# Patient Record
Sex: Male | Born: 1945
Health system: Southern US, Community
[De-identification: ages and names within clinical notes are randomized; demographics above are authoritative.]

## PROBLEM LIST (undated history)

## (undated) DIAGNOSIS — E119 Type 2 diabetes mellitus without complications: Secondary | ICD-10-CM

## (undated) DIAGNOSIS — T7840XA Allergy, unspecified, initial encounter: Secondary | ICD-10-CM

## (undated) DIAGNOSIS — F419 Anxiety disorder, unspecified: Secondary | ICD-10-CM

## (undated) DIAGNOSIS — K635 Polyp of colon: Secondary | ICD-10-CM

## (undated) DIAGNOSIS — H269 Unspecified cataract: Secondary | ICD-10-CM

## (undated) DIAGNOSIS — R42 Dizziness and giddiness: Secondary | ICD-10-CM

## (undated) DIAGNOSIS — R0789 Other chest pain: Secondary | ICD-10-CM

## (undated) DIAGNOSIS — N189 Chronic kidney disease, unspecified: Secondary | ICD-10-CM

## (undated) DIAGNOSIS — Z87898 Personal history of other specified conditions: Secondary | ICD-10-CM

## (undated) DIAGNOSIS — K219 Gastro-esophageal reflux disease without esophagitis: Secondary | ICD-10-CM

## (undated) DIAGNOSIS — L409 Psoriasis, unspecified: Secondary | ICD-10-CM

## (undated) DIAGNOSIS — M1990 Unspecified osteoarthritis, unspecified site: Secondary | ICD-10-CM

## (undated) DIAGNOSIS — E7800 Pure hypercholesterolemia, unspecified: Secondary | ICD-10-CM

## (undated) HISTORY — DX: Type 2 diabetes mellitus without complications: E11.9

## (undated) HISTORY — DX: Personal history of other specified conditions: Z87.898

## (undated) HISTORY — DX: Other chest pain: R07.89

## (undated) HISTORY — DX: Allergy, unspecified, initial encounter: T78.40XA

## (undated) HISTORY — DX: Polyp of colon: K63.5

## (undated) HISTORY — DX: Psoriasis, unspecified: L40.9

## (undated) HISTORY — DX: Chronic kidney disease, unspecified: N18.9

## (undated) HISTORY — PX: SINOSCOPY: SHX187

## (undated) HISTORY — DX: Pure hypercholesterolemia, unspecified: E78.00

## (undated) HISTORY — DX: Gastro-esophageal reflux disease without esophagitis: K21.9

## (undated) HISTORY — DX: Unspecified cataract: H26.9

## (undated) HISTORY — DX: Unspecified osteoarthritis, unspecified site: M19.90

## (undated) HISTORY — DX: Anxiety disorder, unspecified: F41.9

## (undated) HISTORY — PX: TONSILLECTOMY: SUR1361

## (undated) HISTORY — PX: COLONOSCOPY: SHX174

## (undated) HISTORY — PX: CATARACT EXTRACTION, BILATERAL: SHX1313

---
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
## 1987-09-01 HISTORY — PX: NASAL SEPTUM SURGERY: SHX37

## 1987-09-01 HISTORY — PX: INGUINAL HERNIA REPAIR: SUR1180

## 1999-03-11 ENCOUNTER — Encounter: Payer: Self-pay | Admitting: Pulmonary Disease

## 1999-03-11 ENCOUNTER — Ambulatory Visit (HOSPITAL_COMMUNITY): Admission: RE | Admit: 1999-03-11 | Discharge: 1999-03-11 | Payer: Self-pay | Admitting: Pulmonary Disease

## 2004-09-10 ENCOUNTER — Ambulatory Visit: Payer: Self-pay | Admitting: Pulmonary Disease

## 2005-02-11 ENCOUNTER — Ambulatory Visit: Payer: Self-pay | Admitting: Pulmonary Disease

## 2005-05-22 ENCOUNTER — Ambulatory Visit: Payer: Self-pay | Admitting: Pulmonary Disease

## 2005-09-09 ENCOUNTER — Ambulatory Visit: Payer: Self-pay | Admitting: Pulmonary Disease

## 2006-03-09 ENCOUNTER — Ambulatory Visit: Payer: Self-pay | Admitting: Pulmonary Disease

## 2006-09-02 ENCOUNTER — Ambulatory Visit: Payer: Self-pay | Admitting: Pulmonary Disease

## 2006-10-04 ENCOUNTER — Ambulatory Visit: Payer: Self-pay | Admitting: Pulmonary Disease

## 2006-10-04 LAB — CONVERTED CEMR LAB
ALT: 16 units/L (ref 0–40)
AST: 16 units/L (ref 0–37)
Albumin: 4.3 g/dL (ref 3.5–5.2)
Alkaline Phosphatase: 47 units/L (ref 39–117)
BUN: 21 mg/dL (ref 6–23)
Basophils Absolute: 0.1 10*3/uL (ref 0.0–0.1)
Basophils Relative: 1.2 % — ABNORMAL HIGH (ref 0.0–1.0)
Bilirubin Urine: NEGATIVE
Bilirubin, Direct: 0.1 mg/dL (ref 0.0–0.3)
CO2: 32 meq/L (ref 19–32)
Calcium: 9.5 mg/dL (ref 8.4–10.5)
Chloride: 107 meq/L (ref 96–112)
Cholesterol: 173 mg/dL (ref 0–200)
Creatinine, Ser: 0.8 mg/dL (ref 0.4–1.5)
Creatinine,U: 185.8 mg/dL
Eosinophils Absolute: 0.3 10*3/uL (ref 0.0–0.6)
Eosinophils Relative: 4.9 % (ref 0.0–5.0)
GFR calc Af Amer: 128 mL/min
GFR calc non Af Amer: 106 mL/min
Glucose, Bld: 131 mg/dL — ABNORMAL HIGH (ref 70–99)
HCT: 41.2 % (ref 39.0–52.0)
HDL: 48 mg/dL (ref >39.0)
Hemoglobin: 14.5 g/dL (ref 13.0–17.0)
Hgb A1c MFr Bld: 7 % — ABNORMAL HIGH (ref 4.6–6.0)
Ketones, ur: NEGATIVE mg/dL
LDL Cholesterol: 107 mg/dL — ABNORMAL HIGH (ref 0–99)
Leukocytes, UA: NEGATIVE
Lymphocytes Relative: 17.7 % (ref 12.0–46.0)
MCHC: 35.1 g/dL (ref 30.0–36.0)
MCV: 83.5 fL (ref 78.0–100.0)
Microalb Creat Ratio: 8.1 mg/g (ref 0.0–30.0)
Microalb, Ur: 1.5 mg/dL (ref 0.0–1.9)
Monocytes Absolute: 0.4 10*3/uL (ref 0.2–0.7)
Monocytes Relative: 7.2 % (ref 3.0–11.0)
Neutro Abs: 4.1 10*3/uL (ref 1.4–7.7)
Neutrophils Relative %: 69 % (ref 43.0–77.0)
Nitrite: NEGATIVE
PSA: 0.5 ng/mL (ref 0.10–4.00)
Platelets: 204 10*3/uL (ref 150–400)
Potassium: 4.2 meq/L (ref 3.5–5.1)
RBC: 4.94 M/uL (ref 4.22–5.81)
RDW: 13.6 % (ref 11.5–14.6)
Sodium: 142 meq/L (ref 135–145)
Specific Gravity, Urine: >=1.03 (ref 1.000–1.03)
TSH: 0.89 microintl units/mL (ref 0.35–5.50)
Total Bilirubin: 0.6 mg/dL (ref 0.3–1.2)
Total CHOL/HDL Ratio: 3.6
Total Protein, Urine: NEGATIVE mg/dL
Total Protein: 7.4 g/dL (ref 6.0–8.3)
Triglycerides: 89 mg/dL (ref 0–149)
Urine Glucose: NEGATIVE mg/dL
Urobilinogen, UA: 0.2 (ref 0.0–1.0)
VLDL: 18 mg/dL (ref 0–40)
WBC: 6 10*3/uL (ref 4.5–10.5)
pH: 6 (ref 5.0–8.0)

## 2007-10-28 ENCOUNTER — Encounter: Payer: Self-pay | Admitting: Pulmonary Disease

## 2008-02-23 DIAGNOSIS — Z860100 Personal history of colon polyps, unspecified: Secondary | ICD-10-CM | POA: Insufficient documentation

## 2008-02-23 DIAGNOSIS — E1165 Type 2 diabetes mellitus with hyperglycemia: Secondary | ICD-10-CM | POA: Insufficient documentation

## 2008-02-23 DIAGNOSIS — E1129 Type 2 diabetes mellitus with other diabetic kidney complication: Secondary | ICD-10-CM | POA: Insufficient documentation

## 2008-02-23 DIAGNOSIS — M1990 Unspecified osteoarthritis, unspecified site: Secondary | ICD-10-CM | POA: Insufficient documentation

## 2008-02-23 DIAGNOSIS — K219 Gastro-esophageal reflux disease without esophagitis: Secondary | ICD-10-CM | POA: Insufficient documentation

## 2008-02-23 DIAGNOSIS — F411 Generalized anxiety disorder: Secondary | ICD-10-CM | POA: Insufficient documentation

## 2008-02-23 DIAGNOSIS — L408 Other psoriasis: Secondary | ICD-10-CM | POA: Insufficient documentation

## 2008-02-24 ENCOUNTER — Ambulatory Visit: Payer: Self-pay | Admitting: Pulmonary Disease

## 2008-02-24 DIAGNOSIS — E7800 Pure hypercholesterolemia, unspecified: Secondary | ICD-10-CM | POA: Insufficient documentation

## 2008-02-25 DIAGNOSIS — R51 Headache: Secondary | ICD-10-CM | POA: Insufficient documentation

## 2008-02-25 DIAGNOSIS — R519 Headache, unspecified: Secondary | ICD-10-CM | POA: Insufficient documentation

## 2008-02-25 DIAGNOSIS — R0789 Other chest pain: Secondary | ICD-10-CM | POA: Insufficient documentation

## 2008-02-25 LAB — CONVERTED CEMR LAB
ALT: 15 units/L (ref 0–53)
AST: 17 units/L (ref 0–37)
Albumin: 4.3 g/dL (ref 3.5–5.2)
Alkaline Phosphatase: 53 units/L (ref 39–117)
BUN: 19 mg/dL (ref 6–23)
Basophils Absolute: 0.1 10*3/uL (ref 0.0–0.1)
Basophils Relative: 0.8 % (ref 0.0–1.0)
Bilirubin Urine: NEGATIVE
Bilirubin, Direct: 0.1 mg/dL (ref 0.0–0.3)
CO2: 27 meq/L (ref 19–32)
Calcium: 9.4 mg/dL (ref 8.4–10.5)
Chloride: 101 meq/L (ref 96–112)
Cholesterol: 143 mg/dL (ref 0–200)
Creatinine, Ser: 0.9 mg/dL (ref 0.4–1.5)
Creatinine,U: 239.4 mg/dL
Crystals: NEGATIVE
Eosinophils Absolute: 0.3 10*3/uL (ref 0.0–0.7)
Eosinophils Relative: 3.8 % (ref 0.0–5.0)
GFR calc Af Amer: 111 mL/min
GFR calc non Af Amer: 91 mL/min
Glucose, Bld: 116 mg/dL — ABNORMAL HIGH (ref 70–99)
HCT: 37.8 % — ABNORMAL LOW (ref 39.0–52.0)
HDL: 46.2 mg/dL (ref >39.0)
Hemoglobin, Urine: NEGATIVE
Hemoglobin: 13.2 g/dL (ref 13.0–17.0)
Hgb A1c MFr Bld: 7.8 % — ABNORMAL HIGH (ref 4.6–6.0)
LDL Cholesterol: 81 mg/dL (ref 0–99)
Leukocytes, UA: NEGATIVE
Lymphocytes Relative: 17.4 % (ref 12.0–46.0)
MCHC: 35.1 g/dL (ref 30.0–36.0)
MCV: 83.4 fL (ref 78.0–100.0)
Microalb Creat Ratio: 6.7 mg/g (ref 0.0–30.0)
Microalb, Ur: 1.6 mg/dL (ref 0.0–1.9)
Monocytes Absolute: 0.5 10*3/uL (ref 0.1–1.0)
Monocytes Relative: 6.8 % (ref 3.0–12.0)
Neutro Abs: 5 10*3/uL (ref 1.4–7.7)
Neutrophils Relative %: 71.2 % (ref 43.0–77.0)
Nitrite: NEGATIVE
PSA: 0.61 ng/mL (ref 0.10–4.00)
Platelets: 189 10*3/uL (ref 150–400)
Potassium: 4 meq/L (ref 3.5–5.1)
RBC: 4.53 M/uL (ref 4.22–5.81)
RDW: 13.3 % (ref 11.5–14.6)
Sodium: 137 meq/L (ref 135–145)
Specific Gravity, Urine: 1.025 (ref 1.000–1.03)
TSH: 0.77 microintl units/mL (ref 0.35–5.50)
Total Bilirubin: 0.9 mg/dL (ref 0.3–1.2)
Total CHOL/HDL Ratio: 3.1
Total Protein: 7.4 g/dL (ref 6.0–8.3)
Triglycerides: 78 mg/dL (ref 0–149)
Urine Glucose: NEGATIVE mg/dL
Urobilinogen, UA: 1 (ref 0.0–1.0)
VLDL: 16 mg/dL (ref 0–40)
WBC: 7.1 10*3/uL (ref 4.5–10.5)
pH: 6.5 (ref 5.0–8.0)

## 2008-05-28 ENCOUNTER — Ambulatory Visit: Payer: Self-pay | Admitting: Pulmonary Disease

## 2008-05-29 LAB — CONVERTED CEMR LAB
BUN: 20 mg/dL (ref 6–23)
CO2: 32 meq/L (ref 19–32)
Calcium: 9.1 mg/dL (ref 8.4–10.5)
Chloride: 112 meq/L (ref 96–112)
Creatinine, Ser: 0.9 mg/dL (ref 0.4–1.5)
GFR calc Af Amer: 111 mL/min
GFR calc non Af Amer: 91 mL/min
Glucose, Bld: 111 mg/dL — ABNORMAL HIGH (ref 70–99)
Hgb A1c MFr Bld: 7.3 % — ABNORMAL HIGH (ref 4.6–6.0)
Potassium: 4.2 meq/L (ref 3.5–5.1)
Sodium: 144 meq/L (ref 135–145)

## 2008-10-03 ENCOUNTER — Ambulatory Visit: Payer: Self-pay | Admitting: Pulmonary Disease

## 2008-10-03 LAB — CONVERTED CEMR LAB
ALT: 16 units/L (ref 0–53)
AST: 18 units/L (ref 0–37)
Albumin: 4.4 g/dL (ref 3.5–5.2)
Alkaline Phosphatase: 40 units/L (ref 39–117)
BUN: 18 mg/dL (ref 6–23)
Bilirubin, Direct: 0.1 mg/dL (ref 0.0–0.3)
CO2: 29 meq/L (ref 19–32)
Calcium: 9.6 mg/dL (ref 8.4–10.5)
Chloride: 102 meq/L (ref 96–112)
Cholesterol: 153 mg/dL (ref 0–200)
Creatinine, Ser: 1 mg/dL (ref 0.4–1.5)
GFR calc Af Amer: 98 mL/min
GFR calc non Af Amer: 81 mL/min
Glucose, Bld: 114 mg/dL — ABNORMAL HIGH (ref 70–99)
HDL: 49.1 mg/dL (ref >39.0)
Hgb A1c MFr Bld: 7.1 % — ABNORMAL HIGH (ref 4.6–6.0)
LDL Cholesterol: 87 mg/dL (ref 0–99)
Potassium: 4.2 meq/L (ref 3.5–5.1)
Sodium: 139 meq/L (ref 135–145)
Total Bilirubin: 0.7 mg/dL (ref 0.3–1.2)
Total CHOL/HDL Ratio: 3.1
Total Protein: 7.5 g/dL (ref 6.0–8.3)
Triglycerides: 84 mg/dL (ref 0–149)
VLDL: 17 mg/dL (ref 0–40)

## 2008-11-16 ENCOUNTER — Encounter: Payer: Self-pay | Admitting: Pulmonary Disease

## 2009-04-01 ENCOUNTER — Ambulatory Visit: Payer: Self-pay | Admitting: Pulmonary Disease

## 2009-04-12 LAB — CONVERTED CEMR LAB
ALT: 14 units/L (ref 0–53)
AST: 14 units/L (ref 0–37)
Albumin: 4.2 g/dL (ref 3.5–5.2)
Alkaline Phosphatase: 47 units/L (ref 39–117)
BUN: 25 mg/dL — ABNORMAL HIGH (ref 6–23)
Basophils Absolute: 0.1 10*3/uL (ref 0.0–0.1)
Basophils Relative: 0.8 % (ref 0.0–3.0)
Bilirubin, Direct: 0.1 mg/dL (ref 0.0–0.3)
CO2: 30 meq/L (ref 19–32)
Calcium: 9.5 mg/dL (ref 8.4–10.5)
Chloride: 106 meq/L (ref 96–112)
Cholesterol: 174 mg/dL (ref 0–200)
Creatinine, Ser: 0.9 mg/dL (ref 0.4–1.5)
Creatinine,U: 160.5 mg/dL
Eosinophils Absolute: 0.3 10*3/uL (ref 0.0–0.7)
Eosinophils Relative: 4.4 % (ref 0.0–5.0)
GFR calc non Af Amer: 91.11 mL/min (ref >60)
Glucose, Bld: 145 mg/dL — ABNORMAL HIGH (ref 70–99)
HCT: 36.5 % — ABNORMAL LOW (ref 39.0–52.0)
HDL: 44.9 mg/dL (ref >39.00)
Hemoglobin: 12.9 g/dL — ABNORMAL LOW (ref 13.0–17.0)
Hgb A1c MFr Bld: 7.6 % — ABNORMAL HIGH (ref 4.6–6.5)
LDL Cholesterol: 113 mg/dL — ABNORMAL HIGH (ref 0–99)
Lymphocytes Relative: 18.3 % (ref 12.0–46.0)
Lymphs Abs: 1.3 10*3/uL (ref 0.7–4.0)
MCHC: 35.2 g/dL (ref 30.0–36.0)
MCV: 84.4 fL (ref 78.0–100.0)
Microalb Creat Ratio: 9.3 mg/g (ref 0.0–30.0)
Microalb, Ur: 1.5 mg/dL (ref 0.0–1.9)
Monocytes Absolute: 0.5 10*3/uL (ref 0.1–1.0)
Monocytes Relative: 6.8 % (ref 3.0–12.0)
Neutro Abs: 4.8 10*3/uL (ref 1.4–7.7)
Neutrophils Relative %: 69.7 % (ref 43.0–77.0)
PSA: 0.73 ng/mL (ref 0.10–4.00)
Platelets: 197 10*3/uL (ref 150.0–400.0)
Potassium: 4.5 meq/L (ref 3.5–5.1)
RBC: 4.32 M/uL (ref 4.22–5.81)
RDW: 13.6 % (ref 11.5–14.6)
Sodium: 144 meq/L (ref 135–145)
TSH: 0.66 microintl units/mL (ref 0.35–5.50)
Total Bilirubin: 0.7 mg/dL (ref 0.3–1.2)
Total CHOL/HDL Ratio: 4
Total Protein: 7.7 g/dL (ref 6.0–8.3)
Triglycerides: 83 mg/dL (ref 0.0–149.0)
VLDL: 16.6 mg/dL (ref 0.0–40.0)
WBC: 7 10*3/uL (ref 4.5–10.5)

## 2009-05-29 ENCOUNTER — Encounter (INDEPENDENT_AMBULATORY_CARE_PROVIDER_SITE_OTHER): Payer: Self-pay | Admitting: *Deleted

## 2009-08-16 ENCOUNTER — Encounter: Payer: Self-pay | Admitting: Pulmonary Disease

## 2009-09-20 ENCOUNTER — Ambulatory Visit: Payer: Self-pay | Admitting: Pulmonary Disease

## 2009-09-21 LAB — CONVERTED CEMR LAB
BUN: 19 mg/dL (ref 6–23)
CO2: 29 meq/L (ref 19–32)
Calcium: 9.4 mg/dL (ref 8.4–10.5)
Chloride: 104 meq/L (ref 96–112)
Cholesterol: 150 mg/dL (ref 0–200)
Creatinine, Ser: 1 mg/dL (ref 0.4–1.5)
GFR calc non Af Amer: 80.56 mL/min (ref >60)
Glucose, Bld: 116 mg/dL — ABNORMAL HIGH (ref 70–99)
HDL: 46.3 mg/dL (ref >39.00)
Hgb A1c MFr Bld: 7.4 % — ABNORMAL HIGH (ref 4.6–6.5)
LDL Cholesterol: 85 mg/dL (ref 0–99)
Potassium: 4.2 meq/L (ref 3.5–5.1)
Sodium: 139 meq/L (ref 135–145)
Total CHOL/HDL Ratio: 3
Triglycerides: 95 mg/dL (ref 0.0–149.0)
VLDL: 19 mg/dL (ref 0.0–40.0)

## 2009-10-22 ENCOUNTER — Telehealth: Payer: Self-pay | Admitting: Pulmonary Disease

## 2010-02-28 ENCOUNTER — Telehealth (INDEPENDENT_AMBULATORY_CARE_PROVIDER_SITE_OTHER): Payer: Self-pay | Admitting: *Deleted

## 2010-04-04 ENCOUNTER — Ambulatory Visit: Payer: Self-pay | Admitting: Pulmonary Disease

## 2010-04-06 LAB — CONVERTED CEMR LAB
ALT: 13 units/L (ref 0–53)
AST: 15 units/L (ref 0–37)
Albumin: 4.3 g/dL (ref 3.5–5.2)
Alkaline Phosphatase: 47 units/L (ref 39–117)
BUN: 24 mg/dL — ABNORMAL HIGH (ref 6–23)
Basophils Absolute: 0 10*3/uL (ref 0.0–0.1)
Basophils Relative: 0.6 % (ref 0.0–3.0)
Bilirubin, Direct: 0.1 mg/dL (ref 0.0–0.3)
CO2: 30 meq/L (ref 19–32)
Calcium: 9.6 mg/dL (ref 8.4–10.5)
Chloride: 106 meq/L (ref 96–112)
Cholesterol: 142 mg/dL (ref 0–200)
Creatinine, Ser: 0.9 mg/dL (ref 0.4–1.5)
Eosinophils Absolute: 0.3 10*3/uL (ref 0.0–0.7)
Eosinophils Relative: 4.9 % (ref 0.0–5.0)
GFR calc non Af Amer: 95.7 mL/min (ref >60)
Glucose, Bld: 134 mg/dL — ABNORMAL HIGH (ref 70–99)
HCT: 39 % (ref 39.0–52.0)
HDL: 39.3 mg/dL (ref >39.00)
Hemoglobin: 13.5 g/dL (ref 13.0–17.0)
Hgb A1c MFr Bld: 7.4 % — ABNORMAL HIGH (ref 4.6–6.5)
LDL Cholesterol: 84 mg/dL (ref 0–99)
Lymphocytes Relative: 16.6 % (ref 12.0–46.0)
Lymphs Abs: 1.1 10*3/uL (ref 0.7–4.0)
MCHC: 34.6 g/dL (ref 30.0–36.0)
MCV: 84.5 fL (ref 78.0–100.0)
Monocytes Absolute: 0.6 10*3/uL (ref 0.1–1.0)
Monocytes Relative: 8.3 % (ref 3.0–12.0)
Neutro Abs: 4.6 10*3/uL (ref 1.4–7.7)
Neutrophils Relative %: 69.6 % (ref 43.0–77.0)
PSA: 1.31 ng/mL (ref 0.10–4.00)
Platelets: 193 10*3/uL (ref 150.0–400.0)
Potassium: 5.1 meq/L (ref 3.5–5.1)
RBC: 4.61 M/uL (ref 4.22–5.81)
RDW: 15.3 % — ABNORMAL HIGH (ref 11.5–14.6)
Sodium: 142 meq/L (ref 135–145)
TSH: 0.65 microintl units/mL (ref 0.35–5.50)
Total Bilirubin: 0.4 mg/dL (ref 0.3–1.2)
Total CHOL/HDL Ratio: 4
Total Protein: 7.3 g/dL (ref 6.0–8.3)
Triglycerides: 95 mg/dL (ref 0.0–149.0)
VLDL: 19 mg/dL (ref 0.0–40.0)
WBC: 6.7 10*3/uL (ref 4.5–10.5)

## 2010-09-25 ENCOUNTER — Encounter: Payer: Self-pay | Admitting: Pulmonary Disease

## 2010-10-02 ENCOUNTER — Encounter: Payer: Self-pay | Admitting: Pulmonary Disease

## 2010-10-02 ENCOUNTER — Other Ambulatory Visit: Payer: Self-pay | Admitting: Pulmonary Disease

## 2010-10-02 ENCOUNTER — Ambulatory Visit (INDEPENDENT_AMBULATORY_CARE_PROVIDER_SITE_OTHER): Payer: BC Managed Care – PPO | Admitting: Pulmonary Disease

## 2010-10-02 ENCOUNTER — Other Ambulatory Visit: Payer: BC Managed Care – PPO

## 2010-10-02 ENCOUNTER — Ambulatory Visit: Admit: 2010-10-02 | Payer: Self-pay | Admitting: Pulmonary Disease

## 2010-10-02 ENCOUNTER — Ambulatory Visit (INDEPENDENT_AMBULATORY_CARE_PROVIDER_SITE_OTHER)
Admission: RE | Admit: 2010-10-02 | Discharge: 2010-10-02 | Disposition: A | Discharge status: Home / Self Care | Payer: BC Managed Care – PPO | Source: Ambulatory Visit | Attending: Pulmonary Disease | Admitting: Pulmonary Disease

## 2010-10-02 DIAGNOSIS — R0789 Other chest pain: Secondary | ICD-10-CM

## 2010-10-02 DIAGNOSIS — Z86010 Personal history of colonic polyps: Secondary | ICD-10-CM

## 2010-10-02 DIAGNOSIS — F411 Generalized anxiety disorder: Secondary | ICD-10-CM

## 2010-10-02 DIAGNOSIS — E7800 Pure hypercholesterolemia, unspecified: Secondary | ICD-10-CM

## 2010-10-02 DIAGNOSIS — K219 Gastro-esophageal reflux disease without esophagitis: Secondary | ICD-10-CM

## 2010-10-02 DIAGNOSIS — L408 Other psoriasis: Secondary | ICD-10-CM

## 2010-10-02 DIAGNOSIS — E119 Type 2 diabetes mellitus without complications: Secondary | ICD-10-CM

## 2010-10-02 DIAGNOSIS — Z0000 Encounter for general adult medical examination without abnormal findings: Secondary | ICD-10-CM

## 2010-10-02 DIAGNOSIS — M1990 Unspecified osteoarthritis, unspecified site: Secondary | ICD-10-CM

## 2010-10-02 LAB — CBC WITH DIFFERENTIAL/PLATELET
Basophils Absolute: 0 10*3/uL (ref 0.0–0.1)
Eosinophils Absolute: 0.3 10*3/uL (ref 0.0–0.7)
HCT: 39.2 % (ref 39.0–52.0)
Hemoglobin: 13.6 g/dL (ref 13.0–17.0)
Lymphs Abs: 1.1 10*3/uL (ref 0.7–4.0)
MCHC: 34.7 g/dL (ref 30.0–36.0)
MCV: 83.6 fl (ref 78.0–100.0)
Neutro Abs: 4.3 10*3/uL (ref 1.4–7.7)
RDW: 14.8 % — ABNORMAL HIGH (ref 11.5–14.6)

## 2010-10-02 LAB — BASIC METABOLIC PANEL
CO2: 29 mEq/L (ref 19–32)
Calcium: 9.5 mg/dL (ref 8.4–10.5)
GFR: 76.73 mL/min (ref >60.00)
Sodium: 135 mEq/L (ref 135–145)

## 2010-10-02 LAB — HEMOGLOBIN A1C: Hgb A1c MFr Bld: 8.9 % — ABNORMAL HIGH (ref 4.6–6.5)

## 2010-10-02 LAB — HEPATIC FUNCTION PANEL
Alkaline Phosphatase: 53 U/L (ref 39–117)
Bilirubin, Direct: 0.1 mg/dL (ref 0.0–0.3)
Total Protein: 7.1 g/dL (ref 6.0–8.3)

## 2010-10-02 LAB — TSH: TSH: 0.67 u[IU]/mL (ref 0.35–5.50)

## 2010-10-02 NOTE — Progress Notes (Signed)
Summary: COUGH  Phone Note Call from Patient Call back at [phone]   Caller: Patient Call For: Rubert Frediani Summary of Call: PT COUGHING [facility] [address] Initial call taken by: Rickard Patience,  October 22, 2009 9:21 AM  Follow-up for Phone Call        called and spoke with pt. Pt recently saw SN 09-20-2009.  pt c/o coughing up yellow sputum and clear nasal drainage. pt states he had a fever yesterday but denied fever today.  pt denied body aches.  pt states symptoms started 2 days ago.  Pt is taking Zicam and Nyquil with no relief of symptoms.  Please advise.  Thank you.    allergies:  Erythromycin.  Aundra Millet Reynolds LPN  October 22, 2009 9:32 AM   Additional Follow-up for Phone Call Additional follow up Details #1::        per SN---augmentin 875mg   #14  1 by mouth two times a day and use mucinex otc  1-2 by mouth two times a day with plenty of fluids.  use tylenol as needed .  thanks Randell Loop CMA  October 22, 2009 9:57 AM     Additional Follow-up for Phone Call Additional follow up Details #2::    Pt advised of recs. rx sent.Carron Curie CMA  October 22, 2009 10:17 AM   New/Updated Medications: AUGMENTIN 875-125 MG TABS (AMOXICILLIN-POT CLAVULANATE) Take 1 tablet by mouth two times a day Prescriptions: AUGMENTIN 875-125 MG TABS (AMOXICILLIN-POT CLAVULANATE) Take 1 tablet by mouth two times a day  #14 x 0   Entered by:   Carron Curie CMA   Authorized by:   Michele Mcalpine MD   Signed by:   Carron Curie CMA on 10/22/2009   Method used:   Electronically to        [facility] [phone]* (retail)       [address]       [place], [place]  [address]       Ph: [phone]       Fax: [phone]   RxID:   [id number]

## 2010-10-02 NOTE — Letter (Signed)
Summary: Diabetic Eval/[facility]  Diabetic Eval/[facility]   Imported By: Sherian Rein 09/11/2009 15:05:07  _____________________________________________________________________  External Attachment:    Type:   Image     Comment:   External Document

## 2010-10-02 NOTE — Assessment & Plan Note (Signed)
Summary: rov 6 months///kp   CC:  6 month ROV & review of mult medical problems....  History of Present Illness: 65 y/o WM here for a follow up visit... he has multiple medical problems as noted below...   ~  Jun09:  he was last seen 2/08 for f/u of DM, Hyperchol, etc... states he's been feeling well without any new complaints or concerns... but A1c was 7.8 so we increased his Metformin to 1000 mg Bid and rec diet/ exercise/ get weight down...    ~  April 01, 2009:  he's had a good 68mo but couldn't tolerate the Glimepiride due to hypoglycemia w/ BS down to 57 even on 1/2 of the 1mg  tab... trying to diet but not exercising enough & weight is stable 238#... he saw DrNorris w/ right tennis elbow & given another injection- helped for awhile, may need surgery, we discussed elbow support...  Onglyza added for A1c= 7.6   ~  September 20, 2009:  he's had a good 68mo & good Christmas holiday- weight down 5#, and home sugars  in the 115-155 range (BS=116, A1c=7.4)... feeling well w/o new complaints or concerns...   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 on diet, incr exercise & weight down 3# to 229#, but he notes home BS sl higher in the 150's he says... his CC is some sinus drainage & we discussed trying OTC antihist in AM & FLONASE Qhs...    Current Problem List:  Health Maintenance - had Pneumovax 2009, and Tetanus shot 2007... he gets the Flu shot each fall... he takes ASA 81mg  but cut back to 2-3 per week due to bruising...   Hx of CHEST PAIN, ATYPICAL (ICD-786.59) - on ASA 81mg /d... eval 2006 due to risk factors: ex-smoker, DM, +FamHx, Chol... NuclearStressTest 1/06 was neg- no ischemia, no infarct, EF= 67%...  HYPERCHOLESTEROLEMIA (ICD-272.0) - on SIMVASTATIN 40mg /d...  ~  FLP 2/08 showed TChol 173, TG 89, HDL 48, LDL 107  ~  FLP 02/24/08 showed TChol 143, TG 78, HDL 46, LDL 81  ~  FLP 2/10 showed TChol 153, TG 84, HDL 119, LDL 87  ~   FLP 8/10 showed TChol 174, TG 83, HDL 45, LDL 113... needs better diet, same med.  ~  FLP 1/11 showed TChol 150, TG 95, HDL 46, LDL 85  ~  FLP 8/11 showed TChol 142, TG 95, HDL 39, LDL 84  DM (ICD-250.00) - on METFORMIN 1000mg Bid, ACTOS 45mg /d, ONGLYZA 5mg /d, & LISINOPRIL 10mg /d... weight down 3# to 229# on diet and exercise program... tried Glimepiride 1mg  in 2010 but intol w/ low sugars reported.  ~  labs 2/08 showed BS= 131, HgA1c= 7.0.Marland KitchenMarland Kitchen  ~  labs 02/24/08  BS= 116, HgA1c= 7.8.Marland KitchenMarland Kitchen Creat is 0.9- therefore incr Metformin to 1000Bid...  ~  labs 9/09 showed BS= 111, HgA1c= 7.3.Marland KitchenMarland Kitchen Glimepiride 1mg /d added...  ~  labs 2/10 showed BS= 114, A1c= 7.1.Marland KitchenMarland Kitchen continue meds + better diet (he stopped Glimep due to low BS)  ~  labs 8/10 showed BS= 145, A1c= 7.6.Marland Kitchen. rec> add ONGLYZA 5mg /d...  ~  had Ophthalmology check 12/10 by DrMcFarland- no retinopathy.  ~  labs 1/11 showed BS= 116, A1c= 7.4  ~  labs 8/11 showed BS= 134, A1c= 7.4  GERD (ICD-530.81) - uses OTC meds as needed...  COLONIC POLYPS, HX OF (ICD-V12.72) - last colonoscopy 10/03 showed 1 sm polyp= 2mm hyperplastic lesion, therefore f/u planned 35yrs.Marland KitchenMarland Kitchen  DEGENERATIVE JOINT DISEASE (ICD-715.90) - recent shot for tennis elbow... eval left shoulder pain 2/08 by DrNorris- adhesive capsulitis, improved after shot... hx right frozen shoulder in past, improved w/ PT...  Hx of HEADACHE (ICD-784.0) - hx cervicogenic HA's in 2001- eval by DrAdelman...  ANXIETY (ICD-300.00)  PSORIASIS (ICD-696.1) - uses Ultravate cream... followed by Elnora Morrison and doing well.   Preventive Screening-Counseling & Management  Alcohol-Tobacco     Smoking Status: quit     Year Quit: 1983  Allergies: 1)  Erythromycin (Erythromycin)  Comments:  Nurse/Medical Assistant: The patient's medications and allergies were reviewed with the patient and were updated in the Medication and Allergy Lists.  Past History:  Past Medical History: Hx of CHEST PAIN, ATYPICAL  (ICD-786.59) HYPERCHOLESTEROLEMIA (ICD-272.0) DM (ICD-250.00) GERD (ICD-530.81) COLONIC POLYPS, HX OF (ICD-V12.72) DEGENERATIVE JOINT DISEASE (ICD-715.90) Hx of HEADACHE (ICD-784.0) ANXIETY (ICD-300.00) PSORIASIS (ICD-696.1)  Past Surgical History: S/P left inguinal hernia repair in 1989  Family History: Reviewed history from 04/01/2009 and no changes required. Father died age 47 w/ Alzheimer's dis Mother died age 62 w/ lung cancer (Nellie Pickard) 1 Sibling: sister died age 65 w/ Leukemia  Social History: Reviewed history from 04/01/2009 and no changes required. 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    Vital Signs:  Patient profile:   65 year old male Height:      72 inches Weight:      229.13 pounds BMI:     31.19 O2 Sat:      99 % on Room air Temp:     97.2 degrees F oral Pulse rate:   69 / minute BP sitting:   120 / 78  (left arm) Cuff size:   regular  Vitals Entered By: Randell Loop CMA (April 04, 2010 9:23 AM)  O2 Sat at Rest %:  99 O2 Flow:  Room air CC: 6 month ROV & review of mult medical problems... Is Patient Diabetic? Yes Pain Assessment Patient in pain? no      Comments no changes in meds today   Physical Exam  Additional Exam:  WD, Overweight, 65 y/o WM in NAD... GENERAL:  Alert & oriented; pleasant & cooperative... HEENT:  [place]/AT, EOM-wnl, PERRLA, EACs-clear, TMs-wnl, NOSE-clear,  THROAT-clear & wnl. NECK:  Supple w/ fairROM; no JVD; normal carotid impulses w/o bruits; no thyromegaly or nodules palpated; no lymphadenopathy. CHEST:  Clear to P & A; without wheezes/ rales/ or rhonchi heard... HEART:  Regular Rhythm; without murmurs/ rubs/ or gallops detected... ABDOMEN:  Soft & nontender; normal bowel sounds; no organomegaly or masses palpated... EXT: without deformities, mild arthritic changes; no varicose veins/ venous insuffic/ or edema. NEURO:  CN's intact; motor testing normal; sensory testing normal; gait normal & balance OK. DERM:  No lesions noted; he has mild psoriasis...    MISC. Report  Procedure date:  04/04/2010  Findings:      BMP (METABOL)   Sodium                    142 mEq/L  135-145   Potassium                 5.1 mEq/L                   3.5-5.1   Chloride                  106 mEq/L                   96-112   Carbon Dioxide            30 mEq/L                    19-32   Glucose              [H]  134 mg/dL                   57-84   BUN                  [H]  24 mg/dL                    6-96   Creatinine                0.9 mg/dL                   2.9-5.2   Calcium                   9.6 mg/dL                   8.4-13.2   GFR                       95.70 mL/min                >60  Hepatic/Liver Function Panel (HEPATIC)   Total Bilirubin           0.4 mg/dL                   4.4-0.1   Direct Bilirubin          0.1 mg/dL                   0.2-7.2   Alkaline Phosphatase      47 U/L                      39-117   AST                       15 U/L                      0-37   ALT                       13 U/L                      0-53   Total Protein             7.3 g/dL                    5.3-6.6   Albumin                   4.3 g/dL  3.5-5.2  CBC Platelet w/Diff (CBCD)   White Cell Count          6.7 K/uL                    4.5-10.5   Red Cell Count            4.61 Mil/uL                 4.22-5.81   Hemoglobin                 13.5 g/dL                   57.8-46.9   Hematocrit                39.0 %                      39.0-52.0   MCV                       84.5 fl                     78.0-100.0   Platelet Count            193.0 K/uL                  150.0-400.0   Neutrophil %              69.6 %                      43.0-77.0   Lymphocyte %              16.6 %                      12.0-46.0   Monocyte %                8.3 %                       3.0-12.0   Eosinophils%              4.9 %                       0.0-5.0   Basophils %               0.6 %                       0.0-3.0  Comments:      Lipid Panel (LIPID)   Cholesterol               142 mg/dL                   6-295   Triglycerides             95.0 mg/dL                  2.8-413.2   HDL                       44.01 mg/dL                 >02.72   LDL Cholesterol           84 mg/dL  0-99  TSH (TSH)   FastTSH                   0.65 uIU/mL                 0.35-5.50  Hemoglobin A1C (A1C)   Hemoglobin A1C       [H]  7.4 %                       4.6-6.5  Prostate Specific Antigen (PSA)   PSA-Hyb                   1.31 ng/mL                  0.10-4.00   Impression & Recommendations:  Problem # 1:  Hx of CHEST PAIN, ATYPICAL (ICD-786.59) No recurrent CP, no angina, etc... he remains on ASA but decr to 2-3 per week due to bruising...  Problem # 2:  HYPERCHOLESTEROLEMIA (ICD-272.0) Controlled on Simva40... continue same. His updated medication list for this problem includes:    Simvastatin 40 Mg Tabs (Simvastatin) .Marland Kitchen... Take 1 tablet by mouth at bedtime...  Orders: TLB-BMP (Basic Metabolic Panel-BMET) (80048-METABOL) TLB-Hepatic/Liver Function Pnl (80076-HEPATIC) TLB-CBC Platelet - w/Differential (85025-CBCD) TLB-Lipid Panel (80061-LIPID) TLB-TSH (Thyroid Stimulating Hormone) (84443-TSH) TLB-A1C / Hgb A1C (Glycohemoglobin) (83036-A1C) TLB-PSA (Prostate Specific Antigen) (84153-PSA)  Problem # 3:  DM  (ICD-250.00) Borderline control but intol to even low dose of Glimep in the past... continue meds as below. His updated medication list for this problem includes:    Aspirin Low Dose 81 Mg Tabs (Aspirin) .Marland Kitchen... Take 1 tablet by mouth 3 times a week    Lisinopril 10 Mg Tabs (Lisinopril) .Marland Kitchen... Take 1 tablet by mouth once a day    Metformin Hcl 500 Mg Tabs (Metformin hcl) .Marland Kitchen... Take two tablets by mouth two times a day    Actos 45 Mg Tabs (Pioglitazone hcl) .Marland Kitchen... Take 1 tablet by mouth once a day    Onglyza 5 Mg Tabs (Saxagliptin hcl) .Marland Kitchen... Take 1 tablet by mouth once a day  Problem # 4:  COLONIC POLYPS, HX OF (ICD-V12.72) GI is stable and still up to date w/ f/u colon due 10/13...  Problem # 5:  DEGENERATIVE JOINT DISEASE (ICD-715.90) He is doing well w/ OTC meds Prn... no new complaints or concerns. His updated medication list for this problem includes:    Aspirin Low Dose 81 Mg Tabs (Aspirin) .Marland Kitchen... Take 1 tablet by mouth 3 times a week  Problem # 6:  OTHER MEDICAL PROBLEMS AS NOTED>>>  Complete Medication List: 1)  Aspirin Low Dose 81 Mg Tabs (Aspirin) .... Take 1 tablet by mouth 3 times a week 2)  Lisinopril 10 Mg Tabs (Lisinopril) .... Take 1 tablet by mouth once a day 3)  Simvastatin 40 Mg Tabs (Simvastatin) .... Take 1 tablet by mouth at bedtime.Marland KitchenMarland Kitchen 4)  Metformin Hcl 500 Mg Tabs (Metformin hcl) .... Take two tablets by mouth two times a day 5)  Actos 45 Mg Tabs (Pioglitazone hcl) .... Take 1 tablet by mouth once a day 6)  Onglyza 5 Mg Tabs (Saxagliptin hcl) .... Take 1 tablet by mouth once a day 7)  Viagra 100 Mg Tabs (Sildenafil citrate) .... Use as directed... 8)  Fluticasone Propionate 50 Mcg/act Susp (Fluticasone propionate) .Marland Kitchen.. 1-2 sprays in each nostril at bedtime...  Patient Instructions: 1)  Today we updated your med list- see below.... 2)  Continue your current  meds the same... 3)  Today we did your folow up FASTING blood work... please call the "phone tree" in a few days  for your lab results.Marland KitchenMarland Kitchen 4)  Keep up the good job w/ diet + exercise... 5)  Call for any problems.Marland KitchenMarland Kitchen 6)  Please schedule a follow-up appointment in 6 months. Prescriptions: FLUTICASONE PROPIONATE 50 MCG/ACT SUSP (FLUTICASONE PROPIONATE) 1-2 sprays in each nostril at bedtime...  #1 x prn   Entered and Authorized by:   Michele Mcalpine MD   Signed by:   Michele Mcalpine MD on 04/04/2010   Method used:   Print then Give to Patient   RxID:   [id number]    Immunization History:  Influenza Immunization History:    Influenza:  historical (07/16/2009)

## 2010-10-02 NOTE — Assessment & Plan Note (Signed)
Summary: rov/apc   CC:  6 month ROV & review of mult medical problems....  History of Present Illness: 65 y/o WM here for a follow up visit... he has multiple medical problems as noted below...    ~  Jun09:  he was last seen 2/08 for f/u of DM, Hyperchol, etc... states he's been feeling well without any new complaints or concerns... but A1c was 7.8 so we increased his Metformin to 1000 mg Bid and rec diet/ exercise/ get weight down...   ~  Sep09:  wt up 4# despite best efforts and BS= 111, A1c= 7.3.Marland KitchenMarland Kitchen therefore Glimepiride 1mg  added...    ~  October 03, 2008:  he had a good holiday... unfortunately weight up 9# to 239# today... despite this his BS at home has been fairly stable and he's worried about low sugars if he misses lunch... discussed need for diet/ exercise/ get weight down!!!   ~  April 01, 2009:  he's had a good 54mo but couldn't tolerate the Glimepiride due to hypoglycemia w/ BS down to 57 even on 1/2 of the 1mg  tab... trying to diet but not exercising enough & weight is stable 238#... he saw DrNorris w/ right tennis elbow & given another injection- helped for awhile, may need surgery, we discussed elbow support...  Onglyza added for A1c= 7.6   ~  September 20, 2009:  he's had a good 54mo & good Christmas holiday- weight down 5#, and home sugars  in the 115-155 range... feeling well w/o new complaints or concerns...    Current Problem List:  Health Maintenance - works @ [employer]... had Pneumovax 2009, and Tetanus shot 2007... also takes ASA 81mg /d... got Flu shot at work 10/10.  Hx of CHEST PAIN, ATYPICAL (ICD-786.59) - on ASA 81mg /d... eval 2006 due to risk factors: ex-smoker, DM, +FamHx, Chol... NuclearStressTest 1/06 was neg- no ischemia, no infarct, EF= 67%...  HYPERCHOLESTEROLEMIA (ICD-272.0) - on SIMVASTATIN 40mg /d...  ~  FLP 2/08 showed TChol 173, TG 89, HDL 48, LDL 107  ~  FLP 02/24/08 showed TChol 143, TG 78, HDL 46, LDL 81  ~  FLP 2/10 showed TChol 153, TG 84,  HDL 119, LDL 87  ~  FLP 8/10 showed TChol 174, TG 83, HDL 45, LDL 113... needs better diet, same med.  ~  FLP 1/11 showed TChol 150, TG 95, HDL 46, LDL 85  DM (ICD-250.00) - on METFORMIN 1000mg Bid, ACTOS 45mg /d, ONGLYZA 5mg /d, & LISINOPRIL 10mg /d... weight down 5# to 232# on diet and exercise program... tried Glimepiride 1mg  in 2010 but intol w/ low sugars reported.  ~  labs 2/08 showed BS= 131, HgA1c= 7.0.Marland KitchenMarland Kitchen  ~  labs 02/24/08  BS= 116, HgA1c= 7.8.Marland KitchenMarland Kitchen Creat is 0.9- therefore incr Metformin to 1000Bid...  ~  labs 9/09 showed BS= 111, HgA1c= 7.3.Marland KitchenMarland Kitchen Glimepiride 1mg /d added...  ~  labs 2/10 showed BS= 114, A1c= 7.1.Marland KitchenMarland Kitchen continue meds + better diet (he stopped Glimep due to low BS)  ~  labs 8/10 showed BS= 145, A1c= 7.6.Marland Kitchen. rec> add ONGLYZA 5mg /d...  ~  had Ophthalmology check 12/10 by DrMcFarland- no retinopathy.  ~  labs 1/11 showed BS= 116, A1c= 7.4  GERD (ICD-530.81) - uses OTC meds as needed...  COLONIC POLYPS, HX OF (ICD-V12.72) - last colonoscopy 10/03 showed 1 sm polyp= 2mm hyperplastic lesion, therefore f/u planned 34yrs...  DEGENERATIVE JOINT DISEASE (ICD-715.90) - recent shot for tennis elbow... eval left shoulder pain 2/08 by DrNorris- adhesive capsulitis, improved after shot... hx right frozen  shoulder in past, improved w/ PT...  Hx of HEADACHE (ICD-784.0) - hx cervicogenic HA's in 2001- eval by DrAdelman...  ANXIETY (ICD-300.00)  PSORIASIS (ICD-696.1) - uses Ultravate cream... followed by Elnora Morrison and doing well.    Allergies: 1)  Erythromycin (Erythromycin)  Comments:  Nurse/Medical Assistant: The patient's medications and allergies were reviewed with the patient and were updated in the Medication and Allergy Lists.  Past History:  Past Medical History:  Hx of CHEST PAIN, ATYPICAL (ICD-786.59) HYPERCHOLESTEROLEMIA (ICD-272.0) DM (ICD-250.00) GERD (ICD-530.81) COLONIC POLYPS, HX OF (ICD-V12.72) DEGENERATIVE JOINT DISEASE (ICD-715.90) Hx of HEADACHE  (ICD-784.0) ANXIETY (ICD-300.00) PSORIASIS (ICD-696.1)  Past Surgical History: S/P left inguinal hernia repair in 1989  Family History: Reviewed history from 04/01/2009 and no changes required. Father died age 48 w/ Alzheimer's dis Mother died age 44 w/ lung cancer (Nellie Pickard) 1 Sibling: sister died age 75 w/ Leukemia  Social History: Reviewed history from 04/01/2009 and no changes required. 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    Vital Signs:  Patient profile:   65 year old male Height:      72 inches Weight:      232.13 pounds BMI:     31.60 O2 Sat:      98 % on Room air Temp:     97.9 degrees F oral Pulse rate:   62 / minute BP sitting:   126 / 80  (left arm) Cuff size:   regular  Vitals Entered By: Randell Loop CMA (September 20, 2009 9:31 AM)  O2 Sat at Rest %:  98 O2 Flow:  Room air CC: 6 month ROV & review of mult medical problems... Is Patient Diabetic? Yes Pain Assessment Patient in pain? no      Comments no changes in meds   Physical Exam  Additional Exam:  WD, Overweight, 65 y/o WM in NAD... GENERAL:  Alert & oriented; pleasant & cooperative... HEENT:  [place]/AT, EOM-wnl, PERRLA, EACs-clear, TMs-wnl, NOSE-clear, THROAT-clear & wnl. NECK:  Supple w/ fairROM; no JVD; normal carotid impulses w/o bruits; no thyromegaly or nodules palpated; no lymphadenopathy. CHEST:  Clear to P & A; without wheezes/  rales/ or rhonchi heard... HEART:  Regular Rhythm; without murmurs/ rubs/ or gallops detected... ABDOMEN:  Soft & nontender; normal bowel sounds; no organomegaly or masses palpated... EXT: without deformities, mild arthritic changes; no varicose veins/ venous insuffic/ or edema. NEURO:  CN's intact; motor testing normal; sensory testing normal; gait normal & balance OK. DERM:  No lesions noted; he has mild psoriasis...     MISC. Report  Procedure date:  09/20/2009  Findings:      Lipid Panel (LIPID)   Cholesterol               150 mg/dL                   1-610   Triglycerides             95.0 mg/dL                  9.6-045.0  HDL                       11.91 mg/dL                 >47.82   LDL Cholesterol           85 mg/dL                    9-56   BMP (METABOL)   Sodium                    139 mEq/L                   135-145   Potassium                 4.2 mEq/L                   3.5-5.1   Chloride                  104 mEq/L                   96-112   Carbon Dioxide            29 mEq/L                    19-32   Glucose              [H]  116 mg/dL                   21-30   BUN                       19 mg/dL                    8-65   Creatinine                1.0 mg/dL                   7.8-4.6   Calcium                   9.4 mg/dL                   9.6-29.5   GFR                       80.56 mL/min                >60  Hemoglobin A1C (A1C)   Hemoglobin A1C       [H]  7.4 %                       4.6-6.5  Impression & Recommendations:  Problem # 1:  Hx of CHEST PAIN, ATYPICAL (ICD-786.59) No angina-  needs to incr exercise program...  Problem # 2:  HYPERCHOLESTEROLEMIA (ICD-272.0) Stable on the simva40... His updated medication list for this problem includes:    Simvastatin 40 Mg Tabs (Simvastatin) .Marland Kitchen... Take 1 tablet by mouth at bedtime...  Orders: Venipuncture (28413) TLB-Lipid Panel (80061-LIPID) TLB-BMP (Basic Metabolic Panel-BMET) (80048-METABOL) TLB-A1C / Hgb A1C  (Glycohemoglobin) (83036-A1C)  Problem # 3:  DM (ICD-250.00) Sl improved w/ the Onglyza added... needs better diet, get up the weight loss, etc... His updated medication list for this problem  includes:    Aspirin Low Dose 81 Mg Tabs (Aspirin) .Marland Kitchen... Take 1 tablet by mouth 3 times a week    Lisinopril 10 Mg Tabs (Lisinopril) .Marland Kitchen... Take 1 tablet by mouth once a day    Metformin Hcl 500 Mg Tabs (Metformin hcl) .Marland Kitchen... Take two tablets by mouth two times a day    Actos 45 Mg Tabs (Pioglitazone hcl) .Marland Kitchen... Take 1 tablet by mouth once a day    Onglyza 5 Mg Tabs (Saxagliptin hcl) .Marland Kitchen... Take 1 tablet by mouth once a day  Problem # 4:  GERD (ICD-530.81) GI is stable and up to date...  Problem # 5:  DEGENERATIVE JOINT DISEASE (ICD-715.90) Stable-  incr exercise discussed... His updated medication list for this problem includes:    Aspirin Low Dose 81 Mg Tabs (Aspirin) .Marland Kitchen... Take 1 tablet by mouth 3 times a week  Problem # 6:  OTHER MEDICAL PROBLEMS AS NOTED>>>  Complete Medication List: 1)  Aspirin Low Dose 81 Mg Tabs (Aspirin) .... Take 1 tablet by mouth 3 times a week 2)  Lisinopril 10 Mg Tabs (Lisinopril) .... Take 1 tablet by mouth once a day 3)  Simvastatin 40 Mg Tabs (Simvastatin) .... Take 1 tablet by mouth at bedtime.Marland KitchenMarland Kitchen 4)  Metformin Hcl 500 Mg Tabs (Metformin hcl) .... Take two tablets by mouth two times a day 5)  Actos 45 Mg Tabs (Pioglitazone hcl) .... Take 1 tablet by mouth once a day 6)  Onglyza 5 Mg Tabs (Saxagliptin hcl) .... Take 1 tablet by mouth once a day 7)  Viagra 100 Mg Tabs (Sildenafil citrate) .... Use as directed...  Patient Instructions: 1)  Today we updated your med list- see below.... 2)  Continue your current meds the same... 3)  Today we did your fasting blood work... please call the "phone tree" in a few days for your lab results.Marland KitchenMarland Kitchen 4)  Keep up the good work w/ diet + exercise... 5)  Call for any problems.Marland KitchenMarland Kitchen 6)  Please schedule a follow-up appointment in 6  months.

## 2010-10-02 NOTE — Progress Notes (Signed)
  Phone Note Other Incoming   Request: Send information Summary of Call: Request for records received from [employer] [facility] and [address] of [place]. Request forwarded to [facility].

## 2010-10-16 NOTE — Assessment & Plan Note (Signed)
Summary: 6 month   Vital Signs:  Patient profile:   65 year old male Height:      72 inches Weight:      232.38 pounds O2 Sat:      95 % on Room air Temp:     98.8 degrees F oral Pulse rate:   66 / minute BP sitting:   124 / 72  (left arm) Cuff size:   regular  Vitals Entered By: Lucas Warren CMA (October 02, 2010 8:55 AM)  O2 Sat at Rest %:  95 O2 Flow:  Room air CC: 6 month ROV & review of mult medical problems... Is Patient Diabetic? Yes Pain Assessment Patient in pain? no      Comments no changes in meds   CC:  6 month ROV & review of mult medical problems....  History of Present Illness: 65 y/o WM here for a follow up visit... he has multiple medical problems as noted below...   ~  September 20, 2009:  he's had a good 68mo & good Christmas holiday- weight down 5#, and home sugars  in the 115-155 range (BS=116, A1c=7.4)... feeling well w/o new complaints or concerns...   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 on diet, incr exercise & weight down 3# to 229#, but he notes home BS sl higher in the 150's he says... his CC is some sinus drainage & we discussed trying OTC antihist in AM & FLONASE Qhs...   ~  October 02, 2010:      He notes recent URI w/ head congestion, drainage, cough w/ whitish sput >> rec Mucinex/ Fluids/ etc.    His wt is up 4# and BS generally higher at home; labs today showed A1c= 8.9 (what happened?) on max Metform/ Actos/ Onglyza >> add Glimep 1mg /d & get on track w/ diet/ exercise & get wt down.    Other labs are OK & FLP looks good on Simva40.    Current Problem List:  Health Maintenance - had Pneumovax 2009, and Tetanus shot 2007... he gets the Flu shot each fall... he takes ASA 81mg  but cut back to 2-3 per week due to bruising...   Hx of CHEST PAIN, ATYPICAL (ICD-786.59) - on ASA 81mg /d... eval 2006 due to risk factors: ex-smoker, DM, +FamHx, Chol... NuclearStressTest 1/06 was neg- no  ischemia, no infarct, EF= 67%...  HYPERCHOLESTEROLEMIA (ICD-272.0) - on SIMVASTATIN 40mg /d...  ~  FLP 2/08 showed TChol 173, TG 89, HDL 48, LDL 107  ~  FLP 02/24/08 showed TChol 143, TG 78, HDL 46, LDL 81  ~  FLP 2/10 showed TChol 153, TG 84, HDL 119, LDL 87  ~  FLP 8/10 showed TChol 174, TG 83, HDL 45, LDL 113... needs better diet, same med.  ~  FLP 1/11 showed TChol 150, TG 95, HDL 46, LDL 85  ~  FLP 8/11 showed TChol 142, TG 95, HDL 39, LDL 84  ~  FLP 2/12 showed TChol 157, TG 85, HDL 45, LDL 95  DM (ICD-250.00) - on METFORMIN 1000mg Bid, ACTOS 45mg /d, ONGLYZA 5mg /d, & LISINOPRIL 10mg /d... weight up 4# to 233#> ?on diet and exercise program... tried Glimepiride 1mg  in 2010 but intol w/ low sugars reported.  ~  labs 2/08 showed BS= 131, HgA1c= 7.0.Marland KitchenMarland Kitchen  ~  labs 02/24/08  BS= 116, HgA1c= 7.8.Marland KitchenMarland Kitchen Creat is 0.9- therefore incr Metformin to 1000Bid...  ~  labs 9/09 showed BS= 111,  HgA1c= 7.3.Marland KitchenMarland Kitchen Glimepiride 1mg /d added...  ~  labs 2/10 showed BS= 114, A1c= 7.1.Marland KitchenMarland Kitchen continue meds+better diet (he stopped Glimep due to low BS)  ~  labs 8/10 showed BS= 145, A1c= 7.6.Marland Kitchen. rec> add ONGLYZA 5mg /d...  ~  labs 1/11 showed BS= 116, A1c= 7.4  ~  labs 8/11 showed BS= 134, A1c= 7.4  ~  f/u Ophthalmology check 1/12 by Lucas Warren was neg- no retinopathy.  ~  labs 2/12 showed BS= 162, A1c= 8.9.Marland Kitchen. rec> add GLIMEPIRIDE 1mg /d.  GERD (ICD-530.81) - uses OTC meds as needed...  COLONIC POLYPS, HX OF (ICD-V12.72) - last colonoscopy 10/03 showed 1 sm polyp= 2mm hyperplastic lesion, therefore f/u planned 69yrs...  DEGENERATIVE JOINT DISEASE (ICD-715.90) - recent shot for tennis elbow... eval left shoulder pain 2/08 by Lucas Warren- adhesive capsulitis, improved after shot... hx right frozen shoulder in past, improved w/ PT...  Hx of HEADACHE (ICD-784.0) - hx cervicogenic HA's in 2001- eval by Lucas Warren...  ANXIETY (ICD-300.00)  PSORIASIS (ICD-696.1) - uses Ultravate cream... followed by Lucas Warren and doing  well.   Preventive Screening-Counseling & Management  Alcohol-Tobacco     Smoking Status: quit     Year Quit: 1983  Allergies: 1)  Erythromycin (Erythromycin)  Comments:  Nurse/Medical Assistant: The patient's medications and allergies were reviewed with the patient and were updated in the Medication and Allergy Lists.  Past History:  Past Medical History: Hx of CHEST PAIN, ATYPICAL (ICD-786.59) HYPERCHOLESTEROLEMIA (ICD-272.0) DM (ICD-250.00) GERD (ICD-530.81) COLONIC POLYPS, HX OF (ICD-V12.72) DEGENERATIVE JOINT DISEASE (ICD-715.90) Hx of HEADACHE (ICD-784.0) ANXIETY (ICD-300.00) PSORIASIS (ICD-696.1)  Past Surgical History: S/P left inguinal hernia repair in 1989  Family History: Reviewed history from 04/04/2010 and no changes required. Father died age 62 w/ Alzheimer's dis Mother died age 67 w/ lung cancer (Lucas Warren) 1 Sibling: sister died age 34 w/ Leukemia  Social History: Reviewed history from 04/04/2010 and no changes required. 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    Physical Exam  Additional Exam:  WD, Overweight, 65 y/o WM in NAD... GENERAL:  Alert & oriented; pleasant & cooperative... HEENT:  [place]/AT, EOM-wnl, PERRLA, EACs-clear, TMs-wnl, NOSE-clear, THROAT-clear & wnl. NECK:  Supple w/ fairROM; no JVD; normal carotid impulses w/o bruits; no thyromegaly or nodules palpated; no  lymphadenopathy. CHEST:  Clear to P & A; without wheezes/ rales/ or rhonchi heard... HEART:  Regular Rhythm; without murmurs/ rubs/ or gallops detected... ABDOMEN:  Soft & nontender; normal bowel sounds; no organomegaly or masses palpated... EXT: without deformities, mild arthritic changes; no varicose veins/ venous insuffic/ or edema. NEURO:  CN's intact; motor testing normal; sensory testing normal; gait normal & balance OK. DERM:  No lesions noted; he has mild psoriasis...    Impression & Recommendations:  Problem # 1:  HYPERCHOLESTEROLEMIA (ICD-272.0) FLP looks good on Simva40>  continue same + diet rx. His updated medication list for this problem includes:    Simvastatin 40 Mg Tabs (Simvastatin) .Marland Kitchen... Take 1 tablet by mouth at bedtime...  Problem # 2:  DM (ICD-250.00) What happened> A1c incr to 8.9.Marland Kitchen. rec> take meds regularly, get on  diet & incr exercise, get wt down!!!  For now we will add GLIMEPIRIDE 1mg /d... continue to monitor BS carefully at home. His updated medication list for this problem includes:    Aspirin Low Dose 81 Mg Tabs (Aspirin) .Marland Kitchen... Take 1 tablet by mouth 3 times a week    Lisinopril 10 Mg Tabs (Lisinopril) .Marland Kitchen... Take 1 tablet by mouth once a day    Metformin Hcl 500 Mg Tabs (Metformin hcl) .Marland Kitchen... Take two tablets by mouth two times a day    Actos 45 Mg Tabs (Pioglitazone hcl) .Marland Kitchen... Take 1 tablet by mouth once a day    Onglyza 5 Mg Tabs (Saxagliptin hcl) .Marland Kitchen... Take 1 tablet by mouth once a day    Glimepiride 1 Mg Tabs (Glimepiride) .Marland Kitchen... Take 1 tablet by mouth every morning  Orders: TLB-BMP (Basic Metabolic Panel-BMET) (80048-METABOL) TLB-Hepatic/Liver Function Pnl (80076-HEPATIC) TLB-CBC Platelet - w/Differential (85025-CBCD) TLB-Lipid Panel (80061-LIPID) TLB-TSH (Thyroid Stimulating Hormone) (84443-TSH) TLB-A1C / Hgb A1C (Glycohemoglobin) (83036-A1C) TLB-PSA (Prostate Specific Antigen) (84153-PSA)  Problem # 3:  GI >>> OK on OTC meds Prn... f/u colon  will be due in 2013...  Problem # 4:  GU >>> Stable>  his PSA= 0.71...  Problem # 5:  DEGENERATIVE JOINT DISEASE (ICD-715.90) Still w/ some shoulder pain but OK w/ OTC pain meds... His updated medication list for this problem includes:    Aspirin Low Dose 81 Mg Tabs (Aspirin) .Marland Kitchen... Take 1 tablet by mouth 3 times a week  Problem # 6:  OTHER MEDICAL PROBLEMS AS NOTED>>> He is up to date on his vaccinations etc...  Complete Medication List: 1)  Fluticasone Propionate 50 Mcg/act Susp (Fluticasone propionate) .Marland Kitchen.. 1-2 sprays in each nostril at bedtime.Marland KitchenMarland Kitchen 2)  Aspirin Low Dose 81 Mg Tabs (Aspirin) .... Take 1 tablet by mouth 3 times a week 3)  Lisinopril 10 Mg Tabs (Lisinopril) .... Take 1 tablet by mouth once a day 4)  Simvastatin 40 Mg Tabs (Simvastatin) .... Take 1 tablet by mouth at bedtime.Marland KitchenMarland Kitchen 5)  Metformin Hcl 500 Mg Tabs (Metformin hcl) .... Take two tablets by mouth two times a day 6)  Actos 45 Mg Tabs (Pioglitazone hcl) .... Take 1 tablet by mouth once a day 7)  Onglyza 5 Mg Tabs (Saxagliptin hcl) .... Take 1 tablet by mouth once a day 8)  Viagra 100 Mg Tabs (Sildenafil citrate) .... Use as directed... 9)  Glimepiride 1 Mg Tabs (Glimepiride) .... Take 1 tablet by mouth every morning  Other Orders: T-2 View CXR (71020TC)  Patient Instructions: 1)  Today we updated your med list- see below.... 2)  Continue your current meds the same... 3)  Today we did your follow up CXR & FASTING blood work... please call the "phone tree" in a few days for your lab results.Marland KitchenMarland Kitchen 4)  Let's get on track w/ our diet & exercise program... 5)  We gave you a new One-Touch macine today... 6)  Call for any questions.Marland KitchenMarland Kitchen 7)  Please schedule a follow-up appointment in 6 months. Prescriptions: VIAGRA 100 MG TABS (SILDENAFIL CITRATE) use as directed...  #10 x prn   Entered and Authorized by:   Michele Mcalpine MD   Signed by:   Michele Mcalpine MD on 10/02/2010   Method used:   Print then Give to Patient   RxID:    [id number]    Orders Added: 1)  Est. Patient Level IV [95284] 2)  T-2 View CXR [71020TC] 3)  TLB-BMP (Basic Metabolic Panel-BMET) [80048-METABOL] 4)  TLB-Hepatic/Liver  Function Pnl [80076-HEPATIC] 5)  TLB-CBC Platelet - w/Differential [85025-CBCD] 6)  TLB-Lipid Panel [80061-LIPID] 7)  TLB-TSH (Thyroid Stimulating Hormone) [84443-TSH] 8)  TLB-A1C / Hgb A1C (Glycohemoglobin) [83036-A1C] 9)  TLB-PSA (Prostate Specific Antigen) [16109-UEA]

## 2010-10-22 NOTE — Letter (Signed)
Summary: [facility]  [facility]   Imported By: Sherian Rein 10/15/2010 11:13:20  _____________________________________________________________________  External Attachment:    Type:   Image     Comment:   External Document

## 2011-01-22 ENCOUNTER — Other Ambulatory Visit: Payer: Self-pay | Admitting: Pulmonary Disease

## 2011-02-12 ENCOUNTER — Other Ambulatory Visit: Payer: Self-pay | Admitting: Pulmonary Disease

## 2011-03-27 ENCOUNTER — Encounter: Payer: Self-pay | Admitting: Pulmonary Disease

## 2011-04-02 ENCOUNTER — Ambulatory Visit (INDEPENDENT_AMBULATORY_CARE_PROVIDER_SITE_OTHER): Payer: BC Managed Care – PPO | Admitting: Pulmonary Disease

## 2011-04-02 ENCOUNTER — Other Ambulatory Visit (INDEPENDENT_AMBULATORY_CARE_PROVIDER_SITE_OTHER): Payer: BC Managed Care – PPO

## 2011-04-02 ENCOUNTER — Encounter: Payer: Self-pay | Admitting: Pulmonary Disease

## 2011-04-02 DIAGNOSIS — K219 Gastro-esophageal reflux disease without esophagitis: Secondary | ICD-10-CM

## 2011-04-02 DIAGNOSIS — F411 Generalized anxiety disorder: Secondary | ICD-10-CM

## 2011-04-02 DIAGNOSIS — Z86010 Personal history of colonic polyps: Secondary | ICD-10-CM

## 2011-04-02 DIAGNOSIS — E7800 Pure hypercholesterolemia, unspecified: Secondary | ICD-10-CM

## 2011-04-02 DIAGNOSIS — E119 Type 2 diabetes mellitus without complications: Secondary | ICD-10-CM

## 2011-04-02 DIAGNOSIS — I872 Venous insufficiency (chronic) (peripheral): Secondary | ICD-10-CM | POA: Insufficient documentation

## 2011-04-02 DIAGNOSIS — M1990 Unspecified osteoarthritis, unspecified site: Secondary | ICD-10-CM

## 2011-04-02 DIAGNOSIS — L408 Other psoriasis: Secondary | ICD-10-CM

## 2011-04-02 LAB — BASIC METABOLIC PANEL
BUN: 22 mg/dL (ref 6–23)
CO2: 27 mEq/L (ref 19–32)
Chloride: 103 mEq/L (ref 96–112)
Creatinine, Ser: 0.9 mg/dL (ref 0.4–1.5)
Potassium: 4.6 mEq/L (ref 3.5–5.1)

## 2011-04-02 LAB — LIPID PANEL
Cholesterol: 130 mg/dL (ref 0–200)
Total CHOL/HDL Ratio: 3
Triglycerides: 66 mg/dL (ref 0.0–149.0)

## 2011-04-02 NOTE — Patient Instructions (Signed)
Today we updated your med list in EPIC...    Continue your current meds the same...  Let's get on track w/ our diet & exercise program>    The goal is to lose 10-15 lbs...  Today we did your follow up fasting blood work...    Please call the PHONE TREE in a few days for your results...    Dial [id number] & when prompted enter your patient number followed by the # symbol...    Your patient number is:  [id number]#  For your REFLUX at night:    Try not to eat or drink much after dinner...    Take OTC PRILOSEC 20mg - 30 min before the evening meal...    Elevate the head of your bed on 6" blocks...  Call for any questions...  Let's continue our 6 month follow ups, sooner if needed for problems.Marland KitchenMarland Kitchen

## 2011-04-02 NOTE — Progress Notes (Signed)
Subjective:    Patient ID: Lucas Warren, male    DOB: November 26, 1945, 65 y.o.   MRN: [id number]  HPI 65 y/o WM here for a follow up visit... he has multiple medical problems as noted below...  ~  September 20, 2009:  he's had a good 526mo & good Christmas holiday- weight down 5#, and home sugars  in the 115-155 range (BS=116, A1c=7.4)... feeling well w/o new complaints or concerns...  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 on diet, incr exercise & weight down 3# to 229#, but he notes home BS sl higher in the 150's he says... his CC is some sinus drainage & we discussed trying OTC antihist in AM & FLONASE Qhs...  ~  October 02, 2010:   He notes recent URI w/ head congestion, drainage, cough w/ whitish sput >> rec Mucinex/ Fluids/ etc.    His wt is up 4# and BS generally higher at home; labs today showed A1c= 8.9 (what happened?) on max Metform/ Actos/ Onglyza >> add Glimep 1mg /d & get on track w/ diet/ exercise & get wt down...  Other labs are OK & FLP looks good on Simva40.  ~  April 02, 2011:  526mo ROV & he is doing well overall just some minor somatic complaints> he notes some nocturnal reflux & we reviewed antireflux regimen & will start Prilosec 20mg  before the eve meal; if the symptoms persist he will need GI eval ...  He's also noted some LE edema assoc w/ mild venous insuffic changes; we reviewed low salt diet, elevation & support hose; weight is up 5# & he will work on wt reduction;  Despite the wt gain he notes that home BS checks are improved, and indeed BS=107 & A1c=6.7 today... See Prob List below>>          Problem List:  Health Maintenance - had Pneumovax 2009, and Tetanus shot 2007... he gets the Flu shot each fall... he takes ASA 81mg  but cut back to 2-3 per week due to bruising...   Hx of CHEST PAIN, ATYPICAL (ICD-786.59) - on ASA 81mg /d... eval 2006 due to risk factors: ex-smoker, DM, +FamHx, Chol... NuclearStressTest 1/06  was neg- no ischemia, no infarct, EF= 67%...  HYPERCHOLESTEROLEMIA (ICD-272.0) - on SIMVASTATIN 40mg /d... ~  FLP 2/08 showed TChol 173, TG 89, HDL 48, LDL 107 ~  FLP 02/24/08 showed TChol 143, TG 78, HDL 46, LDL 81 ~  FLP 2/10 showed TChol 153, TG 84, HDL 119, LDL 87 ~  FLP 8/10 showed TChol 174, TG 83, HDL 45, LDL 113... needs better diet, same med. ~  FLP 1/11 showed TChol 150, TG 95, HDL 46, LDL 85 ~  FLP 8/11 showed TChol 142, TG 95, HDL 39, LDL 84 ~  FLP 2/12 showed TChol 157, TG 85, HDL 45, LDL 95 ~  FLP 8/12 showed TChol 130, TG 66, HDL 49, LDL 68  DM (ICD-250.00) - on METFORMIN 1000mg Bid, ACTOS 45mg /d, ONGLYZA 5mg /d, & LISINOPRIL 10mg /d... weight up 4# to 233#> ?on diet and exercise program... tried GLIMEPIRIDE 1mg  in 2010 but intol w/ low sugars reported; it was restarted 2/12 w/ improvement... ~  labs 2/08 showed BS= 131, HgA1c= 7.0... On Metform500Bid + Actos45mg /d. ~  labs 02/24/08  BS= 116, HgA1c= 7.8.Marland KitchenMarland Kitchen Creat is 0.9- therefore incr Metformin to 1000Bid... ~  labs 9/09 showed BS= 111, HgA1c= 7.3.Marland KitchenMarland Kitchen Glimepiride 1mg /d added... ~  labs 2/10 showed BS=  114, A1c= 7.1.Marland KitchenMarland Kitchen continue meds+better diet (he stopped Glimep due to low BS) ~  labs 8/10 showed BS= 145, A1c= 7.6.Marland Kitchen. rec> add ONGLYZA 5mg /d... ~  labs 1/11 showed BS= 116, A1c= 7.4 ~  labs 8/11 showed BS= 134, A1c= 7.4 ~  f/u Ophthalmology check 1/12 by DrMcFarland was neg- no retinopathy. ~  labs 2/12 showed BS= 162, A1c= 8.9.Marland Kitchen. rec> add GLIMEPIRIDE 1mg /d. ~  Labs 8/12 showed BS= 107, A1c= 6.7.Marland KitchenMarland Kitchen Continue all 4 meds!  OVERWEIGHT>> ~  8/12:  Weight up to 237# and we reviewed diet & exercise program needed...  GERD (ICD-530.81) - uses OTC meds as needed...  COLONIC POLYPS, HX OF (ICD-V12.72) - last colonoscopy 10/03 showed 1 sm polyp= 2mm hyperplastic lesion, therefore f/u planned 65yrs...  DEGENERATIVE JOINT DISEASE (ICD-715.90) - recent shot for tennis elbow... eval left shoulder pain 2/08 by DrNorris- adhesive capsulitis,  improved after shot... hx right frozen shoulder in past, improved w/ PT...  Hx of HEADACHE (ICD-784.0) - hx cervicogenic HA's in 2001- eval by DrAdelman...  ANXIETY (ICD-300.00)  PSORIASIS (ICD-696.1) - uses Ultravate cream... followed by Elnora Morrison and doing well.   Past Surgical History  Procedure Date  . Inguinal hernia repair 1989    Outpatient Encounter Prescriptions as of 04/02/2011  Medication Sig Dispense Refill  . ACTOS 45 MG tablet TAKE ONE TABLET BY MOUTH EVERY DAY  30 each  4  . aspirin 81 MG tablet 1 tablet two times per week      . fluticasone (FLONASE) 50 MCG/ACT nasal spray 1-2 sprays in each nostril at bedtime        . glimepiride (AMARYL) 1 MG tablet Take 1 mg by mouth daily before breakfast.        . lisinopril (PRINIVIL,ZESTRIL) 10 MG tablet Take 10 mg by mouth daily.        . metFORMIN (GLUCOPHAGE) 500 MG tablet Take 1,000 mg by mouth 2 (two) times daily with a meal.        . saxagliptin HCl (ONGLYZA) 5 MG TABS tablet Take 5 mg by mouth daily.        . sildenafil (VIAGRA) 100 MG tablet Take 100 mg by mouth daily as needed.        . simvastatin (ZOCOR) 40 MG tablet TAKE ONE TABLET BY MOUTH AT BEDTIME  30 tablet  3    Allergies  Allergen Reactions  . Erythromycin     REACTION: abd pain    Current Medications, Allergies, Past Medical History, Past Surgical History, Family History, and Social History were reviewed in [employer] record.    Review of Systems    See HPI - all other systems neg except as noted...  The patient denies anorexia, fever, weight loss, weight gain, vision loss, decreased hearing, hoarseness, chest pain, syncope, dyspnea on exertion, peripheral edema, prolonged cough, headaches, hemoptysis, abdominal pain, melena, hematochezia, severe indigestion/heartburn, hematuria, incontinence, muscle weakness, suspicious skin lesions, transient blindness, difficulty walking, depression, unusual weight change, abnormal bleeding,  enlarged lymph nodes, and angioedema.   Objective:   Physical Exam    WD, Overweight, 65 y/o WM in NAD... GENERAL:  Alert & oriented; pleasant & cooperative... HEENT:  [place]/AT, EOM-wnl, PERRLA, EACs-clear, TMs-wnl, NOSE-clear, THROAT-clear & wnl. NECK:  Supple w/ fairROM; no JVD; normal carotid impulses w/o bruits; no thyromegaly or nodules palpated; no lymphadenopathy. CHEST:  Clear to P & A; without wheezes/ rales/ or rhonchi heard... HEART:  Regular Rhythm; without murmurs/ rubs/ or gallops detected... ABDOMEN:  Soft &  nontender; normal bowel sounds; no organomegaly or masses palpated... EXT: without deformities, mild arthritic changes; no varicose veins/ venous insuffic/ or edema. NEURO:  CN's intact; motor testing normal; sensory testing normal; gait normal & balance OK. DERM:  No lesions noted; he has mild psoriasis...   Assessment & Plan:   CHOL>  FLP looks good on the Simva40 + diet rx, continue same, get weight down...  DM>  BS & A1c are improved w/ the addition of the low dose Glimepiride;  Continue all 4 meds, plus diet, exercise, & weight reduction...  GI> GERD, Polyps>  Stable on OTC meds as needed; he will be due for colonoscopy in 2013...  DJD>  Hx shoulder & elbow problems, followed by [facility], using OTC analgesics as needed...  Anxiety>  Stress at work etc... He is managing quite well w/o anxiolytic meds required...  PSORIASIS>  Followed by Myrtie Hawk al on Ultravate cream..Marland Kitchen

## 2011-04-04 ENCOUNTER — Encounter: Payer: Self-pay | Admitting: Pulmonary Disease

## 2011-05-27 ENCOUNTER — Other Ambulatory Visit: Payer: Self-pay | Admitting: Pulmonary Disease

## 2011-05-29 ENCOUNTER — Other Ambulatory Visit: Payer: Self-pay | Admitting: Pulmonary Disease

## 2011-06-07 ENCOUNTER — Other Ambulatory Visit: Payer: Self-pay | Admitting: Pulmonary Disease

## 2011-06-15 ENCOUNTER — Other Ambulatory Visit: Payer: Self-pay | Admitting: Pulmonary Disease

## 2011-07-22 ENCOUNTER — Other Ambulatory Visit: Payer: Self-pay | Admitting: Pulmonary Disease

## 2011-07-30 ENCOUNTER — Telehealth: Payer: Self-pay | Admitting: Pulmonary Disease

## 2011-07-30 MED ORDER — AZITHROMYCIN 250 MG PO TABS: ORAL_TABLET | ORAL | Status: AC

## 2011-07-30 NOTE — Telephone Encounter (Signed)
Called and spoke with pt and per TP---ok for zpak #1  Take as directed, use the mucinex and nasal saline spray.  zpak has been sent to the pharmacy per pts request.  Pt voiced his understanding of recs.

## 2011-07-30 NOTE — Telephone Encounter (Signed)
Called and spoke with pt. He is c/o HA, chills, and prod cough with large amounts of yellow sputum x 2 days- taking mucinex dm. He denies any SOB, wheeze, n/v/d or abdominal pain. States he thinks a zpack would help. Please advise, thanks! Allergies  Allergen Reactions  . Erythromycin     REACTION: abd pain

## 2011-09-14 ENCOUNTER — Other Ambulatory Visit: Payer: Self-pay | Admitting: Pulmonary Disease

## 2011-10-02 ENCOUNTER — Other Ambulatory Visit: Payer: Self-pay | Admitting: Pulmonary Disease

## 2011-10-05 ENCOUNTER — Ambulatory Visit (INDEPENDENT_AMBULATORY_CARE_PROVIDER_SITE_OTHER)
Admission: RE | Admit: 2011-10-05 | Discharge: 2011-10-05 | Disposition: A | Discharge status: Home / Self Care | Payer: BC Managed Care – PPO | Source: Ambulatory Visit | Attending: Pulmonary Disease | Admitting: Pulmonary Disease

## 2011-10-05 ENCOUNTER — Ambulatory Visit (INDEPENDENT_AMBULATORY_CARE_PROVIDER_SITE_OTHER): Payer: BC Managed Care – PPO | Admitting: Pulmonary Disease

## 2011-10-05 ENCOUNTER — Encounter: Payer: Self-pay | Admitting: Pulmonary Disease

## 2011-10-05 ENCOUNTER — Other Ambulatory Visit (INDEPENDENT_AMBULATORY_CARE_PROVIDER_SITE_OTHER): Payer: BC Managed Care – PPO

## 2011-10-05 DIAGNOSIS — E119 Type 2 diabetes mellitus without complications: Secondary | ICD-10-CM

## 2011-10-05 DIAGNOSIS — K219 Gastro-esophageal reflux disease without esophagitis: Secondary | ICD-10-CM

## 2011-10-05 DIAGNOSIS — Z23 Encounter for immunization: Secondary | ICD-10-CM

## 2011-10-05 DIAGNOSIS — N139 Obstructive and reflux uropathy, unspecified: Secondary | ICD-10-CM

## 2011-10-05 DIAGNOSIS — Z860100 Personal history of colon polyps, unspecified: Secondary | ICD-10-CM

## 2011-10-05 DIAGNOSIS — R0789 Other chest pain: Secondary | ICD-10-CM

## 2011-10-05 DIAGNOSIS — J019 Acute sinusitis, unspecified: Secondary | ICD-10-CM

## 2011-10-05 DIAGNOSIS — E7800 Pure hypercholesterolemia, unspecified: Secondary | ICD-10-CM

## 2011-10-05 DIAGNOSIS — M1990 Unspecified osteoarthritis, unspecified site: Secondary | ICD-10-CM

## 2011-10-05 DIAGNOSIS — F411 Generalized anxiety disorder: Secondary | ICD-10-CM

## 2011-10-05 LAB — CBC WITH DIFFERENTIAL/PLATELET
Basophils Relative: 0.3 % (ref 0.0–3.0)
Eosinophils Relative: 4.4 % (ref 0.0–5.0)
Hemoglobin: 14.2 g/dL (ref 13.0–17.0)
Lymphocytes Relative: 15.1 % (ref 12.0–46.0)
MCV: 84.7 fl (ref 78.0–100.0)
Neutro Abs: 5.9 10*3/uL (ref 1.4–7.7)
Neutrophils Relative %: 73.6 % (ref 43.0–77.0)
RBC: 4.89 Mil/uL (ref 4.22–5.81)
WBC: 8 10*3/uL (ref 4.5–10.5)

## 2011-10-05 LAB — HEPATIC FUNCTION PANEL
ALT: 17 U/L (ref 0–53)
Bilirubin, Direct: 0.1 mg/dL (ref 0.0–0.3)
Total Bilirubin: 0.4 mg/dL (ref 0.3–1.2)
Total Protein: 7.5 g/dL (ref 6.0–8.3)

## 2011-10-05 LAB — LIPID PANEL
Cholesterol: 161 mg/dL (ref 0–200)
HDL: 45.6 mg/dL (ref >39.00)
LDL Cholesterol: 100 mg/dL — ABNORMAL HIGH (ref 0–99)
Total CHOL/HDL Ratio: 4
Triglycerides: 78 mg/dL (ref 0.0–149.0)

## 2011-10-05 LAB — BASIC METABOLIC PANEL
BUN: 31 mg/dL — ABNORMAL HIGH (ref 6–23)
Calcium: 9.9 mg/dL (ref 8.4–10.5)
Chloride: 103 mEq/L (ref 96–112)
Creatinine, Ser: 1 mg/dL (ref 0.4–1.5)

## 2011-10-05 LAB — HEMOGLOBIN A1C: Hgb A1c MFr Bld: 7.5 % — ABNORMAL HIGH (ref 4.6–6.5)

## 2011-10-05 LAB — PSA: PSA: 0.71 ng/mL (ref 0.10–4.00)

## 2011-10-05 MED ORDER — OMEPRAZOLE 20 MG PO CPDR: 20.0000 mg | DELAYED_RELEASE_CAPSULE | Freq: Every day | ORAL | Status: DC

## 2011-10-05 MED ORDER — METFORMIN HCL 500 MG PO TABS: 1000.0000 mg | ORAL_TABLET | Freq: Two times a day (BID) | ORAL | Status: DC

## 2011-10-05 MED ORDER — GLIMEPIRIDE 1 MG PO TABS: 1.0000 mg | ORAL_TABLET | Freq: Every day | ORAL | Status: DC

## 2011-10-05 MED ORDER — SAXAGLIPTIN HCL 5 MG PO TABS: 5.0000 mg | ORAL_TABLET | Freq: Every day | ORAL | Status: DC

## 2011-10-05 MED ORDER — AMOXICILLIN-POT CLAVULANATE 875-125 MG PO TABS: 1.0000 | ORAL_TABLET | Freq: Two times a day (BID) | ORAL | Status: AC

## 2011-10-05 MED ORDER — SILDENAFIL CITRATE 100 MG PO TABS: 100.0000 mg | ORAL_TABLET | Freq: Every day | ORAL | Status: DC | PRN

## 2011-10-05 MED ORDER — FLUTICASONE PROPIONATE 50 MCG/ACT NA SUSP: NASAL | Status: DC

## 2011-10-05 MED ORDER — SIMVASTATIN 40 MG PO TABS: 40.0000 mg | ORAL_TABLET | Freq: Every day | ORAL | Status: DC

## 2011-10-05 MED ORDER — LISINOPRIL 10 MG PO TABS: 10.0000 mg | ORAL_TABLET | Freq: Every day | ORAL | Status: DC

## 2011-10-05 MED ORDER — PIOGLITAZONE HCL 45 MG PO TABS: 45.0000 mg | ORAL_TABLET | Freq: Every day | ORAL | Status: DC

## 2011-10-05 NOTE — Patient Instructions (Signed)
Today we updated your med list in our EPIC system...    Continue your current medications the same...    We refilled your meds per request...  For your Sinuses:    We wrote a new prescription for AUGMENTIN to take twice daily x7d...    Start on the Antihistamine each AM (Zyrtek10mg , Claritin10mg , Allegra180mg )...    Then the Nasal Saline Mist every 1-2H while awake...    And the Flonase 2sp in each nostril at bedtime...  We gave you the 2012 Flu vaccine today...  Today we did your follow up CXR & fasting blood work...    Please call the [facility] in a few days for your results...    Dial [id number] & when prompted enter your patient number followed by the # symbol...    Your patient number is:  [phone]  Call for any questions...  Let's continue our 45mo follow up visits.Marland KitchenMarland Kitchen

## 2011-10-05 NOTE — Progress Notes (Signed)
Subjective:    Patient ID: Lucas Warren, male    DOB: 1945-10-24, 66 y.o.   MRN: [id number]  HPI 66 y/o WM here for a follow up visit... he has multiple medical problems as noted below...  ~  September 20, 2009:  he's had a good 48mo & good Christmas holiday- weight down 5#, and home sugars  in the 115-155 range (BS=116, A1c=7.4)... feeling well w/o new complaints or concerns...  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 his CC is some sinus drainage & we discussed trying OTC antihist in AM & FLONASE Qhs...  ~  October 02, 2010:   He notes recent URI w/ head congestion, drainage, cough w/ whitish sput >> rec Mucinex/ Fluids/ etc.    His wt is up 4# and BS generally higher at home; labs today showed A1c= 8.9 (what happened?) on max Metform/ Actos/ Onglyza >> add Glimep 1mg /d & get on track w/ diet/ exercise & get wt down...  Other labs are OK & FLP looks good on Simva40.  ~  April 02, 2011:  48mo ROV & he is doing well overall just some minor somatic complaints> he notes some nocturnal reflux & we reviewed antireflux regimen & will start Prilosec 20mg  before the eve meal; if the symptoms persist he will need GI eval ...  He's also noted some LE edema assoc w/ mild venous insuffic changes; we reviewed low salt diet, elevation & support hose; weight is up 5# & he will work on wt reduction;  Despite the wt gain he notes that home BS checks are improved, and indeed BS=107 & A1c=6.7 today... See Prob List below>>  ~  October 05, 2011:  48mo ROV & he is c/o some sinus congestion & drainage, occas bloody in the AM;  We discussed this & decided to treat w/ Augmentin, Allegra, Saline & Flonase...     BP> his BP has been normal & on Lisin10 for DM/renal purposes; BP= 120/78 & denies CP, palpit, dizzy, SOB, edema, etc...    Chol> on Simva40; FLP shows  TChol    DM> on + diet/exerc; Metform500-2Bid, Glim1mg , Actos45, Ongly5; labs showed BS=117 A1c=7.5 & we decided to keep meds the same & work on wt reduction for now...    DJD> he had left hand prob & saw drWeingold w/ splint Rx & improved he says... CXR 2/13 showed norm heart size, mild Ao ectasia, clear lungs, min pleural thickening... LABS 2/13 showed:  FLP- at goal on Simva40;  Chems- ok x BS117 A1c7.5 on ;  CBC- wnl;  TSH=0.55;  PSA=0.71          Problem List:  Health Maintenance - had Pneumovax 2009, and Tetanus shot 2007... he gets the Flu shot each fall... he takes ASA 81mg  but cut back to 2-3 per week due to bruising...   Hx of CHEST PAIN, ATYPICAL (ICD-786.59) - on ASA 81mg /d... eval 2006 due to risk factors: ex-smoker, DM, +FamHx, Chol... NuclearStressTest 1/06 was neg- no ischemia, no infarct, EF= 67%...  HYPERCHOLESTEROLEMIA (ICD-272.0) - on SIMVASTATIN 40mg /d... ~  FLP 2/08 showed TChol 173, TG 89, HDL 48, LDL 107 ~  FLP 02/24/08 showed TChol 143, TG 78, HDL 46, LDL 81 ~  FLP 2/10 showed TChol 153, TG 84, HDL 119, LDL  87 ~  FLP 8/10 showed TChol 174, TG 83, HDL 45, LDL 113... needs better diet, same med. ~  FLP 1/11 showed TChol 150, TG 95, HDL 46, LDL 85 ~  FLP 8/11 showed TChol 142, TG 95, HDL 39, LDL 84 ~  FLP 2/12 on Simva40 showed TChol 157, TG 85, HDL 45, LDL 95 ~  FLP 8/12 on Simva40 showed TChol 130, TG 66, HDL 49, LDL 68 ~  FLP 2/13 on Simva40showed TChol 161, TG 78, HDL 46, LDL 100  DM (ICD-250.00) - on METFORMIN 1000mg Bid, GLIMEPIRIDE 1mg /d, ACTOS 45mg /d, ONGLYZA 5mg /d, & LISINOPRIL 10mg /d...  ~  labs 2/08 showed BS= 131, HgA1c= 7.0... On Metform500Bid + Actos45mg /d. ~  labs 6/09  BS= 116, HgA1c= 7.8.Marland KitchenMarland Kitchen Creat= 0.9- therefore incr Metformin to 1000Bid... ~  labs 9/09 showed BS= 111, HgA1c= 7.3.Marland KitchenMarland Kitchen Glimepiride 1mg /d added... ~  labs 2/10 showed BS= 114, A1c= 7.1.Marland KitchenMarland Kitchen continue meds+better diet (he stopped Glimep due to low BS) ~  labs 8/10 showed BS= 145,  A1c= 7.6.Marland Kitchen. rec> add ONGLYZA 5mg /d... ~  labs 1/11 showed BS= 116, A1c= 7.4 ~  labs 8/11 showed BS= 134, A1c= 7.4 ~  f/u Ophthalmology check 1/12 by DrMcFarland was neg- no retinopathy. ~  labs 2/12 showed BS= 162, A1c= 8.9.Marland Kitchen. rec> add back GLIMEPIRIDE 1mg /d. ~  Labs 8/12 showed BS= 107, A1c= 6.7.Marland KitchenMarland Kitchen Continue all 4 meds! ~  Labs 2/13 showed BS= 117, A1c= 7.5.Marland KitchenMarland Kitchen For now continue same & get wt down!!!  OVERWEIGHT>> ~  8/12:  Weight up to 237# and we reviewed diet & exercise program needed... ~  2/13:  Weight = 234# but needs to do better & get wt down!  GERD (ICD-530.81) - uses OTC meds as needed...  COLONIC POLYPS, HX OF (ICD-V12.72) - last colonoscopy 10/03 showed 1 sm polyp= 2mm hyperplastic lesion, therefore f/u planned 41yrs...  DEGENERATIVE JOINT DISEASE (ICD-715.90) - prev shot for tennis elbow... eval left shoulder pain 2/08 by DrNorris- adhesive capsulitis, improved after shot... hx right frozen shoulder in past, improved w/ PT... Had left hand problem evaluated by DrWeingold & placed in a splint w/ improvement...  Hx of HEADACHE (ICD-784.0) - hx cervicogenic HA's in 2001- eval by DrAdelman...  ANXIETY (ICD-300.00)  PSORIASIS (ICD-696.1) - uses Ultravate cream... followed by Elnora Morrison and doing well.   Past Surgical History  Procedure Date  . Inguinal hernia repair 1989    Outpatient Encounter Prescriptions as of 10/05/2011  Medication Sig Dispense Refill  . ACTOS 45 MG tablet TAKE ONE TABLET BY MOUTH EVERY DAY  30 each  6  . aspirin 81 MG tablet 1 tablet two times per week      . fluticasone (FLONASE) 50 MCG/ACT nasal spray 1-2 sprays in each nostril at bedtime        . glimepiride (AMARYL) 1 MG tablet TAKE ONE TABLET BY MOUTH EVERY DAY IN THE MORNING WITH FOOD  30 tablet  6  . lisinopril (PRINIVIL,ZESTRIL) 10 MG tablet TAKE ONE TABLET BY MOUTH EVERY DAY  30 tablet  6  . metFORMIN (GLUCOPHAGE) 500 MG tablet TAKE TWO TABLETS BY MOUTH TWICE DAILY  120 tablet  5  .  omeprazole (PRILOSEC) 20 MG capsule Take 20 mg by mouth daily. 30 mintues before dinner       . ONGLYZA 5 MG TABS tablet TAKE ONE TABLET BY MOUTH EVERY DAY  30 tablet  11  . sildenafil (VIAGRA) 100 MG tablet Take 100 mg by mouth daily as needed.        Marland Kitchen  simvastatin (ZOCOR) 40 MG tablet TAKE ONE TABLET BY MOUTH EVERY DAY AT BEDTIME  30 tablet  2    Allergies  Allergen Reactions  . Erythromycin     REACTION: abd pain    Current Medications, Allergies, Past Medical History, Past Surgical History, Family History, and Social History were reviewed in [employer] record.    Review of Systems    See HPI - all other systems neg except as noted...  The patient denies anorexia, fever, weight loss, weight gain, vision loss, decreased hearing, hoarseness, chest pain, syncope, dyspnea on exertion, peripheral edema, prolonged cough, headaches, hemoptysis, abdominal pain, melena, hematochezia, severe indigestion/heartburn, hematuria, incontinence, muscle weakness, suspicious skin lesions, transient blindness, difficulty walking, depression, unusual weight change, abnormal bleeding, enlarged lymph nodes, and angioedema.   Objective:   Physical Exam    WD, Overweight, 66 y/o WM in NAD... GENERAL:  Alert & oriented; pleasant & cooperative... HEENT:  [place]/AT, EOM-wnl, PERRLA, EACs-clear, TMs-wnl, NOSE-clear, THROAT-clear & wnl. NECK:  Supple w/ fairROM; no JVD; normal carotid impulses w/o bruits; no thyromegaly or nodules palpated; no lymphadenopathy. CHEST:  Clear to P & A; without wheezes/ rales/ or rhonchi heard... HEART:  Regular Rhythm; without murmurs/ rubs/ or gallops detected... ABDOMEN:  Soft & nontender; normal bowel sounds; no organomegaly or masses palpated... EXT: without deformities, mild arthritic changes; no varicose veins/ venous insuffic/ or edema. NEURO:  CN's intact; motor testing normal; sensory testing normal; gait normal & balance OK. DERM:  No lesions noted;  he has mild psoriasis...  RADIOLOGY DATA:  Reviewed in the EPIC EMR & discussed w/ the patient...    >>CXR 2/13 showed norm heart size, mild Ao ectasia, clear lungs, min pleural thickening...  LABORATORY DATA:  Reviewed in the EPIC EMR & discussed w/ the patient...    >>LABS 2/13 showed:  FLP- at goal on Simva40;  Chems- ok x BS117 A1c7.5 on ;  CBC- wnl;  TSH=0.55;  PSA=0.71   Assessment & Plan:   Sinusitis>  We will Rx w/ Augmentin, Allegra, Saline, Flonase...  CHOL>  FLP looks good on the Simva40 + diet rx, continue same, get weight down...  DM>  BS & A1c are improved w/ the addition of the low dose Glimepiride;  Continue all 4 meds; but needs better diet/ exercise/ wt reduction.  GI> GERD, Polyps>  Stable on OTC meds as needed; he will be due for colonoscopy in 2013...  DJD>  Hx shoulder & elbow problems, followed by [facility], using OTC analgesics as needed...  Anxiety>  Stress at work etc... He is managing quite well w/o anxiolytic meds required...  PSORIASIS>  Followed by Myrtie Hawk al on Ultravate cream...   Patient's Medications  New Prescriptions   No medications on file  Previous Medications   ASPIRIN 81 MG TABLET    1 tablet two times per week  Modified Medications   Modified Medication Previous Medication   FLUTICASONE (FLONASE) 50 MCG/ACT NASAL SPRAY fluticasone (FLONASE) 50 MCG/ACT nasal spray      1-2 sprays in each nostril at bedtime    1-2 sprays in each nostril at bedtime     GLIMEPIRIDE (AMARYL) 1 MG TABLET glimepiride (AMARYL) 1 MG tablet      Take 1 tablet (1 mg total) by mouth daily before breakfast.    TAKE ONE TABLET BY MOUTH EVERY DAY IN THE MORNING WITH FOOD   LISINOPRIL (PRINIVIL,ZESTRIL) 10 MG TABLET lisinopril (PRINIVIL,ZESTRIL) 10 MG tablet      Take 1  tablet (10 mg total) by mouth daily.    TAKE ONE TABLET BY MOUTH EVERY DAY   METFORMIN (GLUCOPHAGE) 500 MG TABLET metFORMIN (GLUCOPHAGE) 500 MG tablet      Take 2 tablets (1,000 mg total)  by mouth 2 (two) times daily with a meal.    TAKE TWO TABLETS BY MOUTH TWICE DAILY   OMEPRAZOLE (PRILOSEC) 20 MG CAPSULE omeprazole (PRILOSEC) 20 MG capsule      Take 1 capsule (20 mg total) by mouth daily. 30 mintues before dinner    Take 20 mg by mouth daily. 30 mintues before dinner    PIOGLITAZONE (ACTOS) 45 MG TABLET ACTOS 45 MG tablet      Take 1 tablet (45 mg total) by mouth daily.    TAKE ONE TABLET BY MOUTH EVERY DAY   SAXAGLIPTIN HCL (ONGLYZA) 5 MG TABS TABLET ONGLYZA 5 MG TABS tablet      Take 1 tablet (5 mg total) by mouth daily.    TAKE ONE TABLET BY MOUTH EVERY DAY   SILDENAFIL (VIAGRA) 100 MG TABLET sildenafil (VIAGRA) 100 MG tablet      Take 1 tablet (100 mg total) by mouth daily as needed.    Take 100 mg by mouth daily as needed.     SIMVASTATIN (ZOCOR) 40 MG TABLET simvastatin (ZOCOR) 40 MG tablet      Take 1 tablet (40 mg total) by mouth at bedtime.    TAKE ONE TABLET BY MOUTH EVERY DAY AT BEDTIME  Discontinued Medications   No medications on file

## 2011-10-30 ENCOUNTER — Telehealth: Payer: Self-pay | Admitting: Pulmonary Disease

## 2011-10-30 NOTE — Telephone Encounter (Signed)
Spoke with pt. He states that the pharmacy at [facility] is going to charge him 300 $ for 90 day supply of onglyza. I called the pharm to see if PA needed, she states that no PA was needed, but the insurance will only do 30 day supply for 10 $. I spoke with pt and he states 30 day supply is fine, no need to call in rx b/c he already has this. Pt states nothing further needed.

## 2012-01-04 ENCOUNTER — Telehealth: Payer: Self-pay | Admitting: Pulmonary Disease

## 2012-01-04 MED ORDER — GLUCOSE BLOOD VI STRP: ORAL_STRIP | Status: AC

## 2012-01-04 NOTE — Telephone Encounter (Signed)
I spoke with pt and he is needing test strips for his one touch mini. I called [facility] pharmacy to see how this needs to be order and was advised to order has blue ultra test strips. I have sent this in and nothing further was needed

## 2012-04-05 ENCOUNTER — Other Ambulatory Visit (INDEPENDENT_AMBULATORY_CARE_PROVIDER_SITE_OTHER): Payer: BC Managed Care – PPO

## 2012-04-05 ENCOUNTER — Ambulatory Visit (INDEPENDENT_AMBULATORY_CARE_PROVIDER_SITE_OTHER): Payer: BC Managed Care – PPO | Admitting: Pulmonary Disease

## 2012-04-05 ENCOUNTER — Encounter: Payer: Self-pay | Admitting: Pulmonary Disease

## 2012-04-05 VITALS — BP 124/80 | HR 56 | Temp 97.0°F | Ht 72.0 in | Wt 234.7 lb

## 2012-04-05 DIAGNOSIS — Z86010 Personal history of colonic polyps: Secondary | ICD-10-CM

## 2012-04-05 DIAGNOSIS — F411 Generalized anxiety disorder: Secondary | ICD-10-CM

## 2012-04-05 DIAGNOSIS — E7800 Pure hypercholesterolemia, unspecified: Secondary | ICD-10-CM

## 2012-04-05 DIAGNOSIS — E119 Type 2 diabetes mellitus without complications: Secondary | ICD-10-CM

## 2012-04-05 DIAGNOSIS — E663 Overweight: Secondary | ICD-10-CM

## 2012-04-05 DIAGNOSIS — K219 Gastro-esophageal reflux disease without esophagitis: Secondary | ICD-10-CM

## 2012-04-05 DIAGNOSIS — M1990 Unspecified osteoarthritis, unspecified site: Secondary | ICD-10-CM

## 2012-04-05 LAB — BASIC METABOLIC PANEL
CO2: 28 mEq/L (ref 19–32)
Calcium: 9.4 mg/dL (ref 8.4–10.5)
GFR: 92.6 mL/min (ref >60.00)
Potassium: 4.9 mEq/L (ref 3.5–5.1)
Sodium: 138 mEq/L (ref 135–145)

## 2012-04-05 LAB — HEPATIC FUNCTION PANEL
AST: 11 U/L (ref 0–37)
Alkaline Phosphatase: 42 U/L (ref 39–117)
Bilirubin, Direct: 0.1 mg/dL (ref 0.0–0.3)
Total Protein: 7.1 g/dL (ref 6.0–8.3)

## 2012-04-05 LAB — LIPID PANEL
HDL: 46.1 mg/dL (ref >39.00)
Total CHOL/HDL Ratio: 3

## 2012-04-05 NOTE — Progress Notes (Signed)
Subjective:    Patient ID: Lucas Warren, male    DOB: 1946-01-27, 66 y.o.   MRN: [id number]  HPI 66 y/o WM here for a follow up visit... he has multiple medical problems as noted below...  ~  September 20, 2009:  he's had a good 34mo & good Christmas holiday- weight down 5#, and home sugars  in the 115-155 range (BS=116, A1c=7.4)... feeling well w/o new complaints or concerns...  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 on diet, incr exercise & weight down 3# to 229#, but he notes home BS sl higher in the 150's he says... his CC is some sinus drainage & we discussed trying OTC antihist in AM & FLONASE Qhs...  ~  October 02, 2010:   He notes recent URI w/ head congestion, drainage, cough w/ whitish sput >> rec Mucinex/ Fluids/ etc.    His wt is up 4# and BS generally higher at home; labs today showed A1c= 8.9 (what happened?) on max Metform/ Actos/ Onglyza >> add Glimep 1mg /d & get on track w/ diet/ exercise & get wt down...  Other labs are OK & FLP looks good on Simva40.  ~  April 02, 2011:  34mo ROV & he is doing well overall just some minor somatic complaints> he notes some nocturnal reflux & we reviewed antireflux regimen & will start Prilosec 20mg  before the eve meal; if the symptoms persist he will need GI eval ...  He's also noted some LE edema assoc w/ mild venous insuffic changes; we reviewed low salt diet, elevation & support hose; weight is up 5# & he will work on wt reduction;  Despite the wt gain he notes that home BS checks are improved, and indeed BS=107 & A1c=6.7 today... See Prob List below>>  ~  October 05, 2011:  34mo ROV & he is c/o some sinus congestion & drainage, occas bloody in the AM;  We discussed this & decided to treat w/ Augmentin, Allegra, Saline & Flonase...     BP> his BP has been normal & on Lisin10 for DM/renal purposes; BP= 120/78 & denies CP, palpit, dizzy, SOB, edema, etc...    Chol> on Simva40; FLP shows  TChol    DM> on + diet/exerc; Metform500-2Bid, Glim1mg , Actos45, Ongly5; labs showed BS=117 A1c=7.5 & we decided to keep meds the same & work on wt reduction for now...    DJD> he had left hand prob & saw drWeingold w/ splint Rx & improved he says... CXR 2/13 showed norm heart size, mild Ao ectasia, clear lungs, min pleural thickening... LABS 2/13 showed:  FLP- at goal on Simva40;  Chems- ok x BS117 A1c7.5 on ;  CBC- wnl;  TSH=0.55;  PSA=0.71  ~  April 05, 2012:  34mo ROV & [name] has had a good interval w/o new complaints or concerns; his weight is unchanged at 235# & we reviewed diet, exercise, & wt reduction strategies...     We reviewed prob list, meds, xrays and labs> see below for updates >> LABS 8/13 showed FLP- at goals on Simva40;  Chems- ok w/ BS=125 A1c=7.1          Problem List:  Health Maintenance - had Pneumovax 2009, and Tetanus shot 2007... he gets the Flu shot each fall... he takes ASA 81mg  but cut back to 2-3 per week due to bruising...   Hx of CHEST PAIN, ATYPICAL (ICD-786.59) - on  ASA 81mg /d... eval 2006 due to risk factors: ex-smoker, DM, +FamHx, Chol... NuclearStressTest 1/06 was neg- no ischemia, no infarct, EF= 67%...  HYPERCHOLESTEROLEMIA (ICD-272.0) - on SIMVASTATIN 40mg /d... ~  FLP 2/08 showed TChol 173, TG 89, HDL 48, LDL 107 ~  FLP 02/24/08 showed TChol 143, TG 78, HDL 46, LDL 81 ~  FLP 2/10 showed TChol 153, TG 84, HDL 119, LDL 87 ~  FLP 8/10 showed TChol 174, TG 83, HDL 45, LDL 113... needs better diet, same med. ~  FLP 1/11 showed TChol 150, TG 95, HDL 46, LDL 85 ~  FLP 8/11 showed TChol 142, TG 95, HDL 39, LDL 84 ~  FLP 2/12 on Simva40 showed TChol 157, TG 85, HDL 45, LDL 95 ~  FLP 8/12 on Simva40 showed TChol 130, TG 66, HDL 49, LDL 68 ~  FLP 2/13 on Simva40 showed TChol 161, TG 78, HDL 46, LDL 100 ~  FLP 8/13 on Simva40 showed TChol 119, TG 96, HDL 46, LDL 54  DM (ICD-250.00) - on METFORMIN 1000mg Bid, GLIMEPIRIDE 1mg /d, ACTOS 45mg /d, ONGLYZA  5mg /d, & LISINOPRIL 10mg /d...  ~  labs 2/08 showed BS= 131, HgA1c= 7.0... On Metform500Bid + Actos45mg /d. ~  labs 6/09  BS= 116, HgA1c= 7.8.Marland KitchenMarland Kitchen Creat= 0.9- therefore incr Metformin to 1000Bid... ~  labs 9/09 showed BS= 111, HgA1c= 7.3.Marland KitchenMarland Kitchen Glimepiride 1mg /d added... ~  labs 2/10 showed BS= 114, A1c= 7.1.Marland KitchenMarland Kitchen continue meds+better diet (he stopped Glimep due to low BS) ~  labs 8/10 showed BS= 145, A1c= 7.6.Marland Kitchen. rec> add ONGLYZA 5mg /d... ~  labs 1/11 showed BS= 116, A1c= 7.4 ~  labs 8/11 showed BS= 134, A1c= 7.4 ~  f/u Ophthalmology check 1/12 by DrMcFarland was neg- no retinopathy. ~  labs 2/12 showed BS= 162, A1c= 8.9.Marland Kitchen. rec> add back GLIMEPIRIDE 1mg /d. ~  Labs 8/12 showed BS= 107, A1c= 6.7.Marland KitchenMarland Kitchen Continue all 4 meds! ~  Labs 2/13 showed BS= 117, A1c= 7.5.Marland KitchenMarland Kitchen For now continue same & get wt down!!! ~  Labs 8/13 on showed BS= 125, A1c= 7.1  OVERWEIGHT>> ~  8/12:  Weight up to 237# and we reviewed diet & exercise program needed... ~  2/13:  Weight = 234# but needs to do better & get wt down! ~  8/13:  Weight = 235# we reviewed diet/ exercise...  GERD (ICD-530.81) - uses OTC PRILOSEC 20mg  as needed...  COLONIC POLYPS, HX OF (ICD-V12.72) - last colonoscopy 10/03 showed 1 sm polyp= 2mm hyperplastic lesion, therefore f/u planned 51yrs...  DEGENERATIVE JOINT DISEASE (ICD-715.90) - prev shot for tennis elbow... eval left shoulder pain 2/08 by DrNorris- adhesive capsulitis, improved after shot... hx right frozen shoulder in past, improved w/ PT... Had left hand problem evaluated by DrWeingold & placed in a splint w/ improvement...  Hx of HEADACHE (ICD-784.0) - hx cervicogenic HA's in 2001- eval by DrAdelman...  ANXIETY (ICD-300.00)  PSORIASIS (ICD-696.1) - uses Ultravate cream... followed by Elnora Morrison and doing well.   Past Surgical History  Procedure Date  . Inguinal hernia repair 1989    Outpatient Encounter Prescriptions as of 04/05/2012  Medication Sig Dispense Refill  . aspirin 81 MG  tablet 1 tablet two times per week      . fluticasone (FLONASE) 50 MCG/ACT nasal spray 1-2 sprays in each nostril at bedtime  48 g  3  . glimepiride (AMARYL) 1 MG tablet Take 1 tablet (1 mg total) by mouth daily before breakfast.  90 tablet  3  . glucose blood (ONE TOUCH ULTRA TEST) test strip Use  as instructed  100 each  6  . lisinopril (PRINIVIL,ZESTRIL) 10 MG tablet Take 1 tablet (10 mg total) by mouth daily.  90 tablet  3  . metFORMIN (GLUCOPHAGE) 500 MG tablet Take 2 tablets (1,000 mg total) by mouth 2 (two) times daily with a meal.  360 tablet  3  . omeprazole (PRILOSEC) 20 MG capsule Take 1 capsule (20 mg total) by mouth daily. 30 mintues before dinner  90 capsule  3  . pioglitazone (ACTOS) 45 MG tablet Take 1 tablet (45 mg total) by mouth daily.  90 tablet  3  . saxagliptin HCl (ONGLYZA) 5 MG TABS tablet Take 1 tablet (5 mg total) by mouth daily.  90 tablet  3  . sildenafil (VIAGRA) 100 MG tablet Take 1 tablet (100 mg total) by mouth daily as needed.  10 tablet  5  . simvastatin (ZOCOR) 40 MG tablet Take 1 tablet (40 mg total) by mouth at bedtime.  90 tablet  3    Allergies  Allergen Reactions  . Erythromycin     REACTION: abd pain    Current Medications, Allergies, Past Medical History, Past Surgical History, Family History, and Social History were reviewed in [employer] record.    Review of Systems    See HPI - all other systems neg except as noted...  The patient denies anorexia, fever, weight loss, weight gain, vision loss, decreased hearing, hoarseness, chest pain, syncope, dyspnea on exertion, peripheral edema, prolonged cough, headaches, hemoptysis, abdominal pain, melena, hematochezia, severe indigestion/heartburn, hematuria, incontinence, muscle weakness, suspicious skin lesions, transient blindness, difficulty walking, depression, unusual weight change, abnormal bleeding, enlarged lymph nodes, and angioedema.   Objective:   Physical Exam    WD,  Overweight, 66 y/o WM in NAD... GENERAL:  Alert & oriented; pleasant & cooperative... HEENT:  [place]/AT, EOM-wnl, PERRLA, EACs-clear, TMs-wnl, NOSE-clear, THROAT-clear & wnl. NECK:  Supple w/ fairROM; no JVD; normal carotid impulses w/o bruits; no thyromegaly or nodules palpated; no lymphadenopathy. CHEST:  Clear to P & A; without wheezes/ rales/ or rhonchi heard... HEART:  Regular Rhythm; without murmurs/ rubs/ or gallops detected... ABDOMEN:  Soft & nontender; normal bowel sounds; no organomegaly or masses palpated... EXT: without deformities, mild arthritic changes; no varicose veins/ venous insuffic/ or edema. NEURO:  CN's intact; motor testing normal; sensory testing normal; gait normal & balance OK. DERM:  No lesions noted; he has mild psoriasis...  RADIOLOGY DATA:  Reviewed in the EPIC EMR & discussed w/ the patient...    >>CXR 2/13 showed norm heart size, mild Ao ectasia, clear lungs, min pleural thickening...  LABORATORY DATA:  Reviewed in the EPIC EMR & discussed w/ the patient...    >>LABS 2/13 showed:  FLP- at goal on Simva40;  Chems- ok x BS117 A1c7.5 on ;  CBC- wnl;  TSH=0.55;  PSA=0.71   Assessment & Plan:   Sinusitis>  We will Rx w/ Augmentin, Allegra, Saline, Flonase...  CHOL>  FLP looks good on the Simva40 + diet rx, continue same, get weight down...  DM>  BS & A1c are improved w/ the addition of the low dose Glimepiride;  Continue all 4 meds; but needs better diet/ exercise/ wt reduction.  GI> GERD, Polyps>  Stable on OTC meds as needed; he will be due for colonoscopy in 2013...  DJD>  Hx shoulder & elbow problems, followed by [facility], using OTC analgesics as needed...  Anxiety>  Stress at work etc... He is managing quite well w/o anxiolytic meds required.Marland KitchenMarland Kitchen  PSORIASIS>  Followed by Myrtie Hawk al on Ultravate cream...   Patient's Medications  New Prescriptions   No medications on file  Previous Medications   ASPIRIN 81 MG TABLET    1 tablet two times  per week   FLUTICASONE (FLONASE) 50 MCG/ACT NASAL SPRAY    1-2 sprays in each nostril at bedtime   GLIMEPIRIDE (AMARYL) 1 MG TABLET    Take 1 tablet (1 mg total) by mouth daily before breakfast.   GLUCOSE BLOOD (ONE TOUCH ULTRA TEST) TEST STRIP    Use as instructed   LISINOPRIL (PRINIVIL,ZESTRIL) 10 MG TABLET    Take 1 tablet (10 mg total) by mouth daily.   METFORMIN (GLUCOPHAGE) 500 MG TABLET    Take 2 tablets (1,000 mg total) by mouth 2 (two) times daily with a meal.   OMEPRAZOLE (PRILOSEC) 20 MG CAPSULE    Take 1 capsule (20 mg total) by mouth daily. 30 mintues before dinner   PIOGLITAZONE (ACTOS) 45 MG TABLET    Take 1 tablet (45 mg total) by mouth daily.   SAXAGLIPTIN HCL (ONGLYZA) 5 MG TABS TABLET    Take 1 tablet (5 mg total) by mouth daily.   SILDENAFIL (VIAGRA) 100 MG TABLET    Take 1 tablet (100 mg total) by mouth daily as needed.   SIMVASTATIN (ZOCOR) 40 MG TABLET    Take 1 tablet (40 mg total) by mouth at bedtime.  Modified Medications   No medications on file  Discontinued Medications   No medications on file

## 2012-04-05 NOTE — Patient Instructions (Addendum)
Today we updated your med list in our EPIC system...    Continue your current medications the same...  Today we did your mid-year targeted labs...    We will call you w/ the results...  Keep up the good work w/ diet & increase the exercise program...    Work on losing 10-15 lbs...  Call for any questions...  Let's continue our 36mo follow up visits.Marland KitchenMarland Kitchen

## 2012-04-18 ENCOUNTER — Encounter: Payer: Self-pay | Admitting: Internal Medicine

## 2012-05-27 ENCOUNTER — Ambulatory Visit (AMBULATORY_SURGERY_CENTER): Payer: BC Managed Care – PPO | Admitting: *Deleted

## 2012-05-27 ENCOUNTER — Encounter: Payer: Self-pay | Admitting: Internal Medicine

## 2012-05-27 VITALS — Ht 72.0 in | Wt 235.0 lb

## 2012-05-27 DIAGNOSIS — Z1211 Encounter for screening for malignant neoplasm of colon: Secondary | ICD-10-CM

## 2012-05-27 MED ORDER — SUPREP BOWEL PREP KIT 17.5-3.13-1.6 GM/177ML PO SOLN: ORAL | Status: DC

## 2012-06-10 ENCOUNTER — Encounter: Payer: Self-pay | Admitting: Internal Medicine

## 2012-06-10 ENCOUNTER — Ambulatory Visit (AMBULATORY_SURGERY_CENTER): Payer: BC Managed Care – PPO | Admitting: Internal Medicine

## 2012-06-10 VITALS — BP 141/69 | HR 58 | Temp 97.8°F | Resp 11 | Ht 72.0 in | Wt 235.0 lb

## 2012-06-10 DIAGNOSIS — Z1211 Encounter for screening for malignant neoplasm of colon: Secondary | ICD-10-CM

## 2012-06-10 DIAGNOSIS — D126 Benign neoplasm of colon, unspecified: Secondary | ICD-10-CM

## 2012-06-10 DIAGNOSIS — K5730 Diverticulosis of large intestine without perforation or abscess without bleeding: Secondary | ICD-10-CM

## 2012-06-10 MED ORDER — SODIUM CHLORIDE 0.9 % IV SOLN: 500.0000 mL | INTRAVENOUS | Status: DC

## 2012-06-10 NOTE — Progress Notes (Signed)
Patient did not experience any of the following events: a burn prior to discharge; a fall within the facility; wrong site/side/patient/procedure/implant event; or a hospital transfer or hospital admission upon discharge from the facility. (G8907) Patient did not have preoperative order for IV antibiotic SSI prophylaxis. (G8918)  

## 2012-06-10 NOTE — Progress Notes (Signed)
The pt tolerated the colonoscopy very well. Maw   

## 2012-06-10 NOTE — Op Note (Signed)
[facility] [address] [place], [address]   COLONOSCOPY PROCEDURE REPORT  PATIENT: Lucas, Warren  MR#: [id number] BIRTHDATE: Jul 11, 1946 , 66  yrs. old GENDER: Male ENDOSCOPIST: Iva Boop, MD, [facility]  PROCEDURE DATE:  06/10/2012 PROCEDURE:   Colonoscopy with biopsy and Colonoscopy with snare polypectomy ASA CLASS:   Class II INDICATIONS:average risk screening. MEDICATIONS: propofol (Diprivan) 200mg  IV, MAC sedation, administered by CRNA, and These medications were titrated to patient response per physician's verbal order  DESCRIPTION OF PROCEDURE:   After the risks benefits and alternatives of the procedure were thoroughly explained, informed consent was obtained.  A digital rectal exam revealed no abnormalities of the rectum and A digital rectal exam revealed the prostate was not enlarged.   The LB [id number]  endoscope was introduced through the anus and advanced to the cecum, which was identified by both the appendix and ileocecal valve. No adverse events experienced.   The quality of the prep was Suprep excellent The instrument was then slowly withdrawn as the colon was fully examined.      COLON FINDINGS: Three diminutive polypoid shaped sessile polyps were found at the cecum, in the descending colon, and sigmoid colon.  A polypectomy was performed with cold forceps and with a cold snare. The resection was complete and the polyp tissue was completely retrieved.   Mild diverticulosis was noted in the sigmoid colon. The colon mucosa was otherwise normal.  Retroflexed views revealed no abnormalities. The time to cecum=2 minutes 55 seconds. Withdrawal time=10 minutes 33 seconds.  The scope was withdrawn and the procedure completed. COMPLICATIONS: There were no complications.  ENDOSCOPIC IMPRESSION: 1.   Three diminutive sessile polyps were found at the cecum, in the descending colon, and sigmoid colon; polypectomy was performed  with cold forceps and with a cold snare 2.   Mild diverticulosis was noted in the sigmoid colon 3.   The colon mucosa was otherwise normal with excellent prep  RECOMMENDATIONS: Timing of repeat colonoscopy will be determined by pathology findings.   eSigned:  Iva Boop, MD, [facility] 06/10/2012 8:42 AM cc: The Patient

## 2012-06-10 NOTE — Patient Instructions (Addendum)
Three very small polyps were removed. They look benign and I will send a letter with results and implications after pathology review.  There was mild diverticulosis seen.  Thank you for choosing me and [facility].  Iva Boop, MD, FACG  YOU HAD AN ENDOSCOPIC PROCEDURE TODAY AT THE [facility]: Refer to the procedure report that was given to you for any specific questions about what was found during the examination.  If the procedure report does not answer your questions, please call your gastroenterologist to clarify.  If you requested that your care partner not be given the details of your procedure findings, then the procedure report has been included in a sealed envelope for you to review at your convenience later.  YOU SHOULD EXPECT: Some feelings of bloating in the abdomen. Passage of more gas than usual.  Walking can help get rid of the air that was put into your GI tract during the procedure and reduce the bloating. If you had a lower endoscopy (such as a colonoscopy or flexible sigmoidoscopy) you may notice spotting of blood in your stool or on the toilet paper. If you underwent a bowel prep for your procedure, then you may not have a normal bowel movement for a few days.  DIET: Your first meal following the procedure should be a light meal and then it is ok to progress to your normal diet.  A half-sandwich or bowl of soup is an example of a good first meal.  Heavy or fried foods are harder to digest and may make you feel nauseous or bloated.  Likewise meals heavy in dairy and vegetables can cause extra gas to form and this can also increase the bloating.  Drink plenty of fluids but you should avoid alcoholic beverages for 24 hours.  ACTIVITY: Your care partner should take you home directly after the procedure.  You should plan to take it easy, moving slowly for the rest of the day.  You can resume normal activity the day after the procedure however you should  NOT DRIVE or use heavy machinery for 24 hours (because of the sedation medicines used during the test).    SYMPTOMS TO REPORT IMMEDIATELY: A gastroenterologist can be reached at any hour.  During normal business hours, 8:30 AM to 5:00 PM Monday through Friday, call [phone].  After hours and on weekends, please call the GI answering service at [phone] who will take a message and have the physician on call contact you.   Following lower endoscopy (colonoscopy or flexible sigmoidoscopy):  Excessive amounts of blood in the stool  Significant tenderness or worsening of abdominal pains  Swelling of the abdomen that is new, acute  Fever of 100F or higher  Following upper endoscopy (EGD)  Vomiting of blood or coffee ground material  New chest pain or pain under the shoulder blades  Painful or persistently difficult swallowing  New shortness of breath  Fever of 100F or higher  Black, tarry-looking stools  FOLLOW UP: If any biopsies were taken you will be contacted by phone or by letter within the next 1-3 weeks.  Call your gastroenterologist if you have not heard about the biopsies in 3 weeks.  Our staff will call the home number listed on your records the next business day following your procedure to check on you and address any questions or concerns that you may have at that time regarding the information given to you following your procedure. This is a courtesy call  and so if there is no answer at the home number and we have not heard from you through the emergency physician on call, we will assume that you have returned to your regular daily activities without incident.  SIGNATURES/CONFIDENTIALITY: You and/or your care partner have signed paperwork which will be entered into your electronic medical record.  These signatures attest to the fact that that the information above on your After Visit Summary has been reviewed and is understood.  Full responsibility of the confidentiality  of this discharge information lies with you and/or your care-partner.    Handouts on diverticulosis, high fiber diet, polyps

## 2012-06-13 ENCOUNTER — Telehealth: Payer: Self-pay | Admitting: *Deleted

## 2012-06-13 NOTE — Telephone Encounter (Signed)
  Follow up Call-  Call back number 06/10/2012  Post procedure Call Back phone  # [phone]  Permission to leave phone message Yes     Patient questions:  Do you have a fever, pain , or abdominal swelling? no Pain Score  0 *  Have you tolerated food without any problems? yes  Have you been able to return to your normal activities? yes  Do you have any questions about your discharge instructions: Diet   no Medications  no Follow up visit  no  Do you have questions or concerns about your Care? no  Actions: * If pain score is 4 or above: No action needed, pain <4.

## 2012-06-14 ENCOUNTER — Encounter: Payer: Self-pay | Admitting: Internal Medicine

## 2012-06-14 NOTE — Progress Notes (Signed)
Quick Note:  Polypoid tissue or hyperplastic - not pre-cancerous polyps ______

## 2012-09-27 ENCOUNTER — Other Ambulatory Visit: Payer: Self-pay | Admitting: Pulmonary Disease

## 2012-09-30 ENCOUNTER — Other Ambulatory Visit: Payer: Self-pay | Admitting: Pulmonary Disease

## 2012-10-10 ENCOUNTER — Ambulatory Visit (INDEPENDENT_AMBULATORY_CARE_PROVIDER_SITE_OTHER): Payer: BC Managed Care – PPO | Admitting: Adult Health

## 2012-10-10 ENCOUNTER — Encounter: Payer: Self-pay | Admitting: Adult Health

## 2012-10-10 VITALS — BP 138/74 | HR 79 | Temp 98.3°F | Ht 72.0 in | Wt 239.2 lb

## 2012-10-10 DIAGNOSIS — J069 Acute upper respiratory infection, unspecified: Secondary | ICD-10-CM

## 2012-10-10 HISTORY — DX: Acute upper respiratory infection, unspecified: J06.9

## 2012-10-10 MED ORDER — AZITHROMYCIN 250 MG PO TABS: ORAL_TABLET | ORAL | Status: AC

## 2012-10-10 MED ORDER — HYDROCODONE-HOMATROPINE 5-1.5 MG/5ML PO SYRP: 5.0000 mL | ORAL_SOLUTION | Freq: Four times a day (QID) | ORAL | Status: DC | PRN

## 2012-10-10 NOTE — Patient Instructions (Addendum)
Z-Pak take as directed. Mucinex DM twice daily as needed. For cough and congestion. Fluids and rest. Tylenol as needed. Hydromet 1-2 teaspoons every 4-6 hours if cough, may make you sleepy Please contact office for sooner follow up if symptoms do not improve or worsen or seek emergency care '

## 2012-10-10 NOTE — Assessment & Plan Note (Signed)
Z-Pak take as directed. Mucinex DM twice daily as needed. For cough and congestion. Fluids and rest. Tylenol as needed. Hydromet 1-2 teaspoons every 4-6 hours if cough, may make you sleepy Please contact office for sooner follow up if symptoms do not improve or worsen or seek emergency care ' 

## 2012-10-10 NOTE — Progress Notes (Signed)
  Subjective:    Patient ID: Lucas Warren, male    DOB: 1946/03/15, 67 y.o.   MRN: [id number]  HPI 67 yo male with known hx of DM, OA and GERD   10/10/2012 Acute OV  Complains of sore throat, head congestion with yellow drainage, prod cough w/ cream colored, PND, some tightness in chest x 3 days - denies SOB, wheezing, f/c/s Sore throat and cough are getting worse.  No fever . No chest pain .  Taking Zicam without much help.   Review of Systems Constitutional:   No  weight loss, night sweats,  Fevers, chills,  +fatigue, or  lassitude.  HEENT:   No headaches,  Difficulty swallowing,  Tooth/dental problems, or  Sore throat,                No sneezing, itching, ear ache,  +nasal congestion, post nasal drip,   CV:  No chest pain,  Orthopnea, PND, swelling in lower extremities, anasarca, dizziness, palpitations, syncope.   GI  No heartburn, indigestion, abdominal pain, nausea, vomiting, diarrhea, change in bowel habits, loss of appetite, bloody stools.   Resp:  ,  No coughing up of blood.    No chest wall deformity  Skin: no rash or lesions.  GU: no dysuria, change in color of urine, no urgency or frequency.  No flank pain, no hematuria   MS:  No joint pain or swelling.  No decreased range of motion.  No back pain.  Psych:  No change in mood or affect. No depression or anxiety.  No memory loss.         Objective:   Physical Exam GEN: A/Ox3; pleasant , NAD  HEENT:  [place]/AT,  EACs-clear, TMs-wnl, NOSE-clear, THROAT-clear, no lesions, no postnasal drip or exudate noted.   NECK:  Supple w/ fair ROM; no JVD; normal carotid impulses w/o bruits; no thyromegaly or nodules palpated; no lymphadenopathy.  RESP  Clear  P & A; w/o, wheezes/ rales/ or rhonchi.no accessory muscle use, no dullness to percussion  CARD:  RRR, no m/r/g  , no peripheral edema, pulses intact, no cyanosis or clubbing.  GI:   Soft & nt; nml bowel sounds; no organomegaly or masses detected.  Musco: Warm bil,  no deformities or joint swelling noted.   Neuro: alert, no focal deficits noted.    Skin: Warm, no lesions or rashes         Assessment & Plan:

## 2012-10-11 ENCOUNTER — Ambulatory Visit: Payer: BC Managed Care – PPO | Admitting: Pulmonary Disease

## 2012-10-24 ENCOUNTER — Other Ambulatory Visit: Payer: Self-pay | Admitting: Pulmonary Disease

## 2012-10-25 ENCOUNTER — Other Ambulatory Visit: Payer: Self-pay | Admitting: Pulmonary Disease

## 2012-10-26 ENCOUNTER — Other Ambulatory Visit: Payer: Self-pay | Admitting: Pulmonary Disease

## 2012-11-03 ENCOUNTER — Other Ambulatory Visit: Payer: Self-pay | Admitting: Pulmonary Disease

## 2012-12-05 ENCOUNTER — Other Ambulatory Visit (INDEPENDENT_AMBULATORY_CARE_PROVIDER_SITE_OTHER): Payer: BC Managed Care – PPO

## 2012-12-05 ENCOUNTER — Encounter: Payer: Self-pay | Admitting: Pulmonary Disease

## 2012-12-05 ENCOUNTER — Ambulatory Visit (INDEPENDENT_AMBULATORY_CARE_PROVIDER_SITE_OTHER): Payer: BC Managed Care – PPO | Admitting: Pulmonary Disease

## 2012-12-05 ENCOUNTER — Ambulatory Visit (INDEPENDENT_AMBULATORY_CARE_PROVIDER_SITE_OTHER)
Admission: RE | Admit: 2012-12-05 | Discharge: 2012-12-05 | Disposition: A | Discharge status: Home / Self Care | Payer: BC Managed Care – PPO | Source: Ambulatory Visit | Attending: Pulmonary Disease | Admitting: Pulmonary Disease

## 2012-12-05 VITALS — BP 122/82 | HR 67 | Temp 97.7°F | Ht 72.0 in | Wt 234.6 lb

## 2012-12-05 DIAGNOSIS — Z23 Encounter for immunization: Secondary | ICD-10-CM

## 2012-12-05 DIAGNOSIS — E7800 Pure hypercholesterolemia, unspecified: Secondary | ICD-10-CM

## 2012-12-05 DIAGNOSIS — K219 Gastro-esophageal reflux disease without esophagitis: Secondary | ICD-10-CM

## 2012-12-05 DIAGNOSIS — M1990 Unspecified osteoarthritis, unspecified site: Secondary | ICD-10-CM

## 2012-12-05 DIAGNOSIS — N320 Bladder-neck obstruction: Secondary | ICD-10-CM

## 2012-12-05 DIAGNOSIS — E119 Type 2 diabetes mellitus without complications: Secondary | ICD-10-CM

## 2012-12-05 DIAGNOSIS — Z860100 Personal history of colon polyps, unspecified: Secondary | ICD-10-CM

## 2012-12-05 DIAGNOSIS — F411 Generalized anxiety disorder: Secondary | ICD-10-CM

## 2012-12-05 DIAGNOSIS — L408 Other psoriasis: Secondary | ICD-10-CM

## 2012-12-05 DIAGNOSIS — E663 Overweight: Secondary | ICD-10-CM

## 2012-12-05 DIAGNOSIS — R0789 Other chest pain: Secondary | ICD-10-CM

## 2012-12-05 LAB — BASIC METABOLIC PANEL
BUN: 21 mg/dL (ref 6–23)
CO2: 29 mEq/L (ref 19–32)
Chloride: 103 mEq/L (ref 96–112)
Creatinine, Ser: 1 mg/dL (ref 0.4–1.5)
Glucose, Bld: 140 mg/dL — ABNORMAL HIGH (ref 70–99)

## 2012-12-05 LAB — HEPATIC FUNCTION PANEL
ALT: 16 U/L (ref 0–53)
Albumin: 4.1 g/dL (ref 3.5–5.2)
Total Bilirubin: 0.4 mg/dL (ref 0.3–1.2)
Total Protein: 7.1 g/dL (ref 6.0–8.3)

## 2012-12-05 LAB — LIPID PANEL
Cholesterol: 149 mg/dL (ref 0–200)
LDL Cholesterol: 87 mg/dL (ref 0–99)
Triglycerides: 124 mg/dL (ref 0.0–149.0)

## 2012-12-05 LAB — HEMOGLOBIN A1C: Hgb A1c MFr Bld: 8.2 % — ABNORMAL HIGH (ref 4.6–6.5)

## 2012-12-05 LAB — CBC WITH DIFFERENTIAL/PLATELET
Basophils Relative: 0.4 % (ref 0.0–3.0)
Eosinophils Relative: 3.7 % (ref 0.0–5.0)
Lymphocytes Relative: 14.4 % (ref 12.0–46.0)
Monocytes Relative: 6.9 % (ref 3.0–12.0)
Neutrophils Relative %: 74.6 % (ref 43.0–77.0)
RBC: 4.67 Mil/uL (ref 4.22–5.81)
WBC: 8.2 10*3/uL (ref 4.5–10.5)

## 2012-12-05 LAB — TSH: TSH: 0.71 u[IU]/mL (ref 0.35–5.50)

## 2012-12-05 NOTE — Patient Instructions (Addendum)
Today we updated your med list in our EPIC system...    Continue your current medications the same...  Today we did your follow up CXR & FASTING blood work...    We will contact you w/ the results when available...   Keep up the good work w/ diet & exercise...    Work on weight reduction...  Call for any questions...  Let's plan a follow up visit in 82mo, sooner if needed for problems.Marland KitchenMarland Kitchen

## 2012-12-05 NOTE — Progress Notes (Signed)
Subjective:    Patient ID: Lucas Warren, male    DOB: 27-Jun-1946, 67 y.o.   MRN: [id number]  HPI 67 y/o WM here for a follow up visit... he has multiple medical problems as noted below...  ~  April 02, 2011:  25mo ROV & he is doing well overall just some minor somatic complaints> he notes some nocturnal reflux & we reviewed antireflux regimen & will start Prilosec 20mg  before the eve meal; if the symptoms persist he will need GI eval ...  He's also noted some LE edema assoc w/ mild venous insuffic changes; we reviewed low salt diet, elevation & support hose; weight is up 5# & he will work on wt reduction;  Despite the wt gain he notes that home BS checks are improved, and indeed BS=107 & A1c=6.7 today... See Prob List below>>  ~  October 05, 2011:  25mo ROV & he is c/o some sinus congestion & drainage, occas bloody in the AM;  We discussed this & decided to treat w/ Augmentin, Allegra, Saline & Flonase...     BP> his BP has been normal & on Lisin10 for DM/renal purposes; BP= 120/78 & denies CP, palpit, dizzy, SOB, edema, etc...    Chol> on Simva40; FLP shows TChol    DM> on + diet/exerc; Metform500-2Bid, Glim1mg , Actos45, Ongly5; labs showed BS=117 A1c=7.5 & we decided to keep meds the same & work on wt reduction for now...    DJD> he had left hand prob & saw drWeingold w/ splint Rx & improved he says... CXR 2/13 showed norm heart size, mild Ao ectasia, clear lungs, min pleural thickening... LABS 2/13 showed:  FLP- at goal on Simva40;  Chems- ok x BS117 A1c7.5 on ;  CBC- wnl;  TSH=0.55;  PSA=0.71  ~  April 05, 2012:  25mo ROV & Lucas Warren has had a good interval w/o new complaints or concerns; his weight is unchanged at 235# & we reviewed diet, exercise, & wt reduction strategies...     We reviewed prob list, meds, xrays and labs> see below for updates >> LABS 8/13 showed FLP- at goals on Simva40;  Chems- ok w/ BS=125 A1c=7.1  ~  December 05, 2012:  54mo ROV & Lucas Warren has had a good interval-  no new complaints or concerns; he is planning on retirement 08/30/13... We reviewed the following medical problems during today's office visit >>      AR> on OTC Antihist prn, Flonase, Hycodan; some symptoms from the pollen etc; he had a URI 2/14 treated by TP w/ ZPak, Mucinex, Hycodan & resolved...    HxCP> on ASA81; no recurrent chest discomfort, palpit, SOB, edema, etc...    Chol> on Simva40; FLP 4/14 shows TChol 149, TG 124, HDL 37, LDL 87; continue same med + better diet and wt reduction...    DM> on Metform500-2Bid, Glimep1mg , Actos45, Onglyza5; Home BS checks are 120-130 he says; Labs 4/14 showed BS=140, A1c=8.2, and we decided to incr his Glimep to 2mg  Qam...    Overwt> wt= 235# and we reviewed diet, exercise, & wt reduction strategies...    GI- GERD, colon polyps> on Prilosec20 (takes prn); he denies abd pain, dysphagia, n/v, c/d, etc; he had colon by DrGessner 10/13 w/ 3 diminutive polyps & divertics; path= hyperplastic & f/u planned 50yrs...    DJD> on OTC meds prn; he's had some shoulder pain but better w/ PT he says...    HAs> hx cervicogenic HAs but resolved w/o recent difficulties.Marland KitchenMarland Kitchen  Anxiety> not on meds & denies recent issues...    Psoriasis> followed by Dillon Bjork, and doing well...  We reviewed prob list, meds, xrays and labs> see below for updates >> he had the 2/13 Flu vaccine; he had a Tetanus shot in 2007; we gave him the PNEUMOVAX today 4/14;  CXR 4/14 showed normal heart size, clear lungs, NAD... LABS 4/14:  FLP- at goals on simva40;  Chems- ok x BS=140, A1c=8.2;  CBC- wnl;  TSH=0.71;  PSA=0.87...          Problem List:  Health Maintenance - had Pneumovax 2009, and Tetanus shot 2007... he gets the Flu shot each fall... he takes ASA 81mg  but cut back to 2-3 per week due to bruising...   Hx of CHEST PAIN, ATYPICAL (ICD-786.59) - on ASA 81mg /d...  ~  eval 2006 due to risk factors: ex-smoker, DM, +FamHx, Chol... NuclearStressTest 1/06 was neg- no ischemia, no  infarct, EF= 67%... ~  CXR 2/13 showed normal heart size, mild Ao ectasia, clear lungs w/ min pleural thickening, NAD... ~  4/14: on ASA81; no recurrent chest discomfort, palpit, SOB, edema, etc; CXR showed normal heart size, clear lungs, NAD.  HYPERCHOLESTEROLEMIA (ICD-272.0) - on SIMVASTATIN 40mg /d... ~  FLP 2/08 showed TChol 173, TG 89, HDL 48, LDL 107 ~  FLP 02/24/08 showed TChol 143, TG 78, HDL 46, LDL 81 ~  FLP 2/10 showed TChol 153, TG 84, HDL 119, LDL 87 ~  FLP 8/10 showed TChol 174, TG 83, HDL 45, LDL 113... needs better diet, same med. ~  FLP 1/11 showed TChol 150, TG 95, HDL 46, LDL 85 ~  FLP 8/11 showed TChol 142, TG 95, HDL 39, LDL 84 ~  FLP 2/12 on Simva40 showed TChol 157, TG 85, HDL 45, LDL 95 ~  FLP 8/12 on Simva40 showed TChol 130, TG 66, HDL 49, LDL 68 ~  FLP 2/13 on Simva40 showed TChol 161, TG 78, HDL 46, LDL 100 ~  FLP 8/13 on Simva40 showed TChol 119, TG 96, HDL 46, LDL 54 ~  FLP 4/14 on Simva40 showed TChol 149, TG 124, HDL 37, LDL 87; continue same med + better diet and wt reduction.  DM (ICD-250.00) - on METFORMIN 1000mg Bid, GLIMEPIRIDE 1mg /d, ACTOS 45mg /d, ONGLYZA 5mg /d, & LISINOPRIL 10mg /d...  ~  labs 2/08 showed BS= 131, HgA1c= 7.0... On Metform500Bid + Actos45mg /d. ~  labs 6/09  BS= 116, HgA1c= 7.8.Marland KitchenMarland Kitchen Creat= 0.9- therefore incr Metformin to 1000Bid... ~  labs 9/09 showed BS= 111, HgA1c= 7.3.Marland KitchenMarland Kitchen Glimepiride 1mg /d added... ~  labs 2/10 showed BS= 114, A1c= 7.1.Marland KitchenMarland Kitchen continue meds+better diet (he stopped Glimep due to low BS) ~  labs 8/10 showed BS= 145, A1c= 7.6.Marland Kitchen. rec> add ONGLYZA 5mg /d... ~  labs 1/11 showed BS= 116, A1c= 7.4 ~  labs 8/11 showed BS= 134, A1c= 7.4 ~  f/u Ophthalmology check 1/12 by DrMcFarland was neg- no retinopathy. ~  labs 2/12 showed BS= 162, A1c= 8.9.Marland Kitchen. rec> add back GLIMEPIRIDE 1mg /d. ~  Labs 8/12 showed BS= 107, A1c= 6.7.Marland KitchenMarland Kitchen Continue all 4 meds! ~  Labs 2/13 showed BS= 117, A1c= 7.5.Marland KitchenMarland Kitchen For now continue same & get wt down!!! ~  3/13:   Optometry check by DrMcFarland> early cats, no retinopathy... ~  Labs 8/13 on showed BS= 125, A1c= 7.1 ~  4/14: on Metform500-2Bid, Glimep1mg , Actos45, Onglyza5; Home BS checks are 120-130 he says; Labs 4/14 showed BS=140, A1c=8.2, and we decided to incr his Glimep to 2mg  Qam.  OVERWEIGHT>> ~  8/12:  Weight up to 237# and we reviewed diet & exercise program needed... ~  2/13:  Weight = 234# but needs to do better & get wt down! ~  8/13:  Weight = 235# and we reviewed diet/ exercise... ~  4/14:  weigfht = 235#  GERD (ICD-530.81) - uses OTC PRILOSEC 20mg  as needed...  COLONIC POLYPS, HX OF (ICD-V12.72) -  ~  Colonoscopy 10/03 showed 1 sm polyp= 2mm hyperplastic lesion, therefore f/u planned 43yrs... ~  Colonoscopy 10/13 by DrGessner showed 3 diminutive polyps, divertics, and Path= hyperplastic...   DEGENERATIVE JOINT DISEASE (ICD-715.90) - prev shot for tennis elbow... eval left shoulder pain 2/08 by DrNorris- adhesive capsulitis, improved after shot... hx right frozen shoulder in past, improved w/ PT... Had left hand problem evaluated by DrWeingold & placed in a splint w/ improvement...  Hx of HEADACHE (ICD-784.0) - hx cervicogenic HA's in 2001- eval by DrAdelman...  ANXIETY (ICD-300.00)  PSORIASIS (ICD-696.1) - uses Ultravate cream... followed by Elnora Morrison and doing well.   Past Surgical History  Procedure Laterality Date  . Inguinal hernia repair  1989  . Nasal septum surgery  1989    Outpatient Encounter Prescriptions as of 12/05/2012  Medication Sig Dispense Refill  . aspirin 81 MG tablet 1 tablet once per week      . fluticasone (FLONASE) 50 MCG/ACT nasal spray 1-2 sprays in each nostril at bedtime  48 g  3  . glimepiride (AMARYL) 1 MG tablet TAKE ONE TABLET BY MOUTH EVERY DAY BEORE BREAKFAST  90 tablet  0  . glucose blood (ONE TOUCH ULTRA TEST) test strip Use as instructed  100 each  6  . lisinopril (PRINIVIL,ZESTRIL) 10 MG tablet TAKE ONE TABLET BY MOUTH EVERY DAY  90  tablet  0  . metFORMIN (GLUCOPHAGE) 500 MG tablet TAKE TWO TABLETS BY MOUTH TWICE DAILY WITH A MEAL  360 tablet  0  . omeprazole (PRILOSEC) 20 MG capsule Take 20 mg by mouth daily as needed. 30 mintues before dinner      . ONGLYZA 5 MG TABS tablet TAKE ONE TABLET BY MOUTH EVERY DAY  90 tablet  0  . pioglitazone (ACTOS) 45 MG tablet TAKE ONE TABLET BY MOUTH EVERY DAY  90 tablet  0  . sildenafil (VIAGRA) 100 MG tablet Take 1 tablet (100 mg total) by mouth daily as needed.  10 tablet  5  . simvastatin (ZOCOR) 40 MG tablet TAKE ONE TABLET BY MOUTH EVERY DAY AT BEDTIME  90 tablet  2  . HYDROcodone-homatropine (HYDROMET) 5-1.5 MG/5ML syrup Take 5 mLs by mouth every 6 (six) hours as needed for cough.  240 mL  0  . [DISCONTINUED] metFORMIN (GLUCOPHAGE) 500 MG tablet TAKE TWO TABLETS BY MOUTH TWICE DAILY WITH A MEAL  360 tablet  0  . [DISCONTINUED] ONGLYZA 5 MG TABS tablet TAKE ONE TABLET BY MOUTH EVERY DAY  90 tablet  0   No facility-administered encounter medications on file as of 12/05/2012.    Allergies  Allergen Reactions  . Erythromycin     REACTION: abd pain    Current Medications, Allergies, Past Medical History, Past Surgical History, Family History, and Social History were reviewed in [employer] record.    Review of Systems    See HPI - all other systems neg except as noted...  The patient denies anorexia, fever, weight loss, weight gain, vision loss, decreased hearing, hoarseness, chest pain, syncope, dyspnea on exertion, peripheral edema, prolonged cough, headaches, hemoptysis, abdominal pain, melena, hematochezia,  severe indigestion/heartburn, hematuria, incontinence, muscle weakness, suspicious skin lesions, transient blindness, difficulty walking, depression, unusual weight change, abnormal bleeding, enlarged lymph nodes, and angioedema.   Objective:   Physical Exam    WD, Overweight, 67 y/o WM in NAD... Warren:  Alert & oriented; pleasant &  cooperative... HEENT:  [place]/AT, EOM-wnl, PERRLA, EACs-clear, TMs-wnl, NOSE-clear, THROAT-clear & wnl. NECK:  Supple w/ fairROM; no JVD; normal carotid impulses w/o bruits; no thyromegaly or nodules palpated; no lymphadenopathy. CHEST:  Clear to P & A; without wheezes/ rales/ or rhonchi heard... HEART:  Regular Rhythm; without murmurs/ rubs/ or gallops detected... ABDOMEN:  Soft & nontender; normal bowel sounds; no organomegaly or masses palpated... EXT: without deformities, mild arthritic changes; no varicose veins/ venous insuffic/ or edema. NEURO:  CN's intact; motor testing normal; sensory testing normal; gait normal & balance OK. DERM:  No lesions noted; he has mild psoriasis...  RADIOLOGY DATA:  Reviewed in the EPIC EMR & discussed w/ the patient...    >>CXR 2/13 showed norm heart size, mild Ao ectasia, clear lungs, min pleural thickening...  LABORATORY DATA:  Reviewed in the EPIC EMR & discussed w/ the patient...    >>LABS 2/13 showed:  FLP- at goal on Simva40;  Chems- ok x BS117 A1c7.5 on ;  CBC- wnl;  TSH=0.55;  PSA=0.71   Assessment & Plan:    Sinusitis>  on OTC Antihist prn, Flonase, Hycodan; some symptoms from the pollen etc; he had a URI 2/14 treated by TP w/ ZPak, Mucinex, Hycodan & resolved.  CHOL>  FLP looks good on the Simva40 + diet rx, continue same, get weight down...  DM>  BS & A1c are not well controlled- Continue all 4 meds & incr Glimep from 1mg =>2mg  Qam; needs better diet/ exercise/ wt reduction.  GI> GERD, Polyps>  Stable on OTC meds as needed; colonoscopy 2013 showed hyperplastic polyps & f/u planned 61yrs..  DJD>  Hx shoulder & elbow problems, followed by [facility], using OTC analgesics as needed...  Anxiety>  Stress at work etc... He is managing quite well w/o anxiolytic meds & plans retirement 12/14...  PSORIASIS>  Followed by Myrtie Hawk al on Ultravate cream...   Patient's Medications  New Prescriptions   GLIMEPIRIDE (AMARYL) 2 MG TABLET     Take 1 tablet (2 mg total) by mouth daily before breakfast.  Previous Medications   ASPIRIN 81 MG TABLET    1 tablet once per week   FLUTICASONE (FLONASE) 50 MCG/ACT NASAL SPRAY    1-2 sprays in each nostril at bedtime   GLUCOSE BLOOD (ONE TOUCH ULTRA TEST) TEST STRIP    Use as instructed   HYDROCODONE-HOMATROPINE (HYDROMET) 5-1.5 MG/5ML SYRUP    Take 5 mLs by mouth every 6 (six) hours as needed for cough.   LISINOPRIL (PRINIVIL,ZESTRIL) 10 MG TABLET    TAKE ONE TABLET BY MOUTH EVERY DAY   METFORMIN (GLUCOPHAGE) 500 MG TABLET    TAKE TWO TABLETS BY MOUTH TWICE DAILY WITH A MEAL   OMEPRAZOLE (PRILOSEC) 20 MG CAPSULE    Take 20 mg by mouth daily as needed. 30 mintues before dinner   ONGLYZA 5 MG TABS TABLET    TAKE ONE TABLET BY MOUTH EVERY DAY   PIOGLITAZONE (ACTOS) 45 MG TABLET    TAKE ONE TABLET BY MOUTH EVERY DAY   SILDENAFIL (VIAGRA) 100 MG TABLET    Take 1 tablet (100 mg total) by mouth daily as needed.   SIMVASTATIN (ZOCOR) 40 MG TABLET    TAKE ONE TABLET  BY MOUTH EVERY DAY AT BEDTIME  Modified Medications   No medications on file  Discontinued Medications   GLIMEPIRIDE (AMARYL) 1 MG TABLET    TAKE ONE TABLET BY MOUTH EVERY DAY BEORE BREAKFAST   METFORMIN (GLUCOPHAGE) 500 MG TABLET    TAKE TWO TABLETS BY MOUTH TWICE DAILY WITH A MEAL   ONGLYZA 5 MG TABS TABLET    TAKE ONE TABLET BY MOUTH EVERY DAY

## 2012-12-06 ENCOUNTER — Other Ambulatory Visit: Payer: Self-pay | Admitting: Pulmonary Disease

## 2012-12-06 LAB — MICROALBUMIN, URINE: Microalb, Ur: 2.74 mg/dL — ABNORMAL HIGH (ref 0.00–1.89)

## 2012-12-06 MED ORDER — GLIMEPIRIDE 2 MG PO TABS: 2.0000 mg | ORAL_TABLET | Freq: Every day | ORAL | Status: DC

## 2013-01-30 ENCOUNTER — Other Ambulatory Visit: Payer: Self-pay | Admitting: Pulmonary Disease

## 2013-03-02 ENCOUNTER — Telehealth: Payer: Self-pay | Admitting: Pulmonary Disease

## 2013-03-02 NOTE — Telephone Encounter (Signed)
I called [facility] bc RX was sent back in April. [facility] confirmed they did receive this. I called and made spouse aware. Nothing further was needed

## 2013-05-02 ENCOUNTER — Telehealth: Payer: Self-pay | Admitting: Pulmonary Disease

## 2013-05-02 MED ORDER — METFORMIN HCL 500 MG PO TABS: 500.0000 mg | ORAL_TABLET | Freq: Two times a day (BID) | ORAL | Status: DC

## 2013-05-02 MED ORDER — SAXAGLIPTIN HCL 5 MG PO TABS: 5.0000 mg | ORAL_TABLET | Freq: Every day | ORAL | Status: DC

## 2013-05-02 NOTE — Telephone Encounter (Signed)
Rx have been sent in. Pt is aware.

## 2013-06-09 ENCOUNTER — Ambulatory Visit: Payer: BC Managed Care – PPO | Admitting: Pulmonary Disease

## 2013-07-07 ENCOUNTER — Ambulatory Visit (INDEPENDENT_AMBULATORY_CARE_PROVIDER_SITE_OTHER): Payer: BC Managed Care – PPO | Admitting: Pulmonary Disease

## 2013-07-07 ENCOUNTER — Encounter: Payer: Self-pay | Admitting: Pulmonary Disease

## 2013-07-07 ENCOUNTER — Other Ambulatory Visit (INDEPENDENT_AMBULATORY_CARE_PROVIDER_SITE_OTHER): Payer: BC Managed Care – PPO

## 2013-07-07 VITALS — BP 124/76 | HR 63 | Temp 97.8°F | Ht 72.0 in | Wt 238.2 lb

## 2013-07-07 DIAGNOSIS — Z23 Encounter for immunization: Secondary | ICD-10-CM

## 2013-07-07 DIAGNOSIS — E663 Overweight: Secondary | ICD-10-CM

## 2013-07-07 DIAGNOSIS — Z860100 Personal history of colon polyps, unspecified: Secondary | ICD-10-CM

## 2013-07-07 DIAGNOSIS — E119 Type 2 diabetes mellitus without complications: Secondary | ICD-10-CM

## 2013-07-07 DIAGNOSIS — F411 Generalized anxiety disorder: Secondary | ICD-10-CM

## 2013-07-07 DIAGNOSIS — E7800 Pure hypercholesterolemia, unspecified: Secondary | ICD-10-CM

## 2013-07-07 DIAGNOSIS — M1990 Unspecified osteoarthritis, unspecified site: Secondary | ICD-10-CM

## 2013-07-07 DIAGNOSIS — L408 Other psoriasis: Secondary | ICD-10-CM

## 2013-07-07 DIAGNOSIS — K219 Gastro-esophageal reflux disease without esophagitis: Secondary | ICD-10-CM

## 2013-07-07 LAB — HEMOGLOBIN A1C: Hgb A1c MFr Bld: 7.9 % — ABNORMAL HIGH (ref 4.6–6.5)

## 2013-07-07 LAB — BASIC METABOLIC PANEL
Chloride: 103 mEq/L (ref 96–112)
Potassium: 4.3 mEq/L (ref 3.5–5.1)
Sodium: 139 mEq/L (ref 135–145)

## 2013-07-07 MED ORDER — SILDENAFIL CITRATE 100 MG PO TABS: 100.0000 mg | ORAL_TABLET | Freq: Every day | ORAL | Status: DC | PRN

## 2013-07-07 MED ORDER — GLIMEPIRIDE 4 MG PO TABS: 4.0000 mg | ORAL_TABLET | Freq: Every day | ORAL | Status: DC

## 2013-07-07 MED ORDER — SILDENAFIL CITRATE 20 MG PO TABS: ORAL_TABLET | ORAL | Status: DC

## 2013-07-07 MED ORDER — METFORMIN HCL 500 MG PO TABS: 1000.0000 mg | ORAL_TABLET | Freq: Two times a day (BID) | ORAL | Status: DC

## 2013-07-07 NOTE — Patient Instructions (Signed)
Today we updated your med list in our EPIC system...    Continue your current medications the same...  We decided to continue the Metformin 500mg - 2 tabs twice daily...     And the Glimepiride 4mg - one tab each AM...  topday we did your follow up DM blood work...    We will contact you w/ the results when available...   We gave you the 2014 flu vaccine today...  Call for any questions...  Let's plan a follow up visit in 42mo, sooner if needed for problems.Marland KitchenMarland Kitchen

## 2013-07-07 NOTE — Progress Notes (Signed)
Subjective:    Patient ID: Lucas Warren, male    DOB: [birth year], 67 y.o.   MRN: [id number]  HPI 67 y/o WM here for a follow up visit... he has multiple medical problems as noted below...  ~  October 05, 2011:  29mo ROV & he is c/o some sinus congestion & drainage, occas bloody in the AM;  We discussed this & decided to treat w/ Augmentin, Allegra, Saline & Flonase...     BP> his BP has been normal & on Lisin10 for DM/renal purposes; BP= 120/78 & denies CP, palpit, dizzy, SOB, edema, etc...    Chol> on Simva40; FLP shows TChol    DM> on + diet/exerc; Metform500-2Bid, Glim1mg , Actos45, Ongly5; labs showed BS=117 A1c=7.5 & we decided to keep meds the same & work on wt reduction for now...    DJD> he had left hand prob & saw drWeingold w/ splint Rx & improved he says... CXR 2/13 showed norm heart size, mild Ao ectasia, clear lungs, min pleural thickening... LABS 2/13 showed:  FLP- at goal on Simva40;  Chems- ok x BS117 A1c7.5 on ;  CBC- wnl;  TSH=0.55;  PSA=0.71  ~  April 05, 2012:  29mo ROV & [name] has had a good interval w/o new complaints or concerns; his weight is unchanged at 235# & we reviewed diet, exercise, & wt reduction strategies...     We reviewed prob list, meds, xrays and labs> see below for updates >> LABS 8/13 showed FLP- at goals on Simva40;  Chems- ok w/ BS=125 A1c=7.1  ~  December 05, 2012:  97mo ROV & [name] has had a good interval- no new complaints or concerns; he is planning on retirement 08/30/13... We reviewed the following medical problems during today's office visit >>     AR> on OTC Antihist prn, Flonase, Hycodan; some symptoms from the pollen etc; he had a URI 2/14 treated by TP w/ ZPak, Mucinex, Hycodan & resolved...    HxCP> on ASA81; no recurrent chest discomfort, palpit, SOB, edema, etc...    Chol> on Simva40; FLP 4/14 shows TChol 149, TG 124, HDL 37, LDL 87; continue same med + better diet and wt reduction...    DM> on Metform500-2Bid, Glimep1mg , Actos45,  Onglyza5; Home BS checks are 120-130 he says; Labs 4/14 showed BS=140, A1c=8.2, and we decided to incr his Glimep to 2mg  Qam...    Overwt> wt= 235# and we reviewed diet, exercise, & wt reduction strategies...    GI- GERD, colon polyps> on Prilosec20 (takes prn); he denies abd pain, dysphagia, n/v, c/d, etc; he had colon by DrGessner 10/13 w/ 3 diminutive polyps & divertics; path= hyperplastic & f/u planned 70yrs...    DJD> on OTC meds prn; he's had some shoulder pain but better w/ PT he says...    HAs> hx cervicogenic HAs but resolved w/o recent difficulties...    Anxiety> not on meds & denies recent issues...    Psoriasis> followed by Dillon Bjork, and doing well... We reviewed prob list, meds, xrays and labs> see below for updates >> he had the 2/13 Flu vaccine; he had a Tetanus shot in 2007; we gave him the PNEUMOVAX today 4/14;  CXR 4/14 showed normal heart size, clear lungs, NAD... LABS 4/14:  FLP- at goals on simva40;  Chems- ok x BS=140, A1c=8.2;  CBC- wnl;  TSH=0.71;  PSA=0.87...   ~  July 07, 2013:  29mo ROV & [name] reports a good interval- no new complaints or concerns; he is  going to retire next month 7 looking forward to incr exercise 7 promises to lose weight... We reviewed the following medical problems during today's office visit >>     AR> on OTC Antihist prn, Flonase; some symptoms from the pollen etc; he had a URI 2/14 treated by TP w/ ZPak, Mucinex, Hycodan & resolved...    HxCP> on ASA81; no recurrent chest discomfort, palpit, SOB, edema, etc; needs to incr exercise program...    Chol> on Simva40; FLP 4/14 shows TChol 149, TG 124, HDL 37, LDL 87; continue same med + better diet and wt reduction...    DM> on Metform500-2Bid, Glimep2mg , Actos45, Onglyza5; Home BS checks are ~150 he says; Labs 11/14 showed BS=133, A1c=7.9, and we decided to incr his Glimep to 4mg  Qam...    Overwt> wt= 238# and we reviewed diet, exercise, & wt reduction strategies...    GI- GERD, colon polyps>  on Prilosec20 (takes prn); he denies abd pain, dysphagia, n/v, c/d, etc; he had colon by DrGessner 10/13 w/ 3 diminutive polyps & divertics; path= hyperplastic & f/u planned 63yrs...    DJD> on OTC meds prn; he's had some shoulder pain but better w/ PT he says...    HAs> hx cervicogenic HAs but resolved w/o recent difficulties...    Anxiety> not on meds & denies recent issues...    Psoriasis> followed by Dillon Bjork, and doing well... We reviewed prob list, meds, xrays and labs> see below for updates >> OK Flu shot today... LABS 11/14:  Chems- ok w/ BS=133;  A1c=7.9 (sl improved- about on max meds)...           Problem List:  Health Maintenance - had Pneumovax 2009, and Tetanus shot 2007... he gets the Flu shot each fall... he takes ASA 81mg  but cut back to 2-3 per week due to bruising...   Hx of CHEST PAIN, ATYPICAL (ICD-786.59) - on ASA 81mg /d...  ~  eval 2006 due to risk factors: ex-smoker, DM, +FamHx, Chol... NuclearStressTest 1/06 was neg- no ischemia, no infarct, EF= 67%... ~  CXR 2/13 showed normal heart size, mild Ao ectasia, clear lungs w/ min pleural thickening, NAD... ~  4/14 & 11/14: on ASA81; no recurrent chest discomfort, palpit, SOB, edema, etc; CXR showed normal heart size, clear lungs, NAD.  HYPERCHOLESTEROLEMIA (ICD-272.0) - on SIMVASTATIN 40mg /d... ~  FLP 2/08 showed TChol 173, TG 89, HDL 48, LDL 107 ~  FLP 02/24/08 showed TChol 143, TG 78, HDL 46, LDL 81 ~  FLP 2/10 showed TChol 153, TG 84, HDL 119, LDL 87 ~  FLP 8/10 showed TChol 174, TG 83, HDL 45, LDL 113... needs better diet, same med. ~  FLP 1/11 showed TChol 150, TG 95, HDL 46, LDL 85 ~  FLP 8/11 showed TChol 142, TG 95, HDL 39, LDL 84 ~  FLP 2/12 on Simva40 showed TChol 157, TG 85, HDL 45, LDL 95 ~  FLP 8/12 on Simva40 showed TChol 130, TG 66, HDL 49, LDL 68 ~  FLP 2/13 on Simva40 showed TChol 161, TG 78, HDL 46, LDL 100 ~  FLP 8/13 on Simva40 showed TChol 119, TG 96, HDL 46, LDL 54 ~  FLP 4/14 on Simva40  showed TChol 149, TG 124, HDL 37, LDL 87; continue same med + better diet and wt reduction.  DM (ICD-250.00) - on METFORMIN 1000mg Bid, GLIMEPIRIDE 2=>4mg /d, ACTOS 45mg /d, ONGLYZA 5mg /d, & LISINOPRIL 10mg /d...  ~  labs 2/08 showed BS= 131, HgA1c= 7.0... On Metform500Bid + Actos45mg /d. ~  labs 6/09  BS= 116, HgA1c= 7.8.Marland KitchenMarland Kitchen Creat= 0.9- therefore incr Metformin to 1000Bid... ~  labs 9/09 showed BS= 111, HgA1c= 7.3.Marland KitchenMarland Kitchen Glimepiride 1mg /d added... ~  labs 2/10 showed BS= 114, A1c= 7.1.Marland KitchenMarland Kitchen continue meds+better diet (he stopped Glimep due to low BS) ~  labs 8/10 showed BS= 145, A1c= 7.6.Marland Kitchen. rec> add ONGLYZA 5mg /d... ~  labs 1/11 showed BS= 116, A1c= 7.4 ~  labs 8/11 showed BS= 134, A1c= 7.4 ~  f/u Ophthalmology check 1/12 by DrMcFarland was neg- no retinopathy. ~  labs 2/12 showed BS= 162, A1c= 8.9.Marland Kitchen. rec> add back GLIMEPIRIDE 1mg /d. ~  Labs 8/12 showed BS= 107, A1c= 6.7.Marland KitchenMarland Kitchen Continue all 4 meds! ~  Labs 2/13 showed BS= 117, A1c= 7.5.Marland KitchenMarland Kitchen For now continue same & get wt down!!! ~  3/13:  Optometry check by DrMcFarland> early cats, no retinopathy... ~  Labs 8/13 on showed BS= 125, A1c= 7.1 ~  4/14: on Metform500-2Bid, Glimep1mg , Actos45, Onglyza5; Home BS checks are 120-130 he says; Labs 4/14 showed BS=140, A1c=8.2, and we decided to incr his Glimep to 2mg  Qam. ~  11/14: on Metform500-2Bid, Glimep2mg , Actos45, Onglyza5; Home BS checks are ~150 he says; Labs 11/14 showed BS=133, A1c=7.9, and we decided to incr his Glimep to 4mg  Qam.  OVERWEIGHT>> ~  8/12:  Weight up to 237# and we reviewed diet & exercise program needed... ~  2/13:  Weight = 234# but needs to do better & get wt down! ~  8/13:  Weight = 235# and we reviewed diet/ exercise... ~  4/14:  Weigfht = 235# ~  11/14:  Weight = 238#  GERD (ICD-530.81) - uses OTC PRILOSEC 20mg  as needed...  COLONIC POLYPS, HX OF (ICD-V12.72) -  ~  Colonoscopy 10/03 showed 1 sm polyp= 2mm hyperplastic lesion, therefore f/u planned 40yrs... ~  Colonoscopy  10/13 by DrGessner showed 3 diminutive polyps, divertics, and Path= hyperplastic...   DEGENERATIVE JOINT DISEASE (ICD-715.90) - prev shot for tennis elbow... eval left shoulder pain 2/08 by DrNorris- adhesive capsulitis, improved after shot... hx right frozen shoulder in past, improved w/ PT... Had left hand problem evaluated by DrWeingold & placed in a splint w/ improvement...  Hx of HEADACHE (ICD-784.0) - hx cervicogenic HA's in 2001- eval by DrAdelman...  ANXIETY (ICD-300.00)  PSORIASIS (ICD-696.1) - uses Ultravate cream... followed by Elnora Morrison and doing well.   Past Surgical History  Procedure Laterality Date  . Inguinal hernia repair  1989  . Nasal septum surgery  1989    Outpatient Encounter Prescriptions as of 07/07/2013  Medication Sig  . aspirin 81 MG tablet 1 tablet once per week  . fluticasone (FLONASE) 50 MCG/ACT nasal spray 1-2 sprays in each nostril at bedtime  . glimepiride (AMARYL) 2 MG tablet Take 1 tablet (2 mg total) by mouth daily before breakfast.  . lisinopril (PRINIVIL,ZESTRIL) 10 MG tablet TAKE ONE TABLET BY MOUTH ONCE DAILY  . metFORMIN (GLUCOPHAGE) 500 MG tablet Take 1 tablet (500 mg total) by mouth 2 (two) times daily with a meal. (THIS WAS WRONG- taking 2Bid)  . omeprazole (PRILOSEC) 20 MG capsule Take 20 mg by mouth daily as needed. 30 mintues before dinner  . pioglitazone (ACTOS) 45 MG tablet TAKE ONE TABLET BY MOUTH ONCE DAILY  . saxagliptin HCl (ONGLYZA) 5 MG TABS tablet Take 1 tablet (5 mg total) by mouth daily.  . sildenafil (VIAGRA) 100 MG tablet Take 1 tablet (100 mg total) by mouth daily as needed.  . simvastatin (ZOCOR) 40 MG tablet TAKE ONE TABLET BY MOUTH EVERY  DAY AT BEDTIME  . [DISCONTINUED] HYDROcodone-homatropine (HYDROMET) 5-1.5 MG/5ML syrup Take 5 mLs by mouth every 6 (six) hours as needed for cough.    Allergies  Allergen Reactions  . Erythromycin     REACTION: abd pain    Current Medications, Allergies, Past Medical History, Past  Surgical History, Family History, and Social History were reviewed in [employer] record.    Review of Systems    See HPI - all other systems neg except as noted...  The patient denies anorexia, fever, weight loss, weight gain, vision loss, decreased hearing, hoarseness, chest pain, syncope, dyspnea on exertion, peripheral edema, prolonged cough, headaches, hemoptysis, abdominal pain, melena, hematochezia, severe indigestion/heartburn, hematuria, incontinence, muscle weakness, suspicious skin lesions, transient blindness, difficulty walking, depression, unusual weight change, abnormal bleeding, enlarged lymph nodes, and angioedema.   Objective:   Physical Exam    WD, Overweight, 67 y/o WM in NAD... GENERAL:  Alert & oriented; pleasant & cooperative... HEENT:  [place]/AT, EOM-wnl, PERRLA, EACs-clear, TMs-wnl, NOSE-clear, THROAT-clear & wnl. NECK:  Supple w/ fairROM; no JVD; normal carotid impulses w/o bruits; no thyromegaly or nodules palpated; no lymphadenopathy. CHEST:  Clear to P & A; without wheezes/ rales/ or rhonchi heard... HEART:  Regular Rhythm; without murmurs/ rubs/ or gallops detected... ABDOMEN:  Soft & nontender; normal bowel sounds; no organomegaly or masses palpated... EXT: without deformities, mild arthritic changes; no varicose veins/ venous insuffic/ or edema. NEURO:  CN's intact; motor testing normal; sensory testing normal; gait normal & balance OK. DERM:  No lesions noted; he has mild psoriasis...  RADIOLOGY DATA:  Reviewed in the EPIC EMR & discussed w/ the patient...    >>CXR 2/13 showed norm heart size, mild Ao ectasia, clear lungs, min pleural thickening...  LABORATORY DATA:  Reviewed in the EPIC EMR & discussed w/ the patient...    >>LABS 2/13 showed:  FLP- at goal on Simva40;  Chems- ok x BS117 A1c7.5 on ;  CBC- wnl;  TSH=0.55;  PSA=0.71   Assessment & Plan:    Hx Sinusitis>  on OTC Antihist prn, Flonase, Hycodan; some symptoms from  the pollen etc; he had a URI 2/14 treated by TP w/ ZPak, Mucinex, Hycodan & resolved.  CHOL>  FLP looks good on the Simva40 + diet rx, continue same, get weight down...  DM>  BS & A1c are fair- Continue all 4 meds & incr Glimep from 2mg =>4mg  Qam; needs better diet/ exercise/ wt reduction.  GI> GERD, Polyps>  Stable on OTC meds as needed; colonoscopy 2013 showed hyperplastic polyps & f/u planned 77yrs..  DJD>  Hx shoulder & elbow problems, followed by [facility], using OTC analgesics as needed...  Anxiety>  Stress at work etc... He is managing quite well w/o anxiolytic meds & plans retirement 12/14...  PSORIASIS>  Followed by Myrtie Hawk al on Ultravate cream...   Patient's Medications  New Prescriptions   GLIMEPIRIDE (AMARYL) 4 MG TABLET    Take 1 tablet (4 mg total) by mouth daily with breakfast.   SILDENAFIL (REVATIO) 20 MG TABLET    Take 2-5 tablets as directed  Previous Medications   ASPIRIN 81 MG TABLET    1 tablet once per week   FLUTICASONE (FLONASE) 50 MCG/ACT NASAL SPRAY    1-2 sprays in each nostril at bedtime   LISINOPRIL (PRINIVIL,ZESTRIL) 10 MG TABLET    TAKE ONE TABLET BY MOUTH ONCE DAILY   OMEPRAZOLE (PRILOSEC) 20 MG CAPSULE    Take 20 mg by mouth daily as needed. 30  mintues before dinner   PIOGLITAZONE (ACTOS) 45 MG TABLET    TAKE ONE TABLET BY MOUTH ONCE DAILY   SAXAGLIPTIN HCL (ONGLYZA) 5 MG TABS TABLET    Take 1 tablet (5 mg total) by mouth daily.   SIMVASTATIN (ZOCOR) 40 MG TABLET    TAKE ONE TABLET BY MOUTH EVERY DAY AT BEDTIME  Modified Medications   Modified Medication Previous Medication   METFORMIN (GLUCOPHAGE) 500 MG TABLET metFORMIN (GLUCOPHAGE) 500 MG tablet      Take 2 tablets (1,000 mg total) by mouth 2 (two) times daily with a meal.    Take 1 tablet (500 mg total) by mouth 2 (two) times daily with a meal.  Discontinued Medications   GLIMEPIRIDE (AMARYL) 2 MG TABLET    Take 1 tablet (2 mg total) by mouth daily before breakfast.   HYDROCODONE-HOMATROPINE  (HYDROMET) 5-1.5 MG/5ML SYRUP    Take 5 mLs by mouth every 6 (six) hours as needed for cough.   SILDENAFIL (VIAGRA) 100 MG TABLET    Take 1 tablet (100 mg total) by mouth daily as needed.

## 2013-07-17 ENCOUNTER — Other Ambulatory Visit: Payer: Self-pay | Admitting: Pulmonary Disease

## 2013-08-02 ENCOUNTER — Other Ambulatory Visit: Payer: Self-pay | Admitting: Pulmonary Disease

## 2013-09-04 ENCOUNTER — Telehealth: Payer: Self-pay | Admitting: Pulmonary Disease

## 2013-09-04 MED ORDER — GLIMEPIRIDE 4 MG PO TABS: 4.0000 mg | ORAL_TABLET | Freq: Every day | ORAL | Status: DC

## 2013-09-04 NOTE — Telephone Encounter (Signed)
Called and spoke with pt and he stated that he last filled this medication from [facility] but since he has retired he will have to go back to [facility].   Pt is aware that this rx has been sent to the pharmacy.  Nothing further is needed.

## 2013-10-16 ENCOUNTER — Other Ambulatory Visit: Payer: Self-pay | Admitting: Pulmonary Disease

## 2013-10-26 ENCOUNTER — Other Ambulatory Visit: Payer: Self-pay | Admitting: Pulmonary Disease

## 2013-11-10 ENCOUNTER — Telehealth: Payer: Self-pay | Admitting: Pulmonary Disease

## 2013-11-10 DIAGNOSIS — E119 Type 2 diabetes mellitus without complications: Secondary | ICD-10-CM

## 2013-11-10 DIAGNOSIS — E7800 Pure hypercholesterolemia, unspecified: Secondary | ICD-10-CM

## 2013-11-10 DIAGNOSIS — L408 Other psoriasis: Secondary | ICD-10-CM

## 2013-11-10 NOTE — Telephone Encounter (Signed)
Called and spoke with pt and he is aware that we will set him up with a new primary care at the [place] office per pts request.  Order has been placed.

## 2014-01-02 LAB — HM DIABETES EYE EXAM

## 2014-01-10 ENCOUNTER — Ambulatory Visit: Payer: BC Managed Care – PPO | Admitting: Family Medicine

## 2014-01-10 ENCOUNTER — Ambulatory Visit: Payer: BC Managed Care – PPO | Admitting: Pulmonary Disease

## 2014-02-02 ENCOUNTER — Ambulatory Visit (INDEPENDENT_AMBULATORY_CARE_PROVIDER_SITE_OTHER): Payer: Medicare Other | Admitting: Family Medicine

## 2014-02-02 ENCOUNTER — Encounter: Payer: Self-pay | Admitting: Family Medicine

## 2014-02-02 VITALS — BP 130/78 | HR 68 | Temp 98.4°F | Ht 72.0 in | Wt 234.0 lb

## 2014-02-02 DIAGNOSIS — E7800 Pure hypercholesterolemia, unspecified: Secondary | ICD-10-CM

## 2014-02-02 DIAGNOSIS — K219 Gastro-esophageal reflux disease without esophagitis: Secondary | ICD-10-CM

## 2014-02-02 DIAGNOSIS — E119 Type 2 diabetes mellitus without complications: Secondary | ICD-10-CM

## 2014-02-02 DIAGNOSIS — M25569 Pain in unspecified knee: Secondary | ICD-10-CM

## 2014-02-02 DIAGNOSIS — M25561 Pain in right knee: Secondary | ICD-10-CM

## 2014-02-02 DIAGNOSIS — M722 Plantar fascial fibromatosis: Secondary | ICD-10-CM

## 2014-02-02 DIAGNOSIS — E669 Obesity, unspecified: Secondary | ICD-10-CM | POA: Insufficient documentation

## 2014-02-02 LAB — HEPATIC FUNCTION PANEL
ALT: 17 U/L (ref 0–53)
AST: 18 U/L (ref 0–37)
Albumin: 4.2 g/dL (ref 3.5–5.2)
Alkaline Phosphatase: 48 U/L (ref 39–117)
BILIRUBIN DIRECT: 0.1 mg/dL (ref 0.0–0.3)
BILIRUBIN TOTAL: 0.4 mg/dL (ref 0.2–1.2)
Total Protein: 7.4 g/dL (ref 6.0–8.3)

## 2014-02-02 LAB — BASIC METABOLIC PANEL
BUN: 22 mg/dL (ref 6–23)
CALCIUM: 10 mg/dL (ref 8.4–10.5)
CO2: 28 mEq/L (ref 19–32)
CREATININE: 1.1 mg/dL (ref 0.4–1.5)
Chloride: 105 mEq/L (ref 96–112)
GFR: 69.71 mL/min (ref >60.00)
GLUCOSE: 165 mg/dL — AB (ref 70–99)
POTASSIUM: 5.7 meq/L — AB (ref 3.5–5.1)
Sodium: 139 mEq/L (ref 135–145)

## 2014-02-02 LAB — LIPID PANEL
CHOL/HDL RATIO: 3
Cholesterol: 137 mg/dL (ref 0–200)
HDL: 46.6 mg/dL (ref >39.00)
LDL Cholesterol: 73 mg/dL (ref 0–99)
NonHDL: 90.4
TRIGLYCERIDES: 86 mg/dL (ref 0.0–149.0)
VLDL: 17.2 mg/dL (ref 0.0–40.0)

## 2014-02-02 LAB — HEMOGLOBIN A1C: Hgb A1c MFr Bld: 8.2 % — ABNORMAL HIGH (ref 4.6–6.5)

## 2014-02-02 LAB — MICROALBUMIN / CREATININE URINE RATIO
CREATININE, U: 139.1 mg/dL
Microalb Creat Ratio: 1.5 mg/g (ref 0.0–30.0)
Microalb, Ur: 2.1 mg/dL — ABNORMAL HIGH (ref 0.0–1.9)

## 2014-02-02 NOTE — Patient Instructions (Signed)
Try to lose some weight 

## 2014-02-02 NOTE — Progress Notes (Signed)
Pre visit review using our clinic review tool, if applicable. No additional management support is needed unless otherwise documented below in the visit note. 

## 2014-02-02 NOTE — Progress Notes (Signed)
Subjective:    Patient ID: Lucas Warren, male    DOB: [birth year], 68 y.o.   MRN: [id number]  HPI Patient here to establish care.  Problems include type 2 diabetes, dyslipidemia, GERD, osteoarthritis.  Type 2 diabetes. History of somewhat poor control. Last A1c 7.9%. Takes multiple medications which are reviewed. No hypoglycemia. No symptoms of polyuria or polydipsia. Poor compliance with exercise. Has had gradual weight gain over several years. Recent eye exam unremarkable.  Dyslipidemia. Takes simvastatin. No myalgias. No cardiac history. No history of peripheral vascular disease  Recent right knee pain. Accidental fall couple weeks ago. No localized tenderness. Location is medial knee. No effusion. No bruising. No locking or giving way.  Patient also complains of some left heel pain. Worse first thing in the morning. No history of injury. No alleviating factors. Exacerbated by walking after prolonged periods of sitting.  Past Medical History  Diagnosis Date  . Chest pain, atypical   . Hypercholesteremia   . DM (diabetes mellitus)   . GERD (gastroesophageal reflux disease)   . Colon polyps     hyperplastic 2003  . DJD (degenerative joint disease)   . History of headache   . Anxiety   . Psoriasis   . Allergy     seasonal   Past Surgical History  Procedure Laterality Date  . Inguinal hernia repair  1989  . Nasal septum surgery  1989    reports that he quit smoking about 32 years ago. His smoking use included Cigarettes. He has a 40 pack-year smoking history. He has never used smokeless tobacco. He reports that he drinks alcohol. He reports that he does not use illicit drugs. family history includes Alzheimer's disease in his father; Leukemia in his sister; Lung cancer in his mother. Allergies  Allergen Reactions  . Erythromycin     REACTION: abd pain      Review of Systems  Constitutional: Negative for fatigue.  HENT: Negative for trouble swallowing.   Eyes:  Negative for visual disturbance.  Respiratory: Negative for cough, chest tightness and shortness of breath.   Cardiovascular: Negative for chest pain, palpitations and leg swelling.  Gastrointestinal: Negative for abdominal pain.  Endocrine: Negative for polydipsia and polyuria.  Genitourinary: Negative for dysuria and frequency.  Musculoskeletal: Positive for arthralgias.  Skin: Negative for rash.  Neurological: Negative for dizziness, syncope, weakness, light-headedness and headaches.  Psychiatric/Behavioral: Negative for dysphoric mood.       Objective:   Physical Exam  Constitutional: He is oriented to person, place, and time. He appears well-developed and well-nourished.  HENT:  Right Ear: External ear normal.  Left Ear: External ear normal.  Mouth/Throat: Oropharynx is clear and moist.  Eyes: Pupils are equal, round, and reactive to light.  Neck: Neck supple. No thyromegaly present.  Cardiovascular: Normal rate and regular rhythm.   Pulmonary/Chest: Effort normal and breath sounds normal. No respiratory distress. He has no wheezes. He has no rales.  Musculoskeletal: He exhibits no edema.  Right knee reveals no localized tenderness. No ecchymosis. No erythema or warmth. Mild medial joint line tenderness. No effusion. Full range of motion. No crepitus.  Mild tenderness over the left plantar fascia. No Achilles tenderness. No visible erythema or swelling. No ecchymosis.  Neurological: He is alert and oriented to person, place, and time.          Assessment & Plan:  #1 type 2 diabetes. History of somewhat poor control. Recheck A1c. Check urine microalbumin screen. Recent eye exam normal #2  dyslipidemia. Check lipid and hepatic panel #3 right knee pain. Question medial meniscal. Give this another 2-3 weeks. If not improved at that time consider imaging #4 plantar fasciitis left foot. We've recommended regular stretching. Consider icing. Consider heel cups. #5 obesity. We  talked about weight loss strategies. Recommend more consistent walking program

## 2014-02-05 ENCOUNTER — Other Ambulatory Visit: Payer: Self-pay | Admitting: Pulmonary Disease

## 2014-02-07 ENCOUNTER — Telehealth: Payer: Self-pay | Admitting: Family Medicine

## 2014-02-07 MED ORDER — PIOGLITAZONE HCL 45 MG PO TABS: ORAL_TABLET | ORAL | Status: DC

## 2014-02-07 NOTE — Telephone Encounter (Signed)
[facility] [place], [place] - [address] is requesting 90 day re-fill on pioglitazone (ACTOS) 45 MG tablet

## 2014-02-07 NOTE — Telephone Encounter (Signed)
Rx sent to pharmacy   

## 2014-02-08 ENCOUNTER — Other Ambulatory Visit: Payer: Self-pay

## 2014-02-08 MED ORDER — METFORMIN HCL 500 MG PO TABS: ORAL_TABLET | ORAL | Status: DC

## 2014-02-09 ENCOUNTER — Other Ambulatory Visit: Payer: Self-pay | Admitting: Pulmonary Disease

## 2014-02-14 ENCOUNTER — Other Ambulatory Visit: Payer: Self-pay | Admitting: Pulmonary Disease

## 2014-02-15 ENCOUNTER — Telehealth: Payer: Self-pay | Admitting: Family Medicine

## 2014-02-15 MED ORDER — PIOGLITAZONE HCL 45 MG PO TABS: ORAL_TABLET | ORAL | Status: DC

## 2014-02-15 MED ORDER — LISINOPRIL 10 MG PO TABS: ORAL_TABLET | ORAL | Status: DC

## 2014-02-15 NOTE — Telephone Encounter (Signed)
[facility] [place], [place] - [address] is requesting 90 day re-fill on pioglitazone (ACTOS) 45 MG tablet

## 2014-02-15 NOTE — Telephone Encounter (Signed)
Sent to the pharmacy by e-scribe. 

## 2014-02-15 NOTE — Telephone Encounter (Signed)
And 90 day re-fill on lisinopril (PRINIVIL,ZESTRIL) 10 MG tablet

## 2014-02-27 ENCOUNTER — Ambulatory Visit (INDEPENDENT_AMBULATORY_CARE_PROVIDER_SITE_OTHER): Payer: Medicare Other | Admitting: Family Medicine

## 2014-02-27 ENCOUNTER — Encounter: Payer: Self-pay | Admitting: Family Medicine

## 2014-02-27 VITALS — BP 132/74 | HR 76 | Temp 98.3°F | Wt 233.0 lb

## 2014-02-27 DIAGNOSIS — J309 Allergic rhinitis, unspecified: Secondary | ICD-10-CM

## 2014-02-27 DIAGNOSIS — J302 Other seasonal allergic rhinitis: Secondary | ICD-10-CM

## 2014-02-27 MED ORDER — SIMVASTATIN 40 MG PO TABS: ORAL_TABLET | ORAL | Status: DC

## 2014-02-27 MED ORDER — AZELASTINE HCL 0.1 % NA SOLN: 1.0000 | Freq: Two times a day (BID) | NASAL | Status: DC

## 2014-02-27 MED ORDER — METFORMIN HCL 500 MG PO TABS: ORAL_TABLET | ORAL | Status: DC

## 2014-02-27 MED ORDER — SAXAGLIPTIN HCL 5 MG PO TABS: ORAL_TABLET | ORAL | Status: DC

## 2014-02-27 MED ORDER — GLIMEPIRIDE 4 MG PO TABS: 4.0000 mg | ORAL_TABLET | Freq: Every day | ORAL | Status: DC

## 2014-02-27 MED ORDER — LISINOPRIL 10 MG PO TABS: ORAL_TABLET | ORAL | Status: DC

## 2014-02-27 MED ORDER — PIOGLITAZONE HCL 45 MG PO TABS: ORAL_TABLET | ORAL | Status: DC

## 2014-02-27 NOTE — Progress Notes (Signed)
Pre visit review using our clinic review tool, if applicable. No additional management support is needed unless otherwise documented below in the visit note. 

## 2014-02-27 NOTE — Patient Instructions (Signed)
Allergic Rhinitis Allergic rhinitis is when the mucous membranes in the nose respond to allergens. Allergens are particles in the air that cause your body to have an allergic reaction. This causes you to release allergic antibodies. Through a chain of events, these eventually cause you to release histamine into the blood stream. Although meant to protect the body, it is this release of histamine that causes your discomfort, such as frequent sneezing, congestion, and an itchy, runny nose.  CAUSES  Seasonal allergic rhinitis (hay fever) is caused by pollen allergens that may come from grasses, trees, and weeds. Year-round allergic rhinitis (perennial allergic rhinitis) is caused by allergens such as house dust mites, pet dander, and mold spores.  SYMPTOMS   Nasal stuffiness (congestion).  Itchy, runny nose with sneezing and tearing of the eyes. DIAGNOSIS  Your health care provider can help you determine the allergen or allergens that trigger your symptoms. If you and your health care provider are unable to determine the allergen, skin or blood testing may be used. TREATMENT  Allergic rhinitis does not have a cure, but it can be controlled by:  Medicines and allergy shots (immunotherapy).  Avoiding the allergen. Hay fever may often be treated with antihistamines in pill or nasal spray forms. Antihistamines block the effects of histamine. There are over-the-counter medicines that may help with nasal congestion and swelling around the eyes. Check with your health care provider before taking or giving this medicine.  If avoiding the allergen or the medicine prescribed do not work, there are many new medicines your health care provider can prescribe. Stronger medicine may be used if initial measures are ineffective. Desensitizing injections can be used if medicine and avoidance does not work. Desensitization is when a patient is given ongoing shots until the body becomes less sensitive to the allergen.  Make sure you follow up with your health care provider if problems continue. HOME CARE INSTRUCTIONS It is not possible to completely avoid allergens, but you can reduce your symptoms by taking steps to limit your exposure to them. It helps to know exactly what you are allergic to so that you can avoid your specific triggers. SEEK MEDICAL CARE IF:   You have a fever.  You develop a cough that does not stop easily (persistent).  You have shortness of breath.  You start wheezing.  Symptoms interfere with normal daily activities. Document Released: 05/12/2001 Document Revised: 08/22/2013 Document Reviewed: 04/24/2013 [facility] Patient Information 2015 [place], [place]. This information is not intended to replace advice given to you by your health care provider. Make sure you discuss any questions you have with your health care provider.  Get back on Zyrtec and be in touch if symptoms no better in 1-2 weeks.

## 2014-02-27 NOTE — Progress Notes (Signed)
   Subjective:    Patient ID: Lucas Warren, male    DOB: 1946-02-02, 68 y.o.   MRN: [id number]  Cough Pertinent negatives include no chills, fever, headaches or sore throat.   Patient seen with cough and sinus congestion for over one week. He initially developed some severe cough which has not improved with Mucinex and eventually tried Tussen DM Which did help. He has had no fever. He has frequent clear nasal discharge and also frequent itching and watering of eyes. He's had some increased sneezing. He has previously taken Zyrtec but has not been on this for the past couple of weeks. Denies any focal facial pain. He tends to have seasonal recurrence this time of year   Review of Systems  Constitutional: Negative for fever and chills.  HENT: Positive for congestion. Negative for sore throat.   Respiratory: Positive for cough.   Neurological: Negative for headaches.       Objective:   Physical Exam  Constitutional: He appears well-developed and well-nourished.  HENT:  Right Ear: External ear normal.  Left Ear: External ear normal.  Mouth/Throat: Oropharynx is clear and moist.  Neck: Neck supple. No thyromegaly present.  Cardiovascular: Normal rate and regular rhythm.   Pulmonary/Chest: Effort normal and breath sounds normal. No respiratory distress. He has no wheezes. He has no rales.  Lymphadenopathy:    He has no cervical adenopathy.          Assessment & Plan:  Seasonal allergic rhinitis. Astelin nasal 1-2 sprays per nostril twice daily. Get back on Zyrtec. Touch base one to 2 weeks if no better

## 2014-05-03 ENCOUNTER — Encounter: Payer: Self-pay | Admitting: Internal Medicine

## 2014-05-04 ENCOUNTER — Encounter: Payer: Self-pay | Admitting: Family Medicine

## 2014-05-04 ENCOUNTER — Ambulatory Visit (INDEPENDENT_AMBULATORY_CARE_PROVIDER_SITE_OTHER): Payer: Medicare Other | Admitting: Family Medicine

## 2014-05-04 VITALS — BP 130/70 | HR 70 | Temp 98.1°F | Wt 225.0 lb

## 2014-05-04 DIAGNOSIS — E875 Hyperkalemia: Secondary | ICD-10-CM

## 2014-05-04 DIAGNOSIS — Z23 Encounter for immunization: Secondary | ICD-10-CM

## 2014-05-04 DIAGNOSIS — E119 Type 2 diabetes mellitus without complications: Secondary | ICD-10-CM

## 2014-05-04 LAB — BASIC METABOLIC PANEL
BUN: 25 mg/dL — ABNORMAL HIGH (ref 6–23)
CALCIUM: 9.6 mg/dL (ref 8.4–10.5)
CO2: 28 mEq/L (ref 19–32)
Chloride: 106 mEq/L (ref 96–112)
Creatinine, Ser: 1.2 mg/dL (ref 0.4–1.5)
GFR: 63.11 mL/min (ref >60.00)
GLUCOSE: 108 mg/dL — AB (ref 70–99)
POTASSIUM: 5 meq/L (ref 3.5–5.1)
Sodium: 139 mEq/L (ref 135–145)

## 2014-05-04 LAB — HEMOGLOBIN A1C: Hgb A1c MFr Bld: 7.4 % — ABNORMAL HIGH (ref 4.6–6.5)

## 2014-05-04 NOTE — Progress Notes (Signed)
Pre visit review using our clinic review tool, if applicable. No additional management support is needed unless otherwise documented below in the visit note. 

## 2014-05-04 NOTE — Progress Notes (Signed)
   Subjective:    Patient ID: Lucas Warren, male    DOB: 1946/02/01, 68 y.o.   MRN: [id number]  HPI Followup type 2 diabetes. Last A1c 8.2%. We increased his metformin to 500 mg 2 twice daily. He started program through [employer] with increased exercise and dietary changes. He has lost about 10 pounds. Fasting blood sugars have gone from 130 to 90-100. Feels well overall Most recent labs revealed potassium 5.7 with refills likely hemolyzed. He does not take any potassium supplement. He is on ACE inhibitor  Past Medical History  Diagnosis Date  . Chest pain, atypical   . Hypercholesteremia   . DM (diabetes mellitus)   . GERD (gastroesophageal reflux disease)   . Colon polyps     hyperplastic 2003  . DJD (degenerative joint disease)   . History of headache   . Anxiety   . Psoriasis   . Allergy     seasonal   Past Surgical History  Procedure Laterality Date  . Inguinal hernia repair  1989  . Nasal septum surgery  1989    reports that he quit smoking about 32 years ago. His smoking use included Cigarettes. He has a 40 pack-year smoking history. He has never used smokeless tobacco. He reports that he drinks alcohol. He reports that he does not use illicit drugs. family history includes Alzheimer's disease in his father; Leukemia in his sister; Lung cancer in his mother. Allergies  Allergen Reactions  . Erythromycin     REACTION: abd pain      Review of Systems  Constitutional: Negative for fatigue.  Eyes: Negative for visual disturbance.  Respiratory: Negative for cough, chest tightness and shortness of breath.   Cardiovascular: Negative for chest pain, palpitations and leg swelling.  Endocrine: Negative for polydipsia and polyuria.  Neurological: Negative for dizziness, syncope, weakness, light-headedness and headaches.       Objective:   Physical Exam  Constitutional: He is oriented to person, place, and time. He appears well-developed and well-nourished.  HENT:    Right Ear: External ear normal.  Left Ear: External ear normal.  Mouth/Throat: Oropharynx is clear and moist.  Eyes: Pupils are equal, round, and reactive to light.  Neck: Neck supple. No thyromegaly present.  Cardiovascular: Normal rate and regular rhythm.   Pulmonary/Chest: Effort normal and breath sounds normal. No respiratory distress. He has no wheezes. He has no rales.  Musculoskeletal: He exhibits no edema.  Neurological: He is alert and oriented to person, place, and time.          Assessment & Plan:  #1 type 2 diabetes. Improved by home readings. He has lost some weight and expect A1c to be improved. Hopefully, we can taper back his glyburide soon #2 history of hyperkalemia by most recent labs. Probably hemolyzed. Recheck basic metabolic panel  #3 health maintenance. Flu vaccine given

## 2014-05-04 NOTE — Addendum Note (Signed)
Addended by: Marcina Millard on: 05/04/2014 08:56 AM   Modules accepted: Orders

## 2014-08-01 ENCOUNTER — Encounter: Payer: Self-pay | Admitting: Family Medicine

## 2014-08-01 ENCOUNTER — Ambulatory Visit (INDEPENDENT_AMBULATORY_CARE_PROVIDER_SITE_OTHER): Payer: Medicare Other | Admitting: Family Medicine

## 2014-08-01 ENCOUNTER — Other Ambulatory Visit: Payer: Self-pay

## 2014-08-01 VITALS — BP 126/72 | HR 66 | Temp 98.5°F | Wt 227.0 lb

## 2014-08-01 DIAGNOSIS — E1165 Type 2 diabetes mellitus with hyperglycemia: Secondary | ICD-10-CM

## 2014-08-01 DIAGNOSIS — E119 Type 2 diabetes mellitus without complications: Secondary | ICD-10-CM

## 2014-08-01 LAB — HEMOGLOBIN A1C: HEMOGLOBIN A1C: 7.1 % — AB (ref 4.6–6.5)

## 2014-08-01 MED ORDER — SAXAGLIPTIN HCL 5 MG PO TABS: ORAL_TABLET | ORAL | Status: DC

## 2014-08-01 NOTE — Patient Instructions (Signed)
Hypoglycemia °Hypoglycemia occurs when the glucose in your blood is too low. Glucose is a type of sugar that is your body's main energy source. Hormones, such as insulin and glucagon, control the level of glucose in the blood. Insulin lowers blood glucose and glucagon increases blood glucose. Having too much insulin in your blood stream, or not eating enough food containing sugar, can result in hypoglycemia. Hypoglycemia can happen to people with or without diabetes. It can develop quickly and can be a medical emergency.  °CAUSES  °· Missing or delaying meals. °· Not eating enough carbohydrates at meals. °· Taking too much diabetes medicine. °· Not timing your oral diabetes medicine or insulin doses with meals, snacks, and exercise. °· Nausea and vomiting. °· Certain medicines. °· Severe illnesses, such as hepatitis, kidney disorders, and certain eating disorders. °· Increased activity or exercise without eating something extra or adjusting medicines. °· Drinking too much alcohol. °· A nerve disorder that affects body functions like your heart rate, blood pressure, and digestion (autonomic neuropathy). °· A condition where the stomach muscles do not function properly (gastroparesis). Therefore, medicines and food may not absorb properly. °· Rarely, a tumor of the pancreas can produce too much insulin. °SYMPTOMS  °· Hunger. °· Sweating (diaphoresis). °· Change in body temperature. °· Shakiness. °· Headache. °· Anxiety. °· Lightheadedness. °· Irritability. °· Difficulty concentrating. °· Dry mouth. °· Tingling or numbness in the hands or feet. °· Restless sleep or sleep disturbances. °· Altered speech and coordination. °· Change in mental status. °· Seizures or prolonged convulsions. °· Combativeness. °· Drowsiness (lethargic). °· Weakness. °· Increased heart rate or palpitations. °· Confusion. °· Pale, gray skin color. °· Blurred or double vision. °· Fainting. °DIAGNOSIS  °A physical exam and medical history will be  performed. Your caregiver may make a diagnosis based on your symptoms. Blood tests and other lab tests may be performed to confirm a diagnosis. Once the diagnosis is made, your caregiver will see if your signs and symptoms go away once your blood glucose is raised.  °TREATMENT  °Usually, you can easily treat your hypoglycemia when you notice symptoms. °· Check your blood glucose. If it is less than 70 mg/dl, take one of the following:   °¨ 3-4 glucose tablets.   °¨ ½ cup juice.   °¨ ½ cup regular soda.   °¨ 1 cup skim milk.   °¨ ½-1 tube of glucose gel.   °¨ 5-6 hard candies.   °· Avoid high-fat drinks or food that may delay a rise in blood glucose levels. °· Do not take more than the recommended amount of sugary foods, drinks, gel, or tablets. Doing so will cause your blood glucose to go too high.   °· Wait 10-15 minutes and recheck your blood glucose. If it is still less than 70 mg/dl or below your target range, repeat treatment.   °· Eat a snack if it is more than 1 hour until your next meal.   °There may be a time when your blood glucose may go so low that you are unable to treat yourself at home when you start to notice symptoms. You may need someone to help you. You may even faint or be unable to swallow. If you cannot treat yourself, someone will need to bring you to the hospital.  °HOME CARE INSTRUCTIONS °· If you have diabetes, follow your diabetes management plan by: °¨ Taking your medicines as directed. °¨ Following your exercise plan. °¨ Following your meal plan. Do not skip meals. Eat on time. °¨ Testing your blood   glucose regularly. Check your blood glucose before and after exercise. If you exercise longer or different than usual, be sure to check blood glucose more frequently. °¨ Wearing your medical alert jewelry that says you have diabetes. °· Identify the cause of your hypoglycemia. Then, develop ways to prevent the recurrence of hypoglycemia. °· Do not take a hot bath or shower right after an  insulin shot. °· Always carry treatment with you. Glucose tablets are the easiest to carry. °· If you are going to drink alcohol, drink it only with meals. °· Tell friends or family members ways to keep you safe during a seizure. This may include removing hard or sharp objects from the area or turning you on your side. °· Maintain a healthy weight. °SEEK MEDICAL CARE IF:  °· You are having problems keeping your blood glucose in your target range. °· You are having frequent episodes of hypoglycemia. °· You feel you might be having side effects from your medicines. °· You are not sure why your blood glucose is dropping so low. °· You notice a change in vision or a new problem with your vision. °SEEK IMMEDIATE MEDICAL CARE IF:  °· Confusion develops. °· A change in mental status occurs. °· The inability to swallow develops. °· Fainting occurs. °Document Released: 08/17/2005 Document Revised: 08/22/2013 Document Reviewed: 12/14/2011 °[employer]® Patient Information ©2015 [employer]. This information is not intended to replace advice given to you by your health care provider. Make sure you discuss any questions you have with your health care provider. ° °

## 2014-08-01 NOTE — Progress Notes (Signed)
   Subjective:    Patient ID: Lucas Warren, male    DOB: 09/11/1945, 68 y.o.   MRN: [id number]  HPI  Patient seen for medical follow-up. His weight is the same essentially from last visit but he states he is losing some inches. He is exercising fairly regularly. He's had occasional hypoglycemia with 2 episodes in the past week with blood sugars around 60. He takes several medications outlined including Amaryl 4 mg daily. His fasting blood sugars have been fairly consistent and again occasionally low as above. Overall he feels good and improved after losing about 10 pounds.  Past Medical History  Diagnosis Date  . Chest pain, atypical   . Hypercholesteremia   . DM (diabetes mellitus)   . GERD (gastroesophageal reflux disease)   . Colon polyps     hyperplastic 2003  . DJD (degenerative joint disease)   . History of headache   . Anxiety   . Psoriasis   . Allergy     seasonal   Past Surgical History  Procedure Laterality Date  . Inguinal hernia repair  1989  . Nasal septum surgery  1989    reports that he quit smoking about 32 years ago. His smoking use included Cigarettes. He has a 40 pack-year smoking history. He has never used smokeless tobacco. He reports that he drinks alcohol. He reports that he does not use illicit drugs. family history includes Alzheimer's disease in his father; Leukemia in his sister; Lung cancer in his mother. Allergies  Allergen Reactions  . Erythromycin     REACTION: abd pain     Review of Systems  Respiratory: Negative for shortness of breath.   Cardiovascular: Negative for chest pain.  Endocrine: Negative for polydipsia and polyuria.       Objective:   Physical Exam  Constitutional: He appears well-developed and well-nourished.  Cardiovascular: Normal rate and regular rhythm.   Pulmonary/Chest: Effort normal and breath sounds normal. No respiratory distress. He has no wheezes. He has no rales.  Musculoskeletal: He exhibits no edema.           Assessment & Plan:  Type 2 diabetes. History of suboptimal control but improving. Last A1c 7.4%. His weight is not down but he's had some reduction in inches around waist. Occasional hypoglycemia. Reduce Amaryl to 2 mg daily. Recheck A1c. Hopefully can taper off Amaryl altogether

## 2014-08-01 NOTE — Progress Notes (Signed)
Pre visit review using our clinic review tool, if applicable. No additional management support is needed unless otherwise documented below in the visit note. 

## 2014-08-03 ENCOUNTER — Ambulatory Visit: Payer: Medicare Other | Admitting: Family Medicine

## 2014-08-21 ENCOUNTER — Ambulatory Visit (INDEPENDENT_AMBULATORY_CARE_PROVIDER_SITE_OTHER): Payer: Medicare Other | Admitting: Family Medicine

## 2014-08-21 ENCOUNTER — Encounter: Payer: Self-pay | Admitting: Family Medicine

## 2014-08-21 DIAGNOSIS — Z23 Encounter for immunization: Secondary | ICD-10-CM

## 2014-08-21 DIAGNOSIS — Z0000 Encounter for general adult medical examination without abnormal findings: Secondary | ICD-10-CM

## 2014-08-21 DIAGNOSIS — Z125 Encounter for screening for malignant neoplasm of prostate: Secondary | ICD-10-CM

## 2014-08-21 LAB — PSA: PSA: 1.13 ng/mL (ref 0.10–4.00)

## 2014-08-21 LAB — HM DIABETES FOOT EXAM: HM Diabetic Foot Exam: NORMAL

## 2014-08-21 NOTE — Progress Notes (Signed)
Pre visit review using our clinic review tool, if applicable. No additional management support is needed unless otherwise documented below in the visit note. 

## 2014-08-21 NOTE — Progress Notes (Signed)
Subjective:    Patient ID: Lucas Warren, male    DOB: 03-26-1946, 68 y.o.   MRN: [id number]  HPI  patient seen for complete physical P review his chronic problems including obesity, type 2 diabetes, hyperlipidemia, GERD, osteoarthritis. He started pertussis up eating with weight loss program and exercise program through [facility] and has done well. He has gradually reduced his A1C to 7.1 percent.   Poor compliance with diet and exercise past couple weeks but overall improved. Needs Prevnar 13. Immunizations are up-to-date. No history of shingles vaccine but he states he has not had chickenpox previously. Nonsmoker.   Reviewed with no major changes:  Past Medical History  Diagnosis Date  . Chest pain, atypical   . Hypercholesteremia   . DM (diabetes mellitus)   . GERD (gastroesophageal reflux disease)   . Colon polyps     hyperplastic 2003  . DJD (degenerative joint disease)   . History of headache   . Anxiety   . Psoriasis   . Allergy     seasonal   Past Surgical History  Procedure Laterality Date  . Inguinal hernia repair  1989  . Nasal septum surgery  1989    reports that he quit smoking about 32 years ago. His smoking use included Cigarettes. He has a 40 pack-year smoking history. He has never used smokeless tobacco. He reports that he drinks alcohol. He reports that he does not use illicit drugs. family history includes Alzheimer's disease in his father; Leukemia in his sister; Lung cancer in his mother. Allergies  Allergen Reactions  . Erythromycin     REACTION: abd pain      Review of Systems  Constitutional: Negative for fever, activity change, appetite change and fatigue.  HENT: Negative for congestion, ear pain and trouble swallowing.   Eyes: Negative for pain and visual disturbance.  Respiratory: Negative for cough, shortness of breath and wheezing.   Cardiovascular: Negative for chest pain and palpitations.  Gastrointestinal: Negative for nausea, vomiting,  abdominal pain, diarrhea, constipation, blood in stool, abdominal distention and rectal pain.  Endocrine: Negative for polydipsia and polyuria.  Genitourinary: Negative for dysuria, hematuria and testicular pain.  Musculoskeletal: Negative for joint swelling and arthralgias.  Skin: Negative for rash.  Neurological: Negative for dizziness, syncope and headaches.  Hematological: Negative for adenopathy.  Psychiatric/Behavioral: Negative for confusion and dysphoric mood.       Objective:   Physical Exam  Constitutional: He is oriented to person, place, and time. He appears well-developed and well-nourished. No distress.  HENT:  Head: Normocephalic and atraumatic.  Right Ear: External ear normal.  Left Ear: External ear normal.  Mouth/Throat: Oropharynx is clear and moist.  Eyes: Conjunctivae and EOM are normal. Pupils are equal, round, and reactive to light.  Neck: Normal range of motion. Neck supple. No thyromegaly present.  Cardiovascular: Normal rate, regular rhythm and normal heart sounds.   No murmur heard. Pulmonary/Chest: No respiratory distress. He has no wheezes. He has no rales.  Abdominal: Soft. Bowel sounds are normal. He exhibits no distension and no mass. There is no tenderness. There is no rebound and no guarding.  Musculoskeletal: He exhibits no edema.  Lymphadenopathy:    He has no cervical adenopathy.  Neurological: He is alert and oriented to person, place, and time. He displays normal reflexes. No cranial nerve deficit.  Skin: No rash noted.  Psychiatric: He has a normal mood and affect.          Assessment & Plan:  Complete physical. Prevnar 13 given. Other immunizations up-to-date. He has not had shingles vaccine but he states he has not had chickenpox previously. Obtain PSA after discussing pros and cons. Continue weight loss efforts. Follow-up 4 months and repeat A1c then

## 2014-08-21 NOTE — Addendum Note (Signed)
Addended by: Marcina Millard on: 08/21/2014 09:39 AM   Modules accepted: Orders

## 2014-12-03 ENCOUNTER — Ambulatory Visit (INDEPENDENT_AMBULATORY_CARE_PROVIDER_SITE_OTHER): Payer: Medicare Other | Admitting: Family Medicine

## 2014-12-03 ENCOUNTER — Encounter: Payer: Self-pay | Admitting: Family Medicine

## 2014-12-03 VITALS — BP 130/70 | HR 71 | Temp 98.6°F | Wt 233.0 lb

## 2014-12-03 DIAGNOSIS — B9789 Other viral agents as the cause of diseases classified elsewhere: Principal | ICD-10-CM

## 2014-12-03 DIAGNOSIS — J069 Acute upper respiratory infection, unspecified: Secondary | ICD-10-CM

## 2014-12-03 NOTE — Patient Instructions (Signed)

## 2014-12-03 NOTE — Progress Notes (Signed)
   Subjective:    Patient ID: Lucas Warren, male    DOB: 1946/02/27, 69 y.o.   MRN: [id number]  HPI Acute visit. Patient seen with cough and nasal congestion. About 2 weeks ago had similar symptoms and felt he recovered but then last week and had a relapse. Cough productive of yellow sputum. Had some clear nasal drainage. No headaches. No fever. Using NyQuil at night with good cough suppression. Former smoker. Quit 1983. Type 2 diabetes which is been well controlled. Last A1c 7.1%  Past Medical History  Diagnosis Date  . Chest pain, atypical   . Hypercholesteremia   . DM (diabetes mellitus)   . GERD (gastroesophageal reflux disease)   . Colon polyps     hyperplastic 2003  . DJD (degenerative joint disease)   . History of headache   . Anxiety   . Psoriasis   . Allergy     seasonal   Past Surgical History  Procedure Laterality Date  . Inguinal hernia repair  1989  . Nasal septum surgery  1989    reports that he quit smoking about 33 years ago. His smoking use included Cigarettes. He has a 40 pack-year smoking history. He has never used smokeless tobacco. He reports that he drinks alcohol. He reports that he does not use illicit drugs. family history includes Alzheimer's disease in his father; Leukemia in his sister; Lung cancer in his mother. Allergies  Allergen Reactions  . Erythromycin     REACTION: abd pain      Review of Systems  Constitutional: Negative for fever and chills.  HENT: Positive for congestion. Negative for sore throat.   Respiratory: Positive for cough.        Objective:   Physical Exam  Constitutional: He appears well-developed and well-nourished. No distress.  HENT:  Right Ear: External ear normal.  Left Ear: External ear normal.  Mouth/Throat: Oropharynx is clear and moist.  Neck: Neck supple. No thyromegaly present.  Cardiovascular: Normal rate and regular rhythm.   Pulmonary/Chest: Effort normal and breath sounds normal. No respiratory  distress. He has no wheezes. He has no rales.  Lymphadenopathy:    He has no cervical adenopathy.          Assessment & Plan:  Viral URI with cough. Reassurance. Continue over-the-counter cough medication as needed. Follow-up promptly for fever or worsening symptoms

## 2014-12-03 NOTE — Progress Notes (Signed)
Pre visit review using our clinic review tool, if applicable. No additional management support is needed unless otherwise documented below in the visit note. 

## 2014-12-24 ENCOUNTER — Ambulatory Visit (INDEPENDENT_AMBULATORY_CARE_PROVIDER_SITE_OTHER): Payer: Medicare Other | Admitting: Family Medicine

## 2014-12-24 ENCOUNTER — Encounter: Payer: Self-pay | Admitting: Family Medicine

## 2014-12-24 VITALS — BP 130/66 | HR 61 | Temp 98.1°F | Wt 233.0 lb

## 2014-12-24 DIAGNOSIS — K219 Gastro-esophageal reflux disease without esophagitis: Secondary | ICD-10-CM

## 2014-12-24 DIAGNOSIS — E119 Type 2 diabetes mellitus without complications: Secondary | ICD-10-CM

## 2014-12-24 DIAGNOSIS — E1165 Type 2 diabetes mellitus with hyperglycemia: Secondary | ICD-10-CM

## 2014-12-24 DIAGNOSIS — E7800 Pure hypercholesterolemia, unspecified: Secondary | ICD-10-CM

## 2014-12-24 LAB — HEPATIC FUNCTION PANEL
ALT: 9 U/L (ref 0–53)
AST: 11 U/L (ref 0–37)
Albumin: 4.3 g/dL (ref 3.5–5.2)
Alkaline Phosphatase: 55 U/L (ref 39–117)
Bilirubin, Direct: 0.1 mg/dL (ref 0.0–0.3)
Total Bilirubin: 0.4 mg/dL (ref 0.2–1.2)
Total Protein: 7.3 g/dL (ref 6.0–8.3)

## 2014-12-24 LAB — BASIC METABOLIC PANEL
BUN: 23 mg/dL (ref 6–23)
CALCIUM: 9.7 mg/dL (ref 8.4–10.5)
CO2: 28 meq/L (ref 19–32)
Chloride: 105 mEq/L (ref 96–112)
Creatinine, Ser: 1.16 mg/dL (ref 0.40–1.50)
GFR: 66.76 mL/min (ref >60.00)
GLUCOSE: 136 mg/dL — AB (ref 70–99)
POTASSIUM: 4.4 meq/L (ref 3.5–5.1)
SODIUM: 137 meq/L (ref 135–145)

## 2014-12-24 LAB — HM DIABETES FOOT EXAM: HM Diabetic Foot Exam: NORMAL

## 2014-12-24 LAB — LIPID PANEL
Cholesterol: 141 mg/dL (ref 0–200)
HDL: 43.4 mg/dL (ref >39.00)
LDL Cholesterol: 75 mg/dL (ref 0–99)
NONHDL: 97.6
Total CHOL/HDL Ratio: 3
Triglycerides: 113 mg/dL (ref 0.0–149.0)
VLDL: 22.6 mg/dL (ref 0.0–40.0)

## 2014-12-24 LAB — HEMOGLOBIN A1C: Hgb A1c MFr Bld: 7.5 % — ABNORMAL HIGH (ref 4.6–6.5)

## 2014-12-24 NOTE — Progress Notes (Signed)
Pre visit review using our clinic review tool, if applicable. No additional management support is needed unless otherwise documented below in the visit note. 

## 2014-12-24 NOTE — Progress Notes (Signed)
   Subjective:    Patient ID: Lucas Warren, male    DOB: [birth year], 69 y.o.   MRN: [id number]  HPI Medical follow-up  Type 2 diabetes. Most recent A1c 7.1%. Less active over the recent months. Blood sugars were climbing slightly by fastings and he recently increased his Amaryl to 4 mg daily. Had been taking half a tablet. No recent polyuria or polydipsia. No history of neuropathy.  Hyperlipidemia treated with simvastatin. No myalgias. No recent chest pains.  History of GERD which has been controlled with as needed omeprazole. He has been able to avoid taking this daily. No recent dysphagia  Past Medical History  Diagnosis Date  . Chest pain, atypical   . Hypercholesteremia   . DM (diabetes mellitus)   . GERD (gastroesophageal reflux disease)   . Colon polyps     hyperplastic 2003  . DJD (degenerative joint disease)   . History of headache   . Anxiety   . Psoriasis   . Allergy     seasonal   Past Surgical History  Procedure Laterality Date  . Inguinal hernia repair  1989  . Nasal septum surgery  1989    reports that he quit smoking about 33 years ago. His smoking use included Cigarettes. He has a 40 pack-year smoking history. He has never used smokeless tobacco. He reports that he drinks alcohol. He reports that he does not use illicit drugs. family history includes Alzheimer's disease in his father; Leukemia in his sister; Lung cancer in his mother. Allergies  Allergen Reactions  . Erythromycin     REACTION: abd pain      Review of Systems  Constitutional: Negative for fatigue.  Eyes: Negative for visual disturbance.  Respiratory: Negative for cough, chest tightness and shortness of breath.   Cardiovascular: Negative for chest pain, palpitations and leg swelling.  Endocrine: Negative for polydipsia and polyuria.  Genitourinary: Negative for dysuria.  Neurological: Negative for dizziness, syncope, weakness, light-headedness and headaches.       Objective:   Physical Exam  Constitutional: He appears well-developed and well-nourished.  Neck: Neck supple. No thyromegaly present.  Cardiovascular: Normal rate and regular rhythm.   Pulmonary/Chest: Effort normal and breath sounds normal. No respiratory distress. He has no wheezes. He has no rales.  Musculoskeletal: He exhibits no edema.  Skin:  Feet reveal no skin lesions. Good distal foot pulses. Good capillary refill. No calluses. Normal sensation with monofilament testing           Assessment & Plan:  #1 type 2 diabetes. History of fair control. Recheck A1c. Encouraged to do more exercise #2 dyslipidemia. Check lipid and hepatic panel. Continue simvastatin #3 GERD stable on as needed omeprazole.

## 2015-01-25 DIAGNOSIS — E119 Type 2 diabetes mellitus without complications: Secondary | ICD-10-CM | POA: Diagnosis not present

## 2015-01-25 LAB — HM DIABETES EYE EXAM

## 2015-02-04 ENCOUNTER — Encounter: Payer: Self-pay | Admitting: Family Medicine

## 2015-02-28 ENCOUNTER — Other Ambulatory Visit: Payer: Self-pay | Admitting: Family Medicine

## 2015-03-06 ENCOUNTER — Telehealth: Payer: Self-pay | Admitting: Family Medicine

## 2015-03-06 NOTE — Telephone Encounter (Signed)
Pt call to say that he is having muscle pain and running nose. He said he think he is having side effects from the following med. He said he is concern that the medicine is doing more damage than good. Pt would like a call back    simvastatin (ZOCOR) 40 MG tablet

## 2015-03-06 NOTE — Telephone Encounter (Signed)
Hold the Simvastatin for 3 weeks.  Let me know if his pain resolves off the Simvastatin.

## 2015-03-07 NOTE — Telephone Encounter (Signed)
Called and left message for patient to return call.  

## 2015-03-08 NOTE — Telephone Encounter (Signed)
Left message for patient to return call.

## 2015-03-08 NOTE — Telephone Encounter (Signed)
Pt informed. Verbally understands

## 2015-03-11 ENCOUNTER — Other Ambulatory Visit: Payer: Self-pay | Admitting: Family Medicine

## 2015-03-16 ENCOUNTER — Other Ambulatory Visit: Payer: Self-pay | Admitting: Family Medicine

## 2015-03-17 DIAGNOSIS — L03012 Cellulitis of left finger: Secondary | ICD-10-CM | POA: Diagnosis not present

## 2015-04-23 DIAGNOSIS — L821 Other seborrheic keratosis: Secondary | ICD-10-CM | POA: Diagnosis not present

## 2015-04-23 DIAGNOSIS — D692 Other nonthrombocytopenic purpura: Secondary | ICD-10-CM | POA: Diagnosis not present

## 2015-04-23 DIAGNOSIS — D1801 Hemangioma of skin and subcutaneous tissue: Secondary | ICD-10-CM | POA: Diagnosis not present

## 2015-04-23 DIAGNOSIS — Z85828 Personal history of other malignant neoplasm of skin: Secondary | ICD-10-CM | POA: Diagnosis not present

## 2015-04-23 DIAGNOSIS — D2372 Other benign neoplasm of skin of left lower limb, including hip: Secondary | ICD-10-CM | POA: Diagnosis not present

## 2015-05-06 ENCOUNTER — Other Ambulatory Visit: Payer: Self-pay | Admitting: Family Medicine

## 2015-05-22 ENCOUNTER — Ambulatory Visit (INDEPENDENT_AMBULATORY_CARE_PROVIDER_SITE_OTHER): Payer: Medicare Other | Admitting: Family Medicine

## 2015-05-22 ENCOUNTER — Encounter: Payer: Self-pay | Admitting: Family Medicine

## 2015-05-22 VITALS — BP 130/74 | HR 67 | Temp 97.8°F | Ht 72.0 in | Wt 238.9 lb

## 2015-05-22 DIAGNOSIS — Z23 Encounter for immunization: Secondary | ICD-10-CM | POA: Diagnosis not present

## 2015-05-22 DIAGNOSIS — E7800 Pure hypercholesterolemia, unspecified: Secondary | ICD-10-CM

## 2015-05-22 DIAGNOSIS — E1165 Type 2 diabetes mellitus with hyperglycemia: Secondary | ICD-10-CM

## 2015-05-22 DIAGNOSIS — E119 Type 2 diabetes mellitus without complications: Secondary | ICD-10-CM | POA: Diagnosis not present

## 2015-05-22 LAB — LIPID PANEL
CHOLESTEROL: 217 mg/dL — AB (ref 0–200)
HDL: 41.6 mg/dL (ref >39.00)
LDL Cholesterol: 146 mg/dL — ABNORMAL HIGH (ref 0–99)
NonHDL: 175.35
TRIGLYCERIDES: 149 mg/dL (ref 0.0–149.0)
Total CHOL/HDL Ratio: 5
VLDL: 29.8 mg/dL (ref 0.0–40.0)

## 2015-05-22 LAB — HEMOGLOBIN A1C: Hgb A1c MFr Bld: 7.3 % — ABNORMAL HIGH (ref 4.6–6.5)

## 2015-05-22 NOTE — Progress Notes (Signed)
   Subjective:    Patient ID: Lucas Warren, male    DOB: 26-Oct-1945, 69 y.o.   MRN: [id number]  HPI Patient seen for medical follow-up.  Hyperlipidemia. Had been taking simvastatin. He did develop some bilateral biceps pain and stopped the simvastatin. This is about 6 weeks ago. His muscle pain did not reduce but he did notice improvement in fasting blood sugars from around 120 to 95. He's been off the simvastatin now for about 6 weeks.  Type 2 diabetes. Recent suboptimal control. Recent A1c 7.5%. Takes multiple medications including Amaryl, metformin, Actos, Onglyza. No recent hypoglycemia. No polyuria or polydipsia. Recent eye exam normal.  Patient takes lisinopril .  Apparently has not had significant blood pressure issues and was put on this as a "preventative "  Needs flu vaccine. He declines shingles vaccine stating that he has never had chickenpox.  Past Medical History  Diagnosis Date  . Chest pain, atypical   . Hypercholesteremia   . DM (diabetes mellitus)   . GERD (gastroesophageal reflux disease)   . Colon polyps     hyperplastic 2003  . DJD (degenerative joint disease)   . History of headache   . Anxiety   . Psoriasis   . Allergy     seasonal   Past Surgical History  Procedure Laterality Date  . Inguinal hernia repair  1989  . Nasal septum surgery  1989    reports that he quit smoking about 33 years ago. His smoking use included Cigarettes. He has a 40 pack-year smoking history. He has never used smokeless tobacco. He reports that he drinks alcohol. He reports that he does not use illicit drugs. family history includes Alzheimer's disease in his father; Leukemia in his sister; Lung cancer in his mother. Allergies  Allergen Reactions  . Erythromycin     REACTION: abd pain      Review of Systems  Constitutional: Negative for fatigue and unexpected weight change.  Eyes: Negative for visual disturbance.  Respiratory: Negative for cough, chest tightness and  shortness of breath.   Cardiovascular: Negative for chest pain, palpitations and leg swelling.  Endocrine: Negative for polydipsia and polyuria.  Genitourinary: Negative for dysuria.  Neurological: Negative for dizziness, syncope, weakness, light-headedness and headaches.       Objective:   Physical Exam  Constitutional: He appears well-developed and well-nourished.  Neck: Neck supple. No JVD present.  Cardiovascular: Normal rate and regular rhythm.   Pulmonary/Chest: Effort normal and breath sounds normal. No respiratory distress. He has no wheezes. He has no rales.  Musculoskeletal: He exhibits no edema.  Psychiatric: He has a normal mood and affect. His behavior is normal.          Assessment & Plan:  #1 type 2 diabetes. History of suboptimal control. Recheck A1c. If still elevated consider discontinue Amaryl and start GLP-1 class which hopefully can assist with weight control #2 dyslipidemia. Patient recently stopped simvastatin. He's had some persistent myalgias which he felt was related to his statin. Consider switch to Crestor or pravastatin if lipids remain elevated #3 health maintenance. Flu vaccine given. Patient declines shingles vaccine.

## 2015-05-22 NOTE — Progress Notes (Signed)
Pre visit review using our clinic review tool, if applicable. No additional management support is needed unless otherwise documented below in the visit note. 

## 2015-05-27 MED ORDER — EXENATIDE ER 2 MG ~~LOC~~ SRER: 2.0000 mg | SUBCUTANEOUS | Status: DC

## 2015-05-27 MED ORDER — ROSUVASTATIN CALCIUM 10 MG PO TABS: 10.0000 mg | ORAL_TABLET | Freq: Every day | ORAL | Status: DC

## 2015-05-27 NOTE — Addendum Note (Signed)
Addended by: Ailene Rud E on: 05/27/2015 04:22 PM   Modules accepted: Orders

## 2015-05-27 NOTE — Progress Notes (Signed)
Call pt and send the Rx to the pharmacy

## 2015-06-13 ENCOUNTER — Other Ambulatory Visit: Payer: Self-pay | Admitting: Family Medicine

## 2015-06-22 ENCOUNTER — Other Ambulatory Visit: Payer: Self-pay | Admitting: Family Medicine

## 2015-07-24 ENCOUNTER — Encounter: Payer: Self-pay | Admitting: Family Medicine

## 2015-07-24 ENCOUNTER — Ambulatory Visit (INDEPENDENT_AMBULATORY_CARE_PROVIDER_SITE_OTHER): Payer: Medicare Other | Admitting: Family Medicine

## 2015-07-24 VITALS — BP 120/80 | HR 67 | Temp 98.2°F | Resp 16 | Ht 72.0 in | Wt 241.0 lb

## 2015-07-24 DIAGNOSIS — M79601 Pain in right arm: Secondary | ICD-10-CM | POA: Diagnosis not present

## 2015-07-24 DIAGNOSIS — R208 Other disturbances of skin sensation: Secondary | ICD-10-CM | POA: Diagnosis not present

## 2015-07-24 DIAGNOSIS — E1165 Type 2 diabetes mellitus with hyperglycemia: Secondary | ICD-10-CM | POA: Diagnosis not present

## 2015-07-24 DIAGNOSIS — M79602 Pain in left arm: Secondary | ICD-10-CM

## 2015-07-24 LAB — TSH: TSH: 0.86 u[IU]/mL (ref 0.35–4.50)

## 2015-07-24 LAB — VITAMIN B12: VITAMIN B 12: 336 pg/mL (ref 211–911)

## 2015-07-24 MED ORDER — CELECOXIB 200 MG PO CAPS: 200.0000 mg | ORAL_CAPSULE | Freq: Every day | ORAL | Status: DC

## 2015-07-24 NOTE — Progress Notes (Signed)
   Subjective:    Patient ID: Lucas Warren, male    DOB: 09/21/1945, 69 y.o.   MRN: [id number]  HPI Patient seen with bilateral arm pain. Duration several months. Location is anterior distal arm radiating to the forearm bilaterally. Plays golf frequently but has not had any troubles with golf. Describes a burning quality which is sometimes moderate intensity. He's taken Aleve and Celebrex which seemed to help. He recently thinks he may of had some slight decrease in strength bilaterally. Denies any hand involvement. No neck pain or radiculopathy symptoms. Patient does have type 2 diabetes. Last A1c 7.3%. Denies any peripheral neuropathy symptoms in his legs. Does take metformin and stannous for several years and had read about possible B12 deficiency and started oral placement several months ago low dosage which does not help. He has not had B12 level.  Past Medical History  Diagnosis Date  . Chest pain, atypical   . Hypercholesteremia   . DM (diabetes mellitus) ([place])   . GERD (gastroesophageal reflux disease)   . Colon polyps     hyperplastic 2003  . DJD (degenerative joint disease)   . History of headache   . Anxiety   . Psoriasis   . Allergy     seasonal   Past Surgical History  Procedure Laterality Date  . Inguinal hernia repair  1989  . Nasal septum surgery  1989    reports that he quit smoking about 33 years ago. His smoking use included Cigarettes. He has a 40 pack-year smoking history. He has never used smokeless tobacco. He reports that he drinks alcohol. He reports that he does not use illicit drugs. family history includes Alzheimer's disease in his father; Leukemia in his sister; Lung cancer in his mother. Allergies  Allergen Reactions  . Erythromycin     REACTION: abd pain      Review of Systems  Constitutional: Negative for fatigue and unexpected weight change.  Eyes: Negative for visual disturbance.  Respiratory: Negative for cough, chest tightness and  shortness of breath.   Cardiovascular: Negative for chest pain, palpitations and leg swelling.  Neurological: Positive for numbness. Negative for dizziness, syncope, light-headedness and headaches.  Hematological: Negative for adenopathy.       Objective:   Physical Exam  Constitutional: He appears well-developed and well-nourished.  Cardiovascular: Normal rate and regular rhythm.   Pulmonary/Chest: Effort normal and breath sounds normal. No respiratory distress. He has no wheezes. He has no rales.  Musculoskeletal: He exhibits no edema.  No localized tenderness in either arm. No edema. Full range of motion both elbows and both shoulders. Distal pulses are normal both wrists. No muscle atrophy. He does not have any medial or lateral epicondylar tenderness   Neurological:  Full strength both upper extremities and normal sensory function touch  Skin: No rash noted.          Assessment & Plan:  Bilateral arm pain. Burning quality and symmetric nature suggest possible neuropathy-the location is somewhat odd. He does not have any distal symptoms. Check B12 and TSH. Continue Celebrex which seems to help. We discussed possible other options such as gabapentin or Lyrica but he is reluctant at this point.  Type 2 diabetes. Recent A1c 7.3%. We had recommended trying to start GLP-1 medication but he cannot because of cost.

## 2015-07-24 NOTE — Patient Instructions (Signed)
We will call you with labs May continue the Celebrex Let me know of any progressive pain or weakness.

## 2015-07-24 NOTE — Progress Notes (Signed)
Pre visit review using our clinic review tool, if applicable. No additional management support is needed unless otherwise documented below in the visit note. 

## 2015-07-30 ENCOUNTER — Other Ambulatory Visit: Payer: Self-pay | Admitting: Family Medicine

## 2015-08-09 ENCOUNTER — Other Ambulatory Visit: Payer: Self-pay | Admitting: Family Medicine

## 2015-08-12 ENCOUNTER — Encounter: Payer: Self-pay | Admitting: Cardiology

## 2015-09-30 DIAGNOSIS — D485 Neoplasm of uncertain behavior of skin: Secondary | ICD-10-CM | POA: Diagnosis not present

## 2015-09-30 DIAGNOSIS — L738 Other specified follicular disorders: Secondary | ICD-10-CM | POA: Diagnosis not present

## 2015-09-30 DIAGNOSIS — D2371 Other benign neoplasm of skin of right lower limb, including hip: Secondary | ICD-10-CM | POA: Diagnosis not present

## 2015-09-30 DIAGNOSIS — L821 Other seborrheic keratosis: Secondary | ICD-10-CM | POA: Diagnosis not present

## 2015-10-14 LAB — HEMOGLOBIN A1C: HEMOGLOBIN A1C: 8.4

## 2015-11-01 ENCOUNTER — Other Ambulatory Visit: Payer: Self-pay | Admitting: Family Medicine

## 2015-11-23 ENCOUNTER — Other Ambulatory Visit: Payer: Self-pay | Admitting: Family Medicine

## 2015-12-10 ENCOUNTER — Other Ambulatory Visit: Payer: Self-pay | Admitting: Family Medicine

## 2015-12-25 ENCOUNTER — Ambulatory Visit (INDEPENDENT_AMBULATORY_CARE_PROVIDER_SITE_OTHER): Payer: Medicare Other | Admitting: Family Medicine

## 2015-12-25 VITALS — BP 130/80 | HR 67 | Temp 98.3°F | Ht 72.0 in | Wt 233.7 lb

## 2015-12-25 DIAGNOSIS — R0609 Other forms of dyspnea: Secondary | ICD-10-CM | POA: Diagnosis not present

## 2015-12-25 DIAGNOSIS — E1165 Type 2 diabetes mellitus with hyperglycemia: Secondary | ICD-10-CM

## 2015-12-25 DIAGNOSIS — I10 Essential (primary) hypertension: Secondary | ICD-10-CM

## 2015-12-25 DIAGNOSIS — E7800 Pure hypercholesterolemia, unspecified: Secondary | ICD-10-CM

## 2015-12-25 LAB — LIPID PANEL
CHOL/HDL RATIO: 2
Cholesterol: 118 mg/dL (ref 0–200)
HDL: 47.7 mg/dL (ref >39.00)
LDL Cholesterol: 55 mg/dL (ref 0–99)
NonHDL: 69.84
TRIGLYCERIDES: 73 mg/dL (ref 0.0–149.0)
VLDL: 14.6 mg/dL (ref 0.0–40.0)

## 2015-12-25 LAB — HEPATIC FUNCTION PANEL
ALK PHOS: 46 U/L (ref 39–117)
ALT: 9 U/L (ref 0–53)
AST: 10 U/L (ref 0–37)
Albumin: 4.4 g/dL (ref 3.5–5.2)
BILIRUBIN DIRECT: 0.1 mg/dL (ref 0.0–0.3)
BILIRUBIN TOTAL: 0.4 mg/dL (ref 0.2–1.2)
TOTAL PROTEIN: 7.3 g/dL (ref 6.0–8.3)

## 2015-12-25 LAB — HEMOGLOBIN A1C: Hgb A1c MFr Bld: 8.3 % — ABNORMAL HIGH (ref 4.6–6.5)

## 2015-12-25 NOTE — Progress Notes (Signed)
Subjective:    Patient ID: OCTAVIA BRONKEMA, male    DOB: 07-17-1946, 70 y.o.   MRN: [id number]  HPI Patient seen for medical follow-up to discuss several items as follows  Type 2 diabetes. Last A1c 7.3%. We had recommended discontinue Amaryl and start Bydurion but cost was prohibitive. He remains on metformin, Amaryl, Actos, and Onglyza. Fasting blood sugars consistently around 140. No hypoglycemia.  Dyslipidemia. Poorly controlled last fall. We started Crestor 10 mg once daily. He has had some myalgias but these preceded starting Crestor.  Recent somewhat progressive exertional dyspnea. He has a shop behind his house and this is somewhat of an incline and has noticed increased shortness of breath with walking this. No chest pains. No orthopnea. Patient had nuclear stress test approximately 11 years ago unremarkable. Patient's father had coronary disease in his 57s.  Past Medical History  Diagnosis Date  . Chest pain, atypical   . Hypercholesteremia   . DM (diabetes mellitus) ([place])   . GERD (gastroesophageal reflux disease)   . Colon polyps     hyperplastic 2003  . DJD (degenerative joint disease)   . History of headache   . Anxiety   . Psoriasis   . Allergy     seasonal   Past Surgical History  Procedure Laterality Date  . Inguinal hernia repair  1989  . Nasal septum surgery  1989    reports that he quit smoking about 34 years ago. His smoking use included Cigarettes. He has a 40 pack-year smoking history. He has never used smokeless tobacco. He reports that he drinks alcohol. He reports that he does not use illicit drugs. family history includes Alzheimer's disease in his father; Leukemia in his sister; Lung cancer in his mother. Allergies  Allergen Reactions  . Erythromycin     REACTION: abd pain      Review of Systems  Constitutional: Positive for fatigue. Negative for unexpected weight change.  Eyes: Negative for visual disturbance.  Respiratory: Positive for  shortness of breath. Negative for cough, chest tightness and wheezing.   Cardiovascular: Negative for chest pain, palpitations and leg swelling.  Gastrointestinal: Negative for abdominal pain.  Endocrine: Negative for polydipsia and polyuria.  Musculoskeletal: Positive for myalgias and arthralgias.  Skin: Negative for rash.  Neurological: Negative for dizziness, syncope, weakness, light-headedness and headaches.       Objective:   Physical Exam  Constitutional: He appears well-developed and well-nourished.  HENT:  Mouth/Throat: Oropharynx is clear and moist.  Neck: Neck supple. No thyromegaly present.  Cardiovascular: Normal rate and regular rhythm.  Exam reveals no gallop.   Pulmonary/Chest: Effort normal and breath sounds normal. No respiratory distress. He has no wheezes. He has no rales.  Musculoskeletal: He exhibits no edema.  Lymphadenopathy:    He has no cervical adenopathy.          Assessment & Plan:  #1 type 2 diabetes. History of slightly suboptimal control. Recheck A1c today. If not improving may look at addition of long-acting insulin and getting off Amaryl. Feel he would be a good candidate for GLP-1 class but cost has been prohibitive with his insurance lack of coverage  #2 hypertension stable and at goal  #3 dyslipidemia. Recheck lipid and hepatic panel after starting Crestor recently  #4 exertional dyspnea. Possibly deconditioning. No associated chest pain. Check EKG. Consider follow-up nuclear stress test EKG no acute findings. Normal sinus rhythm. Set up nuclear stress test to further evaluate  [name] MD [facility]

## 2015-12-25 NOTE — Progress Notes (Signed)
Pre visit review using our clinic review tool, if applicable. No additional management support is needed unless otherwise documented below in the visit note. 

## 2015-12-25 NOTE — Patient Instructions (Signed)
Low-Gluten Eating Plan Gluten is a protein that is found in wheat, barley, rye, and some other grains. Some people have a condition that makes them unable to digest gluten. For those people, eating just a small amount of gluten can damage their intestines. This is not a gluten-free eating plan. This low-gluten eating plan is for people who feel better when they eat less gluten. WHAT DO I NEED TO KNOW ABOUT THIS EATING PLAN?  You can eat anything that does not contain wheat or other grains that have gluten.  You can eat anything that is labeled "gluten-free."  Make sure to read food labels.  Eat a variety of foods so you get all of the nutrients that you need.  Avoid processed foods and sauces because many of them contain wheat. To have more control over the ingredients in your meals, consider making food yourself instead of buying prepared foods. WHAT FOODS CAN I EAT? Grains Rice. Bulgur. Quinoa. Corn. Buckwheat. Amaranth. Corn tortillas or taco shells. Oatmeal that is labeled as "gluten free" or "uncontaminated." Vegetables Lettuce. Spinach. Peas. Beets. Cauliflower. Cabbage. Broccoli. Carrots. Tomatoes. Squash. Eggplant. Herbs. Peppers. Onions. Cucumbers. Brussels sprouts. Yams and sweet potatoes. Beans. Lentils. Fruits Bananas. Apples. Oranges. Grapes. Papaya. Mango. Pomegranate. Kiwi. Grapefruit. Cherries. Meats and Other Protein Sources Beef. Pork. Chicken. [place]. Fish. Eggs. Tofu. Beans. Nuts. Lentils. Dairy Milk. Ice cream. Yogurt. Cheese. Cottage cheese. Beverages Water. Coffee. Tea. Juice. Soda. Seltzer water. Condiments Mustard. Relish. Low-fat, low-sugar ketchup. Low-fat, low-sugar barbecue sauce. Vinegar. Low-fat or fat-free mayonnaise. Sweets and Desserts Honey. Sugar. Maple syrup. Fats and Oils Butter. Vegetable oil. Olive oil. Canola oil. [place] oil. Other Arrowroot or cornstarch. Potato flour. The items listed above may not be a complete list of recommended foods  or beverages. Contact your dietitian for more options. WHAT FOODS ARE NOT RECOMMENDED? Grains Wheat. Barley. Rye. Oatmeal. Meat and Other Protein Sources Seitan. Cold cuts. Hotdogs. Salami. Sausages. Beverages Beer. Condiments Malt vinegar. Salad dressing. Soy sauce. Sweets and Desserts Licorice. Brown rice syrup. Pre-made pudding or pudding mixes. Other Bouillon cubes. Canned or boxed pre-made soups or soup packets. Bagged chips, such as potato chips and tortilla chips. Seasoning packets. The items listed above may not be a complete list of foods and beverages to avoid. Contact your dietitian for more information.   This information is not intended to replace advice given to you by your health care provider. Make sure you discuss any questions you have with your health care provider.   Document Released: 01/01/2015 Document Reviewed: 01/01/2015 Elsevier Interactive Patient Education [employer].

## 2015-12-26 ENCOUNTER — Telehealth (HOSPITAL_COMMUNITY): Payer: Self-pay | Admitting: *Deleted

## 2015-12-26 NOTE — Telephone Encounter (Signed)
Patient given detailed instructions per Myocardial Perfusion Study Information Sheet for the test on 12/31/15. Patient notified to arrive 15 minutes early and that it is imperative to arrive on time for appointment to keep from having the test rescheduled.  If you need to cancel or reschedule your appointment, please call the office within 24 hours of your appointment. Failure to do so may result in a cancellation of your appointment, and a $50 no show fee. Patient verbalized understanding.Lillian Ballester J Demara Lover, RN   

## 2015-12-27 ENCOUNTER — Ambulatory Visit: Payer: Medicare Other | Admitting: Family Medicine

## 2015-12-31 ENCOUNTER — Ambulatory Visit (HOSPITAL_COMMUNITY): Payer: Medicare Other | Attending: Internal Medicine

## 2015-12-31 DIAGNOSIS — R9439 Abnormal result of other cardiovascular function study: Secondary | ICD-10-CM | POA: Insufficient documentation

## 2015-12-31 DIAGNOSIS — R0609 Other forms of dyspnea: Secondary | ICD-10-CM | POA: Insufficient documentation

## 2015-12-31 DIAGNOSIS — I10 Essential (primary) hypertension: Secondary | ICD-10-CM | POA: Insufficient documentation

## 2015-12-31 DIAGNOSIS — E119 Type 2 diabetes mellitus without complications: Secondary | ICD-10-CM | POA: Insufficient documentation

## 2015-12-31 DIAGNOSIS — R079 Chest pain, unspecified: Secondary | ICD-10-CM | POA: Diagnosis not present

## 2015-12-31 LAB — MYOCARDIAL PERFUSION IMAGING
CHL CUP RESTING HR STRESS: 57 {beats}/min
CHL RATE OF PERCEIVED EXERTION: 19
CSEPED: 7 min
CSEPEDS: 1 s
CSEPPHR: 141 {beats}/min
Estimated workload: 7.6 METS
LV dias vol: 128 mL (ref 62–150)
LVSYSVOL: 53 mL
MPHR: 153 {beats}/min
NUC STRESS TID: 1.12
Percent HR: 92 %
RATE: 0.33
SDS: 0
SRS: 6
SSS: 6

## 2015-12-31 MED ORDER — TECHNETIUM TC 99M SESTAMIBI GENERIC - CARDIOLITE
32.9000 | Freq: Once | INTRAVENOUS | Status: AC | PRN
  Administered 2015-12-31: 32.9 via INTRAVENOUS

## 2015-12-31 MED ORDER — TECHNETIUM TC 99M SESTAMIBI GENERIC - CARDIOLITE
10.7000 | Freq: Once | INTRAVENOUS | Status: AC | PRN
  Administered 2015-12-31: 11 via INTRAVENOUS

## 2016-01-26 ENCOUNTER — Other Ambulatory Visit: Payer: Self-pay | Admitting: Family Medicine

## 2016-01-30 ENCOUNTER — Encounter: Payer: Self-pay | Admitting: Family Medicine

## 2016-02-05 DIAGNOSIS — H5213 Myopia, bilateral: Secondary | ICD-10-CM | POA: Diagnosis not present

## 2016-02-05 DIAGNOSIS — E119 Type 2 diabetes mellitus without complications: Secondary | ICD-10-CM | POA: Diagnosis not present

## 2016-02-06 ENCOUNTER — Encounter: Payer: Self-pay | Admitting: Family Medicine

## 2016-02-06 ENCOUNTER — Telehealth: Payer: Self-pay | Admitting: Family Medicine

## 2016-02-06 ENCOUNTER — Ambulatory Visit (INDEPENDENT_AMBULATORY_CARE_PROVIDER_SITE_OTHER): Payer: Medicare Other | Admitting: Family Medicine

## 2016-02-06 VITALS — BP 110/80 | HR 62 | Temp 97.7°F | Ht 72.0 in | Wt 231.0 lb

## 2016-02-06 DIAGNOSIS — R053 Chronic cough: Secondary | ICD-10-CM

## 2016-02-06 DIAGNOSIS — R05 Cough: Secondary | ICD-10-CM | POA: Diagnosis not present

## 2016-02-06 DIAGNOSIS — E1165 Type 2 diabetes mellitus with hyperglycemia: Secondary | ICD-10-CM | POA: Diagnosis not present

## 2016-02-06 NOTE — Telephone Encounter (Signed)
[facility] - Client [place] [facility] Patient Name: Lucas Warren DOB: July 22, 1946 Initial Comment caller states his BS was 267 last night and its now 180 Nurse Assessment Nurse: Dimas Chyle, RN, Dellis Filbert Date/Time (Eastern Time): 02/06/2016 8:17:14 AM Confirm and document reason for call. If symptomatic, describe symptoms. You must click the next button to save text entered. ---Caller states his BS was 267 last night and its now 180. Takes Metformin 1000 mg bid, glimiperide 4 mg. Been running up for the last week. Has the patient traveled out of the country within the last 30 days? ---No Does the patient have any new or worsening symptoms? ---Yes Will a triage be completed? ---Yes Related visit to physician within the last 2 weeks? ---No Does the PT have any chronic conditions? (i.e. diabetes, asthma, etc.) ---Yes List chronic conditions. ---HTN, diabetes type 2, hyperlipidemia Is this a behavioral health or substance abuse call? ---No Guidelines Guideline Title Affirmed Question Affirmed Notes Urinary Symptoms Urinating more frequently than usual (i.e., frequency) Cough - Acute Non-Productive Cough present > 10 days Diabetes - High Blood Sugar Blood glucose 60-240 mg/dl (3.5 -13 mmol/ l) (all triage questions negative) Final Disposition User See Physician within 24 Hours Dimas Chyle, RN, [employer] Referrals REFERRED TO PCP OFFICE Disagree/Comply: Comply Disagree/Comply: Comply Disagree/Comply: Comply

## 2016-02-06 NOTE — Patient Instructions (Signed)
Consider Chlorpheniramine (anti-histamine) at night for post-nasal drip symptoms Consider Nasacort for nasal symptoms GLP- 1 medications are Victoza, Tanzeum. Trulicity, or Bydureon.

## 2016-02-06 NOTE — Telephone Encounter (Signed)
Start Trulicity 0.75mg  [place] once weekly He already has office follow up for July.

## 2016-02-06 NOTE — Progress Notes (Signed)
Subjective:    Patient ID: Lucas Warren, male    DOB: 05/31/1946, 70 y.o.   MRN: [id number]  HPI  Patient seen for the following things   Cough for the past 4 weeks.  Dry cough which is both day and night.  No associated dyspnea, fever, chills, or any weight changes.  No obvious wheezing.  No active GERD symptoms.  Frequent postnasal drip symptoms. Tried Mucinex without relief.  Also tried generic Zyrtec without relief. Ex-smoker quit 1983. No recent hemoptysis.   Type 2 diabetes with recent poor control. Increased symptoms recently of nocturia  Increased urine frequency. No burning.  CBG 267 yesterday around  4:30 PM  Recent A1c 8.3%.  Fasting glucose today 180. He takes multiple medications including metformin, Amaryl, Onglyza , Actos and we had suggested looking into GLP-1 class but is not sure he had good coverage. He is reluctant to consider insulin at this time.  Past Medical History  Diagnosis Date  . Chest pain, atypical   . Hypercholesteremia   . DM (diabetes mellitus) ([place])   . GERD (gastroesophageal reflux disease)   . Colon polyps     hyperplastic 2003  . DJD (degenerative joint disease)   . History of headache   . Anxiety   . Psoriasis   . Allergy     seasonal   Past Surgical History  Procedure Laterality Date  . Inguinal hernia repair  1989  . Nasal septum surgery  1989    reports that he quit smoking about 34 years ago. His smoking use included Cigarettes. He has a 40 pack-year smoking history. He has never used smokeless tobacco. He reports that he drinks alcohol. He reports that he does not use illicit drugs. family history includes Alzheimer's disease in his father; Leukemia in his sister; Lung cancer in his mother. Allergies  Allergen Reactions  . Erythromycin     REACTION: abd pain      Review of Systems  Constitutional: Negative for fever, chills, appetite change, fatigue and unexpected weight change.  HENT: Positive for congestion and  postnasal drip. Negative for sore throat.   Respiratory: Positive for cough. Negative for shortness of breath and wheezing.   Cardiovascular: Negative for chest pain and palpitations.  Genitourinary: Positive for frequency. Negative for dysuria and hematuria.       Objective:   Physical Exam  Constitutional: He appears well-developed and well-nourished.  HENT:  Right Ear: External ear normal.  Left Ear: External ear normal.  Mouth/Throat: Oropharynx is clear and moist.  Neck: Neck supple.  Cardiovascular: Normal rate and regular rhythm.   Pulmonary/Chest: Effort normal and breath sounds normal. No respiratory distress. He has no wheezes. He has no rales.  Musculoskeletal: He exhibits no edema.  Lymphadenopathy:    He has no cervical adenopathy.          Assessment & Plan:   #1 persistent cough. Question postnasal drip related. He's had previous nosebleeds on Flonase. Consider Nasacort. Also recommend try over-the-counter chlorpheniramine at night. Touch base if cough not resolving over the next couple of weeks. He does not have any  Red flags such as appetite or weight change, fever, hemoptysis or dyspnea   #2 poorly controlled type 2 diabetes. Multiple medications as above. We have again asked that he check on coverage for GLP-1 class of medication. He will let us know if he has coverage for these medications. We also discussed dietary modification. He has follow-up in July and will recheck A1c  then  [name] MD [facility]

## 2016-02-06 NOTE — Telephone Encounter (Signed)
Pt scheduled to see Dr. Elease Hashimoto today.

## 2016-02-06 NOTE — Telephone Encounter (Signed)
Pt was seen today and the medications that is covered on his insurance are  trulicity and victoza [facility] [place]

## 2016-02-06 NOTE — Progress Notes (Signed)
Pre visit review using our clinic review tool, if applicable. No additional management support is needed unless otherwise documented below in the visit note. 

## 2016-02-07 ENCOUNTER — Other Ambulatory Visit: Payer: Self-pay | Admitting: *Deleted

## 2016-02-07 ENCOUNTER — Telehealth: Payer: Self-pay | Admitting: Family Medicine

## 2016-02-07 MED ORDER — DULAGLUTIDE 0.75 MG/0.5ML ~~LOC~~ SOAJ: 0.7500 mg | SUBCUTANEOUS | Status: DC

## 2016-02-07 MED ORDER — DULAGLUTIDE 0.75 MG/0.5ML ~~LOC~~ SOAJ: 0.7500 [IU] | SUBCUTANEOUS | Status: DC

## 2016-02-07 NOTE — Telephone Encounter (Signed)
Pt is aware via voicemail that medication has been sent to the pharmacy.

## 2016-02-07 NOTE — Telephone Encounter (Signed)
[facility] [place], [place] - [address] [phone] (Phone) [phone] (Fax)       [facility] need for the instructs on this Rx to be changed to 0.75MG  instead of units.  Inject 0.75 Units into the skin once a week.  Dulaglutide (TRULICITY) A999333 [id number] SOPN

## 2016-02-07 NOTE — Telephone Encounter (Signed)
Resent in medication.

## 2016-02-14 ENCOUNTER — Telehealth: Payer: Self-pay | Admitting: Family Medicine

## 2016-02-14 NOTE — Telephone Encounter (Signed)
Note has already been sent to Dr. Elease Hashimoto. I did call the patient and speak with him to assure him that Dr. Elease Hashimoto would review over his call when he finished seeing patients. Patient did ask if he could take his wife's Zofran. I advised him not to take someone else's prescription medication. Patient did state he is aware that Nausea is a side effect of Trulicity.

## 2016-02-14 NOTE — Telephone Encounter (Signed)
Nausea will likely resolve with time.  Lets try some Zofran 4 mg po q 6 hours prn #15 with no refill. If nausea not better in one week let me know.

## 2016-02-14 NOTE — Telephone Encounter (Signed)
Pt states every since he has started the Dulaglutide (TRULICITY) [id number] SOPN    on Monday, he has been nauseous and sick on his stomach.  Pt states he feels like he needs to switch to something else asap, because he is leaving for vacation  tomorrow am  [employer] [place]

## 2016-02-14 NOTE — Telephone Encounter (Signed)
[facility] - Client  [place]    --------------------------------------------------------------------------------   Patient Name: Lucas Warren  Gender: Male  DOB: 02/11/1946   Age: 70 Y 27 M 2 D  Return Phone Number: [phone] (Primary), [phone] (Secondary)  Address:     City/State/Zip:  [place]     Client [facility] - Client  Client Site [facility] - Day  Physician [name] - MD  Contact Type Call  Who Is Calling Patient / Member / Family / Caregiver  Call Type Triage / Clinical  Relationship To Patient Self  Return Phone Number [phone] (Primary)  Chief Complaint Nausea  Reason for Call Symptomatic / Request for [place] states just talked to a triage nurse a few mins ago, medicine was changed on Mon and now has been nauseous...blood sugar 320.  Appointment Disposition EMR Appointment Not Necessary  Info pasted into Epic Yes  PreDisposition Call Doctor  Translation No       Nurse Assessment  Nurse: Venetia Maxon, RN, Manuela Schwartz Date/Time (Eastern Time): 02/14/2016 3:49:36 PM  Confirm and document reason for call. If symptomatic, describe symptoms. You must click the next button to save text entered. ---Caller states just talked to a triage nurse a few mins ago, medicine ( he is on Actos Metformin and Ongliza ) was changed on Mon and now has been nauseous...blood sugar 320. He is supposed to go to [place] is 289. He states the PCP decreased and changed some meds he was to start Trulisity and had a dose Monday . He cont to have "queasiness" No fever . His CBS has been up and down since he began the med.    Has the patient traveled out of the country within the last 30 days? ---No    Does the patient have any new or worsening symptoms? ---Yes    Will a triage be completed? ---Yes    Related  visit to physician within the last 2 weeks? ---Yes    Does the PT have any chronic conditions? (i.e. diabetes, asthma, etc.) ---Yes    List chronic conditions. ---HTN Rubastatin new med he was on Simvastatin. since then his CBS has been going up NIDDM    Is this a behavioral health or substance abuse call? ---No           Guidelines          Guideline Title Affirmed Question Affirmed Notes Nurse Date/Time (Eastern Time)  Diabetes - High Blood Sugar [1] Blood glucose > 300 mg/dl (16.5 mmol/l) AND [2] two or more times in a row    [place], RNManuela Schwartz 02/14/2016 3:53:03 PM    Disp. Time Eilene Ghazi Time) Disposition Final User    02/14/2016 3:21:41 PM Send To Clinical Follow Up Yehuda Budd, RN, Vaughan Basta      02/14/2016 3:54:02 PM Call PCP Now Yes Venetia Maxon, RN, Edwena Bunde Understands: Yes  Disagree/Comply: Comply       Care Advice Given Per Guideline        CALL PCP NOW: You need to discuss this with your doctor. I'll page him now. If you haven't heard from the on-call doctor within 30 minutes, call again. CALL BACK IF: * You become worse. * Vomiting occurs CARE ADVICE given per Diabetes - High Blood Sugar (Adult) guideline.        --------------------------------------------------------------------------------  Comments  User: Willey Blade, RN Date/Time Eilene Ghazi Time): 02/14/2016 4:10:05 PM  I called Roselyn Reef RN at PCP office and gave her detailed report . She will discuss with Dr Elease Hashimoto and call the pt back. I called the pt to let him know the PCP nurse will call him back. He verbalized understanding .    Referrals  REFERRED TO PCP OFFICE

## 2016-02-14 NOTE — Telephone Encounter (Signed)
Notified pt, pt has zofran on hand and will take that, he will let us know if he needs a refill sent in

## 2016-03-02 ENCOUNTER — Encounter: Payer: Self-pay | Admitting: Family Medicine

## 2016-03-02 ENCOUNTER — Ambulatory Visit (INDEPENDENT_AMBULATORY_CARE_PROVIDER_SITE_OTHER): Payer: Medicare Other | Admitting: Family Medicine

## 2016-03-02 VITALS — BP 120/82 | HR 69 | Temp 98.1°F | Resp 16 | Ht 72.0 in | Wt 224.0 lb

## 2016-03-02 DIAGNOSIS — R110 Nausea: Secondary | ICD-10-CM

## 2016-03-02 DIAGNOSIS — Z711 Person with feared health complaint in whom no diagnosis is made: Secondary | ICD-10-CM

## 2016-03-02 NOTE — Progress Notes (Signed)
Pre visit review using our clinic review tool, if applicable. No additional management support is needed unless otherwise documented below in the visit note. 

## 2016-03-02 NOTE — Progress Notes (Signed)
   Subjective:    Patient ID: Lucas Warren, male    DOB: 10-27-1945, 70 y.o.   MRN: [id number]  HPI Patient concerned about possible "neck swelling " He recently went on trulicity and was concerned because of possible association with thyroid cancer. The area in question corresponds to his hyoid cartilage. He has not noted any lymphadenopathy. No difficulty swallowing. No sore throat. He just noticed the area of prominence yesterday.  He has had some low-grade nausea since starting Trulicity. No vomiting. Symptoms relatively mild. Blood sugars have not gone down much yet. He has had some mild weight loss.   Past Medical History  Diagnosis Date  . Chest pain, atypical   . Hypercholesteremia   . DM (diabetes mellitus) ([place])   . GERD (gastroesophageal reflux disease)   . Colon polyps     hyperplastic 2003  . DJD (degenerative joint disease)   . History of headache   . Anxiety   . Psoriasis   . Allergy     seasonal   Past Surgical History  Procedure Laterality Date  . Inguinal hernia repair  1989  . Nasal septum surgery  1989    reports that he quit smoking about 34 years ago. His smoking use included Cigarettes. He has a 40 pack-year smoking history. He has never used smokeless tobacco. He reports that he drinks alcohol. He reports that he does not use illicit drugs. family history includes Alzheimer's disease in his father; Leukemia in his sister; Lung cancer in his mother. Allergies  Allergen Reactions  . Erythromycin     REACTION: abd pain       Review of Systems  HENT: Negative for trouble swallowing.   Respiratory: Negative for shortness of breath.   Cardiovascular: Negative for chest pain.  Gastrointestinal: Positive for nausea. Negative for vomiting and abdominal pain.       Objective:   Physical Exam  Constitutional: He appears well-developed and well-nourished.  HENT:  Mouth/Throat: Oropharynx is clear and moist.  He's had previous tonsillectomy. No  oral masses  Neck: Neck supple. No thyromegaly present.  No adenopathy noted. Area of prominence that he had concerns about corresponds to his hyoid cartilage. No abnormal mass palpated Specifically, no thyroid nodules palpated  Cardiovascular: Normal rate and regular rhythm.   Pulmonary/Chest: Effort normal and breath sounds normal. No respiratory distress. He has no wheezes. He has no rales.  Lymphadenopathy:    He has no cervical adenopathy.          Assessment & Plan:    Patient had concern about possible neck prominence. No abnormal masses noted. What he is pointing to is normal cartilage. no thyroid nodule.  Reassurance. He does have some low-grade nausea since starting Trulicity. He will try leaving this off 1 week to see if nausea symptoms resolve.  [name] MD [facility] at [facility]

## 2016-03-02 NOTE — Patient Instructions (Signed)
HOLD Trulicity for one week to see if nausea resolves If nausea resolves, try the Trulicity once more in one week Let me know if nausea not resolving.

## 2016-03-10 ENCOUNTER — Other Ambulatory Visit: Payer: Self-pay | Admitting: Family Medicine

## 2016-03-25 ENCOUNTER — Encounter: Payer: Self-pay | Admitting: Family Medicine

## 2016-03-25 ENCOUNTER — Ambulatory Visit (INDEPENDENT_AMBULATORY_CARE_PROVIDER_SITE_OTHER): Payer: Medicare Other | Admitting: Family Medicine

## 2016-03-25 VITALS — BP 120/80 | HR 73 | Temp 98.0°F | Ht 72.0 in | Wt 223.0 lb

## 2016-03-25 DIAGNOSIS — Z23 Encounter for immunization: Secondary | ICD-10-CM

## 2016-03-25 DIAGNOSIS — Z0000 Encounter for general adult medical examination without abnormal findings: Secondary | ICD-10-CM

## 2016-03-25 DIAGNOSIS — D582 Other hemoglobinopathies: Secondary | ICD-10-CM

## 2016-03-25 LAB — COMPREHENSIVE METABOLIC PANEL WITH GFR
ALT: 8 U/L (ref 0–53)
AST: 9 U/L (ref 0–37)
Albumin: 4.1 g/dL (ref 3.5–5.2)
Alkaline Phosphatase: 43 U/L (ref 39–117)
BUN: 19 mg/dL (ref 6–23)
CO2: 28 meq/L (ref 19–32)
Calcium: 9.3 mg/dL (ref 8.4–10.5)
Chloride: 106 meq/L (ref 96–112)
Creatinine, Ser: 1.37 mg/dL (ref 0.40–1.50)
GFR: 54.89 mL/min — ABNORMAL LOW
Glucose, Bld: 168 mg/dL — ABNORMAL HIGH (ref 70–99)
Potassium: 4.6 meq/L (ref 3.5–5.1)
Sodium: 141 meq/L (ref 135–145)
Total Bilirubin: 0.4 mg/dL (ref 0.2–1.2)
Total Protein: 6.7 g/dL (ref 6.0–8.3)

## 2016-03-25 LAB — CBC WITH DIFFERENTIAL/PLATELET
Basophils Absolute: 0 10*3/uL (ref 0.0–0.1)
Basophils Relative: 0.4 % (ref 0.0–3.0)
EOS PCT: 3.9 % (ref 0.0–5.0)
Eosinophils Absolute: 0.3 10*3/uL (ref 0.0–0.7)
HEMATOCRIT: 35.4 % — AB (ref 39.0–52.0)
HEMOGLOBIN: 11.9 g/dL — AB (ref 13.0–17.0)
LYMPHS PCT: 15.9 % (ref 12.0–46.0)
Lymphs Abs: 1.1 10*3/uL (ref 0.7–4.0)
MCHC: 33.8 g/dL (ref 30.0–36.0)
MCV: 81.1 fl (ref 78.0–100.0)
MONOS PCT: 7 % (ref 3.0–12.0)
Monocytes Absolute: 0.5 10*3/uL (ref 0.1–1.0)
Neutro Abs: 4.9 10*3/uL (ref 1.4–7.7)
Neutrophils Relative %: 72.8 % (ref 43.0–77.0)
Platelets: 184 10*3/uL (ref 150.0–400.0)
RBC: 4.36 Mil/uL (ref 4.22–5.81)
RDW: 14.7 % (ref 11.5–15.5)
WBC: 6.7 10*3/uL (ref 4.0–10.5)

## 2016-03-25 LAB — HM DIABETES EYE EXAM

## 2016-03-25 LAB — PSA: PSA: 1.13 ng/mL (ref 0.10–4.00)

## 2016-03-25 LAB — HM DIABETES FOOT EXAM: HM DIABETIC FOOT EXAM: NORMAL

## 2016-03-25 NOTE — Progress Notes (Signed)
Subjective:     Patient ID: Lucas Warren, male   DOB: 1946-03-03, 70 y.o.   MRN: [id number]  HPI Patient seen for physical exam. Chronic problems include history of hypertension, GERD, type 2 diabetes, psoriasis, hyperlipidemia. Recent poorly controlled diabetes. We added trulicity but he had nausea even after 3 weeks. He recently discontinued. We switched him to Crestor for hyperlipidemia but he felt was elevating blood sugars and after switching himself back to simvastatin a week ago blood sugars much improved. Recent fasting blood sugars around 130 or less.  Immunizations reviewed. No history of shingles vaccine-he thinks he did not have chickenpox as a child. Needs tetanus booster. He had abrasion left lower lateral leg few days ago  Colonoscopy up-to-date.  Past Medical History:  Diagnosis Date  . Allergy    seasonal  . Anxiety   . Chest pain, atypical   . Colon polyps    hyperplastic 2003  . DJD (degenerative joint disease)   . DM (diabetes mellitus) ([place])   . GERD (gastroesophageal reflux disease)   . History of headache   . Hypercholesteremia   . Psoriasis    Past Surgical History:  Procedure Laterality Date  . INGUINAL HERNIA REPAIR  1989  . NASAL SEPTUM SURGERY  1989    reports that he quit smoking about 34 years ago. His smoking use included Cigarettes. He has a 40.00 pack-year smoking history. He has never used smokeless tobacco. He reports that he drinks alcohol. He reports that he does not use drugs. family history includes Alzheimer's disease in his father; Heart disease in his father; Leukemia in his sister; Lung cancer in his mother. Allergies  Allergen Reactions  . Erythromycin     REACTION: abd pain  . Trulicity [Dulaglutide] Nausea Only     Review of Systems  Constitutional: Negative for activity change, appetite change, fatigue and fever.  HENT: Negative for congestion, ear pain and trouble swallowing.   Eyes: Negative for pain and visual  disturbance.  Respiratory: Negative for cough, shortness of breath and wheezing.   Cardiovascular: Negative for chest pain and palpitations.  Gastrointestinal: Negative for abdominal distention, abdominal pain, blood in stool, constipation, diarrhea, nausea, rectal pain and vomiting.  Genitourinary: Negative for dysuria, hematuria and testicular pain.  Musculoskeletal: Negative for arthralgias and joint swelling.  Skin: Negative for rash.  Neurological: Negative for dizziness, syncope and headaches.  Hematological: Negative for adenopathy.  Psychiatric/Behavioral: Negative for confusion and dysphoric mood.       Objective:   Physical Exam  Constitutional: He is oriented to person, place, and time. He appears well-developed and well-nourished. No distress.  HENT:  Head: Normocephalic and atraumatic.  Right Ear: External ear normal.  Left Ear: External ear normal.  Mouth/Throat: Oropharynx is clear and moist.  Eyes: Conjunctivae and EOM are normal. Pupils are equal, round, and reactive to light.  Neck: Normal range of motion. Neck supple. No thyromegaly present.  Cardiovascular: Normal rate, regular rhythm and normal heart sounds.   No murmur heard. Pulmonary/Chest: No respiratory distress. He has no wheezes. He has no rales.  Abdominal: Soft. Bowel sounds are normal. He exhibits no distension and no mass. There is no tenderness. There is no rebound and no guarding.  Musculoskeletal: He exhibits no edema.  Lymphadenopathy:    He has no cervical adenopathy.  Neurological: He is alert and oriented to person, place, and time. He displays normal reflexes. No cranial nerve deficit.  Skin: No rash noted.  Patient has superficial abrasion  left lateral leg. No signs of secondary infection  Psychiatric: He has a normal mood and affect.       Assessment:     Complete physical. Recommend tetanus booster. Patient also needs clarification regarding prior chickenpox status so we can advise on  Zostavax. Colonoscopy up-to-date.    Plan:     -Tetanus booster given -Obtain further labs including CBC, PSA, Varicella IgG, comprehensive Metabolic panel, hepatitis C antibody -We'll plan to recheck A1c at follow-up in 3 months -Consider shingles vaccine if Varicella IgG positive -The natural history of prostate cancer and ongoing controversy regarding screening and potential treatment outcomes of prostate cancer has been discussed with the patient. The meaning of a false positive PSA and a false negative PSA has been discussed. He indicates understanding of the limitations of this screening test and wishes to proceed with screening PSA testing.   [name] MD [facility] at [facility]

## 2016-03-25 NOTE — Progress Notes (Signed)
Pre visit review using our clinic review tool, if applicable. No additional management support is needed unless otherwise documented below in the visit note. 

## 2016-03-26 LAB — VARICELLA ZOSTER ANTIBODY, IGG: Varicella IgG: 2154 Index — ABNORMAL HIGH (ref <135.00)

## 2016-03-26 LAB — HEPATITIS C ANTIBODY: HCV Ab: NEGATIVE

## 2016-03-26 NOTE — Addendum Note (Signed)
Addended by: Agnes Lawrence on: 03/26/2016 02:10 PM   Modules accepted: Orders

## 2016-03-31 ENCOUNTER — Ambulatory Visit (INDEPENDENT_AMBULATORY_CARE_PROVIDER_SITE_OTHER): Payer: Medicare Other | Admitting: Family Medicine

## 2016-03-31 ENCOUNTER — Other Ambulatory Visit (INDEPENDENT_AMBULATORY_CARE_PROVIDER_SITE_OTHER): Payer: Medicare Other

## 2016-03-31 DIAGNOSIS — Z23 Encounter for immunization: Secondary | ICD-10-CM | POA: Diagnosis not present

## 2016-03-31 DIAGNOSIS — D582 Other hemoglobinopathies: Secondary | ICD-10-CM

## 2016-03-31 LAB — FERRITIN: Ferritin: 70.1 ng/mL (ref 22.0–322.0)

## 2016-03-31 LAB — IRON: Iron: 78 ug/dL (ref 42–165)

## 2016-04-01 LAB — IRON AND TIBC
%SAT: 25 % (ref 15–60)
Iron: 84 ug/dL (ref 50–180)
TIBC: 342 ug/dL (ref 250–425)
UIBC: 258 ug/dL (ref 125–400)

## 2016-04-22 ENCOUNTER — Other Ambulatory Visit: Payer: Self-pay | Admitting: *Deleted

## 2016-04-22 DIAGNOSIS — L821 Other seborrheic keratosis: Secondary | ICD-10-CM | POA: Diagnosis not present

## 2016-04-22 DIAGNOSIS — D1801 Hemangioma of skin and subcutaneous tissue: Secondary | ICD-10-CM | POA: Diagnosis not present

## 2016-04-22 DIAGNOSIS — D2372 Other benign neoplasm of skin of left lower limb, including hip: Secondary | ICD-10-CM | POA: Diagnosis not present

## 2016-04-22 DIAGNOSIS — L738 Other specified follicular disorders: Secondary | ICD-10-CM | POA: Diagnosis not present

## 2016-04-22 DIAGNOSIS — D692 Other nonthrombocytopenic purpura: Secondary | ICD-10-CM | POA: Diagnosis not present

## 2016-04-22 MED ORDER — SAXAGLIPTIN HCL 5 MG PO TABS: 5.0000 mg | ORAL_TABLET | Freq: Every day | ORAL | 2 refills | Status: DC

## 2016-04-27 ENCOUNTER — Encounter: Payer: Self-pay | Admitting: Family Medicine

## 2016-04-27 ENCOUNTER — Other Ambulatory Visit (INDEPENDENT_AMBULATORY_CARE_PROVIDER_SITE_OTHER): Payer: Medicare Other

## 2016-04-27 DIAGNOSIS — K921 Melena: Secondary | ICD-10-CM | POA: Diagnosis not present

## 2016-04-27 LAB — POC HEMOCCULT BLD/STL (HOME/3-CARD/SCREEN)
Card #2 Fecal Occult Blod, POC: NEGATIVE
Card #3 Fecal Occult Blood, POC: NEGATIVE
FECAL OCCULT BLD: NEGATIVE

## 2016-04-30 ENCOUNTER — Other Ambulatory Visit: Payer: Self-pay | Admitting: Family Medicine

## 2016-05-02 NOTE — Telephone Encounter (Signed)
Refill for one year 

## 2016-05-19 ENCOUNTER — Other Ambulatory Visit: Payer: Self-pay | Admitting: Family Medicine

## 2016-05-22 ENCOUNTER — Other Ambulatory Visit: Payer: Self-pay | Admitting: Family Medicine

## 2016-06-10 ENCOUNTER — Other Ambulatory Visit: Payer: Self-pay | Admitting: Family Medicine

## 2016-06-26 ENCOUNTER — Ambulatory Visit: Payer: Medicare Other | Admitting: Family Medicine

## 2016-06-29 ENCOUNTER — Encounter: Payer: Self-pay | Admitting: Family Medicine

## 2016-06-29 ENCOUNTER — Ambulatory Visit (INDEPENDENT_AMBULATORY_CARE_PROVIDER_SITE_OTHER): Payer: Medicare Other | Admitting: Family Medicine

## 2016-06-29 VITALS — BP 140/80 | HR 65 | Temp 97.7°F | Ht 72.0 in | Wt 226.0 lb

## 2016-06-29 DIAGNOSIS — Z23 Encounter for immunization: Secondary | ICD-10-CM

## 2016-06-29 DIAGNOSIS — D649 Anemia, unspecified: Secondary | ICD-10-CM | POA: Diagnosis not present

## 2016-06-29 DIAGNOSIS — E1165 Type 2 diabetes mellitus with hyperglycemia: Secondary | ICD-10-CM | POA: Diagnosis not present

## 2016-06-29 DIAGNOSIS — I10 Essential (primary) hypertension: Secondary | ICD-10-CM

## 2016-06-29 LAB — POCT GLYCOSYLATED HEMOGLOBIN (HGB A1C): Hemoglobin A1C: 7.5

## 2016-06-29 MED ORDER — GLUCOSE BLOOD VI STRP: ORAL_STRIP | 2 refills | Status: DC

## 2016-06-29 NOTE — Progress Notes (Signed)
Pre visit review using our clinic review tool, if applicable. No additional management support is needed unless otherwise documented below in the visit note. 

## 2016-06-29 NOTE — Progress Notes (Signed)
Subjective:     Patient ID: EYOAB LADUCA, male   DOB: August 30, 1946, 70 y.o.   MRN: [id number]  HPI Patient seen for follow-up regarding multiple items  Type 2 diabetes. History of recent suboptimal control with A1c 8.3%. Intolerance to GLP-1 medication with trulicity. He remains on Actos, metformin, Amaryl, and Onglyza. He has been reluctant to consider insulin. His weight is up 3 pounds from last visit but he states his blood sugars have been improved with fasting blood sugars ranging 110-130.  Recent normocytic anemia with hemoglobin 11.9. Hemoccults negative. No evidence for iron deficiency. No dizziness.  Hypertension treated with lisinopril. No orthostatic dizziness. No chest pains. Sometimes has occasional sensation of very transient palpitations and flutter when taking deep breath but never exertional. No syncope.  Past Medical History:  Diagnosis Date  . Allergy    seasonal  . Anxiety   . Chest pain, atypical   . Colon polyps    hyperplastic 2003  . DJD (degenerative joint disease)   . DM (diabetes mellitus) ([place])   . GERD (gastroesophageal reflux disease)   . History of headache   . Hypercholesteremia   . Psoriasis    Past Surgical History:  Procedure Laterality Date  . INGUINAL HERNIA REPAIR  1989  . NASAL SEPTUM SURGERY  1989    reports that he quit smoking about 34 years ago. His smoking use included Cigarettes. He has a 40.00 pack-year smoking history. He has never used smokeless tobacco. He reports that he drinks alcohol. He reports that he does not use drugs. family history includes Alzheimer's disease in his father; Heart disease in his father; Leukemia in his sister; Lung cancer in his mother. Allergies  Allergen Reactions  . Erythromycin     REACTION: abd pain  . Trulicity [Dulaglutide] Nausea Only     Review of Systems  Constitutional: Negative for appetite change, fatigue and unexpected weight change.  Eyes: Negative for visual disturbance.   Respiratory: Negative for cough, chest tightness and shortness of breath.   Cardiovascular: Positive for palpitations. Negative for chest pain and leg swelling.  Genitourinary: Negative for dysuria.  Neurological: Negative for dizziness, syncope, weakness, light-headedness and headaches.       Objective:   Physical Exam  Constitutional: He is oriented to person, place, and time. He appears well-developed and well-nourished.  HENT:  Right Ear: External ear normal.  Left Ear: External ear normal.  Mouth/Throat: Oropharynx is clear and moist.  Eyes: Pupils are equal, round, and reactive to light.  Neck: Neck supple. No thyromegaly present.  Cardiovascular: Normal rate and regular rhythm.   Pulmonary/Chest: Effort normal and breath sounds normal. No respiratory distress. He has no wheezes. He has no rales.  Musculoskeletal: He exhibits no edema.  Neurological: He is alert and oriented to person, place, and time.       Assessment:     #1 type 2 diabetes. History of recent suboptimal control  #2 hypertension-marginal control  #3 mild normocytic anemia. Iron studies were normal and Hemoccults negative    Plan:     -Flu vaccine given -Check hemoglobin A1c-improved at 7.5%. -Is encouraged to lose some weight. -Consider SGLT-2 class of diabetic drug if blood sugar still elevated. Would consider Jardiance given recent positive reduction in cardiovascular disease risk  [name] MD [facility] at [facility]

## 2016-08-19 ENCOUNTER — Telehealth: Payer: Self-pay | Admitting: Family Medicine

## 2016-08-19 ENCOUNTER — Encounter: Payer: Self-pay | Admitting: Internal Medicine

## 2016-08-19 ENCOUNTER — Ambulatory Visit (INDEPENDENT_AMBULATORY_CARE_PROVIDER_SITE_OTHER)
Admission: RE | Admit: 2016-08-19 | Discharge: 2016-08-19 | Disposition: A | Discharge status: Home / Self Care | Payer: Medicare Other | Source: Ambulatory Visit | Attending: Internal Medicine | Admitting: Internal Medicine

## 2016-08-19 ENCOUNTER — Ambulatory Visit (INDEPENDENT_AMBULATORY_CARE_PROVIDER_SITE_OTHER): Payer: Medicare Other | Admitting: Internal Medicine

## 2016-08-19 ENCOUNTER — Other Ambulatory Visit (INDEPENDENT_AMBULATORY_CARE_PROVIDER_SITE_OTHER): Payer: Medicare Other

## 2016-08-19 VITALS — BP 144/70 | HR 69 | Temp 97.9°F | Resp 16 | Ht 72.0 in | Wt 231.0 lb

## 2016-08-19 DIAGNOSIS — R042 Hemoptysis: Secondary | ICD-10-CM

## 2016-08-19 DIAGNOSIS — R059 Cough, unspecified: Secondary | ICD-10-CM | POA: Insufficient documentation

## 2016-08-19 DIAGNOSIS — R05 Cough: Secondary | ICD-10-CM

## 2016-08-19 LAB — CBC WITH DIFFERENTIAL/PLATELET
BASOS PCT: 0.2 % (ref 0.0–3.0)
Basophils Absolute: 0 10*3/uL (ref 0.0–0.1)
Eosinophils Absolute: 0.3 10*3/uL (ref 0.0–0.7)
Eosinophils Relative: 3 % (ref 0.0–5.0)
HCT: 37.4 % — ABNORMAL LOW (ref 39.0–52.0)
Hemoglobin: 12.6 g/dL — ABNORMAL LOW (ref 13.0–17.0)
LYMPHS ABS: 1.4 10*3/uL (ref 0.7–4.0)
Lymphocytes Relative: 15 % (ref 12.0–46.0)
MCHC: 33.8 g/dL (ref 30.0–36.0)
MCV: 81.8 fl (ref 78.0–100.0)
MONO ABS: 0.7 10*3/uL (ref 0.1–1.0)
Monocytes Relative: 7.2 % (ref 3.0–12.0)
NEUTROS ABS: 6.8 10*3/uL (ref 1.4–7.7)
NEUTROS PCT: 74.6 % (ref 43.0–77.0)
PLATELETS: 198 10*3/uL (ref 150.0–400.0)
RBC: 4.57 Mil/uL (ref 4.22–5.81)
RDW: 15 % (ref 11.5–15.5)
WBC: 9.2 10*3/uL (ref 4.0–10.5)

## 2016-08-19 LAB — PROTIME-INR
INR: 1.1 ratio — AB (ref 0.8–1.0)
Prothrombin Time: 12 s (ref 9.6–13.1)

## 2016-08-19 LAB — APTT: aPTT: 31.2 s (ref 23.4–32.7)

## 2016-08-19 MED ORDER — HYDROCODONE-HOMATROPINE 5-1.5 MG/5ML PO SYRP: 5.0000 mL | ORAL_SOLUTION | Freq: Three times a day (TID) | ORAL | 0 refills | Status: DC | PRN

## 2016-08-19 NOTE — Progress Notes (Signed)
Subjective:  Patient ID: Lucas Warren, male    DOB: 1946-03-03  Age: 70 y.o. MRN: [id number]  CC: Cough   HPI MITCHEAL RABBITT presents for concerns about a chronic, nonproductive cough for several months but today he became concerned when he coughed up about a tablespoon full of bright red blood. The cough has otherwise been nonproductive. He denies chest pain, shortness of breath, night sweats, palpitations, dizziness, lightheadedness, edema, fatigue, or near-syncope.  Outpatient Medications Prior to Visit  Medication Sig Dispense Refill  . aspirin 81 MG tablet 1 tablet once per week    . azelastine (ASTELIN) 0.1 % nasal spray Place 1 spray into both nostrils 2 (two) times daily. Use in each nostril as directed 30 mL 12  . glimepiride (AMARYL) 4 MG tablet TAKE ONE TABLET BY MOUTH ONCE DAILY WITH BREAKFAST 90 tablet 1  . glucose blood test strip Check blood sugars once per day. DX: E11.65 100 each 2  . metFORMIN (GLUCOPHAGE) 500 MG tablet TAKE TWO TABLETS BY MOUTH TWICE DAILY 180 tablet 2  . omeprazole (PRILOSEC) 20 MG capsule Take 20 mg by mouth daily as needed. 30 mintues before dinner    . pioglitazone (ACTOS) 45 MG tablet TAKE ONE TABLET BY MOUTH ONCE DAILY 90 tablet 1  . saxagliptin HCl (ONGLYZA) 5 MG TABS tablet Take 1 tablet (5 mg total) by mouth daily. 90 tablet 2  . sildenafil (REVATIO) 20 MG tablet Take 2-5 tablets as directed 50 tablet 5  . simvastatin (ZOCOR) 40 MG tablet TAKE ONE TABLET BY MOUTH AT BEDTIME (Patient taking differently: TAKE ONE TABLET BY MOUTH AT SUPPER) 90 tablet 3  . vitamin B-12 (CYANOCOBALAMIN) 500 MCG tablet Take 500 mcg by mouth daily.    . celecoxib (CELEBREX) 200 MG capsule Take 1 capsule (200 mg total) by mouth daily. 30 capsule 6  . lisinopril (PRINIVIL,ZESTRIL) 10 MG tablet TAKE ONE TABLET BY MOUTH ONCE DAILY 90 tablet 1   No facility-administered medications prior to visit.     ROS Review of Systems  Constitutional: Negative for activity  change, appetite change, chills, diaphoresis, fatigue and unexpected weight change.  HENT: Negative for nosebleeds, sinus pressure and trouble swallowing.   Eyes: Negative for visual disturbance.  Respiratory: Positive for cough. Negative for chest tightness, shortness of breath, wheezing and stridor.   Cardiovascular: Negative for chest pain, palpitations and leg swelling.  Gastrointestinal: Negative for abdominal pain, blood in stool, diarrhea, nausea and vomiting.  Endocrine: Negative.   Genitourinary: Negative.  Negative for difficulty urinating, frequency, hematuria and urgency.  Musculoskeletal: Negative for back pain and myalgias.  Skin: Negative.  Negative for color change, pallor and rash.  Allergic/Immunologic: Negative.   Neurological: Negative.  Negative for syncope and light-headedness.  Hematological: Negative for adenopathy. Does not bruise/bleed easily.  Psychiatric/Behavioral: Negative.     Objective:  BP (!) 144/70 (BP Location: Left Arm, Patient Position: Sitting, Cuff Size: Normal)   Pulse 69   Temp 97.9 F (36.6 C) (Oral)   Resp 16   Ht 6' (1.829 m)   Wt 231 lb (104.8 kg)   SpO2 96%   BMI 31.33 kg/m   BP Readings from Last 3 Encounters:  08/19/16 (!) 144/70  06/29/16 140/80  03/25/16 120/80    Wt Readings from Last 3 Encounters:  08/19/16 231 lb (104.8 kg)  06/29/16 226 lb (102.5 kg)  03/25/16 223 lb (101.2 kg)    Physical Exam  Constitutional: He is oriented to person, place, and  time. No distress.  HENT:  Nose: No rhinorrhea. No epistaxis.  Mouth/Throat: Oropharynx is clear and moist. No oropharyngeal exudate.  Eyes: Conjunctivae are normal. Right eye exhibits no discharge. Left eye exhibits no discharge. No scleral icterus.  Neck: Normal range of motion. Neck supple. No JVD present. No tracheal deviation present. No thyromegaly present.  Cardiovascular: Normal rate, regular rhythm, normal heart sounds and intact distal pulses.  Exam reveals no  gallop and no friction rub.   No murmur heard. Pulmonary/Chest: Effort normal and breath sounds normal. No stridor. No respiratory distress. He has no wheezes. He has no rales. He exhibits no tenderness.  Abdominal: Soft. Bowel sounds are normal. He exhibits no distension and no mass. There is no tenderness. There is no rebound and no guarding.  Musculoskeletal: Normal range of motion. He exhibits no edema, tenderness or deformity.  Lymphadenopathy:    He has no cervical adenopathy.  Neurological: He is oriented to person, place, and time.  Skin: Skin is warm and dry. No rash noted. He is not diaphoretic. No erythema. No pallor.  Vitals reviewed.   Lab Results  Component Value Date   WBC 9.2 08/19/2016   HGB 12.6 (L) 08/19/2016   HCT 37.4 (L) 08/19/2016   PLT 198.0 08/19/2016   GLUCOSE 168 (H) 03/25/2016   CHOL 118 12/25/2015   TRIG 73.0 12/25/2015   HDL 47.70 12/25/2015   LDLCALC 55 12/25/2015   ALT 8 03/25/2016   AST 9 03/25/2016   NA 141 03/25/2016   K 4.6 03/25/2016   CL 106 03/25/2016   CREATININE 1.37 03/25/2016   BUN 19 03/25/2016   CO2 28 03/25/2016   TSH 0.86 07/24/2015   PSA 1.13 03/25/2016   INR 1.1 (H) 08/19/2016   HGBA1C 7.5 06/29/2016   MICROALBUR 2.1 (H) 02/02/2014    Dg Chest 2 View  Result Date: 12/05/2012 *RADIOLOGY REPORT* Clinical Data: Atypical chest pain.  Former smoker with hypertension. CHEST - 2 VIEW Comparison: 10/05/2011 Findings: Two views of the chest demonstrate clear lungs.  Stable appearance of the heart and mediastinum.  Trachea is midline.  No evidence for airspace disease or pulmonary edema.  No evidence for pleural effusions.  Bony thorax is intact. Stable densities or calcifications in the region of the right clavicle and anterior right first rib. IMPRESSION: No active cardiopulmonary disease. Original Report Authenticated By: Markus Daft, M.D.    Assessment & Plan:   Griff was seen today for cough.  Diagnoses and all orders for this  visit:  Cough- his chronic cough is most likely related to the ACE inhibitor so I have asked him to stop taking lisinopril. His chest x-ray is normal indicating no evidence of cancer or pneumonia. Will control the cough with Hycodan. Return in about 3 weeks for recheck and to monitor his blood pressure. -     DG Chest 2 View; Future -     HYDROcodone-homatropine (HYCODAN) 5-1.5 MG/5ML syrup; Take 5 mLs by mouth every 8 (eight) hours as needed for cough.  Hemoptysis- He's had a chronic nonproductive cough and then coughed up a small amount of bright red blood and is not coughed up any blood or blood since then, his exam and chest x-ray are normal today, his CBC and coags are negative for any evidence of coagulopathy. He does not take any anticoagulants. This is consistent with a brief bleed due to chronic cough/is viral bronchitis. Will control the cough with Hycodan. -     CBC with Differential/Platelet;  Future -     Protime-INR; Future -     APTT; Future -     DG Chest 2 View; Future   I have discontinued Mr. Wegleitner celecoxib and lisinopril. I am also having him start on HYDROcodone-homatropine. Additionally, I am having him maintain his aspirin, omeprazole, sildenafil, azelastine, vitamin B-12, saxagliptin HCl, simvastatin, pioglitazone, glimepiride, metFORMIN, and glucose blood.  Meds ordered this encounter  Medications  . HYDROcodone-homatropine (HYCODAN) 5-1.5 MG/5ML syrup    Sig: Take 5 mLs by mouth every 8 (eight) hours as needed for cough.    Dispense:  120 mL    Refill:  0     Follow-up: Return in about 3 weeks (around 09/09/2016).  Scarlette Calico, MD

## 2016-08-19 NOTE — Telephone Encounter (Signed)
Patient Name: Lucas Warren DOB: [birth year] Initial Comment Caller says, coughed up a lot of blood this morning, never happened before. There was no blood from his nasal mucus. Nurse Assessment Nurse: Vallery Sa, RN, Cathy Date/Time (Eastern Time): 08/19/2016 8:41:05 AM Confirm and document reason for call. If symptomatic, describe symptoms. ---Nathaneil Canary states he developed a cough several months ago and he coughed up almost a tablespoon of blood so far today. No severe breathing difficulty. No chest pain. No fever. Alert and responsive. No injury in the past 3 days. Does the patient have any new or worsening symptoms? ---Yes Will a triage be completed? ---Yes Related visit to physician within the last 2 weeks? ---No Does the PT have any chronic conditions? (i.e. diabetes, asthma, etc.) ---Yes List chronic conditions. ---Diabetes Is this a behavioral health or substance abuse call? ---No Guidelines Guideline Title Affirmed Question Affirmed Notes Coughing Up Blood [1] Nasal discharge AND [2] present > 10 days Final Disposition User See Physician within 24 Hours [place], RN, Tye [place] Comments Scheduled 1pm appointment today with Scarlette Calico at [place] office. Referrals REFERRED TO PCP OFFICE Disagree/Comply: Comply

## 2016-08-19 NOTE — Patient Instructions (Signed)
Cough, Adult Coughing is a reflex that clears your throat and your airways. Coughing helps to heal and protect your lungs. It is normal to cough occasionally, but a cough that happens with other symptoms or lasts a long time may be a sign of a condition that needs treatment. A cough may last only 2-3 weeks (acute), or it may last longer than 8 weeks (chronic). What are the causes? Coughing is commonly caused by:  Breathing in substances that irritate your lungs.  A viral or bacterial respiratory infection.  Allergies.  Asthma.  Postnasal drip.  Smoking.  Acid backing up from the stomach into the esophagus (gastroesophageal reflux).  Certain medicines.  Chronic lung problems, including COPD (or rarely, lung cancer).  Other medical conditions such as heart failure.  Follow these instructions at home: Pay attention to any changes in your symptoms. Take these actions to help with your discomfort:  Take medicines only as told by your health care provider. ? If you were prescribed an antibiotic medicine, take it as told by your health care provider. Do not stop taking the antibiotic even if you start to feel better. ? Talk with your health care provider before you take a cough suppressant medicine.  Drink enough fluid to keep your urine clear or pale yellow.  If the air is dry, use a cold steam vaporizer or humidifier in your bedroom or your home to help loosen secretions.  Avoid anything that causes you to cough at work or at home.  If your cough is worse at night, try sleeping in a semi-upright position.  Avoid cigarette smoke. If you smoke, quit smoking. If you need help quitting, ask your health care provider.  Avoid caffeine.  Avoid alcohol.  Rest as needed.  Contact a health care provider if:  You have new symptoms.  You cough up pus.  Your cough does not get better after 2-3 weeks, or your cough gets worse.  You cannot control your cough with suppressant  medicines and you are losing sleep.  You develop pain that is getting worse or pain that is not controlled with pain medicines.  You have a fever.  You have unexplained weight loss.  You have night sweats. Get help right away if:  You cough up blood.  You have difficulty breathing.  Your heartbeat is very fast. This information is not intended to replace advice given to you by your health care provider. Make sure you discuss any questions you have with your health care provider. Document Released: 02/13/2011 Document Revised: 01/23/2016 Document Reviewed: 10/24/2014 Elsevier Interactive Patient Education  2017 [employer]  

## 2016-08-19 NOTE — Telephone Encounter (Signed)
Noted  

## 2016-08-19 NOTE — Progress Notes (Signed)
Pre visit review using our clinic review tool, if applicable. No additional management support is needed unless otherwise documented below in the visit note. 

## 2016-09-24 DIAGNOSIS — G8929 Other chronic pain: Secondary | ICD-10-CM | POA: Insufficient documentation

## 2016-09-24 DIAGNOSIS — M25511 Pain in right shoulder: Secondary | ICD-10-CM | POA: Insufficient documentation

## 2016-10-07 ENCOUNTER — Ambulatory Visit (INDEPENDENT_AMBULATORY_CARE_PROVIDER_SITE_OTHER): Payer: Medicare Other | Admitting: Family Medicine

## 2016-10-07 ENCOUNTER — Encounter: Payer: Self-pay | Admitting: Family Medicine

## 2016-10-07 VITALS — BP 122/78 | HR 62 | Ht 72.0 in | Wt 222.4 lb

## 2016-10-07 DIAGNOSIS — E1165 Type 2 diabetes mellitus with hyperglycemia: Secondary | ICD-10-CM

## 2016-10-07 DIAGNOSIS — I10 Essential (primary) hypertension: Secondary | ICD-10-CM

## 2016-10-07 LAB — POCT GLYCOSYLATED HEMOGLOBIN (HGB A1C): Hemoglobin A1C: 7.8

## 2016-10-07 NOTE — Progress Notes (Signed)
Pre visit review using our clinic review tool, if applicable. No additional management support is needed unless otherwise documented below in the visit note. 

## 2016-10-07 NOTE — Progress Notes (Signed)
Subjective:     Patient ID: Lucas Warren, male   DOB: 01-14-1946, 71 y.o.   MRN: [id number]  HPI Patient here for follow-up regarding his diabetes. We saw him last October with A1c 7.5% which was improved compared with 8.3 previously. He is on multidrug regimen. He has not been exercising much lately but his weight is down 4 pounds. Fasting blood sugars usually around 130 range.  Patient had cough with one episode of hemoptysis back prior to Christmas. He was seen by another physician and obtained chest x-ray which was unremarkable. He was placed on Hycodan cough syrup and lisinopril discontinued. His cough resolved after a week or two and he's had no episodes whatsoever of hemoptysis since then. He has not had any recurrent cough since starting back lisinopril. No dyspnea. No chest pains. No recent hypoglycemia.  Past Medical History:  Diagnosis Date  . Allergy    seasonal  . Anxiety   . Chest pain, atypical   . Colon polyps    hyperplastic 2003  . DJD (degenerative joint disease)   . DM (diabetes mellitus) ([place])   . GERD (gastroesophageal reflux disease)   . History of headache   . Hypercholesteremia   . Psoriasis    Past Surgical History:  Procedure Laterality Date  . INGUINAL HERNIA REPAIR  1989  . NASAL SEPTUM SURGERY  1989    reports that he quit smoking about 35 years ago. His smoking use included Cigarettes. He has a 40.00 pack-year smoking history. He has never used smokeless tobacco. He reports that he drinks alcohol. He reports that he does not use drugs. family history includes Alzheimer's disease in his father; Heart disease in his father; Leukemia in his sister; Lung cancer in his mother. Allergies  Allergen Reactions  . Lisinopril Cough  . Erythromycin     REACTION: abd pain  . Trulicity [Dulaglutide] Nausea Only     Review of Systems  Constitutional: Negative for fatigue.  Eyes: Negative for visual disturbance.  Respiratory: Negative for cough, chest  tightness and shortness of breath.   Cardiovascular: Negative for chest pain, palpitations and leg swelling.  Endocrine: Negative for polydipsia and polyuria.  Neurological: Negative for dizziness, syncope, weakness, light-headedness and headaches.       Objective:   Physical Exam  Constitutional: He is oriented to person, place, and time. He appears well-developed and well-nourished.  HENT:  Right Ear: External ear normal.  Left Ear: External ear normal.  Mouth/Throat: Oropharynx is clear and moist.  Eyes: Pupils are equal, round, and reactive to light.  Neck: Neck supple. No thyromegaly present.  Cardiovascular: Normal rate and regular rhythm.   Pulmonary/Chest: Effort normal and breath sounds normal. No respiratory distress. He has no wheezes. He has no rales.  Musculoskeletal: He exhibits no edema.  Neurological: He is alert and oriented to person, place, and time.       Assessment:     #1 type 2 diabetes. History of suboptimal control but improving last visit  #2 recent cough resolved  #3 hypertension- stable back on Lisinopril.    Plan:     -Recheck hemoglobin A1c=7.8% -He declines additional medications at this time. -Establish more consistent exercise -follow up in 3 months.  [name] MD [facility] at [facility]

## 2016-10-07 NOTE — Patient Instructions (Signed)
Step up exercise Lose some more weight Let's plan 3 month follow up.

## 2016-10-10 ENCOUNTER — Other Ambulatory Visit: Payer: Self-pay | Admitting: Family Medicine

## 2016-10-15 DIAGNOSIS — M25511 Pain in right shoulder: Secondary | ICD-10-CM | POA: Diagnosis not present

## 2016-10-15 DIAGNOSIS — G8929 Other chronic pain: Secondary | ICD-10-CM | POA: Diagnosis not present

## 2016-10-28 ENCOUNTER — Other Ambulatory Visit: Payer: Self-pay | Admitting: Family Medicine

## 2016-11-17 DIAGNOSIS — G8929 Other chronic pain: Secondary | ICD-10-CM | POA: Diagnosis not present

## 2016-11-17 DIAGNOSIS — M25511 Pain in right shoulder: Secondary | ICD-10-CM | POA: Diagnosis not present

## 2016-11-18 ENCOUNTER — Other Ambulatory Visit: Payer: Self-pay | Admitting: Family Medicine

## 2016-11-21 ENCOUNTER — Other Ambulatory Visit: Payer: Self-pay | Admitting: Family Medicine

## 2016-11-21 DIAGNOSIS — M25511 Pain in right shoulder: Secondary | ICD-10-CM | POA: Diagnosis not present

## 2016-11-24 DIAGNOSIS — G8929 Other chronic pain: Secondary | ICD-10-CM | POA: Diagnosis not present

## 2016-11-24 DIAGNOSIS — M25511 Pain in right shoulder: Secondary | ICD-10-CM | POA: Diagnosis not present

## 2016-11-30 DIAGNOSIS — S46011A Strain of muscle(s) and tendon(s) of the rotator cuff of right shoulder, initial encounter: Secondary | ICD-10-CM | POA: Diagnosis not present

## 2016-12-02 ENCOUNTER — Telehealth: Payer: Self-pay | Admitting: Family Medicine

## 2016-12-02 NOTE — Telephone Encounter (Signed)
Lucas Warren pt is calling to check to see if Dr. Elease Hashimoto has rec'd a clearance form from Lucas Warren and Lucas Warren so that he can have his surgery set up.

## 2016-12-03 NOTE — Telephone Encounter (Signed)
Pt is calling to check the status of the form and is very upset that [facility] has not received it back b/c they stated they faxed it here on Monday when he left the office and pt said that it is holding up his surgery for his torn rotator cuff

## 2016-12-03 NOTE — Telephone Encounter (Signed)
Per Misty, Dr Elease Hashimoto does have the form and [place] informed the pt of this.

## 2016-12-04 NOTE — Telephone Encounter (Signed)
Pt following up on the request for form completion.  Pt states this is holding his surgery up and needs this sent asap.

## 2016-12-07 NOTE — Telephone Encounter (Signed)
Paperwork is ready to be faxed and the patient is aware

## 2016-12-18 DIAGNOSIS — S46011A Strain of muscle(s) and tendon(s) of the rotator cuff of right shoulder, initial encounter: Secondary | ICD-10-CM | POA: Diagnosis not present

## 2016-12-18 DIAGNOSIS — S43491A Other sprain of right shoulder joint, initial encounter: Secondary | ICD-10-CM | POA: Diagnosis not present

## 2016-12-18 DIAGNOSIS — M7541 Impingement syndrome of right shoulder: Secondary | ICD-10-CM | POA: Diagnosis not present

## 2016-12-18 DIAGNOSIS — M24111 Other articular cartilage disorders, right shoulder: Secondary | ICD-10-CM | POA: Diagnosis not present

## 2016-12-18 DIAGNOSIS — G8918 Other acute postprocedural pain: Secondary | ICD-10-CM | POA: Diagnosis not present

## 2016-12-18 DIAGNOSIS — M7501 Adhesive capsulitis of right shoulder: Secondary | ICD-10-CM | POA: Diagnosis not present

## 2016-12-21 DIAGNOSIS — M25611 Stiffness of right shoulder, not elsewhere classified: Secondary | ICD-10-CM | POA: Diagnosis not present

## 2016-12-21 DIAGNOSIS — M25511 Pain in right shoulder: Secondary | ICD-10-CM | POA: Diagnosis not present

## 2016-12-21 DIAGNOSIS — R531 Weakness: Secondary | ICD-10-CM | POA: Diagnosis not present

## 2016-12-22 DIAGNOSIS — R531 Weakness: Secondary | ICD-10-CM | POA: Diagnosis not present

## 2016-12-22 DIAGNOSIS — M25511 Pain in right shoulder: Secondary | ICD-10-CM | POA: Diagnosis not present

## 2016-12-22 DIAGNOSIS — M25611 Stiffness of right shoulder, not elsewhere classified: Secondary | ICD-10-CM | POA: Diagnosis not present

## 2016-12-24 DIAGNOSIS — M25511 Pain in right shoulder: Secondary | ICD-10-CM | POA: Diagnosis not present

## 2016-12-24 DIAGNOSIS — R531 Weakness: Secondary | ICD-10-CM | POA: Diagnosis not present

## 2016-12-24 DIAGNOSIS — M25611 Stiffness of right shoulder, not elsewhere classified: Secondary | ICD-10-CM | POA: Diagnosis not present

## 2016-12-29 DIAGNOSIS — R531 Weakness: Secondary | ICD-10-CM | POA: Diagnosis not present

## 2016-12-29 DIAGNOSIS — M25611 Stiffness of right shoulder, not elsewhere classified: Secondary | ICD-10-CM | POA: Diagnosis not present

## 2016-12-29 DIAGNOSIS — M25511 Pain in right shoulder: Secondary | ICD-10-CM | POA: Diagnosis not present

## 2016-12-31 DIAGNOSIS — M25511 Pain in right shoulder: Secondary | ICD-10-CM | POA: Diagnosis not present

## 2016-12-31 DIAGNOSIS — M25611 Stiffness of right shoulder, not elsewhere classified: Secondary | ICD-10-CM | POA: Diagnosis not present

## 2016-12-31 DIAGNOSIS — R531 Weakness: Secondary | ICD-10-CM | POA: Diagnosis not present

## 2017-01-05 DIAGNOSIS — R531 Weakness: Secondary | ICD-10-CM | POA: Diagnosis not present

## 2017-01-05 DIAGNOSIS — M25511 Pain in right shoulder: Secondary | ICD-10-CM | POA: Diagnosis not present

## 2017-01-05 DIAGNOSIS — M25611 Stiffness of right shoulder, not elsewhere classified: Secondary | ICD-10-CM | POA: Diagnosis not present

## 2017-01-05 NOTE — Progress Notes (Signed)
Subjective:   Lucas Warren is a 71 y.o. male who presents for an Initial Medicare Annual Wellness Visit following his apt with Dr. Elease Hashimoto at 7:45   HRA assessment completed during this visit with Mr. Karger  The Patient was informed that the wellness visit is to identify future health risk and educate and initiate measures that can reduce risk for increased disease through the lifespan.    NO ROS; Medicare Wellness Visit Last OV scheduled today  Labs completed: 03/2016 Chol 217; trig 149; HDL 41 and LDL 146 11/2015  Given education on chol  A1c 8.4 and this am it was 9.1    Describes health as fair, good or great? Good x for recent rotator cuff injury  Risk DM; HTN; hypercholesterolemia; chest pain; obesity  Family Hx lung cancer; Alz; HD and Leukemia in his sister Nuclear stress low risk   Update:  Tobacco: quit 1983; smoked 2 packs per day for a 40 pack year hx Stress test and it was normal  Will refer for AAA - agreed to this plan  2nd Hand Smoke no Chew or electronic cigarettes no ETOH: - yes; rare beer  Medications bs just starting to drop back after steroid injection 120 to 130 range   BMI: 30 range  Diet;   Breakfast- breakfast bowl; or bowl of grits; red eye gravy  Lunch small meal sandwich Supper; meal at dinner; wife cooks Vegetables or he goes out  May try eating a vegetable plate a couple of days as week Likes to grill   Exercise;  walking in the driveway 2 loops around the driveway or [id number] gets 30 minutes of walking everyday  Plays golf 1 time a week in a senior's group   Safety features reviewed for safe community;  One level home  firearms if in the home; will keep in a safe place if they exist  smoke alarms; yes sun protection when outside; just a reminder; nose and ears and skin checks driving difficulties or accidents; no   Advanced Directive; Reviewed advanced directive and agreed to receipt of information and discussion as he cannot  locate the one he completed. Focused face to face x  20 minutes discussing HCPOA and Living will and reviewed all the questions in the [place] forms. The patient voices understanding of HCPOA; LW reviewed and information provided on each question. Educated on how to revoke this HCPOA or LW at any time.   Also  discussed life prolonging measures (given a few examples) and where he could choose to initiate or not;  the ability to given the HCPOA power to change his living will or not if he cannot speak for himself; as well as finalizing the will by 2 unrelated witnesses and notary.  Will call for questions and given information on [facility] pastoral department for further assistance.    HOME SAFETY;  Fall hx; no falls  Fall risk: Fear of falling? no Gait: normal  Given education on "Fall Prevention in the Home" for more safety tips the patient can apply as appropriate.  Long term goal is to "age in place"    Mental Health:  Any emotional problems? Anxious, depressed, irritable, sad or blue? no Denies feeling depressed or hopeless; voices pleasure in daily life How many social activities have you been engaged in within the last 2 weeks? no   Pain: subsiding with rotator cuff; was extremely painful post op  Almost 3 weeks; started PT week 2  Cognitive;  Manages checkbook, medications; no failures of task Ad8 score reviewed for issues;  Issues making decisions; no  Less interest in hobbies / activities" no  Repeats questions, stories; family complaining: NO  Trouble using ordinary gadgets; microwave; computer: no  Forgets the month or year: no  Mismanaging finances: no  Missing apt: no but does write them down  Daily problems with thinking of memory NO Ad8 score is 0  Loves woodworking and found this really lowers his stress   Any dizziness when standing up? No BP issues    Health Maintenance Colonoscopy; due 05/2022  Prostate cancer screening: 1.13  Just had zoster  03/31/2016  Educated on shingrix but will hold for now   Cardiac Risk Factors include: advanced age (>72men, >56 women);diabetes mellitus;dyslipidemia;family history of premature cardiovascular disease;male gender    Objective:    Today's Vitals   01/06/17 0742  BP: 102/70  Pulse: 64  Temp: 98.4 F (36.9 C)  TempSrc: Oral  Weight: 212 lb 8 oz (96.4 kg)  Height: 6' (1.829 m)   Body mass index is 28.82 kg/m.  Current Medications (verified) Outpatient Encounter Prescriptions as of 01/06/2017  Medication Sig  . aspirin 81 MG tablet 1 tablet once per week  . azelastine (ASTELIN) 0.1 % nasal spray Place 1 spray into both nostrils 2 (two) times daily. Use in each nostril as directed  . CELECOXIB PO Using for 30 days.  Marland Kitchen glimepiride (AMARYL) 4 MG tablet TAKE ONE TABLET BY MOUTH ONCE DAILY WITH BREAKFAST.  Marland Kitchen glucose blood test strip Check blood sugars once per day. DX: E11.65 one touch mini fine  . lisinopril (PRINIVIL,ZESTRIL) 10 MG tablet TAKE ONE TABLET BY MOUTH ONCE DAILY  . metFORMIN (GLUCOPHAGE) 500 MG tablet TAKE TWO TABLETS BY MOUTH TWICE DAILY  . omeprazole (PRILOSEC) 20 MG capsule Take 20 mg by mouth daily as needed. 30 mintues before dinner  . pioglitazone (ACTOS) 45 MG tablet TAKE ONE TABLET BY MOUTH ONCE DAILY  . saxagliptin HCl (ONGLYZA) 5 MG TABS tablet Take 1 tablet (5 mg total) by mouth daily.  . sildenafil (REVATIO) 20 MG tablet Take 2-5 tablets as directed  . simvastatin (ZOCOR) 40 MG tablet TAKE ONE TABLET BY MOUTH AT BEDTIME (Patient taking differently: TAKE ONE TABLET BY MOUTH AT SUPPER)  . vitamin B-12 (CYANOCOBALAMIN) 500 MCG tablet Take 500 mcg by mouth daily.  . [DISCONTINUED] glucose blood test strip Check blood sugars once per day. DX: E11.65 (Patient taking differently: Check blood sugars once per day. DX: E11.65 one touch mini fine)  . [DISCONTINUED] HYDROcodone-homatropine (HYCODAN) 5-1.5 MG/5ML syrup Take 5 mLs by mouth every 8 (eight) hours as needed for  cough.   No facility-administered encounter medications on file as of 01/06/2017.     Allergies (verified) Lisinopril; Erythromycin; and Trulicity [dulaglutide]   History: Past Medical History:  Diagnosis Date  . Allergy    seasonal  . Anxiety   . Chest pain, atypical   . Colon polyps    hyperplastic 2003  . DJD (degenerative joint disease)   . DM (diabetes mellitus) ([place])   . GERD (gastroesophageal reflux disease)   . History of headache   . Hypercholesteremia   . Psoriasis    Past Surgical History:  Procedure Laterality Date  . INGUINAL HERNIA REPAIR  1989  . NASAL SEPTUM SURGERY  1989   Family History  Problem Relation Age of Onset  . Lung cancer Mother   . Alzheimer's disease Father   . Heart  disease Father   . Leukemia Sister    Social History   Occupational History  .      Patent examiner   Social History Main Topics  . Smoking status: Former Smoker    Packs/day: 2.00    Years: 20.00    Types: Cigarettes    Quit date: 08/31/1981  . Smokeless tobacco: Never Used  . Alcohol use Yes     Comment: rarely a beer  . Drug use: No  . Sexual activity: Not on file   Tobacco Counseling Counseling given: Not Answered   Activities of Daily Living In your present state of health, do you have any difficulty performing the following activities: 01/06/2017  Hearing? N  Vision? N  Difficulty concentrating or making decisions? N  Walking or climbing stairs? N  Dressing or bathing? N  Doing errands, shopping? N  Preparing Food and eating ? N  Using the Toilet? N  In the past six months, have you accidently leaked urine? N  Do you have problems with loss of bowel control? N  Managing your Medications? N  Managing your Finances? N  Housekeeping or managing your Housekeeping? N  Some recent data might be hidden    Immunizations and Health Maintenance Immunization History  Administered Date(s) Administered  . Influenza Split 10/05/2011  . Influenza Whole  07/16/2009  . Influenza, High Dose Seasonal PF 05/22/2015, 06/29/2016  . Influenza,inj,Quad PF,36+ Mos 07/07/2013, 05/04/2014  . Pneumococcal Conjugate-13 08/21/2014  . Pneumococcal Polysaccharide-23 12/05/2012  . Pneumococcal-Unspecified 10/11/2007  . Td 03/09/2006  . Tdap 03/25/2016  . Zoster 03/31/2016   There are no preventive care reminders to display for this patient.  Patient Care Team: [name], MD as PCP - General (Family Medicine)  Indicate any recent Medical Services you may have received from other than [facility] providers in the past year (date may be approximate).    Assessment:   This is a routine wellness examination for Randall.   Hearing/Vision screen Hearing Screening Comments: Hearing isuses; none  Vision Screening Comments: Vision screens one q year Dr. Newman Nip  He sends report   Dietary issues and exercise activities discussed: Current Exercise Habits: Home exercise routine, Type of exercise: walking, Time (Minutes): 60, Intensity: Moderate, Exercise limited by: orthopedic condition(s)  Goals    . Exercise 150 minutes per week (moderate activity)          Wants to go back to the gym Wants to do more wood work  will visit with [place] regarding Diabetes      Depression Screen PHQ 2/9 Scores 01/06/2017 01/06/2017 12/25/2015 08/21/2014  PHQ - 2 Score 0 0 0 0    Fall Risk Fall Risk  01/06/2017 01/06/2017 12/25/2015 08/21/2014 05/04/2014  Falls in the past year? No No No No No    Cognitive Function: MMSE - Mini Mental State Exam 01/06/2017  Not completed: (No Data)    WNL; no issues with daily functioning     Screening Tests Health Maintenance  Topic Date Due  . FOOT EXAM  03/25/2017  . OPHTHALMOLOGY EXAM  03/25/2017  . INFLUENZA VACCINE  03/31/2017  . HEMOGLOBIN A1C  04/06/2017  . PNA vac Low Risk Adult (2 of 2 - PPSV23) 12/05/2017  . COLONOSCOPY  06/10/2022  . TETANUS/TDAP  03/25/2026  . Hepatitis C Screening  Completed        Plan:       PCP Notes  Health Maintenance HM up to date; educated given on shingrix  but just had zoster last year; will hold for now  Will completed another AD and bring into Dr. Elease Hashimoto  Will order AAA due to long term smoking hx (Had high stress job)    Abnormal Screens A1c trending up ;  BMI 28 has high chol issues in diet; referred to [place] but agreed to go to Tori Milks, a Engineer, maintenance at [employer] to discuss diet and "clean" tighten up some   [facility]   Patient concerns; just recovering from rotator cuff and playing golf   Nurse Concerns; more exercise which he agrees too. [place] Jackelyn Poling will assist him in being more active   Next PCP apt was today   I have personally reviewed and noted the following in the patient's chart:   . Medical and social history . Use of alcohol, tobacco or illicit drugs  . Current medications and supplements . Functional ability and status . Nutritional status . Physical activity . Advanced directives . List of other physicians . Hospitalizations, surgeries, and ER visits in previous 12 months . Vitals . Screenings to include cognitive, depression, and falls . Referrals and appointments  In addition, I have reviewed and discussed with patient certain preventive protocols, quality metrics, and best practice recommendations. A written personalized care plan for preventive services as well as general preventive health recommendations were provided to patient.     UKGUR,KYHCW, RN   01/06/2017   Agree with assessment above.  Agree with AAA screen.  He will get close follow up in 3 months and change diabetes regimen if not better by then.  [name] MD [facility]

## 2017-01-06 ENCOUNTER — Encounter: Payer: Self-pay | Admitting: Family Medicine

## 2017-01-06 ENCOUNTER — Ambulatory Visit (INDEPENDENT_AMBULATORY_CARE_PROVIDER_SITE_OTHER): Payer: Medicare Other | Admitting: Family Medicine

## 2017-01-06 VITALS — BP 102/70 | HR 64 | Temp 98.4°F | Ht 72.0 in | Wt 212.5 lb

## 2017-01-06 DIAGNOSIS — E1165 Type 2 diabetes mellitus with hyperglycemia: Secondary | ICD-10-CM | POA: Diagnosis not present

## 2017-01-06 DIAGNOSIS — Z87891 Personal history of nicotine dependence: Secondary | ICD-10-CM | POA: Diagnosis not present

## 2017-01-06 DIAGNOSIS — I10 Essential (primary) hypertension: Secondary | ICD-10-CM

## 2017-01-06 DIAGNOSIS — Z0000 Encounter for general adult medical examination without abnormal findings: Secondary | ICD-10-CM

## 2017-01-06 DIAGNOSIS — E7800 Pure hypercholesterolemia, unspecified: Secondary | ICD-10-CM | POA: Diagnosis not present

## 2017-01-06 LAB — LIPID PANEL
CHOL/HDL RATIO: 3
CHOLESTEROL: 143 mg/dL (ref 0–200)
HDL: 45.6 mg/dL (ref >39.00)
LDL Cholesterol: 72 mg/dL (ref 0–99)
NONHDL: 97.1
Triglycerides: 128 mg/dL (ref 0.0–149.0)
VLDL: 25.6 mg/dL (ref 0.0–40.0)

## 2017-01-06 LAB — BASIC METABOLIC PANEL
BUN: 27 mg/dL — ABNORMAL HIGH (ref 6–23)
CALCIUM: 9.9 mg/dL (ref 8.4–10.5)
CHLORIDE: 103 meq/L (ref 96–112)
CO2: 26 meq/L (ref 19–32)
Creatinine, Ser: 1.21 mg/dL (ref 0.40–1.50)
GFR: 63.2 mL/min (ref >60.00)
Glucose, Bld: 212 mg/dL — ABNORMAL HIGH (ref 70–99)
Potassium: 4.7 mEq/L (ref 3.5–5.1)
SODIUM: 137 meq/L (ref 135–145)

## 2017-01-06 LAB — HEPATIC FUNCTION PANEL
ALK PHOS: 65 U/L (ref 39–117)
ALT: 10 U/L (ref 0–53)
AST: 8 U/L (ref 0–37)
Albumin: 4.4 g/dL (ref 3.5–5.2)
BILIRUBIN DIRECT: 0.1 mg/dL (ref 0.0–0.3)
BILIRUBIN TOTAL: 0.4 mg/dL (ref 0.2–1.2)
Total Protein: 7 g/dL (ref 6.0–8.3)

## 2017-01-06 LAB — POCT GLYCOSYLATED HEMOGLOBIN (HGB A1C): Hemoglobin A1C: 9.1

## 2017-01-06 MED ORDER — GLUCOSE BLOOD VI STRP: ORAL_STRIP | 2 refills | Status: DC

## 2017-01-06 NOTE — Progress Notes (Signed)
Subjective:     Patient ID: Lucas Warren, male   DOB: Jul 17, 1946, 71 y.o.   MRN: [id number]  HPI Patient seen for medical follow-up and Medicare subsequent annual wellness visit today. His chronic problems include hypertension, type 2 diabetes, dyslipidemia, GERD. Recent right rotator cuff surgery April 20 and is recovering from that. He is actively engaged in physical therapy. His pain is slowly improving. Has been less active because of his recent surgery. He's had some recent poor compliance with diet. Fasting blood sugars of vary considerably from this morning around 116 to occasionally over 200. His current diabetic medications include metformin, Amaryl, Actos, and Onglyza. Previous intolerance with GLP-1 class  He remains on simvastatin for hyperlipidemia. No myalgias. He does complain of some generalized "muscle weakness ". Blood pressure stable on lisinopril. No recent dizziness. No chest pains.  Past Medical History:  Diagnosis Date  . Allergy    seasonal  . Anxiety   . Chest pain, atypical   . Colon polyps    hyperplastic 2003  . DJD (degenerative joint disease)   . DM (diabetes mellitus) ([place])   . GERD (gastroesophageal reflux disease)   . History of headache   . Hypercholesteremia   . Psoriasis    Past Surgical History:  Procedure Laterality Date  . INGUINAL HERNIA REPAIR  1989  . NASAL SEPTUM SURGERY  1989    reports that he quit smoking about 35 years ago. His smoking use included Cigarettes. He has a 40.00 pack-year smoking history. He has never used smokeless tobacco. He reports that he drinks alcohol. He reports that he does not use drugs. family history includes Alzheimer's disease in his father; Heart disease in his father; Leukemia in his sister; Lung cancer in his mother. Allergies  Allergen Reactions  . Lisinopril Cough  . Erythromycin     REACTION: abd pain  . Trulicity [Dulaglutide] Nausea Only     Review of Systems  Constitutional: Negative for  activity change, appetite change, fatigue and fever.  HENT: Negative for congestion.   Eyes: Negative for visual disturbance.  Respiratory: Negative for cough, shortness of breath and wheezing.   Cardiovascular: Negative for chest pain and palpitations.  Genitourinary: Negative for dysuria.  Musculoskeletal: Negative for arthralgias.  Neurological: Negative for dizziness and headaches.  Hematological: Negative for adenopathy.       Objective:   Physical Exam  Constitutional: He is oriented to person, place, and time. He appears well-developed and well-nourished.  HENT:  Right Ear: External ear normal.  Left Ear: External ear normal.  Mouth/Throat: Oropharynx is clear and moist.  Eyes: Pupils are equal, round, and reactive to light.  Neck: Neck supple. No thyromegaly present.  Cardiovascular: Normal rate and regular rhythm.   Pulmonary/Chest: Effort normal and breath sounds normal. No respiratory distress. He has no wheezes. He has no rales.  Musculoskeletal: He exhibits no edema.  Neurological: He is alert and oriented to person, place, and time.       Assessment:     #1 hypertension stable and at goal  #2 type 2 diabetes with poor control. A1c today 9.1%. He states he is compliant with medications but has been poorly compliant with diet at times and less active with recent surgery  #3 dyslipidemia    Plan:     -we have recommended consideration for making some medication changes but at this point he is very reluctant. He wishes to try 3 months of increased dietary compliance and increase walking and reassess  A1c. If not improving that point consider trial of Jardiance. -Obtain further labs today with lipid panel, hepatic panel, basic metabolic panel -Reassess in 3 months  [name] MD [facility] at [facility]

## 2017-01-06 NOTE — Patient Instructions (Addendum)
Increase exercise with walking. Scale back sugars and starches Let's plan 3 month follow up and if A1C not improving by then we will make some medication changes.   Lucas Warren , Thank you for taking time to come for your Medicare Wellness Visit. I appreciate your ongoing commitment to your health goals. Please review the following plan we discussed and let me know if I can assist you in the future.   Will refer or AAA check and someone will outreach to make an apt with your.  Mentioned the [facility] but has been to [employer] at the [facility] and recommend you touch base with her again.   Shingrix is a vaccine for the prevention of Shingles in Adults 50 and older.  If you are on Medicare, you can request a prescription from your doctor to be filled at a pharmacy.  Please check with your benefits regarding applicable copays or out of pocket expenses.  The Shingrix is given in 2 vaccines approx 8 weeks apart. You must receive the 2nd dose prior to 6 months from receipt of the first.   Will try to complete AD; Given copy  Referred to [facility] for questions [place] offers free advance directive forms, as well as assistance in completing the forms themselves. For assistance, contact the Spiritual Care Department at [phone], or the Clinical Social Work Department at [phone].  Prevention of falls: Remove rugs or any tripping hazards in the home Use Non slip mats in bathtubs and showers Placing grab bars next to the toilet and or shower Placing handrails on both sides of the stair way Adding extra lighting in the home.   Personal safety issues reviewed:  1. Consider starting a community watch program per [facility] 2.  Changes batteries is smoke detector and/or carbon monoxide detector  3.  If you have firearms; keep them in a safe place 4.  Wear protection when in the sun; Always wear sunscreen or a hat; It is good to have your doctor check your skin  annually or review any new areas of concern 5. Driving safety; Keep in the right lane; stay 3 car lengths behind the car in front of you on the highway; look 3 times prior to pulling out; carry your cell phone everywhere you go!    Learn about the Yellow Dot program:  The program allows first responders at your emergency to have access to who your physician is, as well as your medications and medical conditions.  Citizens requesting the Yellow Dot Packages should contact Master Corporal Nunzio Cobbs at the [facility] [phone] for the first week of the program and beginning the week after Easter citizens should contact their Scientist, physiological.    These are the goals we discussed: Goals    None      This is a list of the screening recommended for you and due dates:  Health Maintenance  Topic Date Due  . Complete foot exam   03/25/2017  . Eye exam for diabetics  03/25/2017  . Flu Shot  03/31/2017  . Hemoglobin A1C  04/06/2017  . Pneumonia vaccines (2 of 2 - PPSV23) 12/05/2017  . Colon Cancer Screening  06/10/2022  . Tetanus Vaccine  03/25/2026  .  Hepatitis C: One time screening is recommended by Center for Disease Control  (CDC) for  adults born from [birth year] through 1963.   Completed     Cholesterol Cholesterol is a white, waxy, fat-like  substance that is needed by the human body in small amounts. The liver makes all the cholesterol we need. Cholesterol is carried from the liver by the blood through the blood vessels. Deposits of cholesterol (plaques) may build up on blood vessel (artery) walls. Plaques make the arteries narrower and stiffer. Cholesterol plaques increase the risk for heart attack and stroke. You cannot feel your cholesterol level even if it is very high. The only way to know that it is high is to have a blood test. Once you know your cholesterol levels, you should keep a record of the test results. Work with your health care provider  to keep your levels in the desired range. What do the results mean?  Total cholesterol is a rough measure of all the cholesterol in your blood.  LDL (low-density lipoprotein) is the "bad" cholesterol. This is the type that causes plaque to build up on the artery walls. You want this level to be low.  HDL (high-density lipoprotein) is the "good" cholesterol because it cleans the arteries and carries the LDL away. You want this level to be high.  Triglycerides are fat that the body can either burn for energy or store. High levels are closely linked to heart disease. What are the desired levels of cholesterol?  Total cholesterol below 200.  LDL below 100 for people who are at risk, below 70 for people at very high risk.  HDL above 40 is good. A level of 60 or higher is considered to be protective against heart disease.  Triglycerides below 150. How can I lower my cholesterol? Diet  Follow your diet program as told by your health care provider.  Choose fish or white meat chicken and [place], roasted or baked. Limit fatty cuts of red meat, fried foods, and processed meats, such as sausage and lunch meats.  Eat lots of fresh fruits and vegetables.  Choose whole grains, beans, pasta, potatoes, and cereals.  Choose olive oil, corn oil, or canola oil, and use only small amounts.  Avoid butter, mayonnaise, shortening, or palm kernel oils.  Avoid foods with trans fats.  Drink skim or nonfat milk and eat low-fat or nonfat yogurt and cheeses. Avoid whole milk, cream, ice cream, egg yolks, and full-fat cheeses.  Healthier desserts include angel food cake, ginger snaps, animal crackers, hard candy, popsicles, and low-fat or nonfat frozen yogurt. Avoid pastries, cakes, pies, and cookies. Exercise  Follow your exercise program as told by your health care provider. A regular program:  Helps to decrease LDL and raise HDL.  Helps with weight control.  Do things that increase your activity  level, such as gardening, walking, and taking the stairs.  Ask your health care provider about ways that you can be more active in your daily life. Medicine  Take over-the-counter and prescription medicines only as told by your health care provider.  Medicine may be prescribed by your health care provider to help lower cholesterol and decrease the risk for heart disease. This is usually done if diet and exercise have failed to bring down cholesterol levels.  If you have several risk factors, you may need medicine even if your levels are normal. This information is not intended to replace advice given to you by your health care provider. Make sure you discuss any questions you have with your health care provider. Document Released: 05/12/2001 Document Revised: 03/14/2016 Document Reviewed: 02/15/2016 Elsevier Interactive Patient Education  2017 [place] Maintenance, Male A healthy lifestyle and preventive  care is important for your health and wellness. Ask your health care provider about what schedule of regular examinations is right for you. What should I know about weight and diet?  Eat a Healthy Diet  Eat plenty of vegetables, fruits, whole grains, low-fat dairy products, and lean protein.  Do not eat a lot of foods high in solid fats, added sugars, or salt. Maintain a Healthy Weight  Regular exercise can help you achieve or maintain a healthy weight. You should:  Do at least 150 minutes of exercise each week. The exercise should increase your heart rate and make you sweat (moderate-intensity exercise).  Do strength-training exercises at least twice a week. Watch Your Levels of Cholesterol and Blood Lipids  Have your blood tested for lipids and cholesterol every 5 years starting at 69 years of age. If you are at high risk for heart disease, you should start having your blood tested when you are 69 years old. You may need to have your cholesterol levels checked more  often if:  Your lipid or cholesterol levels are high.  You are older than 69 years of age.  You are at high risk for heart disease. What should I know about cancer screening? Many types of cancers can be detected early and may often be prevented. Lung Cancer  You should be screened every year for lung cancer if:  You are a current smoker who has smoked for at least 30 years.  You are a former smoker who has quit within the past 15 years.  Talk to your health care provider about your screening options, when you should start screening, and how often you should be screened. Colorectal Cancer  Routine colorectal cancer screening usually begins at 69 years of age and should be repeated every 5-10 years until you are 69 years old. You may need to be screened more often if early forms of precancerous polyps or small growths are found. Your health care provider may recommend screening at an earlier age if you have risk factors for colon cancer.  Your health care provider may recommend using home test kits to check for hidden blood in the stool.  A small camera at the end of a tube can be used to examine your colon (sigmoidoscopy or colonoscopy). This checks for the earliest forms of colorectal cancer. Prostate and Testicular Cancer  Depending on your age and overall health, your health care provider may do certain tests to screen for prostate and testicular cancer.  Talk to your health care provider about any symptoms or concerns you have about testicular or prostate cancer. Skin Cancer  Check your skin from head to toe regularly.  Tell your health care provider about any new moles or changes in moles, especially if:  There is a change in a mole's size, shape, or color.  You have a mole that is larger than a pencil eraser.  Always use sunscreen. Apply sunscreen liberally and repeat throughout the day.  Protect yourself by wearing long sleeves, pants, a wide-brimmed hat, and  sunglasses when outside. What should I know about heart disease, diabetes, and high blood pressure?  If you are 49-40 years of age, have your blood pressure checked every 3-5 years. If you are 81 years of age or older, have your blood pressure checked every year. You should have your blood pressure measured twice-once when you are at a hospital or clinic, and once when you are not at a hospital or clinic. Record the average of  the two measurements. To check your blood pressure when you are not at a hospital or clinic, you can use:  An automated blood pressure machine at a pharmacy.  A home blood pressure monitor.  Talk to your health care provider about your target blood pressure.  If you are between 83-68 years old, ask your health care provider if you should take aspirin to prevent heart disease.  Have regular diabetes screenings by checking your fasting blood sugar level.  If you are at a normal weight and have a low risk for diabetes, have this test once every three years after the age of 84.  If you are overweight and have a high risk for diabetes, consider being tested at a younger age or more often.  A one-time screening for abdominal aortic aneurysm (AAA) by ultrasound is recommended for men aged 6-75 years who are current or former smokers. What should I know about preventing infection? Hepatitis B  If you have a higher risk for hepatitis B, you should be screened for this virus. Talk with your health care provider to find out if you are at risk for hepatitis B infection. Hepatitis C  Blood testing is recommended for:  Everyone born from [birth year] through 1963.  Anyone with known risk factors for hepatitis C. Sexually Transmitted Diseases (STDs)  You should be screened each year for STDs including gonorrhea and chlamydia if:  You are sexually active and are younger than 69 years of age.  You are older than 69 years of age and your health care provider tells you that you are at  risk for this type of infection.  Your sexual activity has changed since you were last screened and you are at an increased risk for chlamydia or gonorrhea. Ask your health care provider if you are at risk.  Talk with your health care provider about whether you are at high risk of being infected with HIV. Your health care provider may recommend a prescription medicine to help prevent HIV infection. What else can I do?  Schedule regular health, dental, and eye exams.  Stay current with your vaccines (immunizations).  Do not use any tobacco products, such as cigarettes, chewing tobacco, and e-cigarettes. If you need help quitting, ask your health care provider.  Limit alcohol intake to no more than 2 drinks per day. One drink equals 12 ounces of beer, 5 ounces of wine, or 1 ounces of hard liquor.  Do not use street drugs.  Do not share needles.  Ask your health care provider for help if you need support or information about quitting drugs.  Tell your health care provider if you often feel depressed.  Tell your health care provider if you have ever been abused or do not feel safe at home. This information is not intended to replace advice given to you by your health care provider. Make sure you discuss any questions you have with your health care provider. Document Released: 02/13/2008 Document Revised: 04/15/2016 Document Reviewed: 05/21/2015 Elsevier Interactive Patient Education  2017 [place] Prevention in the Home Falls can cause injuries and can affect people from all age groups. There are many simple things that you can do to make your home safe and to help prevent falls. What can I do on the outside of my home?  Regularly repair the edges of walkways and driveways and fix any cracks.  Remove high doorway thresholds.  Trim any shrubbery on the main path into  your home.  Use bright outdoor lighting.  Clear walkways of debris and clutter, including tools  and rocks.  Regularly check that handrails are securely fastened and in good repair. Both sides of any steps should have handrails.  Install guardrails along the edges of any raised decks or porches.  Have leaves, snow, and ice cleared regularly.  Use sand or salt on walkways during winter months.  In the garage, clean up any spills right away, including grease or oil spills. What can I do in the bathroom?  Use night lights.  Install grab bars by the toilet and in the tub and shower. Do not use towel bars as grab bars.  Use non-skid mats or decals on the floor of the tub or shower.  If you need to sit down while you are in the shower, use a plastic, non-slip stool.  Keep the floor dry. Immediately clean up any water that spills on the floor.  Remove soap buildup in the tub or shower on a regular basis.  Attach bath mats securely with double-sided non-slip rug tape.  Remove throw rugs and other tripping hazards from the floor. What can I do in the bedroom?  Use night lights.  Make sure that a bedside light is easy to reach.  Do not use oversized bedding that drapes onto the floor.  Have a firm chair that has side arms to use for getting dressed.  Remove throw rugs and other tripping hazards from the floor. What can I do in the kitchen?  Clean up any spills right away.  Avoid walking on wet floors.  Place frequently used items in easy-to-reach places.  If you need to reach for something above you, use a sturdy step stool that has a grab bar.  Keep electrical cables out of the way.  Do not use floor polish or wax that makes floors slippery. If you have to use wax, make sure that it is non-skid floor wax.  Remove throw rugs and other tripping hazards from the floor. What can I do in the stairways?  Do not leave any items on the stairs.  Make sure that there are handrails on both sides of the stairs. Fix handrails that are broken or loose. Make sure that handrails  are as long as the stairways.  Check any carpeting to make sure that it is firmly attached to the stairs. Fix any carpet that is loose or worn.  Avoid having throw rugs at the top or bottom of stairways, or secure the rugs with carpet tape to prevent them from moving.  Make sure that you have a light switch at the top of the stairs and the bottom of the stairs. If you do not have them, have them installed. What are some other fall prevention tips?  Wear closed-toe shoes that fit well and support your feet. Wear shoes that have rubber soles or low heels.  When you use a stepladder, make sure that it is completely opened and that the sides are firmly locked. Have someone hold the ladder while you are using it. Do not climb a closed stepladder.  Add color or contrast paint or tape to grab bars and handrails in your home. Place contrasting color strips on the first and last steps.  Use mobility aids as needed, such as canes, walkers, scooters, and crutches.  Turn on lights if it is dark. Replace any light bulbs that burn out.  Set up furniture so that there are clear paths. Keep  the furniture in the same spot.  Fix any uneven floor surfaces.  Choose a carpet design that does not hide the edge of steps of a stairway.  Be aware of any and all pets.  Review your medicines with your healthcare provider. Some medicines can cause dizziness or changes in blood pressure, which increase your risk of falling. Talk with your health care provider about other ways that you can decrease your risk of falls. This may include working with a physical therapist or trainer to improve your strength, balance, and endurance. This information is not intended to replace advice given to you by your health care provider. Make sure you discuss any questions you have with your health care provider. Document Released: 08/07/2002 Document Revised: 01/14/2016 Document Reviewed: 09/21/2014 Elsevier Interactive Patient  Education  2017 [employer].

## 2017-01-07 DIAGNOSIS — R531 Weakness: Secondary | ICD-10-CM | POA: Diagnosis not present

## 2017-01-07 DIAGNOSIS — M25611 Stiffness of right shoulder, not elsewhere classified: Secondary | ICD-10-CM | POA: Diagnosis not present

## 2017-01-07 DIAGNOSIS — M25511 Pain in right shoulder: Secondary | ICD-10-CM | POA: Diagnosis not present

## 2017-01-08 ENCOUNTER — Telehealth: Payer: Self-pay | Admitting: Family Medicine

## 2017-01-08 MED ORDER — ONETOUCH LANCETS MISC: 3 refills | Status: DC

## 2017-01-08 NOTE — Telephone Encounter (Signed)
Pt needs refill on lancets [facility]

## 2017-01-08 NOTE — Telephone Encounter (Signed)
Pt calling to check to see if you could resend the Rx for his test strips.  Pharm:  [facility]

## 2017-01-08 NOTE — Telephone Encounter (Signed)
Spoke with pharmacists and prescription can not be filled until 01/13/17 because of insurance.

## 2017-01-12 DIAGNOSIS — M25511 Pain in right shoulder: Secondary | ICD-10-CM | POA: Diagnosis not present

## 2017-01-12 DIAGNOSIS — R531 Weakness: Secondary | ICD-10-CM | POA: Diagnosis not present

## 2017-01-12 DIAGNOSIS — M25611 Stiffness of right shoulder, not elsewhere classified: Secondary | ICD-10-CM | POA: Diagnosis not present

## 2017-01-14 DIAGNOSIS — M25611 Stiffness of right shoulder, not elsewhere classified: Secondary | ICD-10-CM | POA: Diagnosis not present

## 2017-01-14 DIAGNOSIS — R531 Weakness: Secondary | ICD-10-CM | POA: Diagnosis not present

## 2017-01-14 DIAGNOSIS — M25511 Pain in right shoulder: Secondary | ICD-10-CM | POA: Diagnosis not present

## 2017-01-19 DIAGNOSIS — R531 Weakness: Secondary | ICD-10-CM | POA: Diagnosis not present

## 2017-01-19 DIAGNOSIS — M25611 Stiffness of right shoulder, not elsewhere classified: Secondary | ICD-10-CM | POA: Diagnosis not present

## 2017-01-19 DIAGNOSIS — M25511 Pain in right shoulder: Secondary | ICD-10-CM | POA: Diagnosis not present

## 2017-01-21 DIAGNOSIS — R531 Weakness: Secondary | ICD-10-CM | POA: Diagnosis not present

## 2017-01-21 DIAGNOSIS — M25511 Pain in right shoulder: Secondary | ICD-10-CM | POA: Diagnosis not present

## 2017-01-21 DIAGNOSIS — M25611 Stiffness of right shoulder, not elsewhere classified: Secondary | ICD-10-CM | POA: Diagnosis not present

## 2017-01-27 DIAGNOSIS — M25511 Pain in right shoulder: Secondary | ICD-10-CM | POA: Diagnosis not present

## 2017-01-27 DIAGNOSIS — R531 Weakness: Secondary | ICD-10-CM | POA: Diagnosis not present

## 2017-01-27 DIAGNOSIS — M25611 Stiffness of right shoulder, not elsewhere classified: Secondary | ICD-10-CM | POA: Diagnosis not present

## 2017-01-29 DIAGNOSIS — R531 Weakness: Secondary | ICD-10-CM | POA: Diagnosis not present

## 2017-01-29 DIAGNOSIS — M25511 Pain in right shoulder: Secondary | ICD-10-CM | POA: Diagnosis not present

## 2017-01-29 DIAGNOSIS — M25611 Stiffness of right shoulder, not elsewhere classified: Secondary | ICD-10-CM | POA: Diagnosis not present

## 2017-02-02 DIAGNOSIS — M25611 Stiffness of right shoulder, not elsewhere classified: Secondary | ICD-10-CM | POA: Diagnosis not present

## 2017-02-02 DIAGNOSIS — R531 Weakness: Secondary | ICD-10-CM | POA: Diagnosis not present

## 2017-02-02 DIAGNOSIS — M25511 Pain in right shoulder: Secondary | ICD-10-CM | POA: Diagnosis not present

## 2017-02-03 ENCOUNTER — Ambulatory Visit (HOSPITAL_COMMUNITY)
Admission: RE | Admit: 2017-02-03 | Discharge: 2017-02-03 | Disposition: A | Discharge status: Home / Self Care | Payer: Medicare Other | Source: Ambulatory Visit | Attending: Cardiology | Admitting: Cardiology

## 2017-02-03 DIAGNOSIS — E119 Type 2 diabetes mellitus without complications: Secondary | ICD-10-CM | POA: Insufficient documentation

## 2017-02-03 DIAGNOSIS — E785 Hyperlipidemia, unspecified: Secondary | ICD-10-CM | POA: Diagnosis not present

## 2017-02-03 DIAGNOSIS — I708 Atherosclerosis of other arteries: Secondary | ICD-10-CM | POA: Diagnosis not present

## 2017-02-03 DIAGNOSIS — Z1389 Encounter for screening for other disorder: Secondary | ICD-10-CM | POA: Insufficient documentation

## 2017-02-03 DIAGNOSIS — I10 Essential (primary) hypertension: Secondary | ICD-10-CM | POA: Insufficient documentation

## 2017-02-03 DIAGNOSIS — I700 Atherosclerosis of aorta: Secondary | ICD-10-CM | POA: Insufficient documentation

## 2017-02-03 DIAGNOSIS — Z87891 Personal history of nicotine dependence: Secondary | ICD-10-CM | POA: Diagnosis not present

## 2017-02-04 DIAGNOSIS — R531 Weakness: Secondary | ICD-10-CM | POA: Diagnosis not present

## 2017-02-04 DIAGNOSIS — M25611 Stiffness of right shoulder, not elsewhere classified: Secondary | ICD-10-CM | POA: Diagnosis not present

## 2017-02-04 DIAGNOSIS — M25511 Pain in right shoulder: Secondary | ICD-10-CM | POA: Diagnosis not present

## 2017-02-09 DIAGNOSIS — M25511 Pain in right shoulder: Secondary | ICD-10-CM | POA: Diagnosis not present

## 2017-02-09 DIAGNOSIS — R531 Weakness: Secondary | ICD-10-CM | POA: Diagnosis not present

## 2017-02-09 DIAGNOSIS — M25611 Stiffness of right shoulder, not elsewhere classified: Secondary | ICD-10-CM | POA: Diagnosis not present

## 2017-02-10 ENCOUNTER — Other Ambulatory Visit: Payer: Self-pay | Admitting: Family Medicine

## 2017-02-11 DIAGNOSIS — M25511 Pain in right shoulder: Secondary | ICD-10-CM | POA: Diagnosis not present

## 2017-02-11 DIAGNOSIS — R531 Weakness: Secondary | ICD-10-CM | POA: Diagnosis not present

## 2017-02-11 DIAGNOSIS — M25611 Stiffness of right shoulder, not elsewhere classified: Secondary | ICD-10-CM | POA: Diagnosis not present

## 2017-02-15 DIAGNOSIS — E119 Type 2 diabetes mellitus without complications: Secondary | ICD-10-CM | POA: Diagnosis not present

## 2017-02-15 LAB — HM DIABETES EYE EXAM

## 2017-02-16 DIAGNOSIS — R531 Weakness: Secondary | ICD-10-CM | POA: Diagnosis not present

## 2017-02-16 DIAGNOSIS — M25611 Stiffness of right shoulder, not elsewhere classified: Secondary | ICD-10-CM | POA: Diagnosis not present

## 2017-02-16 DIAGNOSIS — M25511 Pain in right shoulder: Secondary | ICD-10-CM | POA: Diagnosis not present

## 2017-02-18 DIAGNOSIS — M25611 Stiffness of right shoulder, not elsewhere classified: Secondary | ICD-10-CM | POA: Diagnosis not present

## 2017-02-18 DIAGNOSIS — R531 Weakness: Secondary | ICD-10-CM | POA: Diagnosis not present

## 2017-02-18 DIAGNOSIS — M25511 Pain in right shoulder: Secondary | ICD-10-CM | POA: Diagnosis not present

## 2017-02-23 ENCOUNTER — Encounter: Payer: Self-pay | Admitting: Family Medicine

## 2017-02-23 DIAGNOSIS — M25511 Pain in right shoulder: Secondary | ICD-10-CM | POA: Diagnosis not present

## 2017-02-23 DIAGNOSIS — R531 Weakness: Secondary | ICD-10-CM | POA: Diagnosis not present

## 2017-02-23 DIAGNOSIS — M25611 Stiffness of right shoulder, not elsewhere classified: Secondary | ICD-10-CM | POA: Diagnosis not present

## 2017-02-25 DIAGNOSIS — R531 Weakness: Secondary | ICD-10-CM | POA: Diagnosis not present

## 2017-02-25 DIAGNOSIS — M25611 Stiffness of right shoulder, not elsewhere classified: Secondary | ICD-10-CM | POA: Diagnosis not present

## 2017-02-25 DIAGNOSIS — M25511 Pain in right shoulder: Secondary | ICD-10-CM | POA: Diagnosis not present

## 2017-03-02 DIAGNOSIS — M25611 Stiffness of right shoulder, not elsewhere classified: Secondary | ICD-10-CM | POA: Diagnosis not present

## 2017-03-02 DIAGNOSIS — M25511 Pain in right shoulder: Secondary | ICD-10-CM | POA: Diagnosis not present

## 2017-03-02 DIAGNOSIS — R531 Weakness: Secondary | ICD-10-CM | POA: Diagnosis not present

## 2017-03-04 DIAGNOSIS — M25611 Stiffness of right shoulder, not elsewhere classified: Secondary | ICD-10-CM | POA: Diagnosis not present

## 2017-03-04 DIAGNOSIS — R531 Weakness: Secondary | ICD-10-CM | POA: Diagnosis not present

## 2017-03-04 DIAGNOSIS — M25511 Pain in right shoulder: Secondary | ICD-10-CM | POA: Diagnosis not present

## 2017-03-09 DIAGNOSIS — R531 Weakness: Secondary | ICD-10-CM | POA: Diagnosis not present

## 2017-03-09 DIAGNOSIS — M25511 Pain in right shoulder: Secondary | ICD-10-CM | POA: Diagnosis not present

## 2017-03-09 DIAGNOSIS — M25611 Stiffness of right shoulder, not elsewhere classified: Secondary | ICD-10-CM | POA: Diagnosis not present

## 2017-03-11 DIAGNOSIS — M25511 Pain in right shoulder: Secondary | ICD-10-CM | POA: Diagnosis not present

## 2017-03-11 DIAGNOSIS — M25611 Stiffness of right shoulder, not elsewhere classified: Secondary | ICD-10-CM | POA: Diagnosis not present

## 2017-03-11 DIAGNOSIS — R531 Weakness: Secondary | ICD-10-CM | POA: Diagnosis not present

## 2017-03-12 ENCOUNTER — Other Ambulatory Visit: Payer: Self-pay | Admitting: Family Medicine

## 2017-03-16 DIAGNOSIS — R531 Weakness: Secondary | ICD-10-CM | POA: Diagnosis not present

## 2017-03-16 DIAGNOSIS — M25511 Pain in right shoulder: Secondary | ICD-10-CM | POA: Diagnosis not present

## 2017-03-16 DIAGNOSIS — M25611 Stiffness of right shoulder, not elsewhere classified: Secondary | ICD-10-CM | POA: Diagnosis not present

## 2017-03-18 DIAGNOSIS — M25511 Pain in right shoulder: Secondary | ICD-10-CM | POA: Diagnosis not present

## 2017-03-18 DIAGNOSIS — R531 Weakness: Secondary | ICD-10-CM | POA: Diagnosis not present

## 2017-03-18 DIAGNOSIS — M25611 Stiffness of right shoulder, not elsewhere classified: Secondary | ICD-10-CM | POA: Diagnosis not present

## 2017-03-23 DIAGNOSIS — M25511 Pain in right shoulder: Secondary | ICD-10-CM | POA: Diagnosis not present

## 2017-03-23 DIAGNOSIS — R531 Weakness: Secondary | ICD-10-CM | POA: Diagnosis not present

## 2017-03-23 DIAGNOSIS — M25611 Stiffness of right shoulder, not elsewhere classified: Secondary | ICD-10-CM | POA: Diagnosis not present

## 2017-03-25 DIAGNOSIS — M25511 Pain in right shoulder: Secondary | ICD-10-CM | POA: Diagnosis not present

## 2017-03-25 DIAGNOSIS — R531 Weakness: Secondary | ICD-10-CM | POA: Diagnosis not present

## 2017-03-25 DIAGNOSIS — M25611 Stiffness of right shoulder, not elsewhere classified: Secondary | ICD-10-CM | POA: Diagnosis not present

## 2017-03-30 ENCOUNTER — Telehealth: Payer: Self-pay | Admitting: Family Medicine

## 2017-03-30 DIAGNOSIS — M25511 Pain in right shoulder: Secondary | ICD-10-CM | POA: Diagnosis not present

## 2017-03-30 DIAGNOSIS — R531 Weakness: Secondary | ICD-10-CM | POA: Diagnosis not present

## 2017-03-30 DIAGNOSIS — M25611 Stiffness of right shoulder, not elsewhere classified: Secondary | ICD-10-CM | POA: Diagnosis not present

## 2017-03-30 NOTE — Telephone Encounter (Signed)
Pt need new Rx pioglitazone  Pharm:  [facility].  Pt at pharmacy now can this be called in now.

## 2017-03-30 NOTE — Telephone Encounter (Addendum)
No need for refill , pt had another refill at the pharmacy. Nothing further needed

## 2017-03-31 ENCOUNTER — Other Ambulatory Visit: Payer: Self-pay

## 2017-03-31 MED ORDER — PIOGLITAZONE HCL 45 MG PO TABS: 45.0000 mg | ORAL_TABLET | Freq: Every day | ORAL | 1 refills | Status: DC

## 2017-04-01 DIAGNOSIS — M25511 Pain in right shoulder: Secondary | ICD-10-CM | POA: Diagnosis not present

## 2017-04-01 DIAGNOSIS — M25611 Stiffness of right shoulder, not elsewhere classified: Secondary | ICD-10-CM | POA: Diagnosis not present

## 2017-04-01 DIAGNOSIS — R531 Weakness: Secondary | ICD-10-CM | POA: Diagnosis not present

## 2017-04-06 DIAGNOSIS — M25511 Pain in right shoulder: Secondary | ICD-10-CM | POA: Diagnosis not present

## 2017-04-06 DIAGNOSIS — R531 Weakness: Secondary | ICD-10-CM | POA: Diagnosis not present

## 2017-04-06 DIAGNOSIS — M25611 Stiffness of right shoulder, not elsewhere classified: Secondary | ICD-10-CM | POA: Diagnosis not present

## 2017-04-07 ENCOUNTER — Encounter: Payer: Self-pay | Admitting: Family Medicine

## 2017-04-07 ENCOUNTER — Ambulatory Visit (INDEPENDENT_AMBULATORY_CARE_PROVIDER_SITE_OTHER): Payer: Medicare Other | Admitting: Family Medicine

## 2017-04-07 VITALS — BP 120/80 | HR 70 | Temp 97.8°F | Wt 220.3 lb

## 2017-04-07 DIAGNOSIS — I10 Essential (primary) hypertension: Secondary | ICD-10-CM | POA: Diagnosis not present

## 2017-04-07 DIAGNOSIS — E1165 Type 2 diabetes mellitus with hyperglycemia: Secondary | ICD-10-CM

## 2017-04-07 DIAGNOSIS — E7800 Pure hypercholesterolemia, unspecified: Secondary | ICD-10-CM

## 2017-04-07 LAB — POCT GLYCOSYLATED HEMOGLOBIN (HGB A1C): HEMOGLOBIN A1C: 8.5

## 2017-04-07 MED ORDER — EMPAGLIFLOZIN 10 MG PO TABS: 10.0000 mg | ORAL_TABLET | Freq: Every day | ORAL | 11 refills | Status: DC

## 2017-04-07 NOTE — Progress Notes (Signed)
Subjective:     Patient ID: Lucas Warren, male   DOB: [birth year], 71 y.o.   MRN: [id number]  HPI Follow-up type 2 diabetes. History of poor control. Last A1c 9.1%. He is on multiple medications but poor compliance with diet. He has started walking about a mile per day since last visit. Still eats a lot of desserts and sweets. He is undergoing stress with impending separation from his wife of 27 years and he thinks that may be worsening things.  He currently takes metformin, Actos, Amaryl, and Onglyza. He had previous intolerance with GLP-1 medication (Trulicity) with nausea. We discussed possible addition of Jardiance at this point if A1c not to goal. No hypoglycemia.  He is about to complete 16th week of physical therapy following shoulder surgery. That is gradually improving and he hopes to return to golf soon.  Hypertension which is treated with lisinopril. He is on simvastatin for hyperlipidemia. Lipids were checked last visit and stable and at goal.   Past Medical History:  Diagnosis Date  . Allergy    seasonal  . Anxiety   . Chest pain, atypical   . Colon polyps    hyperplastic 2003  . DJD (degenerative joint disease)   . DM (diabetes mellitus) ([place])   . GERD (gastroesophageal reflux disease)   . History of headache   . Hypercholesteremia   . Psoriasis    Past Surgical History:  Procedure Laterality Date  . INGUINAL HERNIA REPAIR  1989  . NASAL SEPTUM SURGERY  1989    reports that he quit smoking about 35 years ago. His smoking use included Cigarettes. He has a 40.00 pack-year smoking history. He has never used smokeless tobacco. He reports that he drinks alcohol. He reports that he does not use drugs. family history includes Alzheimer's disease in his father; Heart disease in his father; Leukemia in his sister; Lung cancer in his mother. Allergies  Allergen Reactions  . Lisinopril Cough  . Erythromycin     REACTION: abd pain  . Trulicity [Dulaglutide] Nausea Only      Review of Systems  Constitutional: Negative for fatigue and unexpected weight change.  Eyes: Negative for visual disturbance.  Respiratory: Negative for cough, chest tightness and shortness of breath.   Cardiovascular: Negative for chest pain, palpitations and leg swelling.  Endocrine: Negative for polydipsia and polyuria.  Neurological: Negative for dizziness, syncope, weakness, light-headedness and headaches.       Objective:   Physical Exam  Constitutional: He is oriented to person, place, and time. He appears well-developed and well-nourished.  HENT:  Right Ear: External ear normal.  Left Ear: External ear normal.  Mouth/Throat: Oropharynx is clear and moist.  Eyes: Pupils are equal, round, and reactive to light.  Neck: Neck supple. No thyromegaly present.  Cardiovascular: Normal rate and regular rhythm.   Pulmonary/Chest: Effort normal and breath sounds normal. No respiratory distress. He has no wheezes. He has no rales.  Musculoskeletal: He exhibits no edema.  Neurological: He is alert and oriented to person, place, and time.       Assessment:     #1 type 2 diabetes poorly controlled with A1c today 8.5% which is slightly improved but not to goal  #2 hypertension stable and at goal  #3 dyslipidemia    Plan:     -discussed diet and needs to restrict sugars and starches -Add Jardiance 10 mg once daily -Follow-up in 3 months and recheck A1c then -Try to gradually increase his walking duration and  frequency  [name] MD [facility]

## 2017-04-08 DIAGNOSIS — M25611 Stiffness of right shoulder, not elsewhere classified: Secondary | ICD-10-CM | POA: Diagnosis not present

## 2017-04-08 DIAGNOSIS — R531 Weakness: Secondary | ICD-10-CM | POA: Diagnosis not present

## 2017-04-08 DIAGNOSIS — M25511 Pain in right shoulder: Secondary | ICD-10-CM | POA: Diagnosis not present

## 2017-04-13 DIAGNOSIS — M25511 Pain in right shoulder: Secondary | ICD-10-CM | POA: Diagnosis not present

## 2017-04-23 DIAGNOSIS — D2261 Melanocytic nevi of right upper limb, including shoulder: Secondary | ICD-10-CM | POA: Diagnosis not present

## 2017-04-23 DIAGNOSIS — D485 Neoplasm of uncertain behavior of skin: Secondary | ICD-10-CM | POA: Diagnosis not present

## 2017-04-23 DIAGNOSIS — L821 Other seborrheic keratosis: Secondary | ICD-10-CM | POA: Diagnosis not present

## 2017-04-23 DIAGNOSIS — C44212 Basal cell carcinoma of skin of right ear and external auricular canal: Secondary | ICD-10-CM | POA: Diagnosis not present

## 2017-04-23 DIAGNOSIS — L739 Follicular disorder, unspecified: Secondary | ICD-10-CM | POA: Diagnosis not present

## 2017-04-23 DIAGNOSIS — D2262 Melanocytic nevi of left upper limb, including shoulder: Secondary | ICD-10-CM | POA: Diagnosis not present

## 2017-04-23 DIAGNOSIS — D1801 Hemangioma of skin and subcutaneous tissue: Secondary | ICD-10-CM | POA: Diagnosis not present

## 2017-04-23 DIAGNOSIS — D225 Melanocytic nevi of trunk: Secondary | ICD-10-CM | POA: Diagnosis not present

## 2017-04-27 DIAGNOSIS — H2511 Age-related nuclear cataract, right eye: Secondary | ICD-10-CM | POA: Diagnosis not present

## 2017-04-27 DIAGNOSIS — H18413 Arcus senilis, bilateral: Secondary | ICD-10-CM | POA: Diagnosis not present

## 2017-04-27 DIAGNOSIS — H2513 Age-related nuclear cataract, bilateral: Secondary | ICD-10-CM | POA: Diagnosis not present

## 2017-04-27 DIAGNOSIS — H25013 Cortical age-related cataract, bilateral: Secondary | ICD-10-CM | POA: Diagnosis not present

## 2017-04-27 DIAGNOSIS — H25043 Posterior subcapsular polar age-related cataract, bilateral: Secondary | ICD-10-CM | POA: Diagnosis not present

## 2017-05-19 DIAGNOSIS — C44212 Basal cell carcinoma of skin of right ear and external auricular canal: Secondary | ICD-10-CM | POA: Diagnosis not present

## 2017-05-20 ENCOUNTER — Encounter: Payer: Self-pay | Admitting: Family Medicine

## 2017-05-26 ENCOUNTER — Other Ambulatory Visit: Payer: Self-pay | Admitting: Family Medicine

## 2017-06-04 DIAGNOSIS — H2512 Age-related nuclear cataract, left eye: Secondary | ICD-10-CM | POA: Diagnosis not present

## 2017-06-04 DIAGNOSIS — H25811 Combined forms of age-related cataract, right eye: Secondary | ICD-10-CM | POA: Diagnosis not present

## 2017-06-04 DIAGNOSIS — H2511 Age-related nuclear cataract, right eye: Secondary | ICD-10-CM | POA: Diagnosis not present

## 2017-06-15 ENCOUNTER — Telehealth: Payer: Self-pay | Admitting: Family Medicine

## 2017-06-15 ENCOUNTER — Ambulatory Visit (INDEPENDENT_AMBULATORY_CARE_PROVIDER_SITE_OTHER): Payer: Medicare Other | Admitting: *Deleted

## 2017-06-15 DIAGNOSIS — Z23 Encounter for immunization: Secondary | ICD-10-CM | POA: Diagnosis not present

## 2017-06-15 MED ORDER — SILDENAFIL CITRATE 20 MG PO TABS: ORAL_TABLET | ORAL | 3 refills | Status: DC

## 2017-06-15 NOTE — Telephone Encounter (Signed)
Rx sent 

## 2017-06-15 NOTE — Telephone Encounter (Signed)
Refill OK

## 2017-06-15 NOTE — Telephone Encounter (Signed)
Pt request refill  sildenafil (REVATIO) 20 MG tablet  90 tabs  Pt states last filled by Dr Boykin Reaper (only this rx at this pharm)

## 2017-06-15 NOTE — Telephone Encounter (Signed)
Okay to fill? 

## 2017-06-18 DIAGNOSIS — H25812 Combined forms of age-related cataract, left eye: Secondary | ICD-10-CM | POA: Diagnosis not present

## 2017-06-18 DIAGNOSIS — H2512 Age-related nuclear cataract, left eye: Secondary | ICD-10-CM | POA: Diagnosis not present

## 2017-07-12 ENCOUNTER — Ambulatory Visit: Payer: Medicare Other | Admitting: Family Medicine

## 2017-07-13 DIAGNOSIS — Z961 Presence of intraocular lens: Secondary | ICD-10-CM | POA: Diagnosis not present

## 2017-07-20 ENCOUNTER — Ambulatory Visit (INDEPENDENT_AMBULATORY_CARE_PROVIDER_SITE_OTHER): Payer: Medicare Other | Admitting: Family Medicine

## 2017-07-20 ENCOUNTER — Encounter: Payer: Self-pay | Admitting: Family Medicine

## 2017-07-20 VITALS — Temp 98.2°F | Wt 210.3 lb

## 2017-07-20 DIAGNOSIS — E1165 Type 2 diabetes mellitus with hyperglycemia: Secondary | ICD-10-CM

## 2017-07-20 DIAGNOSIS — I10 Essential (primary) hypertension: Secondary | ICD-10-CM

## 2017-07-20 DIAGNOSIS — R059 Cough, unspecified: Secondary | ICD-10-CM

## 2017-07-20 DIAGNOSIS — R05 Cough: Secondary | ICD-10-CM | POA: Diagnosis not present

## 2017-07-20 LAB — POCT GLYCOSYLATED HEMOGLOBIN (HGB A1C): Hemoglobin A1C: 7.9

## 2017-07-20 NOTE — Progress Notes (Signed)
Subjective:     Patient ID: Lucas Warren, male   DOB: 1946-06-29, 71 y.o.   MRN: [id number]  HPI Patient for medical follow-up  Type 2 diabetes. History of poor control. Last A1c 8.5%. We added Jardiance 10 mg daily. No side effects. He's lost 10 pounds. Has tried to make some dietary improvements but still has poor compliance at times. Denies any significant urinary symptoms.  Hypertension which is well-controlled. No dizziness.  No chest pains. No headaches.  He has new problem of cough. Started as a typical cold several days ago. Feels better overall. Cough mostly dry. No fever. No dyspnea.  Past Medical History:  Diagnosis Date  . Allergy    seasonal  . Anxiety   . Chest pain, atypical   . Colon polyps    hyperplastic 2003  . DJD (degenerative joint disease)   . DM (diabetes mellitus) ([place])   . GERD (gastroesophageal reflux disease)   . History of headache   . Hypercholesteremia   . Psoriasis    Past Surgical History:  Procedure Laterality Date  . INGUINAL HERNIA REPAIR  1989  . NASAL SEPTUM SURGERY  1989    reports that he quit smoking about 35 years ago. His smoking use included cigarettes. He has a 40.00 pack-year smoking history. he has never used smokeless tobacco. He reports that he drinks alcohol. He reports that he does not use drugs. family history includes Alzheimer's disease in his father; Heart disease in his father; Leukemia in his sister; Lung cancer in his mother. Allergies  Allergen Reactions  . Lisinopril Cough  . Erythromycin     REACTION: abd pain  . Trulicity [Dulaglutide] Nausea Only     Review of Systems  Constitutional: Negative for fatigue.  Eyes: Negative for visual disturbance.  Respiratory: Positive for cough. Negative for chest tightness, shortness of breath and wheezing.   Cardiovascular: Negative for chest pain, palpitations and leg swelling.  Genitourinary: Negative for dysuria.  Neurological: Negative for dizziness, syncope,  weakness, light-headedness and headaches.       Objective:   Physical Exam  Constitutional: He is oriented to person, place, and time. He appears well-developed and well-nourished.  HENT:  Right Ear: External ear normal.  Left Ear: External ear normal.  Mouth/Throat: Oropharynx is clear and moist.  Eyes: Pupils are equal, round, and reactive to light.  Neck: Neck supple. No thyromegaly present.  Cardiovascular: Normal rate and regular rhythm.  Pulmonary/Chest: Effort normal and breath sounds normal. No respiratory distress. He has no wheezes. He has no rales.  Musculoskeletal: He exhibits no edema.  Neurological: He is alert and oriented to person, place, and time.       Assessment:     #1 type 2 diabetes improved with A1c 7.9%  #2 hypertension stable and at goal  #3 cough suspect secondary to acute viral bronchitis. Non-focal exam    Plan:     -continue current medications -Would like to see his A1c below 7.5%. Tighten up diet and we challenged him to try to lose 10 more pounds over several months. -Touch base if cough not resolving in the next 2-3 weeks  [name] MD [facility] at [facility]

## 2017-07-30 ENCOUNTER — Other Ambulatory Visit: Payer: Self-pay | Admitting: Family Medicine

## 2017-10-11 ENCOUNTER — Ambulatory Visit (INDEPENDENT_AMBULATORY_CARE_PROVIDER_SITE_OTHER): Payer: Medicare Other | Admitting: Family Medicine

## 2017-10-11 ENCOUNTER — Encounter: Payer: Self-pay | Admitting: Family Medicine

## 2017-10-11 VITALS — BP 110/70 | HR 72 | Temp 98.3°F | Wt 212.7 lb

## 2017-10-11 DIAGNOSIS — E1165 Type 2 diabetes mellitus with hyperglycemia: Secondary | ICD-10-CM | POA: Diagnosis not present

## 2017-10-11 DIAGNOSIS — E7800 Pure hypercholesterolemia, unspecified: Secondary | ICD-10-CM

## 2017-10-11 DIAGNOSIS — I10 Essential (primary) hypertension: Secondary | ICD-10-CM | POA: Diagnosis not present

## 2017-10-11 LAB — POCT GLYCOSYLATED HEMOGLOBIN (HGB A1C): HEMOGLOBIN A1C: 8.5

## 2017-10-11 NOTE — Progress Notes (Signed)
Subjective:     Patient ID: Lucas Warren, male   DOB: [birth year], 72 y.o.   MRN: [id number]  HPI Patient seen for follow-up type 2 diabetes, hypertension, hyperlipidemia. Medications reviewed. Recent addition of Jardiance. He has seen some improvement in fasting blood sugars usually ranging between 115-130. Poor compliance over the holidays. His weight is up almost 3 pounds from back in November. His A1c had improved to 7.9% November. He's had previous intolerance with GLP-1 class. He declines insulin at this time.  Hypertension treated with lisinopril. No dizziness. No headaches. No chest pains.  Hyperlipidemia on simvastatin. No myalgias. Lipids are checked last May and stable.  Past Medical History:  Diagnosis Date  . Allergy    seasonal  . Anxiety   . Chest pain, atypical   . Colon polyps    hyperplastic 2003  . DJD (degenerative joint disease)   . DM (diabetes mellitus) ([place])   . GERD (gastroesophageal reflux disease)   . History of headache   . Hypercholesteremia   . Psoriasis    Past Surgical History:  Procedure Laterality Date  . INGUINAL HERNIA REPAIR  1989  . NASAL SEPTUM SURGERY  1989    reports that he quit smoking about 36 years ago. His smoking use included cigarettes. He has a 40.00 pack-year smoking history. he has never used smokeless tobacco. He reports that he drinks alcohol. He reports that he does not use drugs. family history includes Alzheimer's disease in his father; Heart disease in his father; Leukemia in his sister; Lung cancer in his mother. Allergies  Allergen Reactions  . Lisinopril Cough  . Erythromycin     REACTION: abd pain  . Trulicity [Dulaglutide] Nausea Only     Review of Systems  Constitutional: Negative for fatigue.  Eyes: Negative for visual disturbance.  Respiratory: Negative for cough, chest tightness and shortness of breath.   Cardiovascular: Negative for chest pain, palpitations and leg swelling.  Endocrine: Negative for  polydipsia and polyuria.  Neurological: Negative for dizziness, syncope, weakness, light-headedness and headaches.       Objective:   Physical Exam  Constitutional: He is oriented to person, place, and time. He appears well-developed and well-nourished.  HENT:  Right Ear: External ear normal.  Left Ear: External ear normal.  Mouth/Throat: Oropharynx is clear and moist.  Eyes: Pupils are equal, round, and reactive to light.  Neck: Neck supple. No thyromegaly present.  Cardiovascular: Normal rate and regular rhythm.  Pulmonary/Chest: Effort normal and breath sounds normal. No respiratory distress. He has no wheezes. He has no rales.  Musculoskeletal: He exhibits no edema.  Neurological: He is alert and oriented to person, place, and time. No cranial nerve deficit.       Assessment:     #1 type 2 diabetes poorly controlled with A1c today back up to 8.5% likely reflecting his recent weight gain and poor compliance with diet  #2 hypertension stable and at goal  #3 dyslipidemia on simvastatin    Plan:     -Discussed options for management. We discussed possible addition of insulin versus lifestyle modification and he is requesting 3 month trial of losing some weight. We discussed strategies in detail -Continue current medications and reassess in 3 months  [name] MD [facility] at [facility]

## 2017-10-11 NOTE — Patient Instructions (Signed)
Tighten up diet and lose some weight Let's plan on 3 month follow up and if not improved then, may need to consider insulin.

## 2017-11-01 ENCOUNTER — Ambulatory Visit: Payer: Medicare Other | Admitting: Family Medicine

## 2017-11-17 ENCOUNTER — Other Ambulatory Visit: Payer: Self-pay | Admitting: Family Medicine

## 2017-11-18 ENCOUNTER — Other Ambulatory Visit: Payer: Self-pay | Admitting: Family Medicine

## 2017-12-10 ENCOUNTER — Other Ambulatory Visit: Payer: Self-pay | Admitting: Family Medicine

## 2018-01-10 ENCOUNTER — Encounter: Payer: Self-pay | Admitting: Family Medicine

## 2018-01-10 ENCOUNTER — Ambulatory Visit (INDEPENDENT_AMBULATORY_CARE_PROVIDER_SITE_OTHER): Payer: Medicare Other | Admitting: Family Medicine

## 2018-01-10 VITALS — BP 110/70 | HR 66 | Temp 97.9°F | Wt 211.8 lb

## 2018-01-10 DIAGNOSIS — H9313 Tinnitus, bilateral: Secondary | ICD-10-CM

## 2018-01-10 DIAGNOSIS — I10 Essential (primary) hypertension: Secondary | ICD-10-CM

## 2018-01-10 DIAGNOSIS — E1165 Type 2 diabetes mellitus with hyperglycemia: Secondary | ICD-10-CM | POA: Diagnosis not present

## 2018-01-10 LAB — BASIC METABOLIC PANEL
BUN: 25 mg/dL — ABNORMAL HIGH (ref 6–23)
CALCIUM: 9.3 mg/dL (ref 8.4–10.5)
CO2: 26 meq/L (ref 19–32)
CREATININE: 1.11 mg/dL (ref 0.40–1.50)
Chloride: 106 mEq/L (ref 96–112)
GFR: 69.61 mL/min (ref >60.00)
Glucose, Bld: 146 mg/dL — ABNORMAL HIGH (ref 70–99)
Potassium: 4.6 mEq/L (ref 3.5–5.1)
SODIUM: 140 meq/L (ref 135–145)

## 2018-01-10 LAB — LIPID PANEL
CHOL/HDL RATIO: 5
CHOLESTEROL: 217 mg/dL — AB (ref 0–200)
HDL: 42.8 mg/dL (ref >39.00)
LDL Cholesterol: 137 mg/dL — ABNORMAL HIGH (ref 0–99)
NonHDL: 174.4
TRIGLYCERIDES: 187 mg/dL — AB (ref 0.0–149.0)
VLDL: 37.4 mg/dL (ref 0.0–40.0)

## 2018-01-10 LAB — MICROALBUMIN / CREATININE URINE RATIO
CREATININE, U: 108.7 mg/dL
MICROALB/CREAT RATIO: 3.9 mg/g (ref 0.0–30.0)
Microalb, Ur: 4.3 mg/dL — ABNORMAL HIGH (ref 0.0–1.9)

## 2018-01-10 LAB — POCT GLYCOSYLATED HEMOGLOBIN (HGB A1C): HEMOGLOBIN A1C: 7.9

## 2018-01-10 MED ORDER — GLUCOSE BLOOD VI STRP: ORAL_STRIP | 2 refills | Status: DC

## 2018-01-10 MED ORDER — ONETOUCH LANCETS MISC: 3 refills | Status: DC

## 2018-01-10 NOTE — Progress Notes (Signed)
Subjective:     Patient ID: Lucas Warren, male   DOB: [birth year], 72 y.o.   MRN: [id number]  HPI Is seen for medical follow-up. He has history of type 2 diabetes with poor control, obesity, GERD, psoriasis. Last A1c 8.5%. His weight is essentially unchanged. Not getting much exercise except some walking with golf. His fasting blood sugars have improved with mostly low 100s. Overall, he has seen some improvements in blood sugar since starting Jardiance. Denies any side effects from medication. No recent hypoglycemia  Previously treated for hypertension with lisinopril. Blood pressures recently stable off medication. Needs follow-up microalbumin screen. His lipids have been fairly stable. He's been tried previously on Crestor but he states this elevated his blood sugar and he states he had myalgias and muscle weakness with Zocor. He is declining further statin use at this time.  He complains of some bilateral ringing in years and possibly some mild bilateral hearing loss. Has not had hearing tested ever that his knowledge. No recent lower moist exposure. Only takes 81 mg aspirin daily. No vertigo.  Past Medical History:  Diagnosis Date  . Allergy    seasonal  . Anxiety   . Chest pain, atypical   . Colon polyps    hyperplastic 2003  . DJD (degenerative joint disease)   . DM (diabetes mellitus) ([place])   . GERD (gastroesophageal reflux disease)   . History of headache   . Hypercholesteremia   . Psoriasis    Past Surgical History:  Procedure Laterality Date  . INGUINAL HERNIA REPAIR  1989  . NASAL SEPTUM SURGERY  1989    reports that he quit smoking about 36 years ago. His smoking use included cigarettes. He has a 40.00 pack-year smoking history. He has never used smokeless tobacco. He reports that he drinks alcohol. He reports that he does not use drugs. family history includes Alzheimer's disease in his father; Heart disease in his father; Leukemia in his sister; Lung cancer in his  mother. Allergies  Allergen Reactions  . Lisinopril Cough  . Erythromycin     REACTION: abd pain  . Trulicity [Dulaglutide] Nausea Only     Review of Systems  Constitutional: Negative for fatigue and unexpected weight change.  HENT: Positive for tinnitus.   Eyes: Negative for visual disturbance.  Respiratory: Negative for cough, chest tightness and shortness of breath.   Cardiovascular: Negative for chest pain, palpitations and leg swelling.  Endocrine: Negative for polydipsia and polyuria.  Neurological: Negative for dizziness, syncope, weakness, light-headedness and headaches.       Objective:   Physical Exam  Constitutional: He is oriented to person, place, and time. He appears well-developed and well-nourished.  HENT:  Right Ear: External ear normal.  Left Ear: External ear normal.  Mouth/Throat: Oropharynx is clear and moist.  Eyes: Pupils are equal, round, and reactive to light.  Neck: Neck supple. No thyromegaly present.  Cardiovascular: Normal rate and regular rhythm.  Pulmonary/Chest: Effort normal and breath sounds normal. No respiratory distress. He has no wheezes. He has no rales.  Musculoskeletal: He exhibits no edema.  Neurological: He is alert and oriented to person, place, and time.       Assessment:     #1 type 2 diabetes slightly improved with A1c today 7.9%  #2 history of previous intolerance with statins and patient declining further statin use  #3 bilateral tinnitus. Suspect related to age-related sensorineural hearing loss.    Plan:     -We discussed further options regarding  his diabetes. He is declining further medication this point. Like him to lose some weight and reassess in 3 months. Hopefully he can continue to drive this down with lifestyle change  -We again discussed statin use and he declines any further statin at this point.  -Check further labs with lipid panel, basic metabolic panel, and urine microalbumin screen  -Continue with  yearly eye exam  -Suggested he see audiologist for hearing assessment  [name] MD [facility] at [facility]

## 2018-01-10 NOTE — Patient Instructions (Addendum)
Tinnitus Tinnitus refers to hearing a sound when there is no actual source for that sound. This is often described as ringing in the ears. However, people with this condition may hear a variety of noises. A person may hear the sound in one ear or in both ears. The sounds of tinnitus can be soft, loud, or somewhere in between. Tinnitus can last for a few seconds or can be constant for days. It may go away without treatment and come back at various times. When tinnitus is constant or happens often, it can lead to other problems, such as trouble sleeping and trouble concentrating. Almost everyone experiences tinnitus at some point. Tinnitus that is long-lasting (chronic) or comes back often is a problem that may require medical attention. What are the causes? The cause of tinnitus is often not known. In some cases, it can result from other problems or conditions, including:  Exposure to loud noises from machinery, music, or other sources.  Hearing loss.  Ear or sinus infections.  Earwax buildup.  A foreign object in the ear.  Use of certain medicines.  Use of alcohol and caffeine.  High blood pressure.  Heart diseases.  Anemia.  Allergies.  Meniere disease.  Thyroid problems.  Tumors.  An enlarged part of a weakened blood vessel (aneurysm).  What are the signs or symptoms? The main symptom of tinnitus is hearing a sound when there is no source for that sound. It may sound like:  Buzzing.  Roaring.  Ringing.  Blowing air, similar to the sound heard when you listen to a seashell.  Hissing.  Whistling.  Sizzling.  Humming.  Running water.  A sustained musical note.  How is this diagnosed? Tinnitus is diagnosed based on your symptoms. Your health care provider will do a physical exam. A comprehensive hearing exam (audiologic exam) will be done if your tinnitus:  Affects only one ear (unilateral).  Causes hearing difficulties.  Lasts 6 months or  longer.  You may also need to see a health care provider who specializes in hearing disorders (audiologist). You may be asked to complete a questionnaire to determine the severity of your tinnitus. Tests may be done to help determine the cause and to rule out other conditions. These can include:  Imaging studies of your head and brain, such as: ? A CT scan. ? An MRI.  An imaging study of your blood vessels (angiogram).  How is this treated? Treating an underlying medical condition can sometimes make tinnitus go away. If your tinnitus continues, other treatments may include:  Medicines, such as certain antidepressants or sleeping aids.  Sound generators to mask the tinnitus. These include: ? Tabletop sound machines that play relaxing sounds to help you fall asleep. ? Wearable devices that fit in your ear and play sounds or music. ? A small device that uses headphones to deliver a signal embedded in music (acoustic neural stimulation). In time, this may change the pathways of your brain and make you less sensitive to tinnitus. This device is used for very severe cases when no other treatment is working.  Therapy and counseling to help you manage the stress of living with tinnitus.  Using hearing aids or cochlear implants, if your tinnitus is related to hearing loss.  Follow these instructions at home:  When possible, avoid being in loud places and being exposed to loud sounds.  Wear hearing protection, such as earplugs, when you are exposed to loud noises.  Do not take stimulants, such as nicotine,   alcohol, or caffeine.  Practice techniques for reducing stress, such as meditation, yoga, or deep breathing.  Use a white noise machine, a humidifier, or other devices to mask the sound of tinnitus.  Sleep with your head slightly raised. This may reduce the impact of tinnitus.  Try to get plenty of rest each night. Contact a health care provider if:  You have tinnitus in just one  ear.  Your tinnitus continues for 3 weeks or longer without stopping.  Home care measures are not helping.  You have tinnitus after a head injury.  You have tinnitus along with any of the following: ? Dizziness. ? Loss of balance. ? Nausea and vomiting. This information is not intended to replace advice given to you by your health care provider. Make sure you discuss any questions you have with your health care provider. Document Released: 08/17/2005 Document Revised: 04/19/2016 Document Reviewed: 01/17/2014 Elsevier Interactive Patient Education  2018 [employer].  Set up physical and Medicare Wellness visit.

## 2018-01-11 ENCOUNTER — Other Ambulatory Visit: Payer: Self-pay | Admitting: Family Medicine

## 2018-01-11 DIAGNOSIS — E7800 Pure hypercholesterolemia, unspecified: Secondary | ICD-10-CM

## 2018-01-11 MED ORDER — PRAVASTATIN SODIUM 20 MG PO TABS: 20.0000 mg | ORAL_TABLET | Freq: Every day | ORAL | 3 refills | Status: DC

## 2018-01-18 ENCOUNTER — Encounter: Payer: Self-pay | Admitting: Family Medicine

## 2018-01-18 ENCOUNTER — Ambulatory Visit (INDEPENDENT_AMBULATORY_CARE_PROVIDER_SITE_OTHER): Payer: Medicare Other

## 2018-01-18 ENCOUNTER — Ambulatory Visit (INDEPENDENT_AMBULATORY_CARE_PROVIDER_SITE_OTHER): Payer: Medicare Other | Admitting: Family Medicine

## 2018-01-18 VITALS — BP 130/80 | HR 60 | Temp 98.1°F | Ht 72.0 in | Wt 209.4 lb

## 2018-01-18 VITALS — BP 130/80 | HR 60 | Ht 76.0 in | Wt 209.0 lb

## 2018-01-18 DIAGNOSIS — Z0000 Encounter for general adult medical examination without abnormal findings: Secondary | ICD-10-CM

## 2018-01-18 MED ORDER — ONETOUCH LANCETS MISC: 3 refills | Status: DC

## 2018-01-18 NOTE — Progress Notes (Addendum)
Subjective:   Lucas Warren is a 72 y.o. male who presents for Medicare Annual/Subsequent preventive examination.  States he is not married One dtr with one grandson Does work one day a week Works at [employer] in [place]   Diet;  Stopped taking medicine for [employer] 187  A1c 7.9  Eats a lot of chicken;  Prime rib on occasion;  Discussed keeping diet clean Is pulling away from the table  Exercise Was walking; school [place] road where he lives Just starting back walking; walks 1.5 miles x 30 x 5  Plays golf one day a week  Has fun with golf game   Hit the doughnut hold   There are no preventive care reminders to display for this patient.  Educated regarding the shingrix       Objective:    Vitals: BP 130/80   Pulse 60   Ht 6\' 4"  (1.93 m)   Wt 209 lb (94.8 kg)   SpO2 (!) 60%   BMI 25.44 kg/m   Body mass index is 25.44 kg/m.  Advanced Directives 01/06/2017 03/25/2016  Does Patient Have a Medical Advance Directive? Yes Yes  Type of Advance Directive - Living will;Healthcare Power of Attorney    Tobacco Social History   Tobacco Use  Smoking Status Former Smoker  . Packs/day: 2.00  . Years: 20.00  . Pack years: 40.00  . Types: Cigarettes  . Last attempt to quit: 08/31/1981  . Years since quitting: 36.4  Smokeless Tobacco Never Used     Counseling given: Yes   Clinical Intake:    Past Medical History:  Diagnosis Date  . Allergy    seasonal  . Anxiety   . Chest pain, atypical   . Colon polyps    hyperplastic 2003  . DJD (degenerative joint disease)   . DM (diabetes mellitus) ([place])   . GERD (gastroesophageal reflux disease)   . History of headache   . Hypercholesteremia   . Psoriasis    Past Surgical History:  Procedure Laterality Date  . INGUINAL HERNIA REPAIR  1989  . NASAL SEPTUM SURGERY  1989   Family History  Problem Relation Age of Onset  . Lung cancer Mother   . Alzheimer's disease Father   . Heart disease Father   . Leukemia Sister     Social History   Socioeconomic History  . Marital status: Married    Spouse name: Lucas Warren  . Number of children: Not on file  . Years of education: Not on file  . Highest education level: Not on file  Occupational History    Comment: Patent examiner  Social Needs  . Financial resource strain: Not on file  . Food insecurity:    Worry: Not on file    Inability: Not on file  . Transportation needs:    Medical: Not on file    Non-medical: Not on file  Tobacco Use  . Smoking status: Former Smoker    Packs/day: 2.00    Years: 20.00    Pack years: 40.00    Types: Cigarettes    Last attempt to quit: 08/31/1981    Years since quitting: 36.4  . Smokeless tobacco: Never Used  Substance and Sexual Activity  . Alcohol use: Yes    Comment: rarely a beer  . Drug use: No  . Sexual activity: Not on file  Lifestyle  . Physical activity:    Days per week: Not on file    Minutes per  session: Not on file  . Stress: Not on file  Relationships  . Social connections:    Talks on phone: Not on file    Gets together: Not on file    Attends religious service: Not on file    Active member of club or organization: Not on file    Attends meetings of clubs or organizations: Not on file    Relationship status: Not on file  Other Topics Concern  . Not on file  Social History Narrative  . Not on file    Outpatient Encounter Medications as of 01/18/2018  Medication Sig  . aspirin 81 MG tablet 1 tablet once per week  . azelastine (ASTELIN) 0.1 % nasal spray Place 1 spray into both nostrils 2 (two) times daily. Use in each nostril as directed  . CELECOXIB PO Using for 30 days.  . empagliflozin (JARDIANCE) 10 MG TABS tablet Take 10 mg by mouth daily.  Marland Kitchen glimepiride (AMARYL) 4 MG tablet TAKE 1 TABLET BY MOUTH ONCE DAILY WITH  BREAKFAST.  Marland Kitchen glucose blood test strip Check blood sugars once per day. DX: E11.65 one touch mini fine  . metFORMIN (GLUCOPHAGE) 500 MG tablet TAKE 2 TABLETS BY  MOUTH TWICE DAILY  . ONE TOUCH LANCETS MISC Test glucose once daily . One touch mini fine.  Dx E11.65  . ONGLYZA 5 MG TABS tablet TAKE 1 TABLET BY MOUTH ONCE DAILY  . pioglitazone (ACTOS) 45 MG tablet TAKE 1 TABLET BY MOUTH  DAILY  . pravastatin (PRAVACHOL) 20 MG tablet Take 1 tablet (20 mg total) by mouth daily.  . sildenafil (REVATIO) 20 MG tablet Take 2-5 tablets as directed  . vitamin B-12 (CYANOCOBALAMIN) 500 MCG tablet Take 500 mcg by mouth daily.   No facility-administered encounter medications on file as of 01/18/2018.     Activities of Daily Living No flowsheet data found.  Patient Care Team: [name], MD as PCP - General (Family Medicine)   Assessment:   This is a routine wellness examination for [name].  Exercise Activities and Dietary recommendations    Goals    . Patient Stated     To eat nutritiously   Check out  online nutrition programs as GumSearch.nl and http://vang.com/; fit20me; Look for foods with "whole" wheat; bran; oatmeal etc Shot at the farmer's markets in season for fresher choices  Watch for "hydrogenated" on the label of oils which are trans-fats.  Watch for "high fructose corn syrup" in snacks, yogurt or ketchup  Meats have less marbling; bright colored fruits and vegetables;  Canned; dump out liquid and wash vegetables. Be mindful of what we are eating  Portion control is essential to a health weight! Sit down; take a break and enjoy your meal; take smaller bites; put the fork down between bites;  It takes 20 minutes to get full; so check in with your fullness cues and stop eating when you start to fill full               Fall Risk Fall Risk  01/10/2018 01/06/2017 01/06/2017 12/25/2015 08/21/2014  Falls in the past year? No No No No No     Depression Screen PHQ 2/9 Scores 01/10/2018 01/06/2017 01/06/2017 12/25/2015  PHQ - 2 Score 3 0 0 0  PHQ- 9 Score 4 - - -    Cognitive Function MMSE - Mini Mental State Exam 01/06/2017  Not  completed: (No Data)     Ad8 score reviewed for issues:  Issues making decisions:  Less interest in hobbies / activities:  Repeats questions, stories (family complaining):  Trouble using ordinary gadgets (microwave, computer, phone):  Forgets the month or year:   Mismanaging finances:   Remembering appts:  Daily problems with thinking and/or memory: Ad8 score is=0 Dad and grand father had Alz        Immunization History  Administered Date(s) Administered  . Influenza Split 10/05/2011  . Influenza Whole 07/16/2009  . Influenza, High Dose Seasonal PF 05/22/2015, 06/29/2016, 06/15/2017  . Influenza,inj,Quad PF,6+ Mos 07/07/2013, 05/04/2014  . Pneumococcal Conjugate-13 08/21/2014  . Pneumococcal Polysaccharide-23 12/05/2012  . Pneumococcal-Unspecified 10/11/2007  . Td 03/09/2006  . Tdap 03/25/2016  . Zoster 03/31/2016    Screening Tests Health Maintenance  Topic Date Due  . PNA vac Low Risk Adult (2 of 2 - PPSV23) 03/16/2034 (Originally 12/05/2017)  . OPHTHALMOLOGY EXAM  02/15/2018  . INFLUENZA VACCINE  03/31/2018  . HEMOGLOBIN A1C  07/13/2018  . FOOT EXAM  01/19/2019  . COLONOSCOPY  06/10/2022  . TETANUS/TDAP  03/25/2026  . Hepatitis C Screening  Completed         Plan:      PCP Notes   Health Maintenance Discussed weight loss, DM education;  Has a lot of saturated fat in diet and did address this Will try to track with my fitness pal.  Hold on shingrix; had zoster 03/2016   There are no preventive care reminders to display for this patient.'  Abnormal Screens  None   Referrals  Discussed hearing issues and tinnitus which he feels is getting worse. We discussed options and he will go to an ENT and given a list of resources.   Patient concerns; As noted; weight loss; control of diabetes, walking again for exercise and having his hearing checked   Nurse Concerns; As noted  Next PCP apt today      I have personally reviewed and noted  the following in the patient's chart:   . Medical and social history . Use of alcohol, tobacco or illicit drugs  . Current medications and supplements . Functional ability and status . Nutritional status . Physical activity . Advanced directives . List of other physicians . Hospitalizations, surgeries, and ER visits in previous 12 months . Vitals . Screenings to include cognitive, depression, and falls . Referrals and appointments  In addition, I have reviewed and discussed with patient certain preventive protocols, quality metrics, and best practice recommendations. A written personalized care plan for preventive services as well as general preventive health recommendations were provided to patient.     [email or web address],[email or web address], RN  01/18/2018  I have reviewed the documentation for the AWV and [place] provided by the health coach and agree with their documentation. I was immediately available for any questions  [name] MD [facility] at [facility]

## 2018-01-18 NOTE — Patient Instructions (Addendum)
Lucas Warren , Thank you for taking time to come for your Medicare Wellness Visit. I appreciate your ongoing commitment to your health goals. Please review the following plan we discussed and let me know if I can assist you in the future.   Will start exercising 30 min x 5 days a week  Will cut back on saturated fat and continue your portion control   Will bring a copy of your meds and cost to Dr. Elease Hashimoto at your next visit   [employer]    Will see ENT for your tinnitus and hearing concerns    These are the goals we discussed: Goals    . Patient Stated     To eat nutritiously   Check out  online nutrition programs as GumSearch.nl and http://vang.com/; fit78me; Look for foods with "whole" wheat; bran; oatmeal etc Shot at the farmer's markets in season for fresher choices  Watch for "hydrogenated" on the label of oils which are trans-fats.  Watch for "high fructose corn syrup" in snacks, yogurt or ketchup  Meats have less marbling; bright colored fruits and vegetables;  Canned; dump out liquid and wash vegetables. Be mindful of what we are eating  Portion control is essential to a health weight! Sit down; take a break and enjoy your meal; take smaller bites; put the fork down between bites;  It takes 20 minutes to get full; so check in with your fullness cues and stop eating when you start to fill full               This is a list of the screening recommended for you and due dates:  Health Maintenance  Topic Date Due  . Pneumonia vaccines (2 of 2 - PPSV23) 03/16/2034*  . Eye exam for diabetics  02/15/2018  . Flu Shot  03/31/2018  . Hemoglobin A1C  07/13/2018  . Complete foot exam   01/19/2019  . Colon Cancer Screening  06/10/2022  . Tetanus Vaccine  03/25/2026  .  Hepatitis C: One time screening is recommended by Center for Disease Control  (CDC) for  adults born from [birth year] through 1963.   Completed  *Topic was postponed. The date shown is not  the original due date.      Fall Prevention in the Home Falls can cause injuries. They can happen to people of all ages. There are many things you can do to make your home safe and to help prevent falls. What can I do on the outside of my home?  Regularly fix the edges of walkways and driveways and fix any cracks.  Remove anything that might make you trip as you walk through a door, such as a raised step or threshold.  Trim any bushes or trees on the path to your home.  Use bright outdoor lighting.  Clear any walking paths of anything that might make someone trip, such as rocks or tools.  Regularly check to see if handrails are loose or broken. Make sure that both sides of any steps have handrails.  Any raised decks and porches should have guardrails on the edges.  Have any leaves, snow, or ice cleared regularly.  Use sand or salt on walking paths during winter.  Clean up any spills in your garage right away. This includes oil or grease spills. What can I do in the bathroom?  Use night lights.  Install grab bars by the toilet and in the tub and shower. Do not use towel bars as grab  bars.  Use non-skid mats or decals in the tub or shower.  If you need to sit down in the shower, use a plastic, non-slip stool.  Keep the floor dry. Clean up any water that spills on the floor as soon as it happens.  Remove soap buildup in the tub or shower regularly.  Attach bath mats securely with double-sided non-slip rug tape.  Do not have throw rugs and other things on the floor that can make you trip. What can I do in the bedroom?  Use night lights.  Make sure that you have a light by your bed that is easy to reach.  Do not use any sheets or blankets that are too big for your bed. They should not hang down onto the floor.  Have a firm chair that has side arms. You can use this for support while you get dressed.  Do not have throw rugs and other things on the floor that can make  you trip. What can I do in the kitchen?  Clean up any spills right away.  Avoid walking on wet floors.  Keep items that you use a lot in easy-to-reach places.  If you need to reach something above you, use a strong step stool that has a grab bar.  Keep electrical cords out of the way.  Do not use floor polish or wax that makes floors slippery. If you must use wax, use non-skid floor wax.  Do not have throw rugs and other things on the floor that can make you trip. What can I do with my stairs?  Do not leave any items on the stairs.  Make sure that there are handrails on both sides of the stairs and use them. Fix handrails that are broken or loose. Make sure that handrails are as long as the stairways.  Check any carpeting to make sure that it is firmly attached to the stairs. Fix any carpet that is loose or worn.  Avoid having throw rugs at the top or bottom of the stairs. If you do have throw rugs, attach them to the floor with carpet tape.  Make sure that you have a light switch at the top of the stairs and the bottom of the stairs. If you do not have them, ask someone to add them for you. What else can I do to help prevent falls?  Wear shoes that: ? Do not have high heels. ? Have rubber bottoms. ? Are comfortable and fit you well. ? Are closed at the toe. Do not wear sandals.  If you use a stepladder: ? Make sure that it is fully opened. Do not climb a closed stepladder. ? Make sure that both sides of the stepladder are locked into place. ? Ask someone to hold it for you, if possible.  Clearly mark and make sure that you can see: ? Any grab bars or handrails. ? First and last steps. ? Where the edge of each step is.  Use tools that help you move around (mobility aids) if they are needed. These include: ? Canes. ? Walkers. ? Scooters. ? Crutches.  Turn on the lights when you go into a dark area. Replace any light bulbs as soon as they burn out.  Set up your  furniture so you have a clear path. Avoid moving your furniture around.  If any of your floors are uneven, fix them.  If there are any pets around you, be aware of where they are.  Review  your medicines with your doctor. Some medicines can make you feel dizzy. This can increase your chance of falling. Ask your doctor what other things that you can do to help prevent falls. This information is not intended to replace advice given to you by your health care provider. Make sure you discuss any questions you have with your health care provider. Document Released: 06/13/2009 Document Revised: 01/23/2016 Document Reviewed: 09/21/2014 Elsevier Interactive Patient Education  2018 [place] Maintenance, Male A healthy lifestyle and preventive care is important for your health and wellness. Ask your health care provider about what schedule of regular examinations is right for you. What should I know about weight and diet? Eat a Healthy Diet  Eat plenty of vegetables, fruits, whole grains, low-fat dairy products, and lean protein.  Do not eat a lot of foods high in solid fats, added sugars, or salt.  Maintain a Healthy Weight Regular exercise can help you achieve or maintain a healthy weight. You should:  Do at least 150 minutes of exercise each week. The exercise should increase your heart rate and make you sweat (moderate-intensity exercise).  Do strength-training exercises at least twice a week.  Watch Your Levels of Cholesterol and Blood Lipids  Have your blood tested for lipids and cholesterol every 5 years starting at 70 years of age. If you are at high risk for heart disease, you should start having your blood tested when you are 70 years old. You may need to have your cholesterol levels checked more often if: ? Your lipid or cholesterol levels are high. ? You are older than 70 years of age. ? You are at high risk for heart disease.  What should I know about cancer  screening? Many types of cancers can be detected early and may often be prevented. Lung Cancer  You should be screened every year for lung cancer if: ? You are a current smoker who has smoked for at least 30 years. ? You are a former smoker who has quit within the past 15 years.  Talk to your health care provider about your screening options, when you should start screening, and how often you should be screened.  Colorectal Cancer  Routine colorectal cancer screening usually begins at 70 years of age and should be repeated every 5-10 years until you are 70 years old. You may need to be screened more often if early forms of precancerous polyps or small growths are found. Your health care provider may recommend screening at an earlier age if you have risk factors for colon cancer.  Your health care provider may recommend using home test kits to check for hidden blood in the stool.  A small camera at the end of a tube can be used to examine your colon (sigmoidoscopy or colonoscopy). This checks for the earliest forms of colorectal cancer.  Prostate and Testicular Cancer  Depending on your age and overall health, your health care provider may do certain tests to screen for prostate and testicular cancer.  Talk to your health care provider about any symptoms or concerns you have about testicular or prostate cancer.  Skin Cancer  Check your skin from head to toe regularly.  Tell your health care provider about any new moles or changes in moles, especially if: ? There is a change in a mole's size, shape, or color. ? You have a mole that is larger than a pencil eraser.  Always use sunscreen. Apply sunscreen liberally and repeat throughout  the day.  Protect yourself by wearing long sleeves, pants, a wide-brimmed hat, and sunglasses when outside.  What should I know about heart disease, diabetes, and high blood pressure?  If you are 2-17 years of age, have your blood pressure checked  every 3-5 years. If you are 71 years of age or older, have your blood pressure checked every year. You should have your blood pressure measured twice-once when you are at a hospital or clinic, and once when you are not at a hospital or clinic. Record the average of the two measurements. To check your blood pressure when you are not at a hospital or clinic, you can use: ? An automated blood pressure machine at a pharmacy. ? A home blood pressure monitor.  Talk to your health care provider about your target blood pressure.  If you are between 69-73 years old, ask your health care provider if you should take aspirin to prevent heart disease.  Have regular diabetes screenings by checking your fasting blood sugar level. ? If you are at a normal weight and have a low risk for diabetes, have this test once every three years after the age of 88. ? If you are overweight and have a high risk for diabetes, consider being tested at a younger age or more often.  A one-time screening for abdominal aortic aneurysm (AAA) by ultrasound is recommended for men aged 3-75 years who are current or former smokers. What should I know about preventing infection? Hepatitis B If you have a higher risk for hepatitis B, you should be screened for this virus. Talk with your health care provider to find out if you are at risk for hepatitis B infection. Hepatitis C Blood testing is recommended for:  Everyone born from [birth year] through 1965.  Anyone with known risk factors for hepatitis C.  Sexually Transmitted Diseases (STDs)  You should be screened each year for STDs including gonorrhea and chlamydia if: ? You are sexually active and are younger than 70 years of age. ? You are older than 70 years of age and your health care provider tells you that you are at risk for this type of infection. ? Your sexual activity has changed since you were last screened and you are at an increased risk for chlamydia or gonorrhea. Ask your  health care provider if you are at risk.  Talk with your health care provider about whether you are at high risk of being infected with HIV. Your health care provider may recommend a prescription medicine to help prevent HIV infection.  What else can I do?  Schedule regular health, dental, and eye exams.  Stay current with your vaccines (immunizations).  Do not use any tobacco products, such as cigarettes, chewing tobacco, and e-cigarettes. If you need help quitting, ask your health care provider.  Limit alcohol intake to no more than 2 drinks per day. One drink equals 12 ounces of beer, 5 ounces of wine, or 1 ounces of hard liquor.  Do not use street drugs.  Do not share needles.  Ask your health care provider for help if you need support or information about quitting drugs.  Tell your health care provider if you often feel depressed.  Tell your health care provider if you have ever been abused or do not feel safe at home. This information is not intended to replace advice given to you by your health care provider. Make sure you discuss any questions you have with your health care provider. Document  Released: 02/13/2008 Document Revised: 04/15/2016 Document Reviewed: 05/21/2015 Elsevier Interactive Patient Education  2018 [employer].   Hearing Loss Hearing loss is a partial or total loss of the ability to hear. This can be temporary or permanent, and it can happen in one or both ears. Hearing loss may be referred to as deafness. Medical care is necessary to treat hearing loss properly and to prevent the condition from getting worse. Your hearing may partially or completely come back, depending on what caused your hearing loss and how severe it is. In some cases, hearing loss is permanent. What are the causes? Common causes of hearing loss include:  Too much wax in the ear canal.  Infection of the ear canal or middle ear.  Fluid in the middle ear.  Injury to the ear or  surrounding area.  An object stuck in the ear.  Prolonged exposure to loud sounds, such as music.  Less common causes of hearing loss include:  Tumors in the ear.  Viral or bacterial infections, such as meningitis.  A hole in the eardrum (perforated eardrum).  Problems with the hearing nerve that sends signals between the brain and the ear.  Certain medicines.  What are the signs or symptoms? Symptoms of this condition may include:  Difficulty telling the difference between sounds.  Difficulty following a conversation when there is background noise.  Lack of response to sounds in your environment. This may be most noticeable when you do not respond to startling sounds.  Needing to turn up the volume on the television, radio, etc.  Ringing in the ears.  Dizziness.  Pain in the ears.  How is this diagnosed? This condition is diagnosed based on a physical exam and a hearing test (audiometry). The audiometry test will be performed by a hearing specialist (audiologist). You may also be referred to an ear, nose, and throat (ENT) specialist (otolaryngologist). How is this treated? Treatment for recent onset of hearing loss may include:  Ear wax removal.  Being prescribed medicines to prevent infection (antibiotics).  Being prescribed medicines to reduce inflammation (corticosteroids).  Follow these instructions at home:  If you were prescribed an antibiotic medicine, take it as told by your health care provider. Do not stop taking the antibiotic even if you start to feel better.  Take over-the-counter and prescription medicines only as told by your health care provider.  Avoid loud noises.  Return to your normal activities as told by your health care provider. Ask your health care provider what activities are safe for you.  Keep all follow-up visits as told by your health care provider. This is important. Contact a health care provider if:  You feel dizzy.  You  develop new symptoms.  You vomit or feel nauseous.  You have a fever. Get help right away if:  You develop sudden changes in your vision.  You have severe ear pain.  You have new or increased weakness.  You have a severe headache. This information is not intended to replace advice given to you by your health care provider. Make sure you discuss any questions you have with your health care provider. Document Released: 08/17/2005 Document Revised: 01/23/2016 Document Reviewed: 01/02/2015 Elsevier Interactive Patient Education  2018 [employer].

## 2018-01-18 NOTE — Progress Notes (Signed)
Subjective:     Patient ID: Lucas Warren, male   DOB: 01-06-1946, 72 y.o.   MRN: [id number]  HPI Patient is here for physical exam. He does had recent labs with elevated lipids and we started back pravastatin. He had concern with myalgias on simvastatin previously. His colonoscopy is up-to-date. He's had previous shingles vaccine. He had abdominal aortic aneurysm screening year ago. Immunizations up-to-date.  History of poorly controlled type 2 diabetes. We added Januvia several months ago is lost substantial weight and A1c's are gradually improving. Recent A1c 7.9%. He hopes to initiate more walking soon  Past Medical History:  Diagnosis Date  . Allergy    seasonal  . Anxiety   . Chest pain, atypical   . Colon polyps    hyperplastic 2003  . DJD (degenerative joint disease)   . DM (diabetes mellitus) ([place])   . GERD (gastroesophageal reflux disease)   . History of headache   . Hypercholesteremia   . Psoriasis    Past Surgical History:  Procedure Laterality Date  . INGUINAL HERNIA REPAIR  1989  . NASAL SEPTUM SURGERY  1989    reports that he quit smoking about 36 years ago. His smoking use included cigarettes. He has a 40.00 pack-year smoking history. He has never used smokeless tobacco. He reports that he drinks alcohol. He reports that he does not use drugs. family history includes Alzheimer's disease in his father; Heart disease in his father; Leukemia in his sister; Lung cancer in his mother. Allergies  Allergen Reactions  . Lisinopril Cough  . Erythromycin     REACTION: abd pain  . Trulicity [Dulaglutide] Nausea Only     Review of Systems  Constitutional: Negative for activity change, appetite change, fatigue and fever.  HENT: Negative for congestion, ear pain and trouble swallowing.   Eyes: Negative for pain and visual disturbance.  Respiratory: Negative for cough, shortness of breath and wheezing.   Cardiovascular: Negative for chest pain and palpitations.   Gastrointestinal: Negative for abdominal distention, abdominal pain, blood in stool, constipation, diarrhea, nausea, rectal pain and vomiting.  Genitourinary: Negative for dysuria, hematuria and testicular pain.  Musculoskeletal: Negative for arthralgias and joint swelling.  Skin: Negative for rash.  Neurological: Negative for dizziness, syncope and headaches.  Hematological: Negative for adenopathy.  Psychiatric/Behavioral: Negative for confusion and dysphoric mood.       Objective:   Physical Exam  Constitutional: He is oriented to person, place, and time. He appears well-developed and well-nourished. No distress.  HENT:  Head: Normocephalic and atraumatic.  Right Ear: External ear normal.  Left Ear: External ear normal.  Mouth/Throat: Oropharynx is clear and moist.  Eyes: Pupils are equal, round, and reactive to light. Conjunctivae and EOM are normal.  Neck: Normal range of motion. Neck supple. No thyromegaly present.  Cardiovascular: Normal rate, regular rhythm and normal heart sounds.  No murmur heard. Pulmonary/Chest: No respiratory distress. He has no wheezes. He has no rales.  Abdominal: Soft. Bowel sounds are normal. He exhibits no distension and no mass. There is no tenderness. There is no rebound and no guarding.  Musculoskeletal: He exhibits no edema.  Lymphadenopathy:    He has no cervical adenopathy.  Neurological: He is alert and oriented to person, place, and time. He displays normal reflexes. No cranial nerve deficit.  Skin: No rash noted.  Psychiatric: He has a normal mood and affect.       Assessment:     Physical exam. The following issues were addressed  Plan:     -We recommended additional weight loss and would like to see his weight eventually below 200 pounds. -Establish more consistent exercise with walking -Continue with yearly flu vaccine -We'll plan routine follow-up in 4 months and recheck A1c -Recent labs reviewed with patient. We will  consider follow-up PSA with next lab draw 4 months from now -Patient has Medicare annual wellness visit later today -He did smoke previously but quit 1983. He is not candidate for low-dose CT lung cancer screening  [name] MD [facility] at [facility]

## 2018-01-28 ENCOUNTER — Other Ambulatory Visit: Payer: Self-pay | Admitting: Family Medicine

## 2018-02-19 ENCOUNTER — Other Ambulatory Visit: Payer: Self-pay | Admitting: Family Medicine

## 2018-02-28 DIAGNOSIS — H52223 Regular astigmatism, bilateral: Secondary | ICD-10-CM | POA: Diagnosis not present

## 2018-02-28 DIAGNOSIS — E119 Type 2 diabetes mellitus without complications: Secondary | ICD-10-CM | POA: Diagnosis not present

## 2018-02-28 LAB — HM DIABETES EYE EXAM

## 2018-03-08 ENCOUNTER — Encounter: Payer: Self-pay | Admitting: Family Medicine

## 2018-03-21 ENCOUNTER — Other Ambulatory Visit (INDEPENDENT_AMBULATORY_CARE_PROVIDER_SITE_OTHER): Payer: Medicare Other

## 2018-03-21 DIAGNOSIS — E7800 Pure hypercholesterolemia, unspecified: Secondary | ICD-10-CM | POA: Diagnosis not present

## 2018-03-21 LAB — LIPID PANEL
CHOL/HDL RATIO: 4
Cholesterol: 166 mg/dL (ref 0–200)
HDL: 45.7 mg/dL (ref >39.00)
LDL Cholesterol: 90 mg/dL (ref 0–99)
NONHDL: 120.1
Triglycerides: 152 mg/dL — ABNORMAL HIGH (ref 0.0–149.0)
VLDL: 30.4 mg/dL (ref 0.0–40.0)

## 2018-03-21 LAB — HEPATIC FUNCTION PANEL
ALT: 11 U/L (ref 0–53)
AST: 10 U/L (ref 0–37)
Albumin: 4.3 g/dL (ref 3.5–5.2)
Alkaline Phosphatase: 52 U/L (ref 39–117)
Bilirubin, Direct: 0.1 mg/dL (ref 0.0–0.3)
Total Bilirubin: 0.5 mg/dL (ref 0.2–1.2)
Total Protein: 7.1 g/dL (ref 6.0–8.3)

## 2018-03-29 ENCOUNTER — Other Ambulatory Visit: Payer: Self-pay | Admitting: Family Medicine

## 2018-04-29 DIAGNOSIS — L814 Other melanin hyperpigmentation: Secondary | ICD-10-CM | POA: Diagnosis not present

## 2018-04-29 DIAGNOSIS — D225 Melanocytic nevi of trunk: Secondary | ICD-10-CM | POA: Diagnosis not present

## 2018-04-29 DIAGNOSIS — L821 Other seborrheic keratosis: Secondary | ICD-10-CM | POA: Diagnosis not present

## 2018-04-29 DIAGNOSIS — Z85828 Personal history of other malignant neoplasm of skin: Secondary | ICD-10-CM | POA: Diagnosis not present

## 2018-04-29 DIAGNOSIS — L570 Actinic keratosis: Secondary | ICD-10-CM | POA: Diagnosis not present

## 2018-04-29 DIAGNOSIS — C4441 Basal cell carcinoma of skin of scalp and neck: Secondary | ICD-10-CM | POA: Diagnosis not present

## 2018-05-20 ENCOUNTER — Other Ambulatory Visit: Payer: Self-pay

## 2018-05-20 ENCOUNTER — Encounter: Payer: Self-pay | Admitting: Family Medicine

## 2018-05-20 ENCOUNTER — Ambulatory Visit (INDEPENDENT_AMBULATORY_CARE_PROVIDER_SITE_OTHER): Payer: Medicare Other | Admitting: Family Medicine

## 2018-05-20 ENCOUNTER — Ambulatory Visit: Payer: Medicare Other | Admitting: Family Medicine

## 2018-05-20 VITALS — BP 128/84 | HR 59 | Temp 98.0°F | Ht 72.0 in | Wt 211.7 lb

## 2018-05-20 DIAGNOSIS — R1032 Left lower quadrant pain: Secondary | ICD-10-CM | POA: Diagnosis not present

## 2018-05-20 DIAGNOSIS — E1165 Type 2 diabetes mellitus with hyperglycemia: Secondary | ICD-10-CM

## 2018-05-20 DIAGNOSIS — E7800 Pure hypercholesterolemia, unspecified: Secondary | ICD-10-CM

## 2018-05-20 DIAGNOSIS — Z23 Encounter for immunization: Secondary | ICD-10-CM

## 2018-05-20 LAB — POCT GLYCOSYLATED HEMOGLOBIN (HGB A1C): HEMOGLOBIN A1C: 7.6 % — AB (ref 4.0–5.6)

## 2018-05-20 NOTE — Progress Notes (Signed)
Subjective:     Patient ID: Lucas Warren, male   DOB: 02/08/1946, 72 y.o.   MRN: [id number]  HPI Follow-up type 2 diabetes. He's had some poor control we added Jardiance this past year he is seeing gradual improvement. A1c 7 months ago was 8.5% down to 7.6% today.  Recent back injury with some left lower lumbar pain. This occurred about 4 days ago when he was helping someone unload a truck. He is seeing a Restaurant manager, fast food for that.  Hyperlipidemia. Previous intolerance to other statins. We added pravastatin 20 mg daily and cholesterol went from 217 down to 166 Tolerating well with no myalgias. Denies any recent chest pains.  Recent fleeting left groin pain. Prior history of hernia repair back in the 80s. He has not seen any recent bulge. No dysuria.  No pain with ambulation.  Past Medical History:  Diagnosis Date  . Allergy    seasonal  . Anxiety   . Chest pain, atypical   . Colon polyps    hyperplastic 2003  . DJD (degenerative joint disease)   . DM (diabetes mellitus) ([place])   . GERD (gastroesophageal reflux disease)   . History of headache   . Hypercholesteremia   . Psoriasis    Past Surgical History:  Procedure Laterality Date  . CATARACT EXTRACTION, BILATERAL Bilateral    Dr. Tommy Rainwater;   . INGUINAL HERNIA REPAIR  1989  . NASAL SEPTUM SURGERY  1989    reports that he quit smoking about 36 years ago. His smoking use included cigarettes. He has a 40.00 pack-year smoking history. He has never used smokeless tobacco. He reports that he drinks alcohol. He reports that he does not use drugs. family history includes Alzheimer's disease in his father; Heart disease in his father; Leukemia in his sister; Lung cancer in his mother. Allergies  Allergen Reactions  . Lisinopril Cough  . Erythromycin     REACTION: abd pain  . Trulicity [Dulaglutide] Nausea Only     Review of Systems  Constitutional: Negative for fatigue and unexpected weight change.  Eyes: Negative for visual  disturbance.  Respiratory: Negative for cough, chest tightness and shortness of breath.   Cardiovascular: Negative for chest pain, palpitations and leg swelling.  Endocrine: Negative for polydipsia and polyuria.  Neurological: Negative for dizziness, syncope, weakness, light-headedness and headaches.       Objective:   Physical Exam  Constitutional: He is oriented to person, place, and time. He appears well-developed and well-nourished.  HENT:  Right Ear: External ear normal.  Left Ear: External ear normal.  Mouth/Throat: Oropharynx is clear and moist.  Eyes: Pupils are equal, round, and reactive to light.  Neck: Neck supple. No thyromegaly present.  Cardiovascular: Normal rate and regular rhythm.  Pulmonary/Chest: Effort normal and breath sounds normal. No respiratory distress. He has no wheezes. He has no rales.  Genitourinary:  Genitourinary Comments: Left groin examined. No evidence for hernia. No significant adenopathy.  Musculoskeletal: He exhibits no edema.  Neurological: He is alert and oriented to person, place, and time.       Assessment:     #1 type 2 diabetes improving with A1c 7.6%  #2 hyperlipidemia improved on pravastatin  #3 mild left groin pain. No signs of recurrent hernia    Plan:     -observation regarding groin pain. Touch base if not resolving next couple of weeks -Continue low glycemic diet and plan routine follow-up in 3 months. -flu vaccine given  [name] MD [facility] at [facility]

## 2018-05-22 ENCOUNTER — Other Ambulatory Visit: Payer: Self-pay | Admitting: Family Medicine

## 2018-06-02 ENCOUNTER — Other Ambulatory Visit: Payer: Self-pay | Admitting: Family Medicine

## 2018-07-27 ENCOUNTER — Telehealth: Payer: Self-pay | Admitting: Family Medicine

## 2018-07-27 NOTE — Telephone Encounter (Signed)
I suggest follow up by late December to discuss and repeat A1C due then anyway.  We can go over options for treatment then.

## 2018-07-27 NOTE — Telephone Encounter (Signed)
I called the pt and informed him of the message below.  He stated he has enough pills to last until his visit in January and if not he will call for an earlier appt.

## 2018-07-27 NOTE — Telephone Encounter (Signed)
Copied from [place] [id number]. Topic: Quick Communication - See Telephone Encounter >> Jul 27, 2018 12:42 PM Rutherford Nail, NT wrote: CRM for notification. See Telephone encounter for: 07/27/18. Patient calling and states that in 2020 his insurance will not be covering the [place]. Patient would like to know if he could come off the medication since his sugars have been ok? If not, would like to know if it could change to Januvia? Please advice. CB#: [phone]

## 2018-08-02 ENCOUNTER — Encounter: Payer: Self-pay | Admitting: Family Medicine

## 2018-08-02 ENCOUNTER — Ambulatory Visit (INDEPENDENT_AMBULATORY_CARE_PROVIDER_SITE_OTHER): Payer: Medicare Other | Admitting: Family Medicine

## 2018-08-02 ENCOUNTER — Other Ambulatory Visit: Payer: Self-pay

## 2018-08-02 VITALS — BP 110/60 | HR 76 | Temp 97.8°F | Ht 72.0 in | Wt 218.5 lb

## 2018-08-02 DIAGNOSIS — E1165 Type 2 diabetes mellitus with hyperglycemia: Secondary | ICD-10-CM

## 2018-08-02 DIAGNOSIS — I10 Essential (primary) hypertension: Secondary | ICD-10-CM

## 2018-08-02 DIAGNOSIS — E7800 Pure hypercholesterolemia, unspecified: Secondary | ICD-10-CM | POA: Diagnosis not present

## 2018-08-02 LAB — POCT GLYCOSYLATED HEMOGLOBIN (HGB A1C): Hemoglobin A1C: 8.1 % — AB (ref 4.0–5.6)

## 2018-08-02 MED ORDER — SITAGLIPTIN PHOSPHATE 100 MG PO TABS: 100.0000 mg | ORAL_TABLET | Freq: Every day | ORAL | 3 refills | Status: DC

## 2018-08-02 NOTE — Progress Notes (Signed)
Subjective:     Patient ID: Lucas Warren, male   DOB: 1945-09-14, 72 y.o.   MRN: [id number]  HPI Here for medical follow-up.  Type 2 diabetes.  History of marginal control.  Last A1c slowly improving at 7.6% but back up to 8.1% today.  He ihasgained about 7 pounds.  He attributes this to some recent neck and back difficulties and less exercise.  Poor compliance with diet.  He has been reluctant to consider insulin.  Previous intolerance with GLP-1 medications.  Is on Onglyza but his insurance would not cover this starting in January and he wishes to make a switch there  History of hypertension controlled with low-dose lisinopril No recent headaches or dizziness.  Denies any chest pains.  He has hyperlipidemia treated with pravastatin.  No myalgias.  Past Medical History:  Diagnosis Date  . Allergy    seasonal  . Anxiety   . Chest pain, atypical   . Colon polyps    hyperplastic 2003  . DJD (degenerative joint disease)   . DM (diabetes mellitus) ([place])   . GERD (gastroesophageal reflux disease)   . History of headache   . Hypercholesteremia   . Psoriasis    Past Surgical History:  Procedure Laterality Date  . CATARACT EXTRACTION, BILATERAL Bilateral    Dr. Tommy Rainwater;   . INGUINAL HERNIA REPAIR  1989  . NASAL SEPTUM SURGERY  1989    reports that he quit smoking about 36 years ago. His smoking use included cigarettes. He has a 40.00 pack-year smoking history. He has never used smokeless tobacco. He reports that he drinks alcohol. He reports that he does not use drugs. family history includes Alzheimer's disease in his father; Heart disease in his father; Leukemia in his sister; Lung cancer in his mother. Allergies  Allergen Reactions  . Lisinopril Cough  . Erythromycin     REACTION: abd pain  . Trulicity [Dulaglutide] Nausea Only       Review of Systems  Constitutional: Negative for fatigue and unexpected weight change.  Eyes: Negative for visual disturbance.  Respiratory:  Negative for cough, chest tightness and shortness of breath.   Cardiovascular: Negative for chest pain, palpitations and leg swelling.  Gastrointestinal: Negative for abdominal pain.  Endocrine: Negative for polydipsia and polyuria.  Neurological: Negative for dizziness, syncope, weakness, light-headedness and headaches.       Objective:   Physical Exam  Constitutional: He is oriented to person, place, and time. He appears well-developed and well-nourished.  Eyes: Pupils are equal, round, and reactive to light.  Neck: Neck supple. No thyromegaly present.  Cardiovascular: Normal rate and regular rhythm.  Pulmonary/Chest: Effort normal and breath sounds normal. No respiratory distress. He has no wheezes. He has no rales.  Musculoskeletal: He exhibits no edema.  Neurological: He is alert and oriented to person, place, and time.       Assessment:     #1 type 2 diabetes.  Suboptimal control with A1c 8.1%  #2 hypertension stable and well-controlled  #3 hyperlipidemia treated with pravastatin    Plan:     -Long discussion with patient regarding diabetes treatment options.  Previous intolerance with GLP-1 class.  He declines insulin at this time.  We will switch from Onglyza to Januvia 100 mg daily because of insurance issues.  He requests 3 months of lifestyle modification.  If not further to goal at that point consider long-acting insulin -Continue other current medications. -Increase regular exercise  [name] MD [facility]  at [facility]

## 2018-08-02 NOTE — Patient Instructions (Signed)
Try to get back to more consistent exercise and lose some weight  Start the Januvia in place of Onglyza- when the Onglyza runs out..  Let's plan on 3 month follow.

## 2018-08-07 ENCOUNTER — Other Ambulatory Visit: Payer: Self-pay | Admitting: Family Medicine

## 2018-08-15 ENCOUNTER — Ambulatory Visit: Payer: Medicare Other | Admitting: Family Medicine

## 2018-09-11 ENCOUNTER — Other Ambulatory Visit: Payer: Self-pay | Admitting: Family Medicine

## 2018-09-16 ENCOUNTER — Ambulatory Visit: Payer: Medicare Other | Admitting: Family Medicine

## 2018-10-10 ENCOUNTER — Other Ambulatory Visit: Payer: Self-pay | Admitting: Family Medicine

## 2018-10-10 DIAGNOSIS — R6889 Other general symptoms and signs: Secondary | ICD-10-CM | POA: Diagnosis not present

## 2018-10-20 DIAGNOSIS — R6889 Other general symptoms and signs: Secondary | ICD-10-CM | POA: Diagnosis not present

## 2018-11-01 ENCOUNTER — Other Ambulatory Visit: Payer: Self-pay

## 2018-11-01 ENCOUNTER — Encounter: Payer: Self-pay | Admitting: Family Medicine

## 2018-11-01 ENCOUNTER — Ambulatory Visit (INDEPENDENT_AMBULATORY_CARE_PROVIDER_SITE_OTHER): Payer: Medicare Other | Admitting: Family Medicine

## 2018-11-01 VITALS — BP 116/62 | HR 67 | Temp 97.8°F | Ht 72.0 in | Wt 216.6 lb

## 2018-11-01 DIAGNOSIS — E1165 Type 2 diabetes mellitus with hyperglycemia: Secondary | ICD-10-CM | POA: Diagnosis not present

## 2018-11-01 DIAGNOSIS — E7800 Pure hypercholesterolemia, unspecified: Secondary | ICD-10-CM

## 2018-11-01 DIAGNOSIS — E663 Overweight: Secondary | ICD-10-CM

## 2018-11-01 DIAGNOSIS — I10 Essential (primary) hypertension: Secondary | ICD-10-CM

## 2018-11-01 LAB — POCT GLYCOSYLATED HEMOGLOBIN (HGB A1C): Hemoglobin A1C: 7.8 % — AB (ref 4.0–5.6)

## 2018-11-01 NOTE — Patient Instructions (Signed)
Increase your exercise such as walking  Try to drop a few pounds  Lets plan on 3 month follow up.    Your A1C today is slightly improved at 7.8%.

## 2018-11-01 NOTE — Progress Notes (Signed)
Subjective:     Patient ID: Lucas Warren, male   DOB: 06-30-1946, 73 y.o.   MRN: [id number]  HPI Patient is seen for medical follow-up  Type 2 diabetes.  History of suboptimal control.  Last A1c 8.1%.  We had recommended consideration for long-acting insulin but patient declined.  He is on combination therapy with metformin, Amaryl, Januvia, Actos, and Jardiance.  Previous intolerance with Trulicity.  He had nausea and vomiting.  No consistent exercise though his weight is down a couple pounds from last visit.  Overall feels well.  Regular eye exams and last eye exam was in July of this past year  Hypertension treated with lisinopril.  Blood pressures been stable.  No headaches or dizziness.  No chest pains.  Hyperlipidemia on pravastatin.  No myalgias.  Past Medical History:  Diagnosis Date  . Allergy    seasonal  . Anxiety   . Chest pain, atypical   . Colon polyps    hyperplastic 2003  . DJD (degenerative joint disease)   . DM (diabetes mellitus) ([place])   . GERD (gastroesophageal reflux disease)   . History of headache   . Hypercholesteremia   . Psoriasis    Past Surgical History:  Procedure Laterality Date  . CATARACT EXTRACTION, BILATERAL Bilateral    Dr. Tommy Rainwater;   . INGUINAL HERNIA REPAIR  1989  . NASAL SEPTUM SURGERY  1989    reports that he quit smoking about 37 years ago. His smoking use included cigarettes. He has a 40.00 pack-year smoking history. He has never used smokeless tobacco. He reports current alcohol use. He reports that he does not use drugs. family history includes Alzheimer's disease in his father; Heart disease in his father; Leukemia in his sister; Lung cancer in his mother. Allergies  Allergen Reactions  . Lisinopril Cough  . Erythromycin     REACTION: abd pain  . Trulicity [Dulaglutide] Nausea Only     Review of Systems  Constitutional: Negative for fatigue.  Eyes: Negative for visual disturbance.  Respiratory: Negative for cough, chest  tightness and shortness of breath.   Cardiovascular: Negative for chest pain, palpitations and leg swelling.  Endocrine: Negative for polydipsia and polyuria.  Genitourinary: Negative for dysuria.  Neurological: Negative for dizziness, syncope, weakness, light-headedness and headaches.       Objective:   Physical Exam Constitutional:      Appearance: He is well-developed.  HENT:     Right Ear: External ear normal.     Left Ear: External ear normal.  Eyes:     Pupils: Pupils are equal, round, and reactive to light.  Neck:     Musculoskeletal: Neck supple.     Thyroid: No thyromegaly.  Cardiovascular:     Rate and Rhythm: Normal rate and regular rhythm.  Pulmonary:     Effort: Pulmonary effort is normal. No respiratory distress.     Breath sounds: Normal breath sounds. No wheezing or rales.  Neurological:     Mental Status: He is alert and oriented to person, place, and time.        Assessment:     #1 type 2 diabetes.  A1c slightly improved today to 7.8%  #2 hypertension stable and at goal  #3 dyslipidemia treated with pravastatin  #4 overweight with BMI 29    Plan:     -We have again discussed options including option for possible long-acting insulin and patient declines -He plans to step up exercise with walking and 31-month follow-up. -Continue current  medications -We discussed diet in some detail and recommend try to scale back starchy foods -We will check lipids and chemistries at follow-up  [name] MD [facility] at [facility]

## 2018-11-10 ENCOUNTER — Other Ambulatory Visit: Payer: Self-pay | Admitting: Family Medicine

## 2018-11-11 ENCOUNTER — Other Ambulatory Visit: Payer: Self-pay

## 2018-11-11 ENCOUNTER — Encounter: Payer: Self-pay | Admitting: Family Medicine

## 2018-11-11 ENCOUNTER — Ambulatory Visit (INDEPENDENT_AMBULATORY_CARE_PROVIDER_SITE_OTHER): Payer: Medicare Other | Admitting: Family Medicine

## 2018-11-11 VITALS — BP 108/58 | HR 68 | Temp 97.7°F | Wt 214.6 lb

## 2018-11-11 DIAGNOSIS — R350 Frequency of micturition: Secondary | ICD-10-CM

## 2018-11-11 DIAGNOSIS — R1032 Left lower quadrant pain: Secondary | ICD-10-CM | POA: Diagnosis not present

## 2018-11-11 LAB — POCT URINALYSIS DIPSTICK
Bilirubin, UA: NEGATIVE
Glucose, UA: POSITIVE — AB
KETONES UA: NEGATIVE
LEUKOCYTES UA: NEGATIVE
Nitrite, UA: NEGATIVE
PROTEIN UA: NEGATIVE
RBC UA: NEGATIVE
SPEC GRAV UA: 1.015 (ref 1.010–1.025)
UROBILINOGEN UA: 0.2 U/dL
pH, UA: 5 (ref 5.0–8.0)

## 2018-11-11 LAB — COMPREHENSIVE METABOLIC PANEL
ALT: 9 U/L (ref 0–53)
AST: 11 U/L (ref 0–37)
Albumin: 4.6 g/dL (ref 3.5–5.2)
Alkaline Phosphatase: 58 U/L (ref 39–117)
BUN: 20 mg/dL (ref 6–23)
CALCIUM: 9.8 mg/dL (ref 8.4–10.5)
CO2: 27 mEq/L (ref 19–32)
Chloride: 105 mEq/L (ref 96–112)
Creatinine, Ser: 1.43 mg/dL (ref 0.40–1.50)
GFR: 48.77 mL/min — ABNORMAL LOW (ref >60.00)
Glucose, Bld: 117 mg/dL — ABNORMAL HIGH (ref 70–99)
Potassium: 5 mEq/L (ref 3.5–5.1)
Sodium: 139 mEq/L (ref 135–145)
Total Bilirubin: 0.5 mg/dL (ref 0.2–1.2)
Total Protein: 7.2 g/dL (ref 6.0–8.3)

## 2018-11-11 LAB — CBC WITH DIFFERENTIAL/PLATELET
Basophils Absolute: 0.1 10*3/uL (ref 0.0–0.1)
Basophils Relative: 0.6 % (ref 0.0–3.0)
Eosinophils Absolute: 0.3 10*3/uL (ref 0.0–0.7)
Eosinophils Relative: 3.2 % (ref 0.0–5.0)
HCT: 42 % (ref 39.0–52.0)
Hemoglobin: 13.9 g/dL (ref 13.0–17.0)
LYMPHS ABS: 1.2 10*3/uL (ref 0.7–4.0)
Lymphocytes Relative: 13.3 % (ref 12.0–46.0)
MCHC: 33.1 g/dL (ref 30.0–36.0)
MCV: 85.5 fl (ref 78.0–100.0)
Monocytes Absolute: 0.7 10*3/uL (ref 0.1–1.0)
Monocytes Relative: 8 % (ref 3.0–12.0)
NEUTROS ABS: 6.8 10*3/uL (ref 1.4–7.7)
NEUTROS PCT: 74.9 % (ref 43.0–77.0)
Platelets: 222 10*3/uL (ref 150.0–400.0)
RBC: 4.91 Mil/uL (ref 4.22–5.81)
RDW: 15.6 % — ABNORMAL HIGH (ref 11.5–15.5)
WBC: 9.1 10*3/uL (ref 4.0–10.5)

## 2018-11-11 LAB — PSA: PSA: 1 ng/mL (ref 0.10–4.00)

## 2018-11-11 MED ORDER — AMOXICILLIN-POT CLAVULANATE 875-125 MG PO TABS: 1.0000 | ORAL_TABLET | Freq: Two times a day (BID) | ORAL | 0 refills | Status: DC

## 2018-11-11 NOTE — Progress Notes (Signed)
Lucas Warren DOB: 1945-10-22 Encounter date: 11/11/2018  This is a 73 y.o. male who presents with Chief Complaint  Patient presents with  . Back Pain    left side, starts at hip and radiates down towards the groin area, pain described as soreness,     History of present illness:  Has had problems with this for at least a week. Thought it would go away, but hasn't. Worst in the morning when he first gets up.   Pain seems to improve when he props up feet. Today just got better after BM. Feeling left lower back and radiating around to left anterior groin.   Does come and go somewhat. Has noticed recently that he is urinating with less pressure.   Still going; still emptying bladder, just not same pressure. Has noted this in last few weeks. No fevers. Has a little discomfort with urination. No abdominal pain, more just groin pain. Has had some left groin discomfort since he hurt back (see below); same side he had hernia on several years ago.   Several months ago was unloading truck with heavy items. Had radiating pain down left leg. Straightened out after seeing chiropractor. Bothers him with a little soreness after standing on concrete for long periods.     Allergies  Allergen Reactions  . Lisinopril Cough  . Erythromycin     REACTION: abd pain  . Trulicity [Dulaglutide] Nausea Only   Current Meds  Medication Sig  . aspirin 81 MG tablet 1 tablet once per week  . glimepiride (AMARYL) 4 MG tablet TAKE 1 TABLET BY MOUTH ONCE DAILY WITH  BREAKFAST.  Marland Kitchen glucose blood (ONE TOUCH ULTRA TEST) test strip USE 1 STRIP TO CHECK GLUCOSE ONCE DAILY  . JARDIANCE 10 MG TABS tablet TAKE 1 TABLET BY MOUTH ONCE DAILY  . lisinopril (PRINIVIL,ZESTRIL) 10 MG tablet TAKE 1 TABLET BY MOUTH ONCE DAILY  . metFORMIN (GLUCOPHAGE) 500 MG tablet TAKE 2 TABLETS BY MOUTH TWICE DAILY  . ONE TOUCH LANCETS MISC Test glucose once daily . One touch mini fine.  Dx E11.65  . pioglitazone (ACTOS) 45 MG tablet TAKE 1  TABLET BY MOUTH ONCE DAILY  . pravastatin (PRAVACHOL) 20 MG tablet Take 1 tablet (20 mg total) by mouth daily.  . sildenafil (REVATIO) 20 MG tablet TAKE 2 TO 5 TABLETS BY MOUTH AS DIRECTED  . sitaGLIPtin (JANUVIA) 100 MG tablet Take 1 tablet (100 mg total) by mouth daily.  . vitamin B-12 (CYANOCOBALAMIN) 500 MCG tablet Take 500 mcg by mouth daily.    Review of Systems  Constitutional: Positive for activity change (slightly less active) and fatigue. Negative for fever.  HENT: Positive for congestion and postnasal drip.   Respiratory: Negative for cough, shortness of breath and wheezing.   Cardiovascular: Negative for chest pain.  Gastrointestinal: Negative for blood in stool, constipation (only goes every other day), diarrhea, nausea and vomiting.  Genitourinary: Positive for difficulty urinating, frequency (thinks might be related to jardiance) and urgency (this isn't new; this has been there for years). Negative for hematuria and scrotal swelling.       Little pain into scrotum    Objective:  BP (!) 108/58 (BP Location: Left Arm, Patient Position: Sitting, Cuff Size: Normal)   Pulse 68   Temp 97.7 F (36.5 C) (Oral)   Wt 214 lb 9.6 oz (97.3 kg)   SpO2 95%   BMI 29.10 kg/m   Weight: 214 lb 9.6 oz (97.3 kg)   BP Readings from Last  3 Encounters:  11/11/18 (!) 108/58  11/01/18 116/62  08/02/18 110/60   Wt Readings from Last 3 Encounters:  11/11/18 214 lb 9.6 oz (97.3 kg)  11/01/18 216 lb 9.6 oz (98.2 kg)  08/02/18 218 lb 8 oz (99.1 kg)    Physical Exam Constitutional:      General: He is not in acute distress.    Appearance: He is well-developed.  HENT:     Head: Normocephalic and atraumatic.  Cardiovascular:     Rate and Rhythm: Normal rate and regular rhythm.     Heart sounds: Normal heart sounds. No murmur. No friction rub.  Pulmonary:     Effort: Pulmonary effort is normal. No respiratory distress.     Breath sounds: Normal breath sounds. No wheezing or rales.   Abdominal:     General: Abdomen is flat. Bowel sounds are normal.     Palpations: Abdomen is soft.     Tenderness: Tenderness: very mild left sided tenderness. There is no right CVA tenderness, left CVA tenderness, guarding or rebound. Negative signs include Murphy's sign, McBurney's sign and psoas sign.     Hernia: No hernia is present. There is no hernia in the right inguinal area or left inguinal area.  Genitourinary:    Penis: Normal and circumcised.      Scrotum/Testes: Normal.     Epididymis:     Right: Normal.     Left: Normal.     Prostate: Enlarged (4-5). Not tender and no nodules present.     Rectum: Normal. Guaiac result negative. No mass or tenderness.     Comments: There is some erythema in gluteal cleft; appears to be residual erythema from intertriginous irritation. Musculoskeletal:     Right lower leg: No edema.     Left lower leg: No edema.     Comments: Full ROM of back without pain. No pain with extension or twisting. Negative straight leg raise. Minimal lateral left hip pain with internal rotation left hip, otherwise no decreased hip ROM or pain with manipulation.   Lymphadenopathy:     Lower Body: No right inguinal adenopathy. No left inguinal adenopathy.  Neurological:     Mental Status: He is alert and oriented to person, place, and time.     Deep Tendon Reflexes:     Reflex Scores:      Patellar reflexes are 2+ on the right side and 2+ on the left side.      Achilles reflexes are 2+ on the right side and 2+ on the left side. Psychiatric:        Behavior: Behavior normal.     Assessment/Plan  1. Frequency of urination Will get culture from urine.  - POC Urinalysis Dipstick - Culture, Urine - CBC with Differential/Platelet; Future - Comprehensive metabolic panel; Future - PSA  2. Left lower quadrant abdominal pain Continue to monitor symptoms. Keep up with water, fiber for regular stools. If worsening of discomfort, recommended starting augmentin for  possible diverticulitis. Sx are very mild presently with no current pain on exam. Will complete bloodwork today as well to help steer direction of treatment and will check in with him on sx pending result.    Return if symptoms worsen or fail to improve.    Micheline Rough, MD

## 2018-11-13 LAB — URINE CULTURE
MICRO NUMBER:: 317936
RESULT: NO GROWTH
SPECIMEN QUALITY:: ADEQUATE

## 2018-11-16 ENCOUNTER — Other Ambulatory Visit: Payer: Self-pay | Admitting: Family Medicine

## 2018-12-24 ENCOUNTER — Other Ambulatory Visit: Payer: Self-pay | Admitting: Family Medicine

## 2019-01-20 ENCOUNTER — Ambulatory Visit: Payer: Medicare Other

## 2019-01-24 ENCOUNTER — Ambulatory Visit: Payer: Medicare Other

## 2019-02-07 ENCOUNTER — Encounter: Payer: Self-pay | Admitting: Family Medicine

## 2019-02-07 ENCOUNTER — Other Ambulatory Visit: Payer: Self-pay

## 2019-02-07 ENCOUNTER — Ambulatory Visit (INDEPENDENT_AMBULATORY_CARE_PROVIDER_SITE_OTHER): Payer: Medicare Other | Admitting: Family Medicine

## 2019-02-07 VITALS — BP 120/64 | HR 65 | Temp 98.1°F | Wt 216.7 lb

## 2019-02-07 DIAGNOSIS — E1165 Type 2 diabetes mellitus with hyperglycemia: Secondary | ICD-10-CM

## 2019-02-07 DIAGNOSIS — E7800 Pure hypercholesterolemia, unspecified: Secondary | ICD-10-CM | POA: Diagnosis not present

## 2019-02-07 DIAGNOSIS — I10 Essential (primary) hypertension: Secondary | ICD-10-CM | POA: Diagnosis not present

## 2019-02-07 LAB — POCT GLYCOSYLATED HEMOGLOBIN (HGB A1C): Hemoglobin A1C: 8.1 % — AB (ref 4.0–5.6)

## 2019-02-07 MED ORDER — METFORMIN HCL 500 MG PO TABS: ORAL_TABLET | ORAL | 3 refills | Status: DC

## 2019-02-07 MED ORDER — PRAVASTATIN SODIUM 20 MG PO TABS: 20.0000 mg | ORAL_TABLET | Freq: Every day | ORAL | 3 refills | Status: DC

## 2019-02-07 NOTE — Progress Notes (Signed)
Subjective:     Patient ID: Lucas Warren, male   DOB: Jul 02, 1946, 73 y.o.   MRN: [id number]  HPI   Patient here for medical follow-up.  Type 2 diabetes.  Last A1c 7.8%.  Recent fasting blood sugars generally 100 to 120.  He takes several medications for his diabetes and has been compliant with all.  Previous intolerance with GLP-1 medication.  He has declined insulin multiple times previously.  He states his been slightly less compliant with diet during current pandemic.   Has not been exercising much.  Hyperlipidemia treated with pravastatin.  He denies any recent chest pains.  No significant myalgias.  Patient had some left groin pains when he was here in March.  He had several labs done then which were unremarkable.  Past Medical History:  Diagnosis Date  . Allergy    seasonal  . Anxiety   . Chest pain, atypical   . Colon polyps    hyperplastic 2003  . DJD (degenerative joint disease)   . DM (diabetes mellitus) ([place])   . GERD (gastroesophageal reflux disease)   . History of headache   . Hypercholesteremia   . Psoriasis    Past Surgical History:  Procedure Laterality Date  . CATARACT EXTRACTION, BILATERAL Bilateral    Dr. Tommy Warren;   . INGUINAL HERNIA REPAIR  1989  . NASAL SEPTUM SURGERY  1989    reports that he quit smoking about 37 years ago. His smoking use included cigarettes. He has a 40.00 pack-year smoking history. He has never used smokeless tobacco. He reports current alcohol use. He reports that he does not use drugs. family history includes Alzheimer's disease in his father; Heart disease in his father; Leukemia in his sister; Lung cancer in his mother. Allergies  Allergen Reactions  . Lisinopril Cough  . Erythromycin     REACTION: abd pain  . Trulicity [Dulaglutide] Nausea Only      Review of Systems  Constitutional: Negative for fatigue.  Eyes: Negative for visual disturbance.  Respiratory: Negative for cough, chest tightness and shortness of breath.    Cardiovascular: Negative for chest pain, palpitations and leg swelling.  Endocrine: Negative for polydipsia and polyuria.  Neurological: Negative for dizziness, syncope, weakness, light-headedness and headaches.       Objective:   Physical Exam Constitutional:      Appearance: He is well-developed.  HENT:     Right Ear: External ear normal.     Left Ear: External ear normal.  Eyes:     Pupils: Pupils are equal, round, and reactive to light.  Neck:     Musculoskeletal: Neck supple.     Thyroid: No thyromegaly.  Cardiovascular:     Rate and Rhythm: Normal rate and regular rhythm.  Pulmonary:     Effort: Pulmonary effort is normal. No respiratory distress.     Breath sounds: Normal breath sounds. No wheezing or rales.  Musculoskeletal:     Right lower leg: No edema.     Left lower leg: No edema.  Neurological:     Mental Status: He is alert and oriented to person, place, and time.        Assessment:     #1 type 2 diabetes suboptimally controlled with A1c 8.1%  #2 hypertension stable and at goal  #3 dyslipidemia treated with pravastatin.    Plan:     -Discussed diabetes in some detail.  We have again suggested consideration for long-acting insulin but he declines.  He would like to  give 3 more months of lifestyle management.  He has managed to bring this down to the 7 range in the past.  We have encouraged more consistent exercise and reducing sugars and starches.  Office follow-up in 3 months to reassess  -Refill metformin and pravastatin for 1 year  Lucas Post MD [facility] at [facility]

## 2019-03-21 ENCOUNTER — Ambulatory Visit: Payer: Medicare Other

## 2019-03-23 ENCOUNTER — Other Ambulatory Visit: Payer: Self-pay | Admitting: Family Medicine

## 2019-03-28 DIAGNOSIS — H5202 Hypermetropia, left eye: Secondary | ICD-10-CM | POA: Diagnosis not present

## 2019-03-28 DIAGNOSIS — E119 Type 2 diabetes mellitus without complications: Secondary | ICD-10-CM | POA: Diagnosis not present

## 2019-03-28 LAB — HM DIABETES EYE EXAM

## 2019-03-30 ENCOUNTER — Other Ambulatory Visit: Payer: Self-pay | Admitting: Family Medicine

## 2019-03-31 ENCOUNTER — Encounter: Payer: Self-pay | Admitting: Family Medicine

## 2019-03-31 NOTE — Progress Notes (Signed)
[facility]/thx dmf

## 2019-04-24 DIAGNOSIS — C44719 Basal cell carcinoma of skin of left lower limb, including hip: Secondary | ICD-10-CM | POA: Diagnosis not present

## 2019-04-24 DIAGNOSIS — L814 Other melanin hyperpigmentation: Secondary | ICD-10-CM | POA: Diagnosis not present

## 2019-04-24 DIAGNOSIS — D2271 Melanocytic nevi of right lower limb, including hip: Secondary | ICD-10-CM | POA: Diagnosis not present

## 2019-04-24 DIAGNOSIS — Z85828 Personal history of other malignant neoplasm of skin: Secondary | ICD-10-CM | POA: Diagnosis not present

## 2019-04-24 DIAGNOSIS — C44319 Basal cell carcinoma of skin of other parts of face: Secondary | ICD-10-CM | POA: Diagnosis not present

## 2019-04-24 DIAGNOSIS — D692 Other nonthrombocytopenic purpura: Secondary | ICD-10-CM | POA: Diagnosis not present

## 2019-04-24 DIAGNOSIS — C44712 Basal cell carcinoma of skin of right lower limb, including hip: Secondary | ICD-10-CM | POA: Diagnosis not present

## 2019-04-24 DIAGNOSIS — L821 Other seborrheic keratosis: Secondary | ICD-10-CM | POA: Diagnosis not present

## 2019-05-01 DIAGNOSIS — C44719 Basal cell carcinoma of skin of left lower limb, including hip: Secondary | ICD-10-CM | POA: Diagnosis not present

## 2019-05-01 DIAGNOSIS — C44319 Basal cell carcinoma of skin of other parts of face: Secondary | ICD-10-CM | POA: Diagnosis not present

## 2019-05-09 ENCOUNTER — Ambulatory Visit: Payer: Medicare Other | Admitting: Family Medicine

## 2019-05-09 ENCOUNTER — Encounter: Payer: Self-pay | Admitting: Family Medicine

## 2019-05-09 ENCOUNTER — Other Ambulatory Visit: Payer: Self-pay

## 2019-05-09 ENCOUNTER — Ambulatory Visit (INDEPENDENT_AMBULATORY_CARE_PROVIDER_SITE_OTHER): Payer: Medicare Other | Admitting: Family Medicine

## 2019-05-09 VITALS — BP 120/76 | HR 65 | Temp 97.8°F | Ht 72.0 in | Wt 222.3 lb

## 2019-05-09 DIAGNOSIS — Z23 Encounter for immunization: Secondary | ICD-10-CM

## 2019-05-09 DIAGNOSIS — E1165 Type 2 diabetes mellitus with hyperglycemia: Secondary | ICD-10-CM

## 2019-05-09 LAB — POCT GLYCOSYLATED HEMOGLOBIN (HGB A1C): Hemoglobin A1C: 7.5 % — AB (ref 4.0–5.6)

## 2019-05-09 NOTE — Patient Instructions (Signed)
Your A1C has improved to 7.5%  Keep up the good work!   Would like to see below 7  Try to step up your walking.

## 2019-05-09 NOTE — Progress Notes (Signed)
  Subjective:     Patient ID: Lucas Warren, male   DOB: [birth year], 73 y.o.   MRN: [id number]  HPI  Here for follow-up regarding diabetes.  Last A1c was 8.1%.  We had recommended consideration for long-acting insulin but he declined.  He remains on multiple medications.  Not monitoring blood sugars regularly.  Has been less active and has tried to reduce sweets but his weight is actually up slightly.  Needs flu vaccine.  Past Medical History:  Diagnosis Date  . Allergy    seasonal  . Anxiety   . Chest pain, atypical   . Colon polyps    hyperplastic 2003  . DJD (degenerative joint disease)   . DM (diabetes mellitus) ([place])   . GERD (gastroesophageal reflux disease)   . History of headache   . Hypercholesteremia   . Psoriasis    Past Surgical History:  Procedure Laterality Date  . CATARACT EXTRACTION, BILATERAL Bilateral    Dr. Tommy Rainwater;   . INGUINAL HERNIA REPAIR  1989  . NASAL SEPTUM SURGERY  1989    reports that he quit smoking about 37 years ago. His smoking use included cigarettes. He has a 40.00 pack-year smoking history. He has never used smokeless tobacco. He reports current alcohol use. He reports that he does not use drugs. family history includes Alzheimer's disease in his father; Heart disease in his father; Leukemia in his sister; Lung cancer in his mother. Allergies  Allergen Reactions  . Lisinopril Cough  . Erythromycin     REACTION: abd pain  . Trulicity [Dulaglutide] Nausea Only    Review of Systems  Constitutional: Negative for fatigue.  Eyes: Negative for visual disturbance.  Respiratory: Negative for cough, chest tightness and shortness of breath.   Cardiovascular: Negative for chest pain, palpitations and leg swelling.  Endocrine: Negative for polydipsia and polyuria.  Neurological: Negative for dizziness, syncope, weakness, light-headedness and headaches.       Objective:   Physical Exam Constitutional:      Appearance: He is well-developed.   HENT:     Right Ear: External ear normal.     Left Ear: External ear normal.  Eyes:     Pupils: Pupils are equal, round, and reactive to light.  Neck:     Musculoskeletal: Neck supple.     Thyroid: No thyromegaly.  Cardiovascular:     Rate and Rhythm: Normal rate and regular rhythm.  Pulmonary:     Effort: Pulmonary effort is normal. No respiratory distress.     Breath sounds: Normal breath sounds. No wheezing or rales.  Skin:    Comments: Feet reveal no skin lesions. Good distal foot pulses. Good capillary refill.  Does have small calluses bilaterally distal lateral aspect both feet but no ulceration. Normal sensation with monofilament testing   Neurological:     Mental Status: He is alert and oriented to person, place, and time.        Assessment:     Type 2 diabetes improved with A1c 7.5%    Plan:     -Continue dietary changes and recommend increasing physical activity -Flu vaccine given -34-month follow-up and recheck A1c then  [name] MD [facility] at [facility]

## 2019-05-16 ENCOUNTER — Other Ambulatory Visit: Payer: Self-pay | Admitting: Family Medicine

## 2019-05-16 ENCOUNTER — Ambulatory Visit: Payer: Medicare Other

## 2019-06-12 DIAGNOSIS — K219 Gastro-esophageal reflux disease without esophagitis: Secondary | ICD-10-CM | POA: Diagnosis not present

## 2019-06-12 DIAGNOSIS — J3089 Other allergic rhinitis: Secondary | ICD-10-CM | POA: Insufficient documentation

## 2019-06-25 ENCOUNTER — Other Ambulatory Visit: Payer: Self-pay | Admitting: Family Medicine

## 2019-06-29 ENCOUNTER — Other Ambulatory Visit: Payer: Self-pay | Admitting: Family Medicine

## 2019-07-21 DIAGNOSIS — S61211A Laceration without foreign body of left index finger without damage to nail, initial encounter: Secondary | ICD-10-CM | POA: Diagnosis not present

## 2019-07-28 DIAGNOSIS — Z5189 Encounter for other specified aftercare: Secondary | ICD-10-CM | POA: Diagnosis not present

## 2019-08-07 ENCOUNTER — Encounter: Payer: Self-pay | Admitting: Family Medicine

## 2019-08-07 ENCOUNTER — Ambulatory Visit (INDEPENDENT_AMBULATORY_CARE_PROVIDER_SITE_OTHER): Payer: Medicare Other | Admitting: Family Medicine

## 2019-08-07 ENCOUNTER — Other Ambulatory Visit: Payer: Self-pay

## 2019-08-07 VITALS — BP 128/78 | HR 69 | Temp 97.6°F | Ht 72.0 in | Wt 222.3 lb

## 2019-08-07 DIAGNOSIS — R1032 Left lower quadrant pain: Secondary | ICD-10-CM | POA: Diagnosis not present

## 2019-08-07 DIAGNOSIS — I10 Essential (primary) hypertension: Secondary | ICD-10-CM | POA: Diagnosis not present

## 2019-08-07 DIAGNOSIS — E1165 Type 2 diabetes mellitus with hyperglycemia: Secondary | ICD-10-CM

## 2019-08-07 DIAGNOSIS — E7800 Pure hypercholesterolemia, unspecified: Secondary | ICD-10-CM | POA: Diagnosis not present

## 2019-08-07 LAB — LIPID PANEL
Cholesterol: 197 mg/dL (ref 0–200)
HDL: 46.8 mg/dL (ref >39.00)
LDL Cholesterol: 119 mg/dL — ABNORMAL HIGH (ref 0–99)
NonHDL: 150.23
Total CHOL/HDL Ratio: 4
Triglycerides: 155 mg/dL — ABNORMAL HIGH (ref 0.0–149.0)
VLDL: 31 mg/dL (ref 0.0–40.0)

## 2019-08-07 LAB — POCT GLYCOSYLATED HEMOGLOBIN (HGB A1C): Hemoglobin A1C: 7.7 % — AB (ref 4.0–5.6)

## 2019-08-07 LAB — BASIC METABOLIC PANEL
BUN: 25 mg/dL — ABNORMAL HIGH (ref 6–23)
CO2: 24 mEq/L (ref 19–32)
Calcium: 9.5 mg/dL (ref 8.4–10.5)
Chloride: 104 mEq/L (ref 96–112)
Creatinine, Ser: 1.52 mg/dL — ABNORMAL HIGH (ref 0.40–1.50)
GFR: 45.36 mL/min — ABNORMAL LOW (ref >60.00)
Glucose, Bld: 106 mg/dL — ABNORMAL HIGH (ref 70–99)
Potassium: 4.9 mEq/L (ref 3.5–5.1)
Sodium: 137 mEq/L (ref 135–145)

## 2019-08-07 LAB — HEPATIC FUNCTION PANEL
ALT: 7 U/L (ref 0–53)
AST: 12 U/L (ref 0–37)
Albumin: 4.5 g/dL (ref 3.5–5.2)
Alkaline Phosphatase: 51 U/L (ref 39–117)
Bilirubin, Direct: 0.1 mg/dL (ref 0.0–0.3)
Total Bilirubin: 0.5 mg/dL (ref 0.2–1.2)
Total Protein: 7 g/dL (ref 6.0–8.3)

## 2019-08-07 MED ORDER — SITAGLIPTIN PHOSPHATE 100 MG PO TABS: 100.0000 mg | ORAL_TABLET | Freq: Every day | ORAL | 3 refills | Status: DC

## 2019-08-07 MED ORDER — GLIMEPIRIDE 4 MG PO TABS: ORAL_TABLET | ORAL | 3 refills | Status: DC

## 2019-08-07 MED ORDER — PIOGLITAZONE HCL 45 MG PO TABS: 45.0000 mg | ORAL_TABLET | Freq: Every day | ORAL | 3 refills | Status: DC

## 2019-08-07 NOTE — Progress Notes (Signed)
Subjective:     Patient ID: Lucas Warren, male   DOB: 08-14-1946, 73 y.o.   MRN: [id number]  HPI Lucas Warren is seen for medical follow-up.  Regarding his diabetes he states his fasting blood sugars been very good.  He states he had fasting this morning 71 usually in the 90s.  No hypoglycemia.  Not checking blood sugars later in the day.  No formal exercise.Marland Kitchen  He is on multiple diabetes drugs and needs refills of several including Actos, Januvia, and glimepiride.  He has been very reluctant to take insulin in the past.  He has hypertension treated with lisinopril.  No recent dizziness.  No cough.  Compliant with therapy.  He has hyperlipidemia treated with pravastatin.  Overdue for lipids  He has burning sensation left inguinal area.  He states he had hernia repair there many years ago.  Burning usually last for a few days then resolves.  No stool changes.  No adenopathy.  No rashes.  No bulging.  Weight stable compared with last visit.  Wt Readings from Last 3 Encounters:  08/07/19 222 lb 4.8 oz (100.8 kg)  05/09/19 222 lb 4.8 oz (100.8 kg)  02/07/19 216 lb 11.2 oz (98.3 kg)     Past Medical History:  Diagnosis Date  . Allergy    seasonal  . Anxiety   . Chest pain, atypical   . Colon polyps    hyperplastic 2003  . DJD (degenerative joint disease)   . DM (diabetes mellitus) ([place])   . GERD (gastroesophageal reflux disease)   . History of headache   . Hypercholesteremia   . Psoriasis    Past Surgical History:  Procedure Laterality Date  . CATARACT EXTRACTION, BILATERAL Bilateral    Dr. Tommy Rainwater;   . INGUINAL HERNIA REPAIR  1989  . NASAL SEPTUM SURGERY  1989    reports that he quit smoking about 37 years ago. His smoking use included cigarettes. He has a 40.00 pack-year smoking history. He has never used smokeless tobacco. He reports current alcohol use. He reports that he does not use drugs. family history includes Alzheimer's disease in his father; Heart disease in his father;  Leukemia in his sister; Lung cancer in his mother. Allergies  Allergen Reactions  . Lisinopril Cough  . Erythromycin     REACTION: abd pain  . Trulicity [Dulaglutide] Nausea Only     Review of Systems  Constitutional: Negative for appetite change, chills, fatigue, fever and unexpected weight change.  Eyes: Negative for visual disturbance.  Respiratory: Negative for cough, chest tightness and shortness of breath.   Cardiovascular: Negative for chest pain, palpitations and leg swelling.  Gastrointestinal: Negative for abdominal pain, blood in stool, constipation and diarrhea.  Genitourinary: Negative for dysuria.  Neurological: Negative for dizziness, syncope, weakness, light-headedness and headaches.       Objective:   Physical Exam Vitals signs reviewed.  Constitutional:      Appearance: Normal appearance.  Cardiovascular:     Rate and Rhythm: Normal rate and regular rhythm.  Pulmonary:     Effort: Pulmonary effort is normal.     Breath sounds: Normal breath sounds.  Genitourinary:    Comments: Left inguinal exam reveals no recurrent hernia No adenopathy. Musculoskeletal:     Right lower leg: No edema.     Left lower leg: No edema.  Neurological:     Mental Status: He is alert.        Assessment:     #1 type 2 diabetes.  A1c today 7.7%.  Patient reluctant to consider additional medication for treatment and tighter control  #2 hypertension controlled  #3 hyperlipidemia treated with pravastatin and overdue for lipid panel  #4 left inguinal burning sensation.  Suspect related to scar tissue from remote hernia repair.  No adenopathy or any evidence for recurrent hernia    Plan:     -We recommend try to step up his exercise such as walking -We again had a long discussion regarding diabetic treatment options.  He is reluctant to consider insulin or any additional medications at this point.  Will recommend 23-month follow-up.  Continue with regular eye exams -Check  labs with lipid, hepatic, basic metabolic panel -Flu vaccine already given  [name] MD [facility] at [facility]

## 2019-08-08 ENCOUNTER — Other Ambulatory Visit: Payer: Self-pay

## 2019-08-08 DIAGNOSIS — E785 Hyperlipidemia, unspecified: Secondary | ICD-10-CM

## 2019-08-08 MED ORDER — PRAVASTATIN SODIUM 40 MG PO TABS: 40.0000 mg | ORAL_TABLET | Freq: Every day | ORAL | 3 refills | Status: DC

## 2019-08-11 ENCOUNTER — Telehealth: Payer: Self-pay | Admitting: *Deleted

## 2019-08-11 NOTE — Telephone Encounter (Signed)
Copied from [employer] [id number]. Topic: Quick Communication - See Telephone Encounter >> Aug 11, 2019  1:12 PM Blase Mess A wrote: CRM for notification. See Telephone encounter for: 08/11/19.  Patient is calling to speak to [facility]. Patient states Meaghan gave him some instructions. And he did not remember what those where. Please advise [phone]

## 2019-08-11 NOTE — Telephone Encounter (Signed)
Called patient and LMOVM to return call  [place] for [facility] to Discuss results / PCP / recommendations / Schedule patient  Per previous message: Called patient and gave him lab results. Patient verbalized an understanding. Patient stated that he has been taking the Pravastatin 20 mg daily. New Rx for Pravastatin 40 mg has been sent to his pharmacy. Lab appointment made on 08/15/19 and lab ordered.  CRM Created.

## 2019-08-11 NOTE — Telephone Encounter (Signed)
Patient called to confirm recommendations given by [facility] concerning lab medications.Confirmed recommendations, patient verbalized understanding.

## 2019-08-14 ENCOUNTER — Other Ambulatory Visit: Payer: Self-pay

## 2019-08-15 ENCOUNTER — Other Ambulatory Visit (INDEPENDENT_AMBULATORY_CARE_PROVIDER_SITE_OTHER): Payer: Medicare Other

## 2019-08-15 DIAGNOSIS — E785 Hyperlipidemia, unspecified: Secondary | ICD-10-CM | POA: Diagnosis not present

## 2019-08-15 LAB — BASIC METABOLIC PANEL
BUN: 28 mg/dL — ABNORMAL HIGH (ref 6–23)
CO2: 22 mEq/L (ref 19–32)
Calcium: 9.1 mg/dL (ref 8.4–10.5)
Chloride: 107 mEq/L (ref 96–112)
Creatinine, Ser: 1.61 mg/dL — ABNORMAL HIGH (ref 0.40–1.50)
GFR: 42.45 mL/min — ABNORMAL LOW (ref >60.00)
Glucose, Bld: 109 mg/dL — ABNORMAL HIGH (ref 70–99)
Potassium: 4.2 mEq/L (ref 3.5–5.1)
Sodium: 138 mEq/L (ref 135–145)

## 2019-09-25 ENCOUNTER — Other Ambulatory Visit: Payer: Self-pay | Admitting: Family Medicine

## 2019-10-06 ENCOUNTER — Other Ambulatory Visit: Payer: Self-pay | Admitting: Family Medicine

## 2019-10-13 ENCOUNTER — Other Ambulatory Visit: Payer: Self-pay | Admitting: Family Medicine

## 2019-10-22 ENCOUNTER — Ambulatory Visit: Payer: Medicare Other | Attending: Internal Medicine

## 2019-10-22 DIAGNOSIS — Z23 Encounter for immunization: Secondary | ICD-10-CM

## 2019-10-22 NOTE — Progress Notes (Signed)
   Covid-19 Vaccination Clinic  Name:  Lucas Warren    MRN: [id number] DOB: 10-05-1945  10/22/2019  Lucas Warren was observed post Covid-19 immunization for 15 minutes without incidence. He was provided with Vaccine Information Sheet and instruction to access the V-Safe system.   Lucas Warren was instructed to call 911 with any severe reactions post vaccine: Marland Kitchen Difficulty breathing  . Swelling of your face and throat  . A fast heartbeat  . A bad rash all over your body  . Dizziness and weakness    Immunizations Administered    Name Date Dose VIS Date Route   Pfizer COVID-19 Vaccine 10/22/2019 10:37 AM 0.3 mL 08/11/2019 Intramuscular   Manufacturer: [place]   Lot: [id number]   [place]: [id number]

## 2019-11-06 ENCOUNTER — Ambulatory Visit (INDEPENDENT_AMBULATORY_CARE_PROVIDER_SITE_OTHER): Payer: Medicare Other | Admitting: Family Medicine

## 2019-11-06 ENCOUNTER — Encounter: Payer: Self-pay | Admitting: Family Medicine

## 2019-11-06 ENCOUNTER — Other Ambulatory Visit: Payer: Self-pay

## 2019-11-06 VITALS — BP 118/70 | HR 59 | Temp 97.6°F | Ht 72.0 in | Wt 224.8 lb

## 2019-11-06 DIAGNOSIS — E7800 Pure hypercholesterolemia, unspecified: Secondary | ICD-10-CM

## 2019-11-06 DIAGNOSIS — N183 Chronic kidney disease, stage 3 unspecified: Secondary | ICD-10-CM | POA: Insufficient documentation

## 2019-11-06 DIAGNOSIS — I10 Essential (primary) hypertension: Secondary | ICD-10-CM | POA: Diagnosis not present

## 2019-11-06 DIAGNOSIS — E1165 Type 2 diabetes mellitus with hyperglycemia: Secondary | ICD-10-CM | POA: Diagnosis not present

## 2019-11-06 LAB — POCT GLYCOSYLATED HEMOGLOBIN (HGB A1C): Hemoglobin A1C: 7.2 % — AB (ref 4.0–5.6)

## 2019-11-06 NOTE — Progress Notes (Signed)
Subjective:     Patient ID: Lucas Warren, male   DOB: 1946/06/04, 74 y.o.   MRN: [id number]  HPI [name] is seen for medical follow-up.  He has type 2 diabetes which is been suboptimally controlled though he has seen some improvement with home fasting blood sugars over the past few months.  He has been reducing portion sizes predominantly.  He states his fasting blood sugars have consistently been between the 80s and about 110.  He plans to do more walking soon but has been fairly sedentary over the winter.  Weight is about the same.  Hypertension which is treated with lisinopril.  No recent dizziness.  No cough.  No chest pains.  He has hyperlipidemia treated with pravastatin.  Lipids from back in December reviewed.  He has chronic kidney disease.  No regular nonsteroidal use.  No recent peripheral edema.  Past Medical History:  Diagnosis Date  . Allergy    seasonal  . Anxiety   . Chest pain, atypical   . Colon polyps    hyperplastic 2003  . DJD (degenerative joint disease)   . DM (diabetes mellitus) ([place])   . GERD (gastroesophageal reflux disease)   . History of headache   . Hypercholesteremia   . Psoriasis    Past Surgical History:  Procedure Laterality Date  . CATARACT EXTRACTION, BILATERAL Bilateral    Dr. Tommy Rainwater;   . INGUINAL HERNIA REPAIR  1989  . NASAL SEPTUM SURGERY  1989    reports that he quit smoking about 38 years ago. His smoking use included cigarettes. He has a 40.00 pack-year smoking history. He has never used smokeless tobacco. He reports current alcohol use. He reports that he does not use drugs. family history includes Alzheimer's disease in his father; Heart disease in his father; Leukemia in his sister; Lung cancer in his mother. Allergies  Allergen Reactions  . Lisinopril Cough  . Erythromycin     REACTION: abd pain  . Trulicity [Dulaglutide] Nausea Only     Review of Systems  Constitutional: Negative for fatigue and unexpected weight change.   Eyes: Negative for visual disturbance.  Respiratory: Negative for cough, chest tightness and shortness of breath.   Cardiovascular: Negative for chest pain, palpitations and leg swelling.  Neurological: Negative for dizziness, syncope, weakness, light-headedness and headaches.       Objective:   Physical Exam Constitutional:      Appearance: He is well-developed.  HENT:     Right Ear: External ear normal.     Left Ear: External ear normal.  Eyes:     Pupils: Pupils are equal, round, and reactive to light.  Neck:     Thyroid: No thyromegaly.  Cardiovascular:     Rate and Rhythm: Normal rate and regular rhythm.  Pulmonary:     Effort: Pulmonary effort is normal. No respiratory distress.     Breath sounds: Normal breath sounds. No wheezing or rales.  Musculoskeletal:     Cervical back: Neck supple.  Neurological:     Mental Status: He is alert and oriented to person, place, and time.        Assessment:     #1 type 2 diabetes improving control with A1c 7.2%  #2 hypertension stable and at goal  #3 dyslipidemia.  Treated with pravastatin.  No adverse side effects.  #4 chronic kidney disease stable by recent labs    Plan:     -Continue current medications -He is encouraged to step up his exercise and  try to lose some weight.  We will plan 46-month follow-up.  We elected not to add additional diabetes medication at this point with his recent improvements in A1c -Recheck basic metabolic panel at follow-up  [name] MD [facility] at [facility]

## 2019-11-06 NOTE — Patient Instructions (Signed)
Increase you walking and exercise    A1C is improved to 7.2%.

## 2019-11-15 ENCOUNTER — Ambulatory Visit: Payer: Medicare Other | Attending: Internal Medicine

## 2019-11-15 DIAGNOSIS — Z23 Encounter for immunization: Secondary | ICD-10-CM

## 2019-11-15 NOTE — Progress Notes (Signed)
   Covid-19 Vaccination Clinic  Name:  Lucas Warren    MRN: [id number] DOB: 11-Sep-1945  11/15/2019  [name] was observed post Covid-19 immunization for 15 minutes without incident. He was provided with Vaccine Information Sheet and instruction to access the V-Safe system.   [name] was instructed to call 911 with any severe reactions post vaccine: Marland Kitchen Difficulty breathing  . Swelling of face and throat  . A fast heartbeat  . A bad rash all over body  . Dizziness and weakness   Immunizations Administered    Name Date Dose VIS Date Route   Pfizer COVID-19 Vaccine 11/15/2019  1:06 PM 0.3 mL 08/11/2019 Intramuscular   Manufacturer: [place]   Lot: [id number]   [place]: [id number]

## 2019-12-26 ENCOUNTER — Other Ambulatory Visit: Payer: Self-pay | Admitting: Family Medicine

## 2020-01-05 ENCOUNTER — Telehealth: Payer: Self-pay | Admitting: Family Medicine

## 2020-01-05 NOTE — Chronic Care Management (AMB) (Signed)
  Chronic Care Management   Note  01/05/2020 Name: AAKARSH CUTTER MRN: [id number] DOB: 05/04/1946  SASCHA BONDER is a 74 y.o. year old male who is a primary care patient of Burchette, Alinda Sierras, MD. I reached out to [name] by phone today in response to a referral sent by Mr. Monica Becton Doner's PCP, [name], MD.   Mr. Parro was given information about Chronic Care Management services today including:  1. CCM service includes personalized support from designated clinical staff supervised by his physician, including individualized plan of care and coordination with other care providers 2. 24/7 contact phone numbers for assistance for urgent and routine care needs. 3. Service will only be billed when office clinical staff spend 20 minutes or more in a month to coordinate care. 4. Only one practitioner may furnish and bill the service in a calendar month. 5. The patient may stop CCM services at any time (effective at the end of the month) by phone call to the office staff.   Patient agreed to services and verbal consent obtained.   Follow up plan:    Chronic Care Management   Note  01/05/2020 Name: COLM HERPEL MRN: [id number] DOB: 03/11/1946  BROLY KLEPINGER is a 74 y.o. year old male who is a primary care patient of Burchette, Alinda Sierras, MD. I reached out to [name] by phone today in response to a referral sent by Mr. Monica Becton Forlenza's PCP, [name], MD.   Mr. Osher was given information about Chronic Care Management services today including:  6. CCM service includes personalized support from designated clinical staff supervised by his physician, including individualized plan of care and coordination with other care providers 7. 24/7 contact phone numbers for assistance for urgent and routine care needs. 8. Service will only be billed when office clinical staff spend 20 minutes or more in a month to coordinate care. 9. Only one practitioner may furnish  and bill the service in a calendar month. 10. The patient may stop CCM services at any time (effective at the end of the month) by phone call to the office staff.   Patient agreed to services and verbal consent obtained.   Follow up plan:   [place]

## 2020-01-15 ENCOUNTER — Other Ambulatory Visit: Payer: Self-pay | Admitting: Family Medicine

## 2020-01-27 ENCOUNTER — Other Ambulatory Visit: Payer: Self-pay | Admitting: Family Medicine

## 2020-02-08 ENCOUNTER — Ambulatory Visit: Payer: Medicare Other

## 2020-02-08 ENCOUNTER — Other Ambulatory Visit: Payer: Self-pay

## 2020-02-08 DIAGNOSIS — E1165 Type 2 diabetes mellitus with hyperglycemia: Secondary | ICD-10-CM

## 2020-02-08 DIAGNOSIS — I10 Essential (primary) hypertension: Secondary | ICD-10-CM

## 2020-02-08 DIAGNOSIS — E7800 Pure hypercholesterolemia, unspecified: Secondary | ICD-10-CM

## 2020-02-08 DIAGNOSIS — N183 Chronic kidney disease, stage 3 unspecified: Secondary | ICD-10-CM

## 2020-02-08 NOTE — Chronic Care Management (AMB) (Signed)
Chronic Care Management Pharmacy  Name: Lucas Warren  MRN: [id number] DOB: 06/11/1946  Initial Questions: 1. Have you seen any other providers since your last visit? NA 2. Any changes in your medicines or health? No   Chief Complaint/ HPI  Lucas Warren,  74 y.o. , male presents for their Initial CCM visit with the clinical pharmacist via telephone.  PCP : [name], MD  Their chronic conditions include: DM, HTN, HLD, CKD stage 3  Office Visits: 11/06/2019- [name], MD- Patient presented for office visit for follow up. Patient to continue current medications. Patient was encouraged to step up exercise and aim to lose weight. Plan for 4 month follow up. No additional DM medications added due to A1c improvement.   08/07/2019- [name], MD- Patient presented for office visit for follow up. Patient needing refills for DM medications. Patient reluctant to start additional DM medications. Patient to step up exercise such as walking. Recommend 3 month follow up. Labs to be checked: lipid, hepatic, BMET.   Medications: Outpatient Encounter Medications as of 02/08/2020  Medication Sig  . aspirin 81 MG tablet 1 tablet twice per week  . glimepiride (AMARYL) 4 MG tablet Take 1 tablet by mouth once daily with breakfast  . JARDIANCE 10 MG TABS tablet Take 1 tablet by mouth once daily  . Lancets (ONETOUCH DELICA PLUS [id number]) MISC USE 1  TO CHECK GLUCOSE ONCE DAILY  . lisinopril (ZESTRIL) 10 MG tablet Take 1 tablet by mouth once daily  . metFORMIN (GLUCOPHAGE) 500 MG tablet TAKE 2 TABLETS BY MOUTH TWICE DAILY  . ONETOUCH ULTRA test strip USE 1 STRIP TO CHECK GLUCOSE ONCE DAILY  . pioglitazone (ACTOS) 45 MG tablet Take 1 tablet (45 mg total) by mouth daily.  . pravastatin (PRAVACHOL) 40 MG tablet Take 1 tablet (40 mg total) by mouth daily.  . sildenafil (REVATIO) 20 MG tablet TAKE 2 TO 5 TABLETS BY MOUTH AS DIRECTED  . sitaGLIPtin (JANUVIA) 100 MG tablet Take 1 tablet  (100 mg total) by mouth daily.  . vitamin B-12 (CYANOCOBALAMIN) 1000 MCG tablet Take 1,000 mcg by mouth daily.    No facility-administered encounter medications on file as of 02/08/2020.     Current Diagnosis/Assessment:  Goals Addressed            This Visit's Progress   . Pharmacy Care Plan       CARE PLAN ENTRY  Current Barriers:  . Chronic Disease Management support, education, and care coordination needs related to Hypertension, Hyperlipidemia, Diabetes, and Chronic Kidney Disease   Hypertension . Pharmacist Clinical Goal(s): o Over the next 90 days, patient will work with PharmD and providers to maintain BP goal <130/80 . Current regimen:  o Lisinopril 10mg , 1 tablet once daily . Interventions: o We discussed fall risk precautions and taking time before taking first step after standing up. . Patient self care activities - Over the next 90 days, patient will: o Check BP as directed, document, and provide at future appointments o Ensure daily salt intake < 2300 mg/day  Hyperlipidemia . Pharmacist Clinical Goal(s): o Over the next 90 days, patient will work with PharmD and providers to achieve LDL goal < 100 . Current regimen:  o Pravastatin 40mg , 1 tablet once daily . Patient self care activities - Over the next 90 days, patient will: o Continue current medications.   Diabetes . Pharmacist Clinical Goal(s): o Over the next 90 days, patient will work with PharmD and providers to achieve  A1c goal <7% . Current regimen:   Glimepiride 4mg , 1 tablet once daily with breakfast  Jardiance 10mg , 1 tablet once daily  Metformin 500mg , 2 tablets twice daily  Pioglitazone (Actos) 45mg , 1 tablet once daily  sitagliptin (Januvia) 100mg , 1 tablet once daily . Interventions: o We discussed alternating times when blood sugars are checking in the day.  . Patient self care activities - Over the next 90 days, patient will: o Check blood sugar once daily, document, and provide  at future appointments o Contact provider with any episodes of hypoglycemia  Chronic kidney disease . Pharmacist Clinical Goal(s) o Over the next 90 days, patient will work with PharmD and providers to maintain kidney function.  . Patient self care activities o Patient will continue to maintain proper hydration and avoid medications like NSAIDs (ibuprofen, naproxen, etc.).   Medication management . Pharmacist Clinical Goal(s): o Over the next 90 days, patient will work with PharmD and providers to maintain optimal medication adherence . Current pharmacy: [facility] . Interventions o Comprehensive medication review performed. o Continue current medication management strategy . Patient self care activities - Over the next 90 days, patient will: o Take medications as prescribed o Report any questions or concerns to PharmD and/or provider(s)  Initial goal documentation       SDOH Interventions     Most Recent Value  SDOH Interventions  Financial Strain Interventions Other (Comment)  [Patient does not qualify for ExtraHelp. Sending patient assistance application for Januvia and Jardiance.]  Transportation Interventions Intervention Not Indicated       Diabetes   Recent Relevant Labs: Lab Results  Component Value Date/Time   HGBA1C 7.2 (A) 11/06/2019 08:33 AM   HGBA1C 7.7 (A) 08/07/2019 08:33 AM   HGBA1C 8.3 (H) 12/25/2015 08:39 AM   HGBA1C 8.4 10/14/2015 12:00 AM   HGBA1C 7.3 (H) 05/22/2015 09:53 AM   MICROALBUR 4.3 (H) 01/10/2018 08:54 AM   MICROALBUR 2.1 (H) 02/02/2014 09:40 AM    Checking BG: Daily  Recent FBG Readings: 85, 94, 107, 110, 123, 99 117, 75 97, 111, 104, 114, 93, 133 (had dessert)   Recent PPBG readings: 210 mg/dl (patient unsure if it was 2 hours post meal)   Patient has failed these meds in past: Trulicity (nausea), Onglyza (insurance formulary)    Patient is currently uncontrolled on the following medications:   Glimepiride 4mg , 1 tablet once  daily with breakfast  Jardiance 10mg , 1 tablet once daily  Metformin 500mg , 2 tablets twice daily  Pioglitazone (Actos) 45mg , 1 tablet once daily  sitagliptin (Januvia) 100mg , 1 tablet once daily    Last diabetic Eye exam:  Lab Results  Component Value Date/Time   HMDIABEYEEXA No Retinopathy 02/28/2018 12:00 AM     Last diabetic Foot exam:  Lab Results  Component Value Date/Time   HMDIABFOOTEX normal 03/25/2016 12:00 AM     We discussed: diet and exercise extensively and how to recognize and treat signs of hypoglycemia Breakfast - 2 [employer] pancakes - Honey Bun (small bun)  Lunch Crackers (peanut butter, wafer)- eats a pack for lunch.  Dinner (5-6pm)  Salad, black olives, ranch dressing or 5 wings and 5 onion rings or  [employer]- black forest ham; oatmeal cookie    Plan Continue current medications  Recommend checking BGs daily but alternating time of day since FBG are within target but unclear how BGs are doing later in the day (questionable due to reported food options)    Hypertension  Denies persistent dizziness/lightheadedness.  Endorses orthostatic hypotension- but not persistent.   Office blood pressures are  BP Readings from Last 3 Encounters:  11/06/19 118/70  08/07/19 128/78  05/09/19 120/76   Patient has failed these meds in the past: none  Patient checks BP at home does not check at home   Patient home BP readings are ranging: NA  Patient is controlled on:   Lisinopril 10mg , 1 tablet once daily  We discussed fall risk precautions and taking time before taking first step after standing up.  Plan Continue current medications   Hyperlipidemia   Lipid Panel     Component Value Date/Time   CHOL 197 08/07/2019 0848   TRIG 155.0 (H) 08/07/2019 0848   HDL 46.80 08/07/2019 0848   LDLCALC 119 (H) 08/07/2019 0848    The 10-year ASCVD risk score Mikey Bussing DC Jr., et al., 2013) is: 36.9%   Values used to calculate the score:     Age: 55 years      Sex: Male     Is Non-Hispanic African American: No     Diabetic: Yes     Tobacco smoker: No     Systolic Blood Pressure: 416 mmHg     Is BP treated: Yes     HDL Cholesterol: 46.8 mg/dL     Total Cholesterol: 197 mg/dL   Patient has failed these meds in past: rosuvastatin (elevated BGs), simvastatin (myalgia)  Patient is currently uncontrolled on the following medications:  . Pravastatin 40mg , 1 tablet once daily  We discussed:  diet and exercise extensively (see above).   Plan Continue current medications  Primary prevention ASCVD   Patient is currently controlled on the following medications:   Aspirin 81mg , 1 tablet every 2 weeks   Plan Continue current medications  OTC/ supplements  Patient is currently on the following medications:  Marland Kitchen Vitamin B12 1010mcg, 1 tablet once daily    Plan Continue current medications   CKD, Stage 3   Kidney Function Lab Results  Component Value Date/Time   CREATININE 1.61 (H) 08/15/2019 07:32 AM   CREATININE 1.52 (H) 08/07/2019 08:48 AM   GFR 42.45 (L) 08/15/2019 07:32 AM   GFRNONAA 95.70 04/04/2010 09:56 AM   GFRAA 98 10/03/2008 11:01 AM    Plan Continue to monitor and adjust medications as needed.   - consider dose reduction of Januvia if renal function decreases   Medication Management  Patient organizes medications: patient has own strategy.  Separates meds by AM and PM.  Primary pharmacy: [facility] (goes to drive through)  Adherence: none     Follow up Follow up visit with PharmD in 1 month to review PAP if needed additional assistance (for Januvia and Jardiance).     Anson Crofts, PharmD Clinical Pharmacist [facility] [place] [phone]

## 2020-02-09 ENCOUNTER — Other Ambulatory Visit: Payer: Self-pay

## 2020-02-09 DIAGNOSIS — I10 Essential (primary) hypertension: Secondary | ICD-10-CM

## 2020-02-09 DIAGNOSIS — E1165 Type 2 diabetes mellitus with hyperglycemia: Secondary | ICD-10-CM

## 2020-02-12 NOTE — Patient Instructions (Addendum)
Visit Information  Goals Addressed            This Visit's Progress   . Pharmacy Care Plan       CARE PLAN ENTRY  Current Barriers:  . Chronic Disease Management support, education, and care coordination needs related to Hypertension, Hyperlipidemia, Diabetes, and Chronic Kidney Disease   Hypertension . Pharmacist Clinical Goal(s): o Over the next 90 days, patient will work with PharmD and providers to maintain BP goal <130/80 . Current regimen:  o Lisinopril 10mg , 1 tablet once daily . Interventions: o We discussed fall risk precautions and taking time before taking first step after standing up. . Patient self care activities - Over the next 90 days, patient will: o Check BP as directed, document, and provide at future appointments o Ensure daily salt intake < 2300 mg/day  Hyperlipidemia . Pharmacist Clinical Goal(s): o Over the next 90 days, patient will work with PharmD and providers to achieve LDL goal < 100 . Current regimen:  o Pravastatin 40mg , 1 tablet once daily . Patient self care activities - Over the next 90 days, patient will: o Continue current medications.   Diabetes . Pharmacist Clinical Goal(s): o Over the next 90 days, patient will work with PharmD and providers to achieve A1c goal <7% . Current regimen:   Glimepiride 4mg , 1 tablet once daily with breakfast  Jardiance 10mg , 1 tablet once daily  Metformin 500mg , 2 tablets twice daily  Pioglitazone (Actos) 45mg , 1 tablet once daily  sitagliptin (Januvia) 100mg , 1 tablet once daily . Interventions: o We discussed alternating times when blood sugars are checking in the day.  . Patient self care activities - Over the next 90 days, patient will: o Check blood sugar once daily, document, and provide at future appointments o Contact provider with any episodes of hypoglycemia  Chronic kidney disease . Pharmacist Clinical Goal(s) o Over the next 90 days, patient will work with PharmD and providers to  maintain kidney function.  . Patient self care activities o Patient will continue to maintain proper hydration and avoid medications like NSAIDs (ibuprofen, naproxen, etc.).   Medication management . Pharmacist Clinical Goal(s): o Over the next 90 days, patient will work with PharmD and providers to maintain optimal medication adherence . Current pharmacy: [facility] . Interventions o Comprehensive medication review performed. o Continue current medication management strategy . Patient self care activities - Over the next 90 days, patient will: o Take medications as prescribed o Report any questions or concerns to PharmD and/or provider(s)  Initial goal documentation        Mr. Waggle was given information about Chronic Care Management services today including:  1. CCM service includes personalized support from designated clinical staff supervised by his physician, including individualized plan of care and coordination with other care providers 2. 24/7 contact phone numbers for assistance for urgent and routine care needs. 3. Standard insurance, coinsurance, copays and deductibles apply for chronic care management only during months in which we provide at least 20 minutes of these services. Most insurances cover these services at 100%, however patients may be responsible for any copay, coinsurance and/or deductible if applicable. This service may help you avoid the need for more expensive face-to-face services. 4. Only one practitioner may furnish and bill the service in a calendar month. 5. The patient may stop CCM services at any time (effective at the end of the month) by phone call to the office staff.  Patient agreed to services and verbal consent obtained.  The patient verbalized understanding of instructions provided today and agreed to receive a mailed copy of patient instruction and/or educational materials. Telephone follow up appointment with pharmacy team member scheduled for:  03/15/2020  Anson Crofts, PharmD Clinical Pharmacist [facility] [place] [phone]   Food Basics for Chronic Kidney Disease When your kidneys are not working well, they cannot remove waste and excess substances from your blood as effectively as they did before. This can lead to a buildup and imbalance of these substances, which can worsen kidney damage and affect how your body functions. Certain foods lead to a buildup of these substances in the body. By changing your diet as recommended by your diet and nutrition specialist (dietitian) or health care provider, you could help prevent further kidney damage and delay or prevent the need for dialysis. What are tips for following this plan? General instructions   Work with your health care provider and dietitian to develop a meal plan that is right for you. Foods you can eat, limit, or avoid will be different for each person depending on the stage of kidney disease and any other existing health conditions.  Talk with your health care provider about whether you should take a vitamin and mineral supplement.  Use standard measuring cups and spoons to measure servings of foods. Use a kitchen scale to measure portions of protein foods.  If directed by your health care provider, avoid drinking too much fluid. Measure and count all liquids, including water, ice, soups, flavored gelatin, and frozen desserts such as popsicles or ice cream. Reading food labels  Check the amount of sodium in foods. Choose foods that have less than 300 milligrams (mg) per serving.  Check the ingredient list for phosphorus or potassium-based additives or preservatives.  Check the amount of saturated and trans fat. Limit or avoid these fats as told by your dietitian. Shopping  Avoid buying foods that are: ? Processed, frozen, or prepackaged. ? Calcium-enriched or fortified.  Do not buy foods that have salt or sodium listed among the first  five ingredients.  Do not buy canned vegetables. Cooking  Replace animal proteins, such as meat, fish, eggs, or dairy, with plant proteins from beans, nuts, and soy. ? Use soy milk instead of cow's milk. ? Add beans or tofu to soups, casseroles, or pasta dishes instead of meat.  Soak vegetables, such as potatoes, before cooking to reduce potassium. To do this: ? Peel and cut into small pieces. ? Soak in warm water for at least 2 hours. For every 1 cup of vegetables, use 10 cups of water. ? Drain and rinse with warm water. ? Boil for at least 5 minutes. Meal planning  Limit the amount of protein from plant and animal sources you eat each day.  Do not add salt to food when cooking or before eating.  Eat meals and snacks at around the same time each day. If you have diabetes:  If you have diabetes (diabetes mellitus) and chronic kidney disease, it is important to keep your blood glucose in the target range recommended by your health care provider. Follow your diabetes management plan. This may include: ? Checking your blood glucose regularly. ? Taking oral medicines, insulin, or both. ? Exercising for at least 30 minutes on 5 or more days each week, or as told by your health care provider. ? Tracking how many servings of carbohydrates you eat at each meal.  You may be given specific guidelines on how much  of certain foods and nutrients you may eat, depending on your stage of kidney disease and whether you have high blood pressure (hypertension). Follow your meal plan as told by your dietitian. What nutrients should be limited? The items listed are not a complete list. Talk with your dietitian about what dietary choices are best for you. Potassium Potassium affects how steadily your heart beats. If too much potassium builds up in your blood, it can cause an irregular heartbeat or even a heart attack. You may need to eat less potassium, depending on your blood potassium levels and the  stage of kidney disease. Talk to your dietitian about how much potassium you may have each day. You may need to limit or avoid foods that are high in potassium, such as:  Milk and soy milk.  Fruits, such as bananas, papaya, apricots, nectarines, melon, prunes, raisins, kiwi, and oranges.  Vegetables, such as potatoes, sweet potatoes, yams, tomatoes, leafy greens, beets, okra, avocado, pumpkin, and winter squash.  White and lima beans. Phosphorus Phosphorus is a mineral found in your bones. A balance between calcium and phosphorous is needed to build and maintain healthy bones. Too much phosphorus pulls calcium from your bones. This can make your bones weak and more likely to break. Too much phosphorus can also make your skin itch. You may need to eat less phosphorus depending on your blood phosphorus levels and the stage of kidney disease. Talk to your dietitian about how much potassium you may have each day. You may need to take medicine to lower your blood phosphorus levels if diet changes do not help. You may need to limit or avoid foods that are high in phosphorus, such as:  Milk and dairy products.  Dried beans and peas.  Tofu, soy milk, and other soy-based meat replacements.  Colas.  Nuts and peanut butter.  Meat, poultry, and fish.  Bran cereals and oatmeals. Protein Protein helps you to make and keep muscle. It also helps in the repair of your body's cells and tissues. One of the natural breakdown products of protein is a waste product called urea. When your kidneys are not working properly, they cannot remove wastes, such as urea, like they did before you developed chronic kidney disease. Reducing how much protein you eat can help prevent a buildup of urea in your blood. Depending on your stage of kidney disease, you may need to limit foods that are high in protein. Sources of animal protein include:  Meat (all types).  Fish and  seafood.  Poultry.  Eggs.  Dairy. Other protein foods include:  Beans and legumes.  Nuts and nut butter.  Soy and tofu. Sodium Sodium, which is found in salt, helps maintain a healthy balance of fluids in your body. Too much sodium can increase your blood pressure and have a negative effect on the function of your heart and lungs. Too much sodium can also cause your body to retain too much fluid, making your kidneys work harder. Most people should have less than 2,300 milligrams (mg) of sodium each day. If you have hypertension, you may need to limit your sodium to 1,500 mg each day. Talk to your dietitian about how much sodium you may have each day. You may need to limit or avoid foods that are high in sodium, such as:  Salt seasonings.  Soy sauce.  Cured and processed meats.  Salted crackers and snack foods.  Fast food.  Canned soups and most canned foods.  Pickled foods.  Vegetable juice.  Boxed mixes or ready-to-eat boxed meals and side dishes.  Bottled dressings, sauces, and marinades. Summary  Chronic kidney disease can lead to a buildup and imbalance of waste and excess substances in the body. Certain foods lead to a buildup of these substances. By adjusting your intake of these foods, you could help prevent more kidney damage and delay or prevent the need for dialysis.  Food adjustments are different for each person with chronic kidney disease. Work with a dietitian to set up nutrient goals and a meal plan that is right for you.  If you have diabetes and chronic kidney disease, it is important to keep your blood glucose in the target range recommended by your health care provider. This information is not intended to replace advice given to you by your health care provider. Make sure you discuss any questions you have with your health care provider. Document Revised: 12/08/2018 Document Reviewed: 08/12/2016 Elsevier Patient Education  2020 [employer].

## 2020-02-18 ENCOUNTER — Other Ambulatory Visit: Payer: Self-pay | Admitting: Family Medicine

## 2020-02-19 ENCOUNTER — Telehealth: Payer: Self-pay

## 2020-02-19 NOTE — Telephone Encounter (Signed)
Completed patient assistance documentation for [employer] (Januvia) and [employer] ([place]).   Pending items  [employer] - patient signature - prescriber's signature  [employer] - patient signature - prescriber's signature   Mailed applications to patient. Patient to bring back filled out application for completion. Will be faxed once applications are complete from provider and patient.   Anson Crofts, PharmD Clinical Pharmacist [facility] [place] [phone]

## 2020-02-28 NOTE — Telephone Encounter (Signed)
Coordinated application for signature completion from provider.  Pending return of completed application.    Plan to call patient by end of week to assess pending items and offer assistance if needed.

## 2020-03-11 ENCOUNTER — Ambulatory Visit (INDEPENDENT_AMBULATORY_CARE_PROVIDER_SITE_OTHER): Payer: Medicare Other | Admitting: Family Medicine

## 2020-03-11 ENCOUNTER — Encounter: Payer: Self-pay | Admitting: Family Medicine

## 2020-03-11 ENCOUNTER — Other Ambulatory Visit: Payer: Self-pay

## 2020-03-11 VITALS — BP 118/72 | HR 55 | Temp 97.8°F | Wt 228.7 lb

## 2020-03-11 DIAGNOSIS — E1165 Type 2 diabetes mellitus with hyperglycemia: Secondary | ICD-10-CM

## 2020-03-11 DIAGNOSIS — I10 Essential (primary) hypertension: Secondary | ICD-10-CM | POA: Diagnosis not present

## 2020-03-11 DIAGNOSIS — N529 Male erectile dysfunction, unspecified: Secondary | ICD-10-CM | POA: Diagnosis not present

## 2020-03-11 LAB — POCT GLYCOSYLATED HEMOGLOBIN (HGB A1C): Hemoglobin A1C: 7.9 % — AB (ref 4.0–5.6)

## 2020-03-11 MED ORDER — SILDENAFIL CITRATE 20 MG PO TABS: ORAL_TABLET | ORAL | 1 refills | Status: DC

## 2020-03-11 NOTE — Patient Instructions (Signed)
Lose some weight and increase exercise  Let's plan on 3 month follow up.   A1C today was 7.9%.

## 2020-03-11 NOTE — Progress Notes (Signed)
Established Patient Office Visit  Subjective:  Patient ID: Lucas Warren, male    DOB: 1946-03-16  Age: 74 y.o. MRN: [id number]  CC:  Chief Complaint  Patient presents with  . Follow-up    pt is here for a 4 month follow up in diabetes     HPI [name] presents for medical follow-up.  His last A1c was 7.2%.  He is seeing our clinical pharmacist and is getting some assistance with medications including Januvia and Jardiance which he thinks will help.  He has gained a few pounds since last visit.  Is not exercising much.  He started back playing some golf though and hopes to lose some further weight.  He has had poor compliance with diet intermittently.  He is very opposed to adding more medication if possible at this time.  Currently, diabetes treated with Januvia, Actos, Metformin, Jardiance, and Amaryl.  No recent hypoglycemia.  No polyuria or polydipsia.  His blood pressure is treated with lisinopril.  Denies any chronic cough.  Lipids were checked last December and stable.  He is on pravastatin for that.  He has history of erectile dysfunction.  Requesting refills of sildenafil.  No nitroglycerin use.  Past Medical History:  Diagnosis Date  . Allergy    seasonal  . Anxiety   . Chest pain, atypical   . Colon polyps    hyperplastic 2003  . DJD (degenerative joint disease)   . DM (diabetes mellitus) ([place])   . GERD (gastroesophageal reflux disease)   . History of headache   . Hypercholesteremia   . Psoriasis    Past Surgical History:  Procedure Laterality Date  . CATARACT EXTRACTION, BILATERAL Bilateral    Dr. Tommy Rainwater;   . INGUINAL HERNIA REPAIR  1989  . NASAL SEPTUM SURGERY  1989    reports that he quit smoking about 38 years ago. His smoking use included cigarettes. He has a 40.00 pack-year smoking history. He has never used smokeless tobacco. He reports current alcohol use. He reports that he does not use drugs. family history includes Alzheimer's disease in his  father; Heart disease in his father; Leukemia in his sister; Lung cancer in his mother. Allergies  Allergen Reactions  . Lisinopril Cough  . Erythromycin     REACTION: abd pain  . Trulicity [Dulaglutide] Nausea Only     Past Medical History:  Diagnosis Date  . Allergy    seasonal  . Anxiety   . Chest pain, atypical   . Colon polyps    hyperplastic 2003  . DJD (degenerative joint disease)   . DM (diabetes mellitus) ([place])   . GERD (gastroesophageal reflux disease)   . History of headache   . Hypercholesteremia   . Psoriasis     Past Surgical History:  Procedure Laterality Date  . CATARACT EXTRACTION, BILATERAL Bilateral    Dr. Tommy Rainwater;   . INGUINAL HERNIA REPAIR  1989  . NASAL SEPTUM SURGERY  1989    Family History  Problem Relation Age of Onset  . Lung cancer Mother   . Alzheimer's disease Father   . Heart disease Father   . Leukemia Sister     Social History   Socioeconomic History  . Marital status: Divorced    Spouse name: carolyn  . Number of children: Not on file  . Years of education: Not on file  . Highest education level: Not on file  Occupational History    Comment: Patent examiner  Tobacco  Use  . Smoking status: Former Smoker    Packs/day: 2.00    Years: 20.00    Pack years: 40.00    Types: Cigarettes    Quit date: 08/31/1981    Years since quitting: 38.5  . Smokeless tobacco: Never Used  Vaping Use  . Vaping Use: Never used  Substance and Sexual Activity  . Alcohol use: Yes    Comment: rarely a beer  . Drug use: No  . Sexual activity: Not on file  Other Topics Concern  . Not on file  Social History Narrative  . Not on file   Social Determinants of Health   Financial Resource Strain: Medium Risk  . Difficulty of Paying Living Expenses: Somewhat hard  Food Insecurity:   . Worried About Charity fundraiser in the Last Year:   . Arboriculturist in the Last Year:   Transportation Needs: No Transportation Needs  . Lack of  Transportation (Medical): No  . Lack of Transportation (Non-Medical): No  Physical Activity:   . Days of Exercise per Week:   . Minutes of Exercise per Session:   Stress:   . Feeling of Stress :   Social Connections:   . Frequency of Communication with Friends and Family:   . Frequency of Social Gatherings with Friends and Family:   . Attends Religious Services:   . Active Member of Clubs or Organizations:   . Attends Archivist Meetings:   Marland Kitchen Marital Status:   Intimate Partner Violence:   . Fear of Current or Ex-Partner:   . Emotionally Abused:   Marland Kitchen Physically Abused:   . Sexually Abused:     Outpatient Medications Prior to Visit  Medication Sig Dispense Refill  . aspirin 81 MG tablet 1 tablet twice per week    . glimepiride (AMARYL) 4 MG tablet Take 1 tablet by mouth once daily with breakfast 90 tablet 3  . JARDIANCE 10 MG TABS tablet Take 1 tablet by mouth once daily 90 tablet 0  . Lancets (ONETOUCH DELICA PLUS [id number]) MISC USE 1  TO CHECK GLUCOSE ONCE DAILY 100 each 0  . lisinopril (ZESTRIL) 10 MG tablet Take 1 tablet by mouth once daily 90 tablet 0  . metFORMIN (GLUCOPHAGE) 500 MG tablet Take 2 tablets by mouth twice daily 360 tablet 0  . ONETOUCH ULTRA test strip USE 1 STRIP TO CHECK GLUCOSE ONCE DAILY 100 each 0  . pioglitazone (ACTOS) 45 MG tablet Take 1 tablet (45 mg total) by mouth daily. 90 tablet 3  . pravastatin (PRAVACHOL) 40 MG tablet Take 1 tablet (40 mg total) by mouth daily. 90 tablet 3  . sitaGLIPtin (JANUVIA) 100 MG tablet Take 1 tablet (100 mg total) by mouth daily. 90 tablet 3  . vitamin B-12 (CYANOCOBALAMIN) 1000 MCG tablet Take 1,000 mcg by mouth daily.     . sildenafil (REVATIO) 20 MG tablet TAKE 2 TO 5 TABLETS BY MOUTH AS DIRECTED 90 tablet 2   No facility-administered medications prior to visit.    Allergies  Allergen Reactions  . Lisinopril Cough  . Erythromycin     REACTION: abd pain  . Trulicity [Dulaglutide] Nausea Only     ROS Review of Systems  Constitutional: Negative for fatigue and unexpected weight change.  Eyes: Negative for visual disturbance.  Respiratory: Negative for cough, chest tightness and shortness of breath.   Cardiovascular: Negative for chest pain, palpitations and leg swelling.  Endocrine: Negative for polydipsia and polyuria.  Neurological: Negative  for dizziness, syncope, weakness, light-headedness and headaches.      Objective:    Physical Exam Vitals reviewed.  Constitutional:      Appearance: Normal appearance.  Cardiovascular:     Rate and Rhythm: Normal rate and regular rhythm.  Pulmonary:     Effort: Pulmonary effort is normal.     Breath sounds: Normal breath sounds.  Musculoskeletal:     Right lower leg: No edema.     Left lower leg: No edema.  Neurological:     Mental Status: He is alert.     BP 118/72 (BP Location: Left Arm, Patient Position: Sitting, Cuff Size: Normal)   Pulse (!) 55   Temp 97.8 F (36.6 C) (Oral)   Wt 228 lb 11.2 oz (103.7 kg)   SpO2 97%   BMI 31.02 kg/m  Wt Readings from Last 3 Encounters:  03/11/20 228 lb 11.2 oz (103.7 kg)  11/06/19 224 lb 12.8 oz (102 kg)  08/07/19 222 lb 4.8 oz (100.8 kg)     There are no preventive care reminders to display for this patient.  There are no preventive care reminders to display for this patient.  Lab Results  Component Value Date   TSH 0.86 07/24/2015   Lab Results  Component Value Date   WBC 9.1 11/11/2018   HGB 13.9 11/11/2018   HCT 42.0 11/11/2018   MCV 85.5 11/11/2018   PLT 222.0 11/11/2018   Lab Results  Component Value Date   NA 138 08/15/2019   K 4.2 08/15/2019   CO2 22 08/15/2019   GLUCOSE 109 (H) 08/15/2019   BUN 28 (H) 08/15/2019   CREATININE 1.61 (H) 08/15/2019   BILITOT 0.5 08/07/2019   ALKPHOS 51 08/07/2019   AST 12 08/07/2019   ALT 7 08/07/2019   PROT 7.0 08/07/2019   ALBUMIN 4.5 08/07/2019   CALCIUM 9.1 08/15/2019   GFR 42.45 (L) 08/15/2019   Lab  Results  Component Value Date   CHOL 197 08/07/2019   Lab Results  Component Value Date   HDL 46.80 08/07/2019   Lab Results  Component Value Date   LDLCALC 119 (H) 08/07/2019   Lab Results  Component Value Date   TRIG 155.0 (H) 08/07/2019   Lab Results  Component Value Date   CHOLHDL 4 08/07/2019   Lab Results  Component Value Date   HGBA1C 7.9 (A) 03/11/2020      Assessment & Plan:   #1 type 2 diabetes.  A1c is worsened somewhat today at 7.9%.  He remains reluctant to add additional medication.  He has had previous intolerance of GLP-1 medication.  We discussed many times possibility of adding long-acting insulin but he is desiring 6-month trial of some additional weight loss and reassessing  #2 erectile dysfunction -Refill sildenafil for as needed use  #3 hypertension stable and at goal  Meds ordered this encounter  Medications  . sildenafil (REVATIO) 20 MG tablet    Sig: TAKE 2 TO 5 TABLETS BY MOUTH AS DIRECTED    Dispense:  90 tablet    Refill:  1    Follow-up: Return in about 3 months (around 06/11/2020).    [name], MD

## 2020-03-15 ENCOUNTER — Telehealth: Payer: Medicare Other

## 2020-03-18 ENCOUNTER — Other Ambulatory Visit: Payer: Self-pay

## 2020-03-18 ENCOUNTER — Ambulatory Visit: Payer: Medicare Other

## 2020-03-18 NOTE — Chronic Care Management (AMB) (Signed)
Chronic Care Management Pharmacy  Name: Lucas Warren  MRN: [id number] DOB: [birth year]  Initial Questions: 1. Have you seen any other providers since your last visit? NA 2. Any changes in your medicines or health? No   Chief Complaint/ HPI Lucas Warren,  74 y.o. , male presents for their Follow-Up CCM visit with the clinical pharmacist via telephone due to COVID-19 Pandemic.  Patient stated he was aware of increase of A1c and is working on losing weight. He reports he was able to complete patient assistance forms for Januvia and Jardiance.   Patient mentions still eating the same as last visit. For breakfast, [employer] pancakes and small honey bun.   PCP : [name], MD  Their chronic conditions include: DM, HTN, HLD, CKD stage 3  Office Visits: 03/11/2020- [name], MD- Patient presented for office visit for diabetes. A1c: 7.9% (worsened). Patient reluctant to add medications. Patient opted for 3 month trial of addition weight loss. Patient to follow up in 3 months.   11/06/2019- [name], MD- Patient presented for office visit for follow up. Patient to continue current medications. Patient was encouraged to step up exercise and aim to lose weight. Plan for 4 month follow up. No additional DM medications added due to A1c improvement.   08/07/2019- [name], MD- Patient presented for office visit for follow up. Patient needing refills for DM medications. Patient reluctant to start additional DM medications. Patient to step up exercise such as walking. Recommend 3 month follow up. Labs to be checked: lipid, hepatic, BMET.   Medications: Outpatient Encounter Medications as of 03/18/2020  Medication Sig  . aspirin 81 MG tablet 1 tablet twice per week  . glimepiride (AMARYL) 4 MG tablet Take 1 tablet by mouth once daily with breakfast  . JARDIANCE 10 MG TABS tablet Take 1 tablet by mouth once daily  . Lancets (ONETOUCH DELICA PLUS [id number]) MISC USE 1   TO CHECK GLUCOSE ONCE DAILY  . lisinopril (ZESTRIL) 10 MG tablet Take 1 tablet by mouth once daily  . metFORMIN (GLUCOPHAGE) 500 MG tablet Take 2 tablets by mouth twice daily  . ONETOUCH ULTRA test strip USE 1 STRIP TO CHECK GLUCOSE ONCE DAILY  . pioglitazone (ACTOS) 45 MG tablet Take 1 tablet (45 mg total) by mouth daily.  . pravastatin (PRAVACHOL) 40 MG tablet Take 1 tablet (40 mg total) by mouth daily.  . sildenafil (REVATIO) 20 MG tablet TAKE 2 TO 5 TABLETS BY MOUTH AS DIRECTED  . sitaGLIPtin (JANUVIA) 100 MG tablet Take 1 tablet (100 mg total) by mouth daily.  . vitamin B-12 (CYANOCOBALAMIN) 1000 MCG tablet Take 1,000 mcg by mouth daily.    No facility-administered encounter medications on file as of 03/18/2020.     Current Diagnosis/Assessment:  Goals Addressed            This Visit's Progress   . Pharmacy Care Plan       CARE PLAN ENTRY  Current Barriers:  . Chronic Disease Management support, education, and care coordination needs related to Hypertension, Hyperlipidemia, Diabetes, and Chronic Kidney Disease   Hypertension . Pharmacist Clinical Goal(s): o Over the next 90 days, patient will work with PharmD and providers to maintain BP goal <130/80 . Current regimen:  o Lisinopril 10mg , 1 tablet once daily . Interventions: o We discussed fall risk precautions and taking time before taking first step after standing up. . Patient self care activities - Over the next 90 days, patient will: o Check  BP as directed, document, and provide at future appointments o Ensure daily salt intake < 2300 mg/day  Hyperlipidemia . Pharmacist Clinical Goal(s): o Over the next 90 days, patient will work with PharmD and providers to achieve LDL goal < 100 . Current regimen:  o Pravastatin 40mg , 1 tablet once daily . Patient self care activities - Over the next 90 days, patient will: o Continue current medications.   Diabetes . Pharmacist Clinical Goal(s): o Over the next 30 days,  patient will work with PharmD and providers to achieve A1c goal <7% . Current regimen:   Glimepiride 4mg , 1 tablet once daily with breakfast  Jardiance 10mg , 1 tablet once daily  Metformin 500mg , 2 tablets twice daily  Pioglitazone (Actos) 45mg , 1 tablet once daily  sitagliptin (Januvia) 100mg , 1 tablet once daily . Interventions: o We discussed alternating times when blood sugars are checking in the day.  o [employer] patient assistance forms are completed and were mailed/ faxed.  . Patient self care activities - Over the next 30 days, patient will: o Check blood sugar once daily, document, and provide at future appointments o Contact provider with any episodes of hypoglycemia  Chronic kidney disease . Pharmacist Clinical Goal(s) o Over the next 90 days, patient will work with PharmD and providers to maintain kidney function.  . Patient self care activities o Patient will continue to maintain proper hydration and avoid medications like NSAIDs (ibuprofen, naproxen, etc.).   Medication management . Pharmacist Clinical Goal(s): o Over the next 90 days, patient will work with PharmD and providers to maintain optimal medication adherence . Current pharmacy: [facility] . Interventions o Comprehensive medication review performed. o Continue current medication management strategy . Patient self care activities - Over the next 90 days, patient will: o Take medications as prescribed o Report any questions or concerns to PharmD and/or provider(s)  Please see past updates related to this goal by clicking on the "Past Updates" button in the selected goal        SDOH Interventions     Most Recent Value  SDOH Interventions  Financial Strain Interventions Other (Comment)  [Patient assistance forms mailed and faxed for Januvia and [place]      SDOH Interventions     Most Recent Value  SDOH Interventions  Financial Strain Interventions Other (Comment)  [Patient assistance  forms mailed and faxed for Januvia and Jardiance]        Diabetes   Recent Relevant Labs: Lab Results  Component Value Date/Time   HGBA1C 7.9 (A) 03/11/2020 08:24 AM   HGBA1C 7.2 (A) 11/06/2019 08:33 AM   HGBA1C 8.3 (H) 12/25/2015 08:39 AM   HGBA1C 8.4 10/14/2015 12:00 AM   HGBA1C 7.3 (H) 05/22/2015 09:53 AM   MICROALBUR 4.3 (H) 01/10/2018 08:54 AM   MICROALBUR 2.1 (H) 02/02/2014 09:40 AM    Checking BG: Daily  Recent FBS Readings: Reports BGs have been around the same as last visit.   Previous FBS readings:   85, 94, 107, 110, 123, 99 117, 75 97, 111, 104, 114, 93, 133 (had dessert)   Recent PPBG readings: 210 mg/dl (patient unsure if it was 2 hours post meal)   Patient has failed these meds in past: Trulicity (nausea), Onglyza (insurance formulary)    Patient is currently uncontrolled on the following medications:   Glimepiride 4mg , 1 tablet once daily with breakfast  empagliflozin (Jardiance) 10mg , 1 tablet once daily  Metformin 500mg , 2 tablets twice daily  Pioglitazone (Actos) 45mg , 1  tablet once daily  sitagliptin (Januvia) 100mg , 1 tablet once daily   Last diabetic Eye exam:  Lab Results  Component Value Date/Time   HMDIABEYEEXA No Retinopathy 02/28/2018 12:00 AM    Last diabetic Foot exam:  Lab Results  Component Value Date/Time   HMDIABFOOTEX normal 03/25/2016 12:00 AM    We discussed: diet and exercise extensively (food options for breakfast and lunch are high in carbohydrates.   Breakfast - 2 [employer] pancakes - Honey Bun (small bun)  Lunch - Crackers (peanut butter, wafer)- eats a pack for lunch.  Dinner (5-6pm)  - Salad, black olives, ranch dressing or - 5 wings and 5 onion rings or  - [employer]- black forest ham; oatmeal cookie    Plan Continue current medications  Recommend to continue checking Blood glucose daily AND alternating checking time of day since FBS are within target but unclear how blood glucose is doing later in the day  (questionable due to reported food options)   Follow up in 1 month to reassess blood glucose readings, diet/ exercise, and patient assistance status (if needed).   Hypertension   Denies persistent dizziness/lightheadedness.  Endorses orthostatic hypotension- but not persistent.   Office blood pressures are  BP Readings from Last 3 Encounters:  03/11/20 118/72  11/06/19 118/70  08/07/19 128/78   Patient has failed these meds in the past: none  Patient checks BP at home does not check at home   Patient home BP readings are ranging: NA  Patient is controlled on:   Lisinopril 10mg , 1 tablet once daily  We discussed fall risk precautions and taking time before taking first step after standing up.  Plan Continue current medications   Hyperlipidemia   Lipid Panel     Component Value Date/Time   CHOL 197 08/07/2019 0848   TRIG 155.0 (H) 08/07/2019 0848   HDL 46.80 08/07/2019 0848   LDLCALC 119 (H) 08/07/2019 0848    The 10-year ASCVD risk score Mikey Bussing DC Jr., et al., 2013) is: 36.9%   Values used to calculate the score:     Age: 42 years     Sex: Male     Is Non-Hispanic African American: No     Diabetic: Yes     Tobacco smoker: No     Systolic Blood Pressure: 425 mmHg     Is BP treated: Yes     HDL Cholesterol: 46.8 mg/dL     Total Cholesterol: 197 mg/dL   Patient has failed these meds in past: rosuvastatin (elevated BGs), simvastatin (myalgia)  Patient is currently uncontrolled on the following medications:  . Pravastatin 40mg , 1 tablet once daily  We discussed:  diet and exercise extensively (see above).   Plan Continue current medications  Primary prevention ASCVD   Patient is currently controlled on the following medications:   Aspirin 81mg , 1 tablet every 2 weeks   Plan Continue current medications  OTC/ supplements  Patient is currently on the following medications:  Marland Kitchen Vitamin B12 1088mcg, 1 tablet once daily    Plan Continue current  medications   CKD, Stage 3   Kidney Function Lab Results  Component Value Date/Time   CREATININE 1.61 (H) 08/15/2019 07:32 AM   CREATININE 1.52 (H) 08/07/2019 08:48 AM   GFR 42.45 (L) 08/15/2019 07:32 AM   GFRNONAA 95.70 04/04/2010 09:56 AM   GFRAA 98 10/03/2008 11:01 AM    Plan Continue to monitor and adjust medications as needed.   - consider dose reduction of Januvia if  renal function decreases   Medication Management  Patient organizes medications: patient has own strategy.  Separates meds by AM and PM.  Primary pharmacy: [facility] (goes to drive through)  Adherence: none     Follow up Follow up visit with PharmD in 1 month to review PAP status if needed and DM reassessment.      Anson Crofts, PharmD Clinical Pharmacist [facility] [place] [phone]

## 2020-03-22 NOTE — Patient Instructions (Signed)
Visit Information  Goals Addressed            This Visit's Progress   . Pharmacy Care Plan       CARE PLAN ENTRY  Current Barriers:  . Chronic Disease Management support, education, and care coordination needs related to Hypertension, Hyperlipidemia, Diabetes, and Chronic Kidney Disease   Hypertension . Pharmacist Clinical Goal(s): o Over the next 90 days, patient will work with PharmD and providers to maintain BP goal <130/80 . Current regimen:  o Lisinopril 10mg , 1 tablet once daily . Interventions: o We discussed fall risk precautions and taking time before taking first step after standing up. . Patient self care activities - Over the next 90 days, patient will: o Check BP as directed, document, and provide at future appointments o Ensure daily salt intake < 2300 mg/day  Hyperlipidemia . Pharmacist Clinical Goal(s): o Over the next 90 days, patient will work with PharmD and providers to achieve LDL goal < 100 . Current regimen:  o Pravastatin 40mg , 1 tablet once daily . Patient self care activities - Over the next 90 days, patient will: o Continue current medications.   Diabetes . Pharmacist Clinical Goal(s): o Over the next 30 days, patient will work with PharmD and providers to achieve A1c goal <7% . Current regimen:   Glimepiride 4mg , 1 tablet once daily with breakfast  Jardiance 10mg , 1 tablet once daily  Metformin 500mg , 2 tablets twice daily  Pioglitazone (Actos) 45mg , 1 tablet once daily  sitagliptin (Januvia) 100mg , 1 tablet once daily . Interventions: o We discussed alternating times when blood sugars are checking in the day.  o [employer] patient assistance forms are completed and were mailed/ faxed.  . Patient self care activities - Over the next 30 days, patient will: o Check blood sugar once daily, document, and provide at future appointments o Contact provider with any episodes of hypoglycemia  Chronic kidney disease . Pharmacist  Clinical Goal(s) o Over the next 90 days, patient will work with PharmD and providers to maintain kidney function.  . Patient self care activities o Patient will continue to maintain proper hydration and avoid medications like NSAIDs (ibuprofen, naproxen, etc.).   Medication management . Pharmacist Clinical Goal(s): o Over the next 90 days, patient will work with PharmD and providers to maintain optimal medication adherence . Current pharmacy: [facility] . Interventions o Comprehensive medication review performed. o Continue current medication management strategy . Patient self care activities - Over the next 90 days, patient will: o Take medications as prescribed o Report any questions or concerns to PharmD and/or provider(s)  Please see past updates related to this goal by clicking on the "Past Updates" button in the selected goal         Patient verbalizes understanding of instructions provided today.   Telephone follow up appointment with pharmacy team member scheduled for: 04/22/2020  Anson Crofts, PharmD Clinical Pharmacist [facility] [place] [phone]

## 2020-03-29 ENCOUNTER — Telehealth: Payer: Self-pay

## 2020-03-29 ENCOUNTER — Other Ambulatory Visit: Payer: Self-pay | Admitting: Family Medicine

## 2020-03-29 NOTE — Chronic Care Management (AMB) (Addendum)
    Chronic Care Management Pharmacy Assistant   Name: Lucas Warren  MRN: [id number] DOB: [birth year]  Reason for Encounter: Patient Assistance  I contacted [employer] in regards to [place] to follow up on PAP status, they mailed the patient a hardship letter to be completed and returned. I then contacted [employer] in regards to [place] who was unable to approve the patient's application due to the patient indicating he had private insurance on previously submitted application. Shanon Brow at [facility] informed me that if the patient checked this section on the application in error, he could call the company and correct it over the phone. I called the patient relayed all of this information to the patient on today's phone encounter. Patient stated he would contact [employer] right away  PCP : [name], MD  Allergies:   Allergies  Allergen Reactions   Lisinopril Cough   Erythromycin     REACTION: abd pain   Trulicity [Dulaglutide] Nausea Only    Medications: Outpatient Encounter Medications as of 03/29/2020  Medication Sig   aspirin 81 MG tablet 1 tablet twice per week   glimepiride (AMARYL) 4 MG tablet Take 1 tablet by mouth once daily with breakfast   JARDIANCE 10 MG TABS tablet Take 1 tablet by mouth once daily   Lancets (ONETOUCH DELICA PLUS [id number]) MISC USE 1  TO CHECK GLUCOSE ONCE DAILY   lisinopril (ZESTRIL) 10 MG tablet Take 1 tablet by mouth once daily   metFORMIN (GLUCOPHAGE) 500 MG tablet Take 2 tablets by mouth twice daily   ONETOUCH ULTRA test strip USE 1 STRIP TO CHECK GLUCOSE ONCE DAILY   pioglitazone (ACTOS) 45 MG tablet Take 1 tablet (45 mg total) by mouth daily.   pravastatin (PRAVACHOL) 40 MG tablet Take 1 tablet (40 mg total) by mouth daily.   sildenafil (REVATIO) 20 MG tablet TAKE 2 TO 5 TABLETS BY MOUTH AS DIRECTED   sitaGLIPtin (JANUVIA) 100 MG tablet Take 1 tablet (100 mg total) by mouth daily.   vitamin B-12 (CYANOCOBALAMIN) 1000 MCG tablet Take 1,000 mcg by  mouth daily.    No facility-administered encounter medications on file as of 03/29/2020.    Current Diagnosis: Patient Active Problem List   Diagnosis Date Noted   Erectile dysfunction 03/11/2020   CKD (chronic kidney disease) stage 3, GFR 30-59 ml/min 11/06/2019   Cough 08/19/2016   Hemoptysis 08/19/2016   Normocytic anemia 06/29/2016   Hypertension 12/25/2015   Obesity (BMI 30-39.9) 02/02/2014   Acute URI 10/10/2012   Overweight 04/05/2012   Venous insufficiency 04/02/2011   HEADACHE 02/25/2008   CHEST PAIN, ATYPICAL 02/25/2008   HYPERCHOLESTEROLEMIA 02/24/2008   Poorly controlled type 2 diabetes mellitus ([place]) 02/23/2008   ANXIETY 02/23/2008   GERD 02/23/2008   PSORIASIS 02/23/2008   DEGENERATIVE JOINT DISEASE 02/23/2008   COLONIC POLYPS, HX OF 02/23/2008    Goals Addressed   None     Follow-Up:  Patient Assistance Coordination

## 2020-03-31 ENCOUNTER — Other Ambulatory Visit: Payer: Self-pay | Admitting: Family Medicine

## 2020-04-02 DIAGNOSIS — H5202 Hypermetropia, left eye: Secondary | ICD-10-CM | POA: Diagnosis not present

## 2020-04-02 DIAGNOSIS — E119 Type 2 diabetes mellitus without complications: Secondary | ICD-10-CM | POA: Diagnosis not present

## 2020-04-02 LAB — HM DIABETES EYE EXAM

## 2020-04-16 ENCOUNTER — Other Ambulatory Visit: Payer: Self-pay | Admitting: Family Medicine

## 2020-04-22 ENCOUNTER — Telehealth: Payer: Medicare Other

## 2020-04-26 ENCOUNTER — Ambulatory Visit (INDEPENDENT_AMBULATORY_CARE_PROVIDER_SITE_OTHER): Payer: Medicare Other

## 2020-04-26 ENCOUNTER — Other Ambulatory Visit: Payer: Self-pay

## 2020-04-26 DIAGNOSIS — Z0000 Encounter for general adult medical examination without abnormal findings: Secondary | ICD-10-CM

## 2020-04-26 NOTE — Patient Instructions (Signed)
Lucas Warren , Thank you for taking time to come for your Medicare Wellness Visit. I appreciate your ongoing commitment to your health goals. Please review the following plan we discussed and let me know if I can assist you in the future.   Screening recommendations/referrals: Colonoscopy: Up to date, next due 06/09/2022 Recommended yearly ophthalmology/optometry visit for glaucoma screening and checkup Recommended yearly dental visit for hygiene and checkup  Vaccinations: Influenza vaccine: Up to date, next due this flu season 2021 Pneumococcal vaccine: Completed series  Tdap vaccine: Up to date, next due 03/25/2026 Shingles vaccine: Currently due for shingrix.  Please contact your pharmacy to discuss cost and to receive vaccine  Advanced directives: copies on file   Conditions/risks identified: Please try to incorporate at least 30 minutes of exercises daily into your routine   Next appointment: 04/29/2020 @ 72:30 am with Dr. Elease Hashimoto  Preventive Care 72 Years and Older, Male Preventive care refers to lifestyle choices and visits with your health care provider that can promote health and wellness. What does preventive care include?  A yearly physical exam. This is also called an annual well check.  Dental exams once or twice a year.  Routine eye exams. Ask your health care provider how often you should have your eyes checked.  Personal lifestyle choices, including:  Daily care of your teeth and gums.  Regular physical activity.  Eating a healthy diet.  Avoiding tobacco and drug use.  Limiting alcohol use.  Practicing safe sex.  Taking low doses of aspirin every day.  Taking vitamin and mineral supplements as recommended by your health care provider. What happens during an annual well check? The services and screenings done by your health care provider during your annual well check will depend on your age, overall health, lifestyle risk factors, and family history of  disease. Counseling  Your health care provider may ask you questions about your:  Alcohol use.  Tobacco use.  Drug use.  Emotional well-being.  Home and relationship well-being.  Sexual activity.  Eating habits.  History of falls.  Memory and ability to understand (cognition).  Work and work Statistician. Screening  You may have the following tests or measurements:  Height, weight, and BMI.  Blood pressure.  Lipid and cholesterol levels. These may be checked every 5 years, or more frequently if you are over 73 years old.  Skin check.  Lung cancer screening. You may have this screening every year starting at age 30 if you have a 30-pack-year history of smoking and currently smoke or have quit within the past 15 years.  Fecal occult blood test (FOBT) of the stool. You may have this test every year starting at age 67.  Flexible sigmoidoscopy or colonoscopy. You may have a sigmoidoscopy every 5 years or a colonoscopy every 10 years starting at age 45.  Prostate cancer screening. Recommendations will vary depending on your family history and other risks.  Hepatitis C blood test.  Hepatitis B blood test.  Sexually transmitted disease (STD) testing.  Diabetes screening. This is done by checking your blood sugar (glucose) after you have not eaten for a while (fasting). You may have this done every 1-3 years.  Abdominal aortic aneurysm (AAA) screening. You may need this if you are a current or former smoker.  Osteoporosis. You may be screened starting at age 24 if you are at high risk. Talk with your health care provider about your test results, treatment options, and if necessary, the need for more tests.  Vaccines  Your health care provider may recommend certain vaccines, such as:  Influenza vaccine. This is recommended every year.  Tetanus, diphtheria, and acellular pertussis (Tdap, Td) vaccine. You may need a Td booster every 10 years.  Zoster vaccine. You may  need this after age 55.  Pneumococcal 13-valent conjugate (PCV13) vaccine. One dose is recommended after age 64.  Pneumococcal polysaccharide (PPSV23) vaccine. One dose is recommended after age 43. Talk to your health care provider about which screenings and vaccines you need and how often you need them. This information is not intended to replace advice given to you by your health care provider. Make sure you discuss any questions you have with your health care provider. Document Released: 09/13/2015 Document Revised: 05/06/2016 Document Reviewed: 06/18/2015 Elsevier Interactive Patient Education  2017 [employer] Prevention in the Home Falls can cause injuries. They can happen to people of all ages. There are many things you can do to make your home safe and to help prevent falls. What can I do on the outside of my home?  Regularly fix the edges of walkways and driveways and fix any cracks.  Remove anything that might make you trip as you walk through a door, such as a raised step or threshold.  Trim any bushes or trees on the path to your home.  Use bright outdoor lighting.  Clear any walking paths of anything that might make someone trip, such as rocks or tools.  Regularly check to see if handrails are loose or broken. Make sure that both sides of any steps have handrails.  Any raised decks and porches should have guardrails on the edges.  Have any leaves, snow, or ice cleared regularly.  Use sand or salt on walking paths during winter.  Clean up any spills in your garage right away. This includes oil or grease spills. What can I do in the bathroom?  Use night lights.  Install grab bars by the toilet and in the tub and shower. Do not use towel bars as grab bars.  Use non-skid mats or decals in the tub or shower.  If you need to sit down in the shower, use a plastic, non-slip stool.  Keep the floor dry. Clean up any water that spills on the floor as soon as it  happens.  Remove soap buildup in the tub or shower regularly.  Attach bath mats securely with double-sided non-slip rug tape.  Do not have throw rugs and other things on the floor that can make you trip. What can I do in the bedroom?  Use night lights.  Make sure that you have a light by your bed that is easy to reach.  Do not use any sheets or blankets that are too big for your bed. They should not hang down onto the floor.  Have a firm chair that has side arms. You can use this for support while you get dressed.  Do not have throw rugs and other things on the floor that can make you trip. What can I do in the kitchen?  Clean up any spills right away.  Avoid walking on wet floors.  Keep items that you use a lot in easy-to-reach places.  If you need to reach something above you, use a strong step stool that has a grab bar.  Keep electrical cords out of the way.  Do not use floor polish or wax that makes floors slippery. If you must use wax, use non-skid floor wax.  Do not have throw rugs and other things on the floor that can make you trip. What can I do with my stairs?  Do not leave any items on the stairs.  Make sure that there are handrails on both sides of the stairs and use them. Fix handrails that are broken or loose. Make sure that handrails are as long as the stairways.  Check any carpeting to make sure that it is firmly attached to the stairs. Fix any carpet that is loose or worn.  Avoid having throw rugs at the top or bottom of the stairs. If you do have throw rugs, attach them to the floor with carpet tape.  Make sure that you have a light switch at the top of the stairs and the bottom of the stairs. If you do not have them, ask someone to add them for you. What else can I do to help prevent falls?  Wear shoes that:  Do not have high heels.  Have rubber bottoms.  Are comfortable and fit you well.  Are closed at the toe. Do not wear sandals.  If you  use a stepladder:  Make sure that it is fully opened. Do not climb a closed stepladder.  Make sure that both sides of the stepladder are locked into place.  Ask someone to hold it for you, if possible.  Clearly mark and make sure that you can see:  Any grab bars or handrails.  First and last steps.  Where the edge of each step is.  Use tools that help you move around (mobility aids) if they are needed. These include:  Canes.  Walkers.  Scooters.  Crutches.  Turn on the lights when you go into a dark area. Replace any light bulbs as soon as they burn out.  Set up your furniture so you have a clear path. Avoid moving your furniture around.  If any of your floors are uneven, fix them.  If there are any pets around you, be aware of where they are.  Review your medicines with your doctor. Some medicines can make you feel dizzy. This can increase your chance of falling. Ask your doctor what other things that you can do to help prevent falls. This information is not intended to replace advice given to you by your health care provider. Make sure you discuss any questions you have with your health care provider. Document Released: 06/13/2009 Document Revised: 01/23/2016 Document Reviewed: 09/21/2014 Elsevier Interactive Patient Education  2017 [employer].

## 2020-04-26 NOTE — Progress Notes (Signed)
Subjective:   Lucas Warren is a 74 y.o. male who presents for Medicare Annual/Subsequent preventive examination.  I connected with Lamount Bankson today by telephone and verified that I am speaking with the correct person using two identifiers. Location patient: home Location provider: work Persons participating in the virtual visit: patient, provider.   I discussed the limitations, risks, security and privacy concerns of performing an evaluation and management service by telephone and the availability of in person appointments. I also discussed with the patient that there may be a patient responsible charge related to this service. The patient expressed understanding and verbally consented to this telephonic visit.    Interactive audio and video telecommunications were attempted between this provider and patient, however failed, due to patient having technical difficulties OR patient did not have access to video capability.  We continued and completed visit with audio only.      Review of Systems    N/A Cardiac Risk Factors include: advanced age (>53men, >18 women);diabetes mellitus;male gender;dyslipidemia;hypertension     Objective:    Today's Vitals   There is no height or weight on file to calculate BMI.  Advanced Directives 04/26/2020 01/18/2018 01/06/2017 03/25/2016  Does Patient Have a Medical Advance Directive? Yes Yes Yes Yes  Type of Paramedic of [place];Living will - - Living will;Healthcare Power of Attorney  Does patient want to make changes to medical advance directive? No - Patient declined - - -  Copy of [place] in Chart? No - copy requested - - -    Current Medications (verified) Outpatient Encounter Medications as of 04/26/2020  Medication Sig  . aspirin 81 MG tablet 1 tablet twice per week  . glimepiride (AMARYL) 4 MG tablet Take 1 tablet by mouth once daily with breakfast  . JARDIANCE 10 MG TABS tablet Take 1  tablet by mouth once daily  . Lancets (ONETOUCH DELICA PLUS [id number]) MISC USE 1  TO CHECK GLUCOSE ONCE DAILY  . lisinopril (ZESTRIL) 10 MG tablet Take 1 tablet by mouth once daily  . metFORMIN (GLUCOPHAGE) 500 MG tablet Take 2 tablets by mouth twice daily  . ONETOUCH ULTRA test strip USE 1 STRIP TO CHECK GLUCOSE ONCE DAILY  . pioglitazone (ACTOS) 45 MG tablet Take 1 tablet (45 mg total) by mouth daily.  . pravastatin (PRAVACHOL) 40 MG tablet Take 1 tablet (40 mg total) by mouth daily.  . sildenafil (REVATIO) 20 MG tablet TAKE 2 TO 5 TABLETS BY MOUTH AS DIRECTED  . sitaGLIPtin (JANUVIA) 100 MG tablet Take 1 tablet (100 mg total) by mouth daily.  . vitamin B-12 (CYANOCOBALAMIN) 1000 MCG tablet Take 1,000 mcg by mouth daily.    No facility-administered encounter medications on file as of 04/26/2020.    Allergies (verified) Lisinopril, Erythromycin, and Trulicity [dulaglutide]   History: Past Medical History:  Diagnosis Date  . Allergy    seasonal  . Anxiety   . Chest pain, atypical   . Colon polyps    hyperplastic 2003  . DJD (degenerative joint disease)   . DM (diabetes mellitus) ([place])   . GERD (gastroesophageal reflux disease)   . History of headache   . Hypercholesteremia   . Psoriasis    Past Surgical History:  Procedure Laterality Date  . CATARACT EXTRACTION, BILATERAL Bilateral    Dr. Tommy Rainwater;   . INGUINAL HERNIA REPAIR  1989  . NASAL SEPTUM SURGERY  1989   Family History  Problem Relation Age of Onset  .  Lung cancer Mother   . Alzheimer's disease Father   . Heart disease Father   . Leukemia Sister    Social History   Socioeconomic History  . Marital status: Divorced    Spouse name: carolyn  . Number of children: Not on file  . Years of education: Not on file  . Highest education level: Not on file  Occupational History    Comment: Patent examiner  Tobacco Use  . Smoking status: Former Smoker    Packs/day: 2.00    Years: 20.00    Pack years:  40.00    Types: Cigarettes    Quit date: 08/31/1981    Years since quitting: 38.6  . Smokeless tobacco: Never Used  Vaping Use  . Vaping Use: Never used  Substance and Sexual Activity  . Alcohol use: Yes    Comment: rarely a beer  . Drug use: No  . Sexual activity: Not on file  Other Topics Concern  . Not on file  Social History Narrative  . Not on file   Social Determinants of Health   Financial Resource Strain: Low Risk   . Difficulty of Paying Living Expenses: Not hard at all  Food Insecurity: No Food Insecurity  . Worried About Charity fundraiser in the Last Year: Never true  . Ran Out of Food in the Last Year: Never true  Transportation Needs: No Transportation Needs  . Lack of Transportation (Medical): No  . Lack of Transportation (Non-Medical): No  Physical Activity: Inactive  . Days of Exercise per Week: 0 days  . Minutes of Exercise per Session: 0 min  Stress: No Stress Concern Present  . Feeling of Stress : Not at all  Social Connections: Moderately Integrated  . Frequency of Communication with Friends and Family: More than three times a week  . Frequency of Social Gatherings with Friends and Family: Three times a week  . Attends Religious Services: More than 4 times per year  . Active Member of Clubs or Organizations: Yes  . Attends Archivist Meetings: More than 4 times per year  . Marital Status: Divorced    Tobacco Counseling Counseling given: Not Answered   Clinical Intake:  Pre-visit preparation completed: Yes  Pain : No/denies pain     Nutritional Risks: None Diabetes: Yes (Patient states checks blood sugars every morning) CBG done?: No Did pt. bring in CBG monitor from home?: No  How often do you need to have someone help you when you read instructions, pamphlets, or other written materials from your doctor or pharmacy?: 1 - Never What is the last grade level you completed in school?: GED  Diabetic?Yes  Interpreter Needed?:  No  Information entered by :: [place] of Daily Living In your present state of health, do you have any difficulty performing the following activities: 04/26/2020  Hearing? N  Vision? N  Difficulty concentrating or making decisions? N  Walking or climbing stairs? N  Dressing or bathing? N  Doing errands, shopping? N  Preparing Food and eating ? N  Using the Toilet? N  In the past six months, have you accidently leaked urine? N  Do you have problems with loss of bowel control? N  Managing your Medications? N  Managing your Finances? N  Housekeeping or managing your Housekeeping? N  Some recent data might be hidden    Patient Care Team: [name], MD as PCP - General (Family Medicine) Earnie Larsson, [facility] as  Pharmacist (Pharmacist)  Indicate any recent Medical Services you may have received from other than [facility] providers in the past year (date may be approximate).     Assessment:   This is a routine wellness examination for [place].  Hearing/Vision screen  Hearing Screening   125Hz  250Hz  500Hz  1000Hz  2000Hz  3000Hz  4000Hz  6000Hz  8000Hz   Right ear:           Left ear:           Vision Screening Comments: Patient stated that he gets eyes checked annually   Dietary issues and exercise activities discussed:    Goals    . Exercise 150 minutes per week (moderate activity)     Wants to go back to the gym Wants to do more wood work  will visit with Jackelyn Poling regarding Diabetes    . Patient Stated     To eat nutritiously   Check out  online nutrition programs as GumSearch.nl and http://vang.com/; fit86me; Look for foods with "whole" wheat; bran; oatmeal etc Shot at the farmer's markets in season for fresher choices  Watch for "hydrogenated" on the label of oils which are trans-fats.  Watch for "high fructose corn syrup" in snacks, yogurt or ketchup  Meats have less marbling; bright colored fruits and vegetables;  Canned; dump out liquid and  wash vegetables. Be mindful of what we are eating  Portion control is essential to a health weight! Sit down; take a break and enjoy your meal; take smaller bites; put the fork down between bites;  It takes 20 minutes to get full; so check in with your fullness cues and stop eating when you start to fill full            . Patient Stated     I will continue to play golf once a week     . Pharmacy Care Plan     CARE PLAN ENTRY  Current Barriers:  . Chronic Disease Management support, education, and care coordination needs related to Hypertension, Hyperlipidemia, Diabetes, and Chronic Kidney Disease   Hypertension . Pharmacist Clinical Goal(s): o Over the next 90 days, patient will work with PharmD and providers to maintain BP goal <130/80 . Current regimen:  o Lisinopril 10mg , 1 tablet once daily . Interventions: o We discussed fall risk precautions and taking time before taking first step after standing up. . Patient self care activities - Over the next 90 days, patient will: o Check BP as directed, document, and provide at future appointments o Ensure daily salt intake < 2300 mg/day  Hyperlipidemia . Pharmacist Clinical Goal(s): o Over the next 90 days, patient will work with PharmD and providers to achieve LDL goal < 100 . Current regimen:  o Pravastatin 40mg , 1 tablet once daily . Patient self care activities - Over the next 90 days, patient will: o Continue current medications.   Diabetes . Pharmacist Clinical Goal(s): o Over the next 30 days, patient will work with PharmD and providers to achieve A1c goal <7% . Current regimen:   Glimepiride 4mg , 1 tablet once daily with breakfast  Jardiance 10mg , 1 tablet once daily  Metformin 500mg , 2 tablets twice daily  Pioglitazone (Actos) 45mg , 1 tablet once daily  sitagliptin (Januvia) 100mg , 1 tablet once daily . Interventions: o We discussed alternating times when blood sugars are checking in the day.  o [employer] and  [employer] patient assistance forms are completed and were mailed/ faxed.  . Patient self care activities - Over the next 30  days, patient will: o Check blood sugar once daily, document, and provide at future appointments o Contact provider with any episodes of hypoglycemia  Chronic kidney disease . Pharmacist Clinical Goal(s) o Over the next 90 days, patient will work with PharmD and providers to maintain kidney function.  . Patient self care activities o Patient will continue to maintain proper hydration and avoid medications like NSAIDs (ibuprofen, naproxen, etc.).   Medication management . Pharmacist Clinical Goal(s): o Over the next 90 days, patient will work with PharmD and providers to maintain optimal medication adherence . Current pharmacy: [facility] . Interventions o Comprehensive medication review performed. o Continue current medication management strategy . Patient self care activities - Over the next 90 days, patient will: o Take medications as prescribed o Report any questions or concerns to PharmD and/or provider(s)  Please see past updates related to this goal by clicking on the "Past Updates" button in the selected goal        Depression Screen PHQ 2/9 Scores 04/26/2020 11/06/2019 01/18/2018 01/10/2018 01/06/2017 01/06/2017 12/25/2015  PHQ - 2 Score 0 0 0 3 0 0 0  PHQ- 9 Score 0 0 - 4 - - -    Fall Risk Fall Risk  04/26/2020 11/06/2019 01/18/2018 01/10/2018 01/06/2017  Falls in the past year? 0 0 No No No  Number falls in past yr: 0 - - - -  Injury with Fall? 0 - - - -  Risk for fall due to : Medication side effect - - - -  Follow up Falls evaluation completed;Falls prevention discussed - - - -    Any stairs in or around the home? No  If so, are there any without handrails? No  Home free of loose throw rugs in walkways, pet beds, electrical cords, etc? Yes  Adequate lighting in your home to reduce risk of falls? Yes   ASSISTIVE DEVICES UTILIZED TO PREVENT FALLS:  Life  alert? No  Use of a cane, walker or w/c? No  Grab bars in the bathroom? Yes  Shower chair or bench in shower? No  Elevated toilet seat or a handicapped toilet? Yes    Cognitive Function: Cognitive screening not indicated based on direct observation MMSE - Mini Mental State Exam 01/18/2018 01/06/2017  Not completed: (No Data) (No Data)        Immunizations Immunization History  Administered Date(s) Administered  . Fluad Quad(high Dose 65+) 05/09/2019  . Influenza Split 10/05/2011  . Influenza Whole 07/16/2009  . Influenza, High Dose Seasonal PF 05/22/2015, 06/29/2016, 06/15/2017, 05/20/2018  . Influenza,inj,Quad PF,6+ Mos 07/07/2013, 05/04/2014  . PFIZER SARS-COV-2 Vaccination 10/22/2019, 11/15/2019  . Pneumococcal Conjugate-13 08/21/2014  . Pneumococcal Polysaccharide-23 12/05/2012  . Pneumococcal-Unspecified 10/11/2007  . Td 03/09/2006  . Tdap 03/25/2016  . Zoster 03/31/2016    TDAP status: Up to date Flu Vaccine status: Up to date Pneumococcal vaccine status: Up to date Covid-19 vaccine status: Completed vaccines  Qualifies for Shingles Vaccine? Yes   Zostavax completed Yes   Shingrix Completed?: No.    Education has been provided regarding the importance of this vaccine. Patient has been advised to call insurance company to determine out of pocket expense if they have not yet received this vaccine. Advised may also receive vaccine at local pharmacy or Health Dept. Verbalized acceptance and understanding.  Screening Tests Health Maintenance  Topic Date Due  . OPHTHALMOLOGY EXAM  03/27/2020  . INFLUENZA VACCINE  03/31/2020  . PNA vac Low Risk Adult (2 of 2 - PPSV23)  03/16/2034 (Originally 12/05/2017)  . FOOT EXAM  05/08/2020  . HEMOGLOBIN A1C  09/11/2020  . COLONOSCOPY  06/10/2022  . TETANUS/TDAP  03/25/2026  . COVID-19 Vaccine  Completed  . Hepatitis C Screening  Completed    Health Maintenance  Health Maintenance Due  Topic Date Due  . OPHTHALMOLOGY EXAM   03/27/2020  . INFLUENZA VACCINE  03/31/2020    Colorectal cancer screening: Completed 06/10/2012. Repeat every 10 years  Lung Cancer Screening: (Low Dose CT Chest recommended if Age 39-80 years, 30 pack-year currently smoking OR have quit w/in 15years.) does not qualify.   Lung Cancer Screening Referral: N/A  Additional Screening:  Hepatitis C Screening: does qualify; Completed 03/25/2016  Vision Screening: Recommended annual ophthalmology exams for early detection of glaucoma and other disorders of the eye. Is the patient up to date with their annual eye exam?  Yes  Who is the provider or what is the name of the office in which the patient attends annual eye exams? Dr. Einar Gip  If pt is not established with a provider, would they like to be referred to a provider to establish care? No .   Dental Screening: Recommended annual dental exams for proper oral hygiene  Community Resource Referral / Chronic Care Management: CRR required this visit?  No   CCM required this visit?  No      Plan:     I have personally reviewed and noted the following in the patient's chart:   . Medical and social history . Use of alcohol, tobacco or illicit drugs  . Current medications and supplements . Functional ability and status . Nutritional status . Physical activity . Advanced directives . List of other physicians . Hospitalizations, surgeries, and ER visits in previous 12 months . Vitals . Screenings to include cognitive, depression, and falls . Referrals and appointments  In addition, I have reviewed and discussed with patient certain preventive protocols, quality metrics, and best practice recommendations. A written personalized care plan for preventive services as well as general preventive health recommendations were provided to patient.     Ofilia Neas, LPN   0/05/2724   Nurse Notes: Patient had some concerns of occassional chest tightness and chest pain that he states has  been going on for several months. Pains of tightness are not constant but come and go. Scheduled patient for an appointment on 04/29/2020 with PCP

## 2020-04-29 ENCOUNTER — Ambulatory Visit: Payer: Medicare Other | Admitting: Family Medicine

## 2020-04-29 DIAGNOSIS — L821 Other seborrheic keratosis: Secondary | ICD-10-CM | POA: Diagnosis not present

## 2020-04-29 DIAGNOSIS — D2261 Melanocytic nevi of right upper limb, including shoulder: Secondary | ICD-10-CM | POA: Diagnosis not present

## 2020-04-29 DIAGNOSIS — D692 Other nonthrombocytopenic purpura: Secondary | ICD-10-CM | POA: Diagnosis not present

## 2020-04-29 DIAGNOSIS — Z85828 Personal history of other malignant neoplasm of skin: Secondary | ICD-10-CM | POA: Diagnosis not present

## 2020-04-29 DIAGNOSIS — D2272 Melanocytic nevi of left lower limb, including hip: Secondary | ICD-10-CM | POA: Diagnosis not present

## 2020-05-02 ENCOUNTER — Other Ambulatory Visit: Payer: Self-pay

## 2020-05-03 ENCOUNTER — Telehealth: Payer: Medicare Other

## 2020-05-03 ENCOUNTER — Ambulatory Visit (INDEPENDENT_AMBULATORY_CARE_PROVIDER_SITE_OTHER): Payer: Medicare Other | Admitting: Family Medicine

## 2020-05-03 ENCOUNTER — Encounter: Payer: Self-pay | Admitting: Family Medicine

## 2020-05-03 VITALS — BP 110/64 | HR 63 | Temp 98.0°F | Ht 72.0 in | Wt 227.0 lb

## 2020-05-03 DIAGNOSIS — R079 Chest pain, unspecified: Secondary | ICD-10-CM | POA: Diagnosis not present

## 2020-05-03 MED ORDER — NITROGLYCERIN 0.4 MG SL SUBL
SUBLINGUAL_TABLET | SUBLINGUAL | 0 refills | Status: AC
Start: 2020-05-03 — End: ?

## 2020-05-03 NOTE — Progress Notes (Signed)
Established Patient Office Visit  Subjective:  Patient ID: Lucas Warren, male    DOB: September 27, 1945  Age: 74 y.o. MRN: [id number]  CC:  Chief Complaint  Patient presents with  . Chest Pain    pt c/o "chest tightness" sx   . Shortness of Breath    HPI Lucas Warren presents for chest pain.  Lucas Warren has history of hypertension, GERD, type 2 diabetes, dyslipidemia.  He states that he had mentioned last visit episode of chest pain though we have no recollection and no documentation.  He states over the past 3 to 4 weeks he has had a couple episodes at rest of chest pressure left chest and substernal which lasted about 30 minutes.  He has also had some decreased tolerance to activity and increased fatigue with activities in general.  He had some shortness of breath with activity as well.  He had one episode where he had some tingling sensation left arm associated with chest pain.  He had nuclear stress test back in 2017 which is unremarkable.  He takes aspirin but only once per week.  Non-smoker.  Does have family history of CAD in his father in his 40s and also paternal grandfather had CAD unknown age Patient has diabetes with last A1c 7.9%.  Past Medical History:  Diagnosis Date  . Allergy    seasonal  . Anxiety   . Chest pain, atypical   . Colon polyps    hyperplastic 2003  . DJD (degenerative joint disease)   . DM (diabetes mellitus) ([place])   . GERD (gastroesophageal reflux disease)   . History of headache   . Hypercholesteremia   . Psoriasis     Past Surgical History:  Procedure Laterality Date  . CATARACT EXTRACTION, BILATERAL Bilateral    Dr. Tommy Rainwater;   . INGUINAL HERNIA REPAIR  1989  . NASAL SEPTUM SURGERY  1989    Family History  Problem Relation Age of Onset  . Lung cancer Mother   . Alzheimer's disease Father   . Heart disease Father   . Leukemia Sister     Social History   Socioeconomic History  . Marital status: Divorced    Spouse name: carolyn  .  Number of children: Not on file  . Years of education: Not on file  . Highest education level: Not on file  Occupational History    Comment: Patent examiner  Tobacco Use  . Smoking status: Former Smoker    Packs/day: 2.00    Years: 20.00    Pack years: 40.00    Types: Cigarettes    Quit date: 08/31/1981    Years since quitting: 38.6  . Smokeless tobacco: Never Used  Vaping Use  . Vaping Use: Never used  Substance and Sexual Activity  . Alcohol use: Yes    Comment: rarely a beer  . Drug use: No  . Sexual activity: Not on file  Other Topics Concern  . Not on file  Social History Narrative  . Not on file   Social Determinants of Health   Financial Resource Strain: Low Risk   . Difficulty of Paying Living Expenses: Not hard at all  Food Insecurity: No Food Insecurity  . Worried About Charity fundraiser in the Last Year: Never true  . Ran Out of Food in the Last Year: Never true  Transportation Needs: No Transportation Needs  . Lack of Transportation (Medical): No  . Lack of Transportation (Non-Medical): No  Physical Activity:  Inactive  . Days of Exercise per Week: 0 days  . Minutes of Exercise per Session: 0 min  Stress: No Stress Concern Present  . Feeling of Stress : Not at all  Social Connections: Moderately Integrated  . Frequency of Communication with Friends and Family: More than three times a week  . Frequency of Social Gatherings with Friends and Family: Three times a week  . Attends Religious Services: More than 4 times per year  . Active Member of Clubs or Organizations: Yes  . Attends Archivist Meetings: More than 4 times per year  . Marital Status: Divorced  Human resources officer Violence: Not At Risk  . Fear of Current or Ex-Partner: No  . Emotionally Abused: No  . Physically Abused: No  . Sexually Abused: No    Outpatient Medications Prior to Visit  Medication Sig Dispense Refill  . aspirin 81 MG tablet 1 tablet twice per week    .  glimepiride (AMARYL) 4 MG tablet Take 1 tablet by mouth once daily with breakfast 90 tablet 3  . JARDIANCE 10 MG TABS tablet Take 1 tablet by mouth once daily 90 tablet 0  . Lancets (ONETOUCH DELICA PLUS [id number]) MISC USE 1  TO CHECK GLUCOSE ONCE DAILY 100 each 0  . lisinopril (ZESTRIL) 10 MG tablet Take 1 tablet by mouth once daily 90 tablet 0  . metFORMIN (GLUCOPHAGE) 500 MG tablet Take 2 tablets by mouth twice daily 360 tablet 0  . ONETOUCH ULTRA test strip USE 1 STRIP TO CHECK GLUCOSE ONCE DAILY 100 each 0  . pioglitazone (ACTOS) 45 MG tablet Take 1 tablet (45 mg total) by mouth daily. 90 tablet 3  . pravastatin (PRAVACHOL) 40 MG tablet Take 1 tablet (40 mg total) by mouth daily. 90 tablet 3  . sildenafil (REVATIO) 20 MG tablet TAKE 2 TO 5 TABLETS BY MOUTH AS DIRECTED 90 tablet 1  . sitaGLIPtin (JANUVIA) 100 MG tablet Take 1 tablet (100 mg total) by mouth daily. 90 tablet 3  . vitamin B-12 (CYANOCOBALAMIN) 1000 MCG tablet Take 1,000 mcg by mouth daily.      No facility-administered medications prior to visit.    Allergies  Allergen Reactions  . Lisinopril Cough  . Erythromycin     REACTION: abd pain  . Trulicity [Dulaglutide] Nausea Only    ROS Review of Systems  Constitutional: Positive for fatigue. Negative for chills and fever.  Respiratory: Negative for cough.   Cardiovascular: Positive for chest pain.  Gastrointestinal: Negative for abdominal pain, nausea and vomiting.      Objective:    Physical Exam Vitals reviewed.  Constitutional:      Appearance: He is well-developed.  Cardiovascular:     Rate and Rhythm: Normal rate and regular rhythm.     Heart sounds: Normal heart sounds.  Pulmonary:     Breath sounds: Normal breath sounds. No decreased breath sounds or wheezing.  Abdominal:     Palpations: Abdomen is soft.     Tenderness: There is no abdominal tenderness.  Musculoskeletal:     Cervical back: Neck supple.     Right lower leg: No edema.     Left  lower leg: No edema.  Neurological:     Mental Status: He is alert.     BP 110/64   Pulse 63   Temp 98 F (36.7 C) (Oral)   Ht 6' (1.829 m)   Wt 227 lb (103 kg)   SpO2 97%   BMI 30.79 kg/m  Wt Readings from Last 3 Encounters:  05/03/20 227 lb (103 kg)  03/11/20 228 lb 11.2 oz (103.7 kg)  11/06/19 224 lb 12.8 oz (102 kg)     Health Maintenance Due  Topic Date Due  . OPHTHALMOLOGY EXAM  03/27/2020  . INFLUENZA VACCINE  03/31/2020    There are no preventive care reminders to display for this patient.  Lab Results  Component Value Date   TSH 0.86 07/24/2015   Lab Results  Component Value Date   WBC 9.1 11/11/2018   HGB 13.9 11/11/2018   HCT 42.0 11/11/2018   MCV 85.5 11/11/2018   PLT 222.0 11/11/2018   Lab Results  Component Value Date   NA 138 08/15/2019   K 4.2 08/15/2019   CO2 22 08/15/2019   GLUCOSE 109 (H) 08/15/2019   BUN 28 (H) 08/15/2019   CREATININE 1.61 (H) 08/15/2019   BILITOT 0.5 08/07/2019   ALKPHOS 51 08/07/2019   AST 12 08/07/2019   ALT 7 08/07/2019   PROT 7.0 08/07/2019   ALBUMIN 4.5 08/07/2019   CALCIUM 9.1 08/15/2019   GFR 42.45 (L) 08/15/2019   Lab Results  Component Value Date   CHOL 197 08/07/2019   Lab Results  Component Value Date   HDL 46.80 08/07/2019   Lab Results  Component Value Date   LDLCALC 119 (H) 08/07/2019   Lab Results  Component Value Date   TRIG 155.0 (H) 08/07/2019   Lab Results  Component Value Date   CHOLHDL 4 08/07/2019   Lab Results  Component Value Date   HGBA1C 7.9 (A) 03/11/2020      Assessment & Plan:   Problem List Items Addressed This Visit    None    Visit Diagnoses    Chest pain, unspecified type    -  Primary   Relevant Orders   EKG 12-Lead (Completed)   Ambulatory referral to Cardiology    Patient has symptoms concerning for possible angina.  He does have moderate to high risk.  EKG today shows sinus rhythm with no acute change  -Set up urgent cardiology referral -Avoid  heavy exertional activity until further assessed -Start back aspirin 81 mg daily -Wrote for sublingual nitroglycerin 0.4 mg take 1 every 5 minutes up to 3 doses as needed for chest pain.  He knows not to take drugs such as Sildenafil or Revatio with nitroglycerin.   -He knows to call 911 if he has any severe recurrent chest pains or persistent chest symptoms over the weekend -We will need tighter control of lipids.  He is currently on pravastatin but really need to look at more potent statin such as Lipitor or Crestor.  Will defer until after further work-up  Meds ordered this encounter  Medications  . nitroGLYCERIN (NITROSTAT) 0.4 MG SL tablet    Sig: Take one tablet sublingual as needed for chest pain up to three doses as needed.    Dispense:  20 tablet    Refill:  0    Follow-up: No follow-ups on file.    [name], MD

## 2020-05-03 NOTE — Patient Instructions (Addendum)
Start back on the aspirin one daily  Avoid heavy exertion until further checked  I am setting up Cardiology referral  Take the sublingual nitroglycerin if you have recurrent chest pressure and call 911.

## 2020-05-04 ENCOUNTER — Other Ambulatory Visit: Payer: Self-pay | Admitting: Family Medicine

## 2020-05-07 ENCOUNTER — Ambulatory Visit: Payer: Medicare Other | Admitting: Pharmacist

## 2020-05-07 ENCOUNTER — Encounter: Payer: Self-pay | Admitting: Interventional Cardiology

## 2020-05-07 ENCOUNTER — Ambulatory Visit: Payer: Medicare Other | Admitting: Interventional Cardiology

## 2020-05-07 ENCOUNTER — Other Ambulatory Visit: Payer: Self-pay

## 2020-05-07 VITALS — BP 120/60 | HR 60 | Ht 72.0 in | Wt 228.0 lb

## 2020-05-07 DIAGNOSIS — R072 Precordial pain: Secondary | ICD-10-CM | POA: Diagnosis not present

## 2020-05-07 DIAGNOSIS — N1831 Chronic kidney disease, stage 3a: Secondary | ICD-10-CM

## 2020-05-07 DIAGNOSIS — I10 Essential (primary) hypertension: Secondary | ICD-10-CM

## 2020-05-07 DIAGNOSIS — E1159 Type 2 diabetes mellitus with other circulatory complications: Secondary | ICD-10-CM

## 2020-05-07 DIAGNOSIS — E1165 Type 2 diabetes mellitus with hyperglycemia: Secondary | ICD-10-CM

## 2020-05-07 DIAGNOSIS — E782 Mixed hyperlipidemia: Secondary | ICD-10-CM | POA: Diagnosis not present

## 2020-05-07 MED ORDER — ROSUVASTATIN CALCIUM 20 MG PO TABS
20.0000 mg | ORAL_TABLET | Freq: Every day | ORAL | 3 refills | Status: DC
Start: 2020-05-07 — End: 2020-08-02

## 2020-05-07 MED ORDER — ISOSORBIDE MONONITRATE ER 30 MG PO TB24
30.0000 mg | ORAL_TABLET | Freq: Every day | ORAL | 3 refills | Status: DC
Start: 2020-05-07 — End: 2020-05-20

## 2020-05-07 NOTE — H&P (View-Only) (Signed)
Cardiology Office Note   Date:  05/07/2020   ID:  Lucas Warren, DOB Jan 13, 1946, MRN [id number]  PCP:  Lucas Post, MD    No chief complaint on file.  Precordial chest pain  Wt Readings from Last 3 Encounters:  05/07/20 228 lb (103.4 kg)  05/03/20 227 lb (103 kg)  03/11/20 228 lb 11.2 oz (103.7 kg)       History of Present Illness: Lucas Warren is a 74 y.o. male who is being seen today for the evaluation of chest pain, left arm pain at the request of Lucas Post, MD.  A few weeks ago, he had an episode of chest and arm pain while in his recliner watching TV.  It lasted about 30 minutes.  Not sweating or nauseated.  He has had chest tightness with a deep breath at times.  Not related to activity.  He has had some SHOB with walking.    He plays golf once a week, and has not had symptoms while walking on the golf course.  He does not have stairs in his house.     2017 stress test showed: "Nuclear stress EF: 58%.  Blood pressure demonstrated a blunted response to exercise but was very elevated during recovery.  There was no ST segment deviation noted during stress.  No T wave inversion was noted during stress.  Defect 1: There is a small defect of moderate severity present in the apical inferior and apical lateral location. Likely due to diaphragmatic attenuation but could be consistent with prior infarct.  This is a low risk study.  The left ventricular ejection fraction is normal (55-65%)."   Denies : exertional Chest pain. Dizziness. Leg edema. Nitroglycerin use. Orthopnea. Palpitations. Paroxysmal nocturnal dyspnea.  Syncope.     Past Medical History:  Diagnosis Date  . Allergy    seasonal  . Anxiety   . Chest pain, atypical   . Colon polyps    hyperplastic 2003  . DJD (degenerative joint disease)   . DM (diabetes mellitus) ([place])   . GERD (gastroesophageal reflux disease)   . History of headache   . Hypercholesteremia   . Psoriasis      Past Surgical History:  Procedure Laterality Date  . CATARACT EXTRACTION, BILATERAL Bilateral    Dr. Tommy Warren;   . INGUINAL HERNIA REPAIR  1989  . NASAL SEPTUM SURGERY  1989     Current Outpatient Medications  Medication Sig Dispense Refill  . aspirin 81 MG tablet 1 tablet twice per week    . glimepiride (AMARYL) 4 MG tablet Take 1 tablet by mouth once daily with breakfast 90 tablet 3  . JARDIANCE 10 MG TABS tablet Take 1 tablet by mouth once daily 90 tablet 0  . Lancets (ONETOUCH DELICA PLUS [id number]) MISC USE 1  TO CHECK GLUCOSE ONCE DAILY 100 each 0  . lisinopril (ZESTRIL) 10 MG tablet Take 1 tablet by mouth once daily 90 tablet 0  . metFORMIN (GLUCOPHAGE) 500 MG tablet Take 2 tablets by mouth twice daily 360 tablet 0  . nitroGLYCERIN (NITROSTAT) 0.4 MG SL tablet Take one tablet sublingual as needed for chest pain up to three doses as needed. 20 tablet 0  . ONETOUCH ULTRA test strip USE 1 STRIP TO CHECK GLUCOSE ONCE DAILY 100 each 0  . pioglitazone (ACTOS) 45 MG tablet Take 1 tablet (45 mg total) by mouth daily. 90 tablet 3  . pravastatin (PRAVACHOL) 40 MG tablet Take 1 tablet (40  mg total) by mouth daily. 90 tablet 3  . sitaGLIPtin (JANUVIA) 100 MG tablet Take 1 tablet (100 mg total) by mouth daily. 90 tablet 3  . vitamin B-12 (CYANOCOBALAMIN) 1000 MCG tablet Take 1,000 mcg by mouth daily.      No current facility-administered medications for this visit.    Allergies:   Lisinopril, Erythromycin, and Trulicity [dulaglutide]    Social History:  The patient  reports that he quit smoking about 38 years ago. His smoking use included cigarettes. He has a 40.00 pack-year smoking history. He has never used smokeless tobacco. He reports current alcohol use. He reports that he does not use drugs.   Family History:  The patient's family history includes Alzheimer's disease in his father; Heart disease in his father; Leukemia in his sister; Lung cancer in his mother.    ROS:  Please  see the history of present illness.   Otherwise, review of systems are positive for DOE.   All other systems are reviewed and negative.    PHYSICAL EXAM: VS:  BP 120/60   Pulse 60   Ht 6' (1.829 m)   Wt 228 lb (103.4 kg)   SpO2 99%   BMI 30.92 kg/m  , BMI Body mass index is 30.92 kg/m. GEN: Well nourished, well developed, in no acute distress  HEENT: normal  Neck: no JVD, carotid bruits, or masses Cardiac: RRR; no murmurs, rubs, or gallops,no edema  Respiratory:  clear to auscultation bilaterally, normal work of breathing GI: soft, nontender, nondistended, + BS MS: no deformity or atrophy  Skin: warm and dry, no rash Neuro:  Strength and sensation are intact Psych: euthymic mood, full affect   EKG:   The ekg ordered 05/03/20 demonstrates NSR, no ST changes   Recent Labs: 08/07/2019: ALT 7 08/15/2019: BUN 28; Creatinine, Ser 1.61; Potassium 4.2; Sodium 138   Lipid Panel    Component Value Date/Time   CHOL 197 08/07/2019 0848   TRIG 155.0 (H) 08/07/2019 0848   HDL 46.80 08/07/2019 0848   CHOLHDL 4 08/07/2019 0848   VLDL 31.0 08/07/2019 0848   LDLCALC 119 (H) 08/07/2019 0848     Other studies Reviewed: Additional studies/ records that were reviewed today with results demonstrating: LDL 119 in 2020.   ASSESSMENT AND PLAN:  1. Chest pain: We discussed cath vs. Coronary CTA.  He has SL NTG that he has not had to use.  Start Imdur 30 mg daily.  If he has persistent sx, would plan for cath. RIsks and benefits explained and questions were answered. 2. DM: A1C 7.9 in 2020.  Whole food plant based diet recommended.  On lisinopril for this. 3. CRI: This will limit ability to do CTA coronaries. Avoid nephrotoxins.  4. Hyperlipidemia: He was on simvastatin, but he stopped it in the past.  Stop pravastatin. Start rosuvastatin 20 mg daily for higher potency statin given DM and LDL above target.    Current medicines are reviewed at length with the patient today.  The patient  concerns regarding his medicines were addressed.  The following changes have been made:  Add Imdur  Labs/ tests ordered today include:  No orders of the defined types were placed in this encounter.   Recommend 150 minutes/week of aerobic exercise Low fat, low carb, high fiber diet recommended  Disposition:   FU in 1 week, patient to send message   Signed, [name], MD  05/07/2020 10:33 AM    [facility] [address]  [address] [place], [place]  [address] Phone: [phone]; Fax: [phone]

## 2020-05-07 NOTE — Progress Notes (Signed)
Cardiology Office Note   Date:  05/07/2020   ID:  Lucas Warren, DOB 1946-02-14, MRN [id number]  PCP:  [name], MD    No chief complaint on file.  Precordial chest pain  Wt Readings from Last 3 Encounters:  05/07/20 228 lb (103.4 kg)  05/03/20 227 lb (103 kg)  03/11/20 228 lb 11.2 oz (103.7 kg)       History of Present Illness: Lucas Warren is a 74 y.o. male who is being seen today for the evaluation of chest pain, left arm pain at the request of [name], MD.  A few weeks ago, he had an episode of chest and arm pain while in his recliner watching TV.  It lasted about 30 minutes.  Not sweating or nauseated.  He has had chest tightness with a deep breath at times.  Not related to activity.  He has had some SHOB with walking.    He plays golf once a week, and has not had symptoms while walking on the golf course.  He does not have stairs in his house.     2017 stress test showed: "Nuclear stress EF: 58%.  Blood pressure demonstrated a blunted response to exercise but was very elevated during recovery.  There was no ST segment deviation noted during stress.  No T wave inversion was noted during stress.  Defect 1: There is a small defect of moderate severity present in the apical inferior and apical lateral location. Likely due to diaphragmatic attenuation but could be consistent with prior infarct.  This is a low risk study.  The left ventricular ejection fraction is normal (55-65%)."   Denies : exertional Chest pain. Dizziness. Leg edema. Nitroglycerin use. Orthopnea. Palpitations. Paroxysmal nocturnal dyspnea.  Syncope.     Past Medical History:  Diagnosis Date  . Allergy    seasonal  . Anxiety   . Chest pain, atypical   . Colon polyps    hyperplastic 2003  . DJD (degenerative joint disease)   . DM (diabetes mellitus) ([place])   . GERD (gastroesophageal reflux disease)   . History of headache   . Hypercholesteremia   . Psoriasis      Past Surgical History:  Procedure Laterality Date  . CATARACT EXTRACTION, BILATERAL Bilateral    Dr. Tommy Rainwater;   . INGUINAL HERNIA REPAIR  1989  . NASAL SEPTUM SURGERY  1989     Current Outpatient Medications  Medication Sig Dispense Refill  . aspirin 81 MG tablet 1 tablet twice per week    . glimepiride (AMARYL) 4 MG tablet Take 1 tablet by mouth once daily with breakfast 90 tablet 3  . JARDIANCE 10 MG TABS tablet Take 1 tablet by mouth once daily 90 tablet 0  . Lancets (ONETOUCH DELICA PLUS [id number]) MISC USE 1  TO CHECK GLUCOSE ONCE DAILY 100 each 0  . lisinopril (ZESTRIL) 10 MG tablet Take 1 tablet by mouth once daily 90 tablet 0  . metFORMIN (GLUCOPHAGE) 500 MG tablet Take 2 tablets by mouth twice daily 360 tablet 0  . nitroGLYCERIN (NITROSTAT) 0.4 MG SL tablet Take one tablet sublingual as needed for chest pain up to three doses as needed. 20 tablet 0  . ONETOUCH ULTRA test strip USE 1 STRIP TO CHECK GLUCOSE ONCE DAILY 100 each 0  . pioglitazone (ACTOS) 45 MG tablet Take 1 tablet (45 mg total) by mouth daily. 90 tablet 3  . pravastatin (PRAVACHOL) 40 MG tablet Take 1 tablet (40  mg total) by mouth daily. 90 tablet 3  . sitaGLIPtin (JANUVIA) 100 MG tablet Take 1 tablet (100 mg total) by mouth daily. 90 tablet 3  . vitamin B-12 (CYANOCOBALAMIN) 1000 MCG tablet Take 1,000 mcg by mouth daily.      No current facility-administered medications for this visit.    Allergies:   Lisinopril, Erythromycin, and Trulicity [dulaglutide]    Social History:  The patient  reports that he quit smoking about 38 years ago. His smoking use included cigarettes. He has a 40.00 pack-year smoking history. He has never used smokeless tobacco. He reports current alcohol use. He reports that he does not use drugs.   Family History:  The patient's family history includes Alzheimer's disease in his father; Heart disease in his father; Leukemia in his sister; Lung cancer in his mother.    ROS:  Please  see the history of present illness.   Otherwise, review of systems are positive for DOE.   All other systems are reviewed and negative.    PHYSICAL EXAM: VS:  BP 120/60   Pulse 60   Ht 6' (1.829 m)   Wt 228 lb (103.4 kg)   SpO2 99%   BMI 30.92 kg/m  , BMI Body mass index is 30.92 kg/m. GEN: Well nourished, well developed, in no acute distress  HEENT: normal  Neck: no JVD, carotid bruits, or masses Cardiac: RRR; no murmurs, rubs, or gallops,no edema  Respiratory:  clear to auscultation bilaterally, normal work of breathing GI: soft, nontender, nondistended, + BS MS: no deformity or atrophy  Skin: warm and dry, no rash Neuro:  Strength and sensation are intact Psych: euthymic mood, full affect   EKG:   The ekg ordered 05/03/20 demonstrates NSR, no ST changes   Recent Labs: 08/07/2019: ALT 7 08/15/2019: BUN 28; Creatinine, Ser 1.61; Potassium 4.2; Sodium 138   Lipid Panel    Component Value Date/Time   CHOL 197 08/07/2019 0848   TRIG 155.0 (H) 08/07/2019 0848   HDL 46.80 08/07/2019 0848   CHOLHDL 4 08/07/2019 0848   VLDL 31.0 08/07/2019 0848   LDLCALC 119 (H) 08/07/2019 0848     Other studies Reviewed: Additional studies/ records that were reviewed today with results demonstrating: LDL 119 in 2020.   ASSESSMENT AND PLAN:  1. Chest pain: We discussed cath vs. Coronary CTA.  He has SL NTG that he has not had to use.  Start Imdur 30 mg daily.  If he has persistent sx, would plan for cath. RIsks and benefits explained and questions were answered. 2. DM: A1C 7.9 in 2020.  Whole food plant based diet recommended.  On lisinopril for this. 3. CRI: This will limit ability to do CTA coronaries. Avoid nephrotoxins.  4. Hyperlipidemia: He was on simvastatin, but he stopped it in the past.  Stop pravastatin. Start rosuvastatin 20 mg daily for higher potency statin given DM and LDL above target.    Current medicines are reviewed at length with the patient today.  The patient  concerns regarding his medicines were addressed.  The following changes have been made:  Add Imdur  Labs/ tests ordered today include:  No orders of the defined types were placed in this encounter.   Recommend 150 minutes/week of aerobic exercise Low fat, low carb, high fiber diet recommended  Disposition:   FU in 1 week, patient to send message   Signed, [name], MD  05/07/2020 10:33 AM    [facility] [address]  [address] [place], [place]  [address] Phone: [phone]; Fax: [phone]

## 2020-05-07 NOTE — Chronic Care Management (AMB) (Signed)
Chronic Care Management Pharmacy  Name: Lucas Warren  MRN: [id number] DOB: 06-Mar-1946  Initial Questions: 1. Have you seen any other providers since your last visit? Yes -cardiology 2. Any changes in your medicines or health? Yes - cardiologist switched pravastatin to rosuvastatin and started isosorbide  Chief Complaint/ HPI Lucas Warren,  74 y.o. , male presents for their Follow-Up CCM visit with the clinical pharmacist via telephone due to COVID-19 Pandemic.  Patient stated he was approved for both Jardiance and Januvia patient assistance.   PCP : Lucas Post, MD  Their chronic conditions include: DM, HTN, HLD, CKD stage 3  Office Visits: 05/03/2020 - Lucas Littler, MD - Patient presented for chest pressure and shortness of breath for the past 3-4 weeks. Restarted aspirin 81 mg daily and started PRN nitroglycerin SL.  04/26/20 Lucas Neas, LPN - Patient presented for AWV. No medication changes were made.  03/11/2020- Lucas Littler, MD- Patient presented for office visit for diabetes. A1c: 7.9% (worsened). Patient reluctant to add medications. Patient opted for 3 month trial of addition weight loss. Patient to follow up in 3 months.   11/06/2019- Lucas Littler, MD- Patient presented for office visit for follow up. Patient to continue current medications. Patient was encouraged to step up exercise and aim to lose weight. Plan for 4 month follow up. No additional DM medications added due to A1c improvement.   08/07/2019- Lucas Littler, MD- Patient presented for office visit for follow up. Patient needing refills for DM medications. Patient reluctant to start additional DM medications. Patient to step up exercise such as walking. Recommend 3 month follow up. Labs to be checked: lipid, hepatic, BMET.   Consult visits: 05/07/20 Lucas Grooms, MD (cardiology): Patient presented with chest pressure and initial work up. Switched pravastatin 40 mg to rosuvastatin 20 mg and  started isosorbide mononitrate 30 mg daily.   Medications: Outpatient Encounter Medications as of 05/07/2020  Medication Sig  . aspirin 81 MG tablet Take 1 tablet by mouth daily.  Marland Kitchen glimepiride (AMARYL) 4 MG tablet Take 1 tablet by mouth once daily with breakfast  . isosorbide mononitrate (IMDUR) 30 MG 24 hr tablet Take 1 tablet (30 mg total) by mouth daily.  Marland Kitchen JARDIANCE 10 MG TABS tablet Take 1 tablet by mouth once daily  . Lancets (ONETOUCH DELICA PLUS [id number]) MISC USE 1  TO CHECK GLUCOSE ONCE DAILY  . lisinopril (ZESTRIL) 10 MG tablet Take 1 tablet by mouth once daily  . metFORMIN (GLUCOPHAGE) 500 MG tablet Take 2 tablets by mouth twice daily  . nitroGLYCERIN (NITROSTAT) 0.4 MG SL tablet Take one tablet sublingual as needed for chest pain up to three doses as needed.  Lucas Warren ULTRA test strip USE 1 STRIP TO CHECK GLUCOSE ONCE DAILY  . pioglitazone (ACTOS) 45 MG tablet Take 1 tablet (45 mg total) by mouth daily.  . rosuvastatin (CRESTOR) 20 MG tablet Take 1 tablet (20 mg total) by mouth daily.  . sitaGLIPtin (JANUVIA) 100 MG tablet Take 1 tablet (100 mg total) by mouth daily.  . vitamin B-12 (CYANOCOBALAMIN) 1000 MCG tablet Take 1,000 mcg by mouth daily.   . [DISCONTINUED] pravastatin (PRAVACHOL) 40 MG tablet Take 1 tablet (40 mg total) by mouth daily.   No facility-administered encounter medications on file as of 05/07/2020.     Current Diagnosis/Assessment:  Goals Addressed            This Visit's Progress   . Pharmacy Care Plan  CARE PLAN ENTRY  Current Barriers:  . Chronic Disease Management support, education, and care coordination needs related to Hypertension, Hyperlipidemia, Diabetes, and Chronic Kidney Disease   Hypertension . Pharmacist Clinical Goal(s): o Over the next 90 days, patient will work with PharmD and providers to maintain BP goal <130/80 . Current regimen:  o Lisinopril 10mg , 1 tablet once daily . Interventions: o We discussed fall risk  precautions and taking time before taking first step after standing up. . Patient self care activities - Over the next 90 days, patient will: o Check BP as directed, document, and provide at future appointments o Ensure daily salt intake < 2300 mg/day  Hyperlipidemia . Pharmacist Clinical Goal(s): o Over the next 90 days, patient will work with PharmD and providers to achieve LDL goal < 100 . Current regimen:  o Rosuvastatin 20mg , 1 tablet once daily . Patient self care activities - Over the next 90 days, patient will: o Continue current medications.   Diabetes . Pharmacist Clinical Goal(s): o Over the next 90 days, patient will work with PharmD and providers to achieve A1c goal <7% . Current regimen:   Glimepiride 4mg , 1 tablet once daily with breakfast  Jardiance 10mg , 1 tablet once daily  Metformin 500mg , 2 tablets twice daily  Pioglitazone (Actos) 45mg , 1 tablet once daily  sitagliptin (Januvia) 100mg , 1 tablet once daily . Interventions: o We discussed alternating times when blood sugars are checking in the day.  . Patient self care activities - Over the next 90 days, patient will: o Check blood sugar once daily, document, and provide at future appointments o Contact provider with any episodes of hypoglycemia  Chronic kidney disease . Pharmacist Clinical Goal(s) o Over the next 90 days, patient will work with PharmD and providers to maintain kidney function.  . Patient self care activities o Patient will continue to maintain proper hydration and avoid medications like NSAIDs (ibuprofen, naproxen, etc.).   Medication management . Pharmacist Clinical Goal(s): o Over the next 90 days, patient will work with PharmD and providers to maintain optimal medication adherence . Current pharmacy: [facility] . Interventions o Comprehensive medication review performed. o Continue current medication management strategy . Patient self care activities - Over the next 90 days, patient  will: o Take medications as prescribed o Report any questions or concerns to PharmD and/or provider(s)  Please see past updates related to this goal by clicking on the "Past Updates" button in the selected goal        SDOH Interventions     Most Recent Value  SDOH Interventions  Financial Strain Interventions Intervention Not Indicated  Transportation Interventions Intervention Not Indicated        Diabetes   Recent Relevant Labs: Lab Results  Component Value Date/Time   HGBA1C 7.9 (A) 03/11/2020 08:24 AM   HGBA1C 7.2 (A) 11/06/2019 08:33 AM   HGBA1C 8.3 (H) 12/25/2015 08:39 AM   HGBA1C 8.4 10/14/2015 12:00 AM   HGBA1C 7.3 (H) 05/22/2015 09:53 AM   MICROALBUR 4.3 (H) 01/10/2018 08:54 AM   MICROALBUR 2.1 (H) 02/02/2014 09:40 AM    Checking BG: Daily  Recent FBS Readings: Reports BGs have been around the same as last visit.   Previous FBS readings:   103, 99, 88, 137, 99, 105, 111, 115, 157 (dessert late at night)  Recent PPBG readings: < 180  Patient has failed these meds in past: Trulicity (nausea), Onglyza (insurance formulary)    Patient is currently uncontrolled on the following medications:  Glimepiride 4mg , 1 tablet once daily with breakfast  Empagliflozin (Jardiance) 10mg , 1 tablet once daily  Metformin 500mg , 2 tablets twice daily  Pioglitazone (Actos) 45mg , 1 tablet once daily  Sitagliptin (Januvia) 100mg , 1 tablet once daily   Last diabetic Eye exam:  Lab Results  Component Value Date/Time   HMDIABEYEEXA No Retinopathy 02/28/2018 12:00 AM    Last diabetic Foot exam:  Lab Results  Component Value Date/Time   HMDIABFOOTEX normal 03/25/2016 12:00 AM    We discussed: diet and exercise extensively (food options for breakfast and lunch are high in carbohydrates.)   Plan Continue current medications  Recommend to continue checking blood glucose daily and alternating times of the day to see blood sugars at another time of day. May be a good CGM  candidate if patient does not check BGs throughout the day.  Hypertension   Denies persistent dizziness/lightheadedness.   Office blood pressures are  BP Readings from Last 3 Encounters:  05/07/20 120/60  05/03/20 110/64  03/11/20 118/72   Patient has failed these meds in the past: none  Patient checks BP at home does not check at home   Patient home BP readings are ranging: NA  Patient is controlled on:   Lisinopril 10mg , 1 tablet once daily  Isosorbide mononitrate 30 mg, 1 tablet once daily  We discussed fall risk precautions and taking time before taking first step after standing up.  Plan Continue current medications  Recommended for patient to start checking blood pressure at home and recording.  Hyperlipidemia   Lipid Panel     Component Value Date/Time   CHOL 197 08/07/2019 0848   TRIG 155.0 (H) 08/07/2019 0848   HDL 46.80 08/07/2019 0848   LDLCALC 119 (H) 08/07/2019 0848    The 10-year ASCVD risk score Mikey Bussing DC Jr., et al., 2013) is: 37.8%   Values used to calculate the score:     Age: 32 years     Sex: Male     Is Non-Hispanic African American: No     Diabetic: Yes     Tobacco smoker: No     Systolic Blood Pressure: 027 mmHg     Is BP treated: Yes     HDL Cholesterol: 46.8 mg/dL     Total Cholesterol: 197 mg/dL   Patient has failed these meds in past: rosuvastatin (elevated BGs), simvastatin (myalgia)  Patient is currently uncontrolled on the following medications:  . Rosuvastatin 20mg , 1 tablet once daily  We discussed:  diet and exercise extensively (see above).   Plan Continue current medications  Recommend repeat lipid panel in 8-12 weeks.  Primary prevention ASCVD   Patient is currently controlled on the following medications:   Aspirin 81mg , 1 tablet daily  Plan Continue current medications  OTC/ supplements  Patient is currently on the following medications:  Marland Kitchen Vitamin B12 1078mcg, 1 tablet once daily    Plan Continue  current medications   CKD, Stage 3   Kidney Function Lab Results  Component Value Date/Time   CREATININE 1.61 (H) 08/15/2019 07:32 AM   CREATININE 1.52 (H) 08/07/2019 08:48 AM   GFR 42.45 (L) 08/15/2019 07:32 AM   GFRNONAA 95.70 04/04/2010 09:56 AM   GFRAA 98 10/03/2008 11:01 AM   Plan Continue to monitor and adjust medications as needed.  Repeat CMP to determine if dose adjustments are necessary.  Medication Management   Pt uses [facility] for all medications Uses pill box? No - patient has his own system  Plan  Utilize [facility] for medication synchronization, packaging and delivery  Verbal consent obtained for [facility] enhanced pharmacy services (medication synchronization, adherence packaging, delivery coordination). A medication sync plan was created to allow patient to get all medications delivered once every 30 to 90 days per patient preference. Patient understands they have freedom to choose pharmacy and clinical pharmacist will coordinate care between all prescribers and [facility].   Follow up: 3 month phone visit  [name], PharmD Clinical Pharmacist [place] [facility] [place] [phone]

## 2020-05-07 NOTE — Patient Instructions (Signed)
Medication Instructions:  Your physician has recommended you make the following change in your medication:   1. STOP: pravastatin   2. START: rosuvastatin (crestor) 20 mg tablet: Take 1 tablet by mouth once a day  3. START: isosorbide mononitrate (imdur) 30 mg tablet: Take 1 tablet by mouth once a day  4. TAKE Aspirin 81 mg once a day  *If you need a refill on your cardiac medications before your next appointment, please call your pharmacy*   Lab Work: None  If you have labs (blood work) drawn today and your tests are completely normal, you will receive your results only by: Marland Kitchen MyChart Message (if you have MyChart) OR . A paper copy in the mail If you have any lab test that is abnormal or we need to change your treatment, we will call you to review the results.   Testing/Procedures: None   Follow-Up: Follow up with Dr. Irish Lack on 08/08/20 9:20 AM   Other Instructions Send [place] a message to let us know how you are feeling next week

## 2020-05-09 NOTE — Patient Instructions (Signed)
Visit Information  Goals Addressed            This Visit's Progress   . Pharmacy Care Plan       CARE PLAN ENTRY  Current Barriers:  . Chronic Disease Management support, education, and care coordination needs related to Hypertension, Hyperlipidemia, Diabetes, and Chronic Kidney Disease   Hypertension . Pharmacist Clinical Goal(s): o Over the next 90 days, patient will work with PharmD and providers to maintain BP goal <130/80 . Current regimen:  o Lisinopril 10mg , 1 tablet once daily . Interventions: o We discussed fall risk precautions and taking time before taking first step after standing up. . Patient self care activities - Over the next 90 days, patient will: o Check BP as directed, document, and provide at future appointments o Ensure daily salt intake < 2300 mg/day  Hyperlipidemia . Pharmacist Clinical Goal(s): o Over the next 90 days, patient will work with PharmD and providers to achieve LDL goal < 100 . Current regimen:  o Rosuvastatin 20mg , 1 tablet once daily . Patient self care activities - Over the next 90 days, patient will: o Continue current medications.   Diabetes . Pharmacist Clinical Goal(s): o Over the next 90 days, patient will work with PharmD and providers to achieve A1c goal <7% . Current regimen:   Glimepiride 4mg , 1 tablet once daily with breakfast  Jardiance 10mg , 1 tablet once daily  Metformin 500mg , 2 tablets twice daily  Pioglitazone (Actos) 45mg , 1 tablet once daily  sitagliptin (Januvia) 100mg , 1 tablet once daily . Interventions: o We discussed alternating times when blood sugars are checking in the day.  . Patient self care activities - Over the next 90 days, patient will: o Check blood sugar once daily, document, and provide at future appointments o Contact provider with any episodes of hypoglycemia  Chronic kidney disease . Pharmacist Clinical Goal(s) o Over the next 90 days, patient will work with PharmD and providers to  maintain kidney function.  . Patient self care activities o Patient will continue to maintain proper hydration and avoid medications like NSAIDs (ibuprofen, naproxen, etc.).   Medication management . Pharmacist Clinical Goal(s): o Over the next 90 days, patient will work with PharmD and providers to maintain optimal medication adherence . Current pharmacy: [facility] . Interventions o Comprehensive medication review performed. o Continue current medication management strategy . Patient self care activities - Over the next 90 days, patient will: o Take medications as prescribed o Report any questions or concerns to PharmD and/or provider(s)  Please see past updates related to this goal by clicking on the "Past Updates" button in the selected goal         The patient verbalized understanding of instructions provided today and declined a print copy of patient instruction materials.   The pharmacy team will reach out to the patient again over the next 14 days.   [name], PharmD Clinical Pharmacist [place] [facility] [place]

## 2020-05-10 ENCOUNTER — Other Ambulatory Visit: Payer: Self-pay | Admitting: Family Medicine

## 2020-05-10 MED ORDER — ONETOUCH DELICA PLUS LANCET33G MISC: 4 refills | Status: DC

## 2020-05-10 NOTE — Telephone Encounter (Signed)
Pt said he is still waiting on his proscription for his   Lancets (ONETOUCH DELICA PLUS [id number]) [place]   Please send it to   [facility] ([place] [place] - [address]  [address] [facility] ([place]) [place] [address]  Phone:  [phone] Fax:  [phone]

## 2020-05-10 NOTE — Telephone Encounter (Signed)
Rx sent in

## 2020-05-13 ENCOUNTER — Telehealth: Payer: Self-pay | Admitting: Pharmacist

## 2020-05-13 NOTE — Progress Notes (Addendum)
Chronic Care Management Pharmacy Assistant   Name: Lucas Warren  MRN: [id number] DOB: 1946/01/18  Reason for Encounter: [name]   PCP : [name], MD  Allergies:   Allergies  Allergen Reactions  . Lisinopril Cough  . Erythromycin     REACTION: abd pain  . Trulicity [Dulaglutide] Nausea Only    Medications: Outpatient Encounter Medications as of 05/13/2020  Medication Sig  . aspirin 81 MG tablet Take 1 tablet by mouth daily.  Marland Kitchen glimepiride (AMARYL) 4 MG tablet Take 1 tablet by mouth once daily with breakfast  . isosorbide mononitrate (IMDUR) 30 MG 24 hr tablet Take 1 tablet (30 mg total) by mouth daily.  Marland Kitchen JARDIANCE 10 MG TABS tablet Take 1 tablet by mouth once daily  . Lancets (ONETOUCH DELICA PLUS [id number]) MISC USE 1  TO CHECK GLUCOSE ONCE DAILY  . Lancets (ONETOUCH DELICA PLUS [id number]) MISC USE 1  TO CHECK GLUCOSE ONCE DAILY  . lisinopril (ZESTRIL) 10 MG tablet Take 1 tablet by mouth once daily  . metFORMIN (GLUCOPHAGE) 500 MG tablet Take 2 tablets by mouth twice daily  . nitroGLYCERIN (NITROSTAT) 0.4 MG SL tablet Take one tablet sublingual as needed for chest pain up to three doses as needed.  Glory Rosebush ULTRA test strip USE 1 STRIP TO CHECK GLUCOSE ONCE DAILY  . pioglitazone (ACTOS) 45 MG tablet Take 1 tablet (45 mg total) by mouth daily.  . rosuvastatin (CRESTOR) 20 MG tablet Take 1 tablet (20 mg total) by mouth daily.  . sitaGLIPtin (JANUVIA) 100 MG tablet Take 1 tablet (100 mg total) by mouth daily.  . vitamin B-12 (CYANOCOBALAMIN) 1000 MCG tablet Take 1,000 mcg by mouth daily.    No facility-administered encounter medications on file as of 05/13/2020.    Current Diagnosis: Patient Active Problem List   Diagnosis Date Noted  . Erectile dysfunction 03/11/2020  . CKD (chronic kidney disease) stage 3, GFR 30-59 ml/min 11/06/2019  . Cough 08/19/2016  . Hemoptysis 08/19/2016  . Normocytic anemia 06/29/2016  . Hypertension 12/25/2015  .  Obesity (BMI 30-39.9) 02/02/2014  . Acute URI 10/10/2012  . Overweight 04/05/2012  . Venous insufficiency 04/02/2011  . HEADACHE 02/25/2008  . CHEST PAIN, ATYPICAL 02/25/2008  . HYPERCHOLESTEROLEMIA 02/24/2008  . Poorly controlled type 2 diabetes mellitus ([place]) 02/23/2008  . ANXIETY 02/23/2008  . GERD 02/23/2008  . PSORIASIS 02/23/2008  . DEGENERATIVE JOINT DISEASE 02/23/2008  . COLONIC POLYPS, HX OF 02/23/2008    Goals Addressed   None   [name] had an initial appointment on 05/07/2020 with [facility] CPP. On completing this appointment patient agreed upon switching medications over to [facility]. I completed a onboarding form per [facility] CPP request.  Follow-Up:  Pharmacist Review  Maia Breslow, [place] Assistant [phone]  Reviewed chart for medication changes ahead of medication coordination call.  BP Readings from Last 3 Encounters:  05/07/20 120/60  05/03/20 110/64  03/11/20 118/72    Lab Results  Component Value Date   HGBA1C 7.9 (A) 03/11/2020     Verbal consent obtained for [facility] enhanced pharmacy services (medication synchronization, adherence packaging, delivery coordination). A medication sync plan was created to allow patient to get all medications delivered once every 30 to 90 days per patient preference. Patient understands they have freedom to choose pharmacy and clinical pharmacist will coordinate care between all prescribers and [facility].  Patient requested to obtain medications through Adherence Packaging  90 Days  Medication Name Last Fill Date & Day Supply Format: MM/DD/YY - DS (If last fill/DS unavailable, list pt.'s quantity on hand) Anticipated next due date  Format: MM/DD/YY      aspirin 81 MG tablet - 1 tablet daily  Add when adherence packaging starts  glimepiride (AMARYL) 4 MG tablet - 1 tablet before breakfast 02/15/2020 #90 (12 on hand) 05/15/2020  isosorbide mononitrate (IMDUR) 30  MG 24 hr tablet - 1 tablet daily 05/07/20 # 90 (84 on hand) 08/05/2020  lisinopril (ZESTRIL) 10 MG tablet - 1 tablet daily 04/07/20 # 90 (67 on hand) 07/06/2020  Metformin (GLUCOPHAGE) 500 MG tablet - 2 tablets twice daily 02/19/2020 #90 (42 on hand) 05/19/2020  pioglitazone (ACTOS) 45 MG tablet - 1 tablet daily 02/19/2020# 90 (10 on hand) 05/19/2020  rosuvastatin (CRESTOR) 20 MG tablet - 1 tablet daily 05/07/2020 # 90 (84 on hand) 08/05/2020  Sitagliptin (JANUVIA) 100 MG tablet - 1 tablet daily Obtains from manufacturers   JARDIANCE 10 MG TABS tablet - 1 tablet daily Obtains from Manufacturer   Lancets (ONETOUCH DELICA PLUS [id number]    ONETOUCH ULTRA test strip     vitamin B-12 (CYANOCOBALAMIN) 1000 MCG tablet   Add when adherence packaging starts  Sildenafil 20 mg PRN Patient is inquiring for price  Nitroglycerin 0.4 SL PRN Patient will call when needed     Patient will need a short fill of glimepiride, metformin, pioglitazone, lisinopril, prior to adherence delivery to align with sync date.  Prescriptions requested from PCP and specialists.   [name], PharmD Clinical Pharmacist [place] at [place]

## 2020-05-14 DIAGNOSIS — Z01812 Encounter for preprocedural laboratory examination: Secondary | ICD-10-CM

## 2020-05-14 DIAGNOSIS — R079 Chest pain, unspecified: Secondary | ICD-10-CM

## 2020-05-14 NOTE — Telephone Encounter (Signed)
Attempted to contact patient but there was no answer.

## 2020-05-15 ENCOUNTER — Other Ambulatory Visit: Payer: Self-pay

## 2020-05-15 ENCOUNTER — Other Ambulatory Visit: Payer: Medicare Other | Admitting: *Deleted

## 2020-05-15 DIAGNOSIS — Z01812 Encounter for preprocedural laboratory examination: Secondary | ICD-10-CM | POA: Diagnosis not present

## 2020-05-15 DIAGNOSIS — R079 Chest pain, unspecified: Secondary | ICD-10-CM

## 2020-05-15 LAB — BASIC METABOLIC PANEL
BUN/Creatinine Ratio: 14 (ref 10–24)
BUN: 25 mg/dL (ref 8–27)
CO2: 20 mmol/L (ref 20–29)
Calcium: 9.2 mg/dL (ref 8.6–10.2)
Chloride: 104 mmol/L (ref 96–106)
Creatinine, Ser: 1.82 mg/dL — ABNORMAL HIGH (ref 0.76–1.27)
GFR calc Af Amer: 42 mL/min/{1.73_m2} — ABNORMAL LOW (ref >59)
GFR calc non Af Amer: 36 mL/min/{1.73_m2} — ABNORMAL LOW (ref >59)
Glucose: 249 mg/dL — ABNORMAL HIGH (ref 65–99)
Potassium: 4.7 mmol/L (ref 3.5–5.2)
Sodium: 138 mmol/L (ref 134–144)

## 2020-05-15 LAB — CBC WITH DIFFERENTIAL/PLATELET
Basophils Absolute: 0.1 10*3/uL (ref 0.0–0.2)
Basos: 1 %
EOS (ABSOLUTE): 0.3 10*3/uL (ref 0.0–0.4)
Eos: 4 %
Hematocrit: 38.6 % (ref 37.5–51.0)
Hemoglobin: 12.9 g/dL — ABNORMAL LOW (ref 13.0–17.7)
Immature Grans (Abs): 0.1 10*3/uL (ref 0.0–0.1)
Immature Granulocytes: 1 %
Lymphocytes Absolute: 1.2 10*3/uL (ref 0.7–3.1)
Lymphs: 15 %
MCH: 28.9 pg (ref 26.6–33.0)
MCHC: 33.4 g/dL (ref 31.5–35.7)
MCV: 86 fL (ref 79–97)
Monocytes Absolute: 0.7 10*3/uL (ref 0.1–0.9)
Monocytes: 8 %
Neutrophils Absolute: 6 10*3/uL (ref 1.4–7.0)
Neutrophils: 71 %
Platelets: 227 10*3/uL (ref 150–450)
RBC: 4.47 x10E6/uL (ref 4.14–5.80)
RDW: 13.6 % (ref 11.6–15.4)
WBC: 8.3 10*3/uL (ref 3.4–10.8)

## 2020-05-15 NOTE — Telephone Encounter (Signed)
Called patient and set up heart cath with Dr. Irish Lack on Monday. Patient will get his lab work on Friday and COVID screening on Saturday. Sent instructions to patient's Mychart.

## 2020-05-16 MED ORDER — ONETOUCH DELICA PLUS LANCET33G MISC
3 refills | Status: DC
Start: 2020-05-16 — End: 2021-06-18

## 2020-05-16 MED ORDER — LISINOPRIL 10 MG PO TABS
10.0000 mg | ORAL_TABLET | Freq: Every day | ORAL | 3 refills | Status: DC
Start: 2020-05-16 — End: 2021-05-26

## 2020-05-16 MED ORDER — GLIMEPIRIDE 4 MG PO TABS
ORAL_TABLET | ORAL | 3 refills | Status: DC
Start: 2020-05-16 — End: 2021-02-17

## 2020-05-16 MED ORDER — METFORMIN HCL 500 MG PO TABS
1000.0000 mg | ORAL_TABLET | Freq: Two times a day (BID) | ORAL | 3 refills | Status: DC
Start: 2020-05-16 — End: 2020-05-20

## 2020-05-16 MED ORDER — ONETOUCH ULTRA VI STRP
ORAL_STRIP | 3 refills | Status: DC
Start: 2020-05-16 — End: 2021-06-18

## 2020-05-16 MED ORDER — PIOGLITAZONE HCL 45 MG PO TABS
45.0000 mg | ORAL_TABLET | Freq: Every day | ORAL | 3 refills | Status: DC
Start: 2020-05-16 — End: 2021-02-17

## 2020-05-16 MED ORDER — SILDENAFIL CITRATE 20 MG PO TABS: ORAL_TABLET | ORAL | 3 refills | Status: DC

## 2020-05-16 NOTE — Telephone Encounter (Signed)
Called and spoke to the patient. He had his labs done yesterday showing GFR at 36. Per protocol patient will arrive in the cath lab at 5:30 AM for hydration. He will hold his lisinopril the day before and the day of the procedure. Also clarified with patient to hold metformin the day of the procedure and for 48 hours after. The patient will also hold glimepiride, jardiance, januvia, and actos the morning of the procedure. He will take ASA 81 mg the morning of the procedure. Patient will get his covid test on Saturday. Patient verbalized understanding and thanked me for the call.

## 2020-05-16 NOTE — Addendum Note (Signed)
Addended by: [name] on: 05/16/2020 11:34 AM   Modules accepted: Orders

## 2020-05-17 ENCOUNTER — Other Ambulatory Visit: Payer: Medicare Other

## 2020-05-18 ENCOUNTER — Other Ambulatory Visit (HOSPITAL_COMMUNITY)
Admission: RE | Admit: 2020-05-18 | Discharge: 2020-05-18 | Disposition: A | Discharge status: Home / Self Care | Payer: Medicare Other | Source: Ambulatory Visit | Attending: Interventional Cardiology | Admitting: Interventional Cardiology

## 2020-05-18 DIAGNOSIS — Z01812 Encounter for preprocedural laboratory examination: Secondary | ICD-10-CM | POA: Insufficient documentation

## 2020-05-18 DIAGNOSIS — Z20822 Contact with and (suspected) exposure to covid-19: Secondary | ICD-10-CM | POA: Insufficient documentation

## 2020-05-18 LAB — SARS CORONAVIRUS 2 (TAT 6-24 HRS): SARS Coronavirus 2: NEGATIVE

## 2020-05-20 ENCOUNTER — Other Ambulatory Visit: Payer: Self-pay

## 2020-05-20 ENCOUNTER — Encounter (HOSPITAL_COMMUNITY)
Admission: RE | Disposition: A | Discharge status: Home / Self Care | Payer: Medicare Other | Source: Home / Self Care | Attending: Interventional Cardiology

## 2020-05-20 ENCOUNTER — Ambulatory Visit (HOSPITAL_COMMUNITY)
Admission: RE | Admit: 2020-05-20 | Discharge: 2020-05-20 | Disposition: A | Discharge status: Home / Self Care | Payer: Medicare Other | Attending: Interventional Cardiology | Admitting: Interventional Cardiology

## 2020-05-20 DIAGNOSIS — R079 Chest pain, unspecified: Secondary | ICD-10-CM | POA: Insufficient documentation

## 2020-05-20 DIAGNOSIS — Z79899 Other long term (current) drug therapy: Secondary | ICD-10-CM | POA: Diagnosis not present

## 2020-05-20 DIAGNOSIS — N189 Chronic kidney disease, unspecified: Secondary | ICD-10-CM | POA: Insufficient documentation

## 2020-05-20 DIAGNOSIS — Z881 Allergy status to other antibiotic agents status: Secondary | ICD-10-CM | POA: Insufficient documentation

## 2020-05-20 DIAGNOSIS — K219 Gastro-esophageal reflux disease without esophagitis: Secondary | ICD-10-CM | POA: Insufficient documentation

## 2020-05-20 DIAGNOSIS — E1122 Type 2 diabetes mellitus with diabetic chronic kidney disease: Secondary | ICD-10-CM | POA: Insufficient documentation

## 2020-05-20 DIAGNOSIS — F419 Anxiety disorder, unspecified: Secondary | ICD-10-CM | POA: Diagnosis not present

## 2020-05-20 DIAGNOSIS — Z87891 Personal history of nicotine dependence: Secondary | ICD-10-CM | POA: Insufficient documentation

## 2020-05-20 DIAGNOSIS — Z7982 Long term (current) use of aspirin: Secondary | ICD-10-CM | POA: Insufficient documentation

## 2020-05-20 DIAGNOSIS — Z888 Allergy status to other drugs, medicaments and biological substances status: Secondary | ICD-10-CM | POA: Insufficient documentation

## 2020-05-20 DIAGNOSIS — I2510 Atherosclerotic heart disease of native coronary artery without angina pectoris: Secondary | ICD-10-CM | POA: Insufficient documentation

## 2020-05-20 DIAGNOSIS — E7800 Pure hypercholesterolemia, unspecified: Secondary | ICD-10-CM | POA: Insufficient documentation

## 2020-05-20 DIAGNOSIS — E785 Hyperlipidemia, unspecified: Secondary | ICD-10-CM | POA: Diagnosis not present

## 2020-05-20 DIAGNOSIS — R0789 Other chest pain: Secondary | ICD-10-CM

## 2020-05-20 DIAGNOSIS — Z7984 Long term (current) use of oral hypoglycemic drugs: Secondary | ICD-10-CM | POA: Insufficient documentation

## 2020-05-20 HISTORY — PX: LEFT HEART CATH AND CORONARY ANGIOGRAPHY: CATH118249

## 2020-05-20 LAB — GLUCOSE, CAPILLARY: Glucose-Capillary: 139 mg/dL — ABNORMAL HIGH (ref 70–99)

## 2020-05-20 SURGERY — LEFT HEART CATH AND CORONARY ANGIOGRAPHY
Anesthesia: LOCAL

## 2020-05-20 MED ORDER — SODIUM CHLORIDE 0.9% FLUSH: 3.0000 mL | INTRAVENOUS | Status: DC | PRN

## 2020-05-20 MED ORDER — IOHEXOL 350 MG/ML SOLN
INTRAVENOUS | Status: DC | PRN
  Administered 2020-05-20: 45 mL

## 2020-05-20 MED ORDER — LABETALOL HCL 5 MG/ML IV SOLN: 10.0000 mg | INTRAVENOUS | Status: DC | PRN

## 2020-05-20 MED ORDER — FENTANYL CITRATE (PF) 100 MCG/2ML IJ SOLN
INTRAMUSCULAR | Status: AC
  Filled 2020-05-20: qty 2

## 2020-05-20 MED ORDER — SODIUM CHLORIDE 0.9% FLUSH: 3.0000 mL | Freq: Two times a day (BID) | INTRAVENOUS | Status: DC

## 2020-05-20 MED ORDER — MIDAZOLAM HCL 2 MG/2ML IJ SOLN
INTRAMUSCULAR | Status: DC | PRN
  Administered 2020-05-20: 2 mg via INTRAVENOUS

## 2020-05-20 MED ORDER — ACETAMINOPHEN 325 MG PO TABS: 650.0000 mg | ORAL_TABLET | ORAL | Status: DC | PRN

## 2020-05-20 MED ORDER — SODIUM CHLORIDE 0.9 % WEIGHT BASED INFUSION
3.0000 mL/kg/h | INTRAVENOUS | Status: AC
  Administered 2020-05-20: 3 mL/kg/h via INTRAVENOUS

## 2020-05-20 MED ORDER — HEPARIN SODIUM (PORCINE) 1000 UNIT/ML IJ SOLN
INTRAMUSCULAR | Status: DC | PRN
  Administered 2020-05-20: 5000 [IU] via INTRAVENOUS

## 2020-05-20 MED ORDER — VERAPAMIL HCL 2.5 MG/ML IV SOLN
INTRAVENOUS | Status: AC
  Filled 2020-05-20: qty 2

## 2020-05-20 MED ORDER — HEPARIN SODIUM (PORCINE) 1000 UNIT/ML IJ SOLN
INTRAMUSCULAR | Status: AC
  Filled 2020-05-20: qty 1

## 2020-05-20 MED ORDER — MIDAZOLAM HCL 2 MG/2ML IJ SOLN
INTRAMUSCULAR | Status: AC
Start: 2020-05-20 — End: ?
  Filled 2020-05-20: qty 2

## 2020-05-20 MED ORDER — LIDOCAINE HCL (PF) 1 % IJ SOLN
INTRAMUSCULAR | Status: DC | PRN
  Administered 2020-05-20: 2 mL

## 2020-05-20 MED ORDER — METFORMIN HCL 500 MG PO TABS
1000.0000 mg | ORAL_TABLET | Freq: Two times a day (BID) | ORAL | 3 refills | Status: DC
Start: 2020-05-22 — End: 2020-10-21

## 2020-05-20 MED ORDER — FENTANYL CITRATE (PF) 100 MCG/2ML IJ SOLN
INTRAMUSCULAR | Status: DC | PRN
Start: 2020-05-20 — End: 2020-05-20
  Administered 2020-05-20: 25 ug via INTRAVENOUS

## 2020-05-20 MED ORDER — SODIUM CHLORIDE 0.9 % IV SOLN: 250.0000 mL | INTRAVENOUS | Status: DC | PRN

## 2020-05-20 MED ORDER — HEPARIN (PORCINE) IN NACL 1000-0.9 UT/500ML-% IV SOLN
INTRAVENOUS | Status: DC | PRN
  Administered 2020-05-20 (×2): 500 mL

## 2020-05-20 MED ORDER — HEPARIN (PORCINE) IN NACL 1000-0.9 UT/500ML-% IV SOLN
INTRAVENOUS | Status: AC
  Filled 2020-05-20: qty 1000

## 2020-05-20 MED ORDER — ASPIRIN 81 MG PO CHEW
81.0000 mg | CHEWABLE_TABLET | ORAL | Status: AC
  Administered 2020-05-20: 81 mg via ORAL
  Filled 2020-05-20: qty 1

## 2020-05-20 MED ORDER — ONDANSETRON HCL 4 MG/2ML IJ SOLN: 4.0000 mg | Freq: Four times a day (QID) | INTRAMUSCULAR | Status: DC | PRN

## 2020-05-20 MED ORDER — SODIUM CHLORIDE 0.9 % IV SOLN: INTRAVENOUS | Status: AC

## 2020-05-20 MED ORDER — SODIUM CHLORIDE 0.9 % WEIGHT BASED INFUSION
1.0000 mL/kg/h | INTRAVENOUS | Status: DC
  Administered 2020-05-20: 1 mL/kg/h via INTRAVENOUS

## 2020-05-20 MED ORDER — LIDOCAINE HCL (PF) 1 % IJ SOLN
INTRAMUSCULAR | Status: AC
  Filled 2020-05-20: qty 30

## 2020-05-20 MED ORDER — VERAPAMIL HCL 2.5 MG/ML IV SOLN
INTRAVENOUS | Status: DC | PRN
  Administered 2020-05-20: 10 mL via INTRA_ARTERIAL

## 2020-05-20 MED ORDER — HYDRALAZINE HCL 20 MG/ML IJ SOLN: 10.0000 mg | INTRAMUSCULAR | Status: DC | PRN

## 2020-05-20 SURGICAL SUPPLY — 9 items

## 2020-05-20 NOTE — Progress Notes (Signed)
Patient and daugther was given discharge instructions. Both verbalized understanding.

## 2020-05-20 NOTE — Discharge Instructions (Signed)
Drink plenty of fluid for 48 hours and keep wrist elevated at heart level for 24 hours  Radial Site Care   This sheet gives you information about how to care for yourself after your procedure. Your health care provider may also give you more specific instructions. If you have problems or questions, contact your health care provider. What can I expect after the procedure? After the procedure, it is common to have:  Bruising and tenderness at the catheter insertion area. Follow these instructions at home: Medicines  Take over-the-counter and prescription medicines only as told by your health care provider. Insertion site care 1. Follow instructions from your health care provider about how to take care of your insertion site. Make sure you: ? Wash your hands with soap and water before you change your bandage (dressing). If soap and water are not available, use hand sanitizer. ? remove your dressing as told by your health care provider. In 24 hours 2. Check your insertion site every day for signs of infection. Check for: ? Redness, swelling, or pain. ? Fluid or blood. ? Pus or a bad smell. ? Warmth. 3. Do not take baths, swim, or use a hot tub until your health care provider approves. 4. You may shower 24-48 hours after the procedure, or as directed by your health care provider. ? Remove the dressing and gently wash the site with plain soap and water. ? Pat the area dry with a clean towel. ? Do not rub the site. That could cause bleeding. 5. Do not apply powder or lotion to the site. Activity   1. For 24 hours after the procedure, or as directed by your health care provider: ? Do not flex or bend the affected arm. ? Do not push or pull heavy objects with the affected arm. ? Do not drive yourself home from the hospital or clinic. You may drive 24 hours after the procedure unless your health care provider tells you not to. ? Do not operate machinery or power tools. 2. Do not lift  anything that is heavier than 10 lb (4.5 kg), or the limit that you are told, until your health care provider says that it is safe. For 4 days 3. Ask your health care provider when it is okay to: ? Return to work or school. ? Resume usual physical activities or sports. ? Resume sexual activity. General instructions  If the catheter site starts to bleed, raise your arm and put firm pressure on the site. If the bleeding does not stop, get help right away. This is a medical emergency.  If you went home on the same day as your procedure, a responsible adult should be with you for the first 24 hours after you arrive home.  Keep all follow-up visits as told by your health care provider. This is important. Contact a health care provider if:  You have a fever.  You have redness, swelling, or yellow drainage around your insertion site. Get help right away if:  You have unusual pain at the radial site.  The catheter insertion area swells very fast.  The insertion area is bleeding, and the bleeding does not stop when you hold steady pressure on the area.  Your arm or hand becomes pale, cool, tingly, or numb. These symptoms may represent a serious problem that is an emergency. Do not wait to see if the symptoms will go away. Get medical help right away. Call your local emergency services (911 in the [place] Do not   drive yourself to the hospital. Summary  After the procedure, it is common to have bruising and tenderness at the site.  Follow instructions from your health care provider about how to take care of your radial site wound. Check the wound every day for signs of infection.  Do not lift anything that is heavier than 10 lb (4.5 kg), or the limit that you are told, until your health care provider says that it is safe. This information is not intended to replace advice given to you by your health care provider. Make sure you discuss any questions you have with your health care  provider. Document Revised: 09/22/2017 Document Reviewed: 09/22/2017 Elsevier Patient Education  2020 [employer]vier Inc.  

## 2020-05-20 NOTE — Interval H&P Note (Signed)
Cath Lab Visit (complete for each Cath Lab visit)  Clinical Evaluation Leading to the Procedure:   ACS: No.  Non-ACS:    Anginal Classification: CCS III  Anti-ischemic medical therapy: Minimal Therapy (1 class of medications)  Non-Invasive Test Results: No non-invasive testing performed  Prior CABG: No previous CABG      History and Physical Interval Note:  05/20/2020 9:23 AM  Lucas Warren  has presented today for surgery, with the diagnosis of chest pain.  The various methods of treatment have been discussed with the patient and family. After consideration of risks, benefits and other options for treatment, the patient has consented to  Procedure(s): LEFT HEART CATH AND CORONARY ANGIOGRAPHY (N/A) as a surgical intervention.  The patient's history has been reviewed, patient examined, no change in status, stable for surgery.  I have reviewed the patient's chart and labs.  Questions were answered to the patient's satisfaction.     [name]

## 2020-05-21 ENCOUNTER — Encounter (HOSPITAL_COMMUNITY): Payer: Self-pay | Admitting: Interventional Cardiology

## 2020-05-24 ENCOUNTER — Encounter: Payer: Self-pay | Admitting: Family Medicine

## 2020-06-04 ENCOUNTER — Other Ambulatory Visit: Payer: Medicare Other

## 2020-06-11 ENCOUNTER — Encounter: Payer: Self-pay | Admitting: Family Medicine

## 2020-06-11 ENCOUNTER — Ambulatory Visit: Payer: Medicare Other | Admitting: Family Medicine

## 2020-06-11 ENCOUNTER — Other Ambulatory Visit: Payer: Self-pay

## 2020-06-11 ENCOUNTER — Ambulatory Visit (INDEPENDENT_AMBULATORY_CARE_PROVIDER_SITE_OTHER): Payer: Medicare Other | Admitting: Family Medicine

## 2020-06-11 VITALS — BP 122/80 | HR 56 | Temp 97.9°F | Ht 72.0 in | Wt 229.0 lb

## 2020-06-11 DIAGNOSIS — N183 Chronic kidney disease, stage 3 unspecified: Secondary | ICD-10-CM

## 2020-06-11 DIAGNOSIS — I10 Essential (primary) hypertension: Secondary | ICD-10-CM

## 2020-06-11 DIAGNOSIS — Z23 Encounter for immunization: Secondary | ICD-10-CM

## 2020-06-11 DIAGNOSIS — E1165 Type 2 diabetes mellitus with hyperglycemia: Secondary | ICD-10-CM

## 2020-06-11 DIAGNOSIS — E7800 Pure hypercholesterolemia, unspecified: Secondary | ICD-10-CM

## 2020-06-11 LAB — POCT GLYCOSYLATED HEMOGLOBIN (HGB A1C): Hemoglobin A1C: 7.7 % — AB (ref 4.0–5.6)

## 2020-06-11 NOTE — Progress Notes (Signed)
Established Patient Office Visit  Subjective:  Patient ID: Lucas Warren, male    DOB: February 21, 1946  Age: 74 y.o. MRN: [id number]  CC:  Chief Complaint  Patient presents with  . Diabetes    having swewelling in right foot for time to time, no pain    HPI Lucas Warren presents for medical follow-up.  He had some chest pains last time we saw him and had positive family history of CAD in his father.  We referred him to cardiology.  He underwent cardiac cath and fortunately this showed no significant obstructive coronary disease.  He had 10% plaques and a few different vessels.  He is stable at this time with no further chest pains currently.  His pravastatin was switched over to rosuvastatin.  He is taking this without side effect.  Needs follow-up labs.  Blood sugars been stable with fastings 90-130.  His last A1c was 7.9%.  He states he is compliant with all of his diabetic medications.  No recent hypoglycemic symptoms.  He has been very reluctant to consider insulin or further diabetic medications at this time.  He has started walking about 1/2 mile a day and hopes to build this up.  His blood pressures been stable.  No recent dizziness or headaches.  He did complain of some right foot edema but no pain in his foot.  He has not had any leg edema.  No calf pain.  No history of injury.  No erythema.  Swelling tends to be worse late in the day.  Past Medical History:  Diagnosis Date  . Allergy    seasonal  . Anxiety   . Chest pain, atypical   . Colon polyps    hyperplastic 2003  . DJD (degenerative joint disease)   . DM (diabetes mellitus) ([place])   . GERD (gastroesophageal reflux disease)   . History of headache   . Hypercholesteremia   . Psoriasis     Past Surgical History:  Procedure Laterality Date  . CATARACT EXTRACTION, BILATERAL Bilateral    Dr. Tommy Rainwater;   . INGUINAL HERNIA REPAIR  1989  . LEFT HEART CATH AND CORONARY ANGIOGRAPHY N/A 05/20/2020   Procedure: LEFT HEART  CATH AND CORONARY ANGIOGRAPHY;  Surgeon: Jettie Booze, MD;  Location: [facility];  Service: Cardiovascular;  Laterality: N/A;  . NASAL SEPTUM SURGERY  1989    Family History  Problem Relation Age of Onset  . Lung cancer Mother   . Alzheimer's disease Father   . Heart disease Father   . Leukemia Sister     Social History   Socioeconomic History  . Marital status: Divorced    Spouse name: carolyn  . Number of children: Not on file  . Years of education: Not on file  . Highest education level: Not on file  Occupational History    Comment: Patent examiner  Tobacco Use  . Smoking status: Former Smoker    Packs/day: 2.00    Years: 20.00    Pack years: 40.00    Types: Cigarettes    Quit date: 08/31/1981    Years since quitting: 38.8  . Smokeless tobacco: Never Used  Vaping Use  . Vaping Use: Never used  Substance and Sexual Activity  . Alcohol use: Yes    Comment: rarely a beer  . Drug use: No  . Sexual activity: Not on file  Other Topics Concern  . Not on file  Social History Narrative  . Not   Established Patient Office Visit  Subjective:  Patient ID: Lucas Warren, male    DOB: 07/21/1946  Age: 74 y.o. MRN: [id number]  CC:  Chief Complaint  Patient presents with  . Diabetes    having swewelling in right foot for time to time, no pain    HPI Lucas Warren presents for medical follow-up.  He had some chest pains last time we saw him and had positive family history of CAD in his father.  We referred him to cardiology.  He underwent cardiac cath and fortunately this showed no significant obstructive coronary disease.  He had 10% plaques and a few different vessels.  He is stable at this time with no further chest pains currently.  His pravastatin was switched over to rosuvastatin.  He is taking this without side effect.  Needs follow-up labs.  Blood sugars been stable with fastings 90-130.  His last A1c was 7.9%.  He states he is compliant with all of his diabetic medications.  No recent hypoglycemic symptoms.  He has been very reluctant to consider insulin or further diabetic medications at this time.  He has started walking about 1/2 mile a day and hopes to build this up.  His blood pressures been stable.  No recent dizziness or headaches.  He did complain of some right foot edema but no pain in his foot.  He has not had any leg edema.  No calf pain.  No history of injury.  No erythema.  Swelling tends to be worse late in the day.  Past Medical History:  Diagnosis Date  . Allergy    seasonal  . Anxiety   . Chest pain, atypical   . Colon polyps    hyperplastic 2003  . DJD (degenerative joint disease)   . DM (diabetes mellitus) ([facility])   . GERD (gastroesophageal reflux disease)   . History of headache   . Hypercholesteremia   . Psoriasis     Past Surgical History:  Procedure Laterality Date  . CATARACT EXTRACTION, BILATERAL Bilateral    Dr. Beavis;   . INGUINAL HERNIA REPAIR  1989  . LEFT HEART CATH AND CORONARY ANGIOGRAPHY N/A 05/20/2020   Procedure: LEFT HEART  CATH AND CORONARY ANGIOGRAPHY;  Surgeon: Varanasi, Jayadeep S, MD;  Location: [facility];  Service: Cardiovascular;  Laterality: N/A;  . NASAL SEPTUM SURGERY  1989    Family History  Problem Relation Age of Onset  . Lung cancer Mother   . Alzheimer's disease Father   . Heart disease Father   . Leukemia Sister     Social History   Socioeconomic History  . Marital status: Divorced    Spouse name: carolyn  . Number of children: Not on file  . Years of education: Not on file  . Highest education level: Not on file  Occupational History    Comment: Auto body shop manager  Tobacco Use  . Smoking status: Former Smoker    Packs/day: 2.00    Years: 20.00    Pack years: 40.00    Types: Cigarettes    Quit date: 08/31/1981    Years since quitting: 38.8  . Smokeless tobacco: Never Used  Vaping Use  . Vaping Use: Never used  Substance and Sexual Activity  . Alcohol use: Yes    Comment: rarely a beer  . Drug use: No  . Sexual activity: Not on file  Other Topics Concern  . Not on file  Social History Narrative  . Not   Established Patient Office Visit  Subjective:  Patient ID: Lucas Warren, male    DOB: February 21, 1946  Age: 74 y.o. MRN: [id number]  CC:  Chief Complaint  Patient presents with  . Diabetes    having swewelling in right foot for time to time, no pain    HPI Lucas Warren presents for medical follow-up.  He had some chest pains last time we saw him and had positive family history of CAD in his father.  We referred him to cardiology.  He underwent cardiac cath and fortunately this showed no significant obstructive coronary disease.  He had 10% plaques and a few different vessels.  He is stable at this time with no further chest pains currently.  His pravastatin was switched over to rosuvastatin.  He is taking this without side effect.  Needs follow-up labs.  Blood sugars been stable with fastings 90-130.  His last A1c was 7.9%.  He states he is compliant with all of his diabetic medications.  No recent hypoglycemic symptoms.  He has been very reluctant to consider insulin or further diabetic medications at this time.  He has started walking about 1/2 mile a day and hopes to build this up.  His blood pressures been stable.  No recent dizziness or headaches.  He did complain of some right foot edema but no pain in his foot.  He has not had any leg edema.  No calf pain.  No history of injury.  No erythema.  Swelling tends to be worse late in the day.  Past Medical History:  Diagnosis Date  . Allergy    seasonal  . Anxiety   . Chest pain, atypical   . Colon polyps    hyperplastic 2003  . DJD (degenerative joint disease)   . DM (diabetes mellitus) ([place])   . GERD (gastroesophageal reflux disease)   . History of headache   . Hypercholesteremia   . Psoriasis     Past Surgical History:  Procedure Laterality Date  . CATARACT EXTRACTION, BILATERAL Bilateral    Dr. Tommy Rainwater;   . INGUINAL HERNIA REPAIR  1989  . LEFT HEART CATH AND CORONARY ANGIOGRAPHY N/A 05/20/2020   Procedure: LEFT HEART  CATH AND CORONARY ANGIOGRAPHY;  Surgeon: Jettie Booze, MD;  Location: [facility];  Service: Cardiovascular;  Laterality: N/A;  . NASAL SEPTUM SURGERY  1989    Family History  Problem Relation Age of Onset  . Lung cancer Mother   . Alzheimer's disease Father   . Heart disease Father   . Leukemia Sister     Social History   Socioeconomic History  . Marital status: Divorced    Spouse name: carolyn  . Number of children: Not on file  . Years of education: Not on file  . Highest education level: Not on file  Occupational History    Comment: Patent examiner  Tobacco Use  . Smoking status: Former Smoker    Packs/day: 2.00    Years: 20.00    Pack years: 40.00    Types: Cigarettes    Quit date: 08/31/1981    Years since quitting: 38.8  . Smokeless tobacco: Never Used  Vaping Use  . Vaping Use: Never used  Substance and Sexual Activity  . Alcohol use: Yes    Comment: rarely a beer  . Drug use: No  . Sexual activity: Not on file  Other Topics Concern  . Not on file  Social History Narrative  . Not  Established Patient Office Visit  Subjective:  Patient ID: Lucas Warren, male    DOB: February 21, 1946  Age: 74 y.o. MRN: [id number]  CC:  Chief Complaint  Patient presents with  . Diabetes    having swewelling in right foot for time to time, no pain    HPI Lucas Warren presents for medical follow-up.  He had some chest pains last time we saw him and had positive family history of CAD in his father.  We referred him to cardiology.  He underwent cardiac cath and fortunately this showed no significant obstructive coronary disease.  He had 10% plaques and a few different vessels.  He is stable at this time with no further chest pains currently.  His pravastatin was switched over to rosuvastatin.  He is taking this without side effect.  Needs follow-up labs.  Blood sugars been stable with fastings 90-130.  His last A1c was 7.9%.  He states he is compliant with all of his diabetic medications.  No recent hypoglycemic symptoms.  He has been very reluctant to consider insulin or further diabetic medications at this time.  He has started walking about 1/2 mile a day and hopes to build this up.  His blood pressures been stable.  No recent dizziness or headaches.  He did complain of some right foot edema but no pain in his foot.  He has not had any leg edema.  No calf pain.  No history of injury.  No erythema.  Swelling tends to be worse late in the day.  Past Medical History:  Diagnosis Date  . Allergy    seasonal  . Anxiety   . Chest pain, atypical   . Colon polyps    hyperplastic 2003  . DJD (degenerative joint disease)   . DM (diabetes mellitus) ([place])   . GERD (gastroesophageal reflux disease)   . History of headache   . Hypercholesteremia   . Psoriasis     Past Surgical History:  Procedure Laterality Date  . CATARACT EXTRACTION, BILATERAL Bilateral    Dr. Tommy Rainwater;   . INGUINAL HERNIA REPAIR  1989  . LEFT HEART CATH AND CORONARY ANGIOGRAPHY N/A 05/20/2020   Procedure: LEFT HEART  CATH AND CORONARY ANGIOGRAPHY;  Surgeon: Jettie Booze, MD;  Location: [facility];  Service: Cardiovascular;  Laterality: N/A;  . NASAL SEPTUM SURGERY  1989    Family History  Problem Relation Age of Onset  . Lung cancer Mother   . Alzheimer's disease Father   . Heart disease Father   . Leukemia Sister     Social History   Socioeconomic History  . Marital status: Divorced    Spouse name: carolyn  . Number of children: Not on file  . Years of education: Not on file  . Highest education level: Not on file  Occupational History    Comment: Patent examiner  Tobacco Use  . Smoking status: Former Smoker    Packs/day: 2.00    Years: 20.00    Pack years: 40.00    Types: Cigarettes    Quit date: 08/31/1981    Years since quitting: 38.8  . Smokeless tobacco: Never Used  Vaping Use  . Vaping Use: Never used  Substance and Sexual Activity  . Alcohol use: Yes    Comment: rarely a beer  . Drug use: No  . Sexual activity: Not on file  Other Topics Concern  . Not on file  Social History Narrative  . Not

## 2020-06-11 NOTE — Patient Instructions (Signed)
° ° ° °  If you have lab work done today you will be contacted with your lab results within the next 2 weeks.  If you have not heard from us then please contact us. The fastest way to get your results is to register for My Chart. ° ° °IF you received an x-ray today, you will receive an invoice from [facility]. Please contact [facility] at [phone] with questions or concerns regarding your invoice.  ° °IF you received labwork today, you will receive an invoice from [facility]. Please contact [facility] at [phone] with questions or concerns regarding your invoice.  ° °Our billing staff will not be able to assist you with questions regarding bills from these companies. ° °You will be contacted with the lab results as soon as they are available. The fastest way to get your results is to activate your My Chart account. Instructions are located on the last page of this paperwork. If you have not heard from us regarding the results in 2 weeks, please contact this office. °  ° ° ° °

## 2020-06-12 LAB — LIPID PANEL
Cholesterol: 127 mg/dL (ref <200)
HDL: 50 mg/dL (ref >=40)
LDL Cholesterol (Calc): 57 mg/dL (calc)
Non-HDL Cholesterol (Calc): 77 mg/dL (calc) (ref <130)
Total CHOL/HDL Ratio: 2.5 (calc) (ref <5.0)
Triglycerides: 117 mg/dL (ref <150)

## 2020-06-12 LAB — COMPLETE METABOLIC PANEL WITH GFR
AG Ratio: 1.7 (calc) (ref 1.0–2.5)
ALT: 8 U/L — ABNORMAL LOW (ref 9–46)
AST: 12 U/L (ref 10–35)
Albumin: 4.2 g/dL (ref 3.6–5.1)
Alkaline phosphatase (APISO): 48 U/L (ref 35–144)
BUN/Creatinine Ratio: 17 (calc) (ref 6–22)
BUN: 28 mg/dL — ABNORMAL HIGH (ref 7–25)
CO2: 23 mmol/L (ref 20–32)
Calcium: 9.1 mg/dL (ref 8.6–10.3)
Chloride: 105 mmol/L (ref 98–110)
Creat: 1.62 mg/dL — ABNORMAL HIGH (ref 0.70–1.18)
GFR, Est African American: 48 mL/min/{1.73_m2} — ABNORMAL LOW (ref >=60)
GFR, Est Non African American: 42 mL/min/{1.73_m2} — ABNORMAL LOW (ref >=60)
Globulin: 2.5 g/dL (calc) (ref 1.9–3.7)
Glucose, Bld: 95 mg/dL (ref 65–99)
Potassium: 4.3 mmol/L (ref 3.5–5.3)
Sodium: 140 mmol/L (ref 135–146)
Total Bilirubin: 0.4 mg/dL (ref 0.2–1.2)
Total Protein: 6.7 g/dL (ref 6.1–8.1)

## 2020-06-12 LAB — MICROALBUMIN / CREATININE URINE RATIO
Creatinine, Urine: 142 mg/dL (ref 20–320)
Microalb Creat Ratio: 18 mcg/mg creat (ref <30)
Microalb, Ur: 2.5 mg/dL

## 2020-07-31 ENCOUNTER — Telehealth: Payer: Self-pay | Admitting: Pharmacist

## 2020-07-31 NOTE — Chronic Care Management (AMB) (Signed)
Chronic Care Management Pharmacy Assistant   Name: Lucas Warren  MRN: [id number] DOB: October 01, 1945  Reason for Encounter: Medication Review  Patient Questions:  1.  Have you seen any other providers since your last visit? No  2.  Any changes in your medicines or health? No   PCP : [name], MD  Allergies:   Allergies  Allergen Reactions  . Lisinopril Cough  . Erythromycin     REACTION: abd pain  . Trulicity [Dulaglutide] Nausea Only    Medications: Outpatient Encounter Medications as of 07/31/2020  Medication Sig  . aspirin 81 MG tablet Take 1 tablet by mouth at bedtime.   Marland Kitchen glimepiride (AMARYL) 4 MG tablet Take 1 tablet by mouth once daily with breakfast  . glucose blood (ONETOUCH ULTRA) test strip Use one strip twice daily to check blood sugars  . JARDIANCE 10 MG TABS tablet Take 1 tablet by mouth once daily (Patient taking differently: Take 10 mg by mouth daily. )  . Lancets (ONETOUCH DELICA PLUS ZSWFUX32T) MISC USE 1  TO CHECK GLUCOSE ONCE DAILY  . Lancets (ONETOUCH DELICA PLUS [id number]) MISC USE 1  TO CHECK GLUCOSE TWICE DAILY  . lisinopril (ZESTRIL) 10 MG tablet Take 1 tablet (10 mg total) by mouth daily.  . metFORMIN (GLUCOPHAGE) 500 MG tablet Take 2 tablets (1,000 mg total) by mouth 2 (two) times daily with a meal.  . nitroGLYCERIN (NITROSTAT) 0.4 MG SL tablet Take one tablet sublingual as needed for chest pain up to three doses as needed. (Patient taking differently: Place 0.4 mg under the tongue every 5 (five) minutes as needed for chest pain. )  . pioglitazone (ACTOS) 45 MG tablet Take 1 tablet (45 mg total) by mouth daily.  . rosuvastatin (CRESTOR) 20 MG tablet Take 1 tablet (20 mg total) by mouth daily. (Patient taking differently: Take 20 mg by mouth at bedtime. )  . sildenafil (REVATIO) 20 MG tablet Take 2 to 5 tablets by mouth one hour prior to sexual activity as needed  . sitaGLIPtin (JANUVIA) 100 MG tablet Take 1 tablet (100 mg total) by mouth  daily. (Patient taking differently: Take 100 mg by mouth at bedtime. )  . vitamin B-12 (CYANOCOBALAMIN) 1000 MCG tablet Take 1,000 mcg by mouth at bedtime.    No facility-administered encounter medications on file as of 07/31/2020.    Current Diagnosis: Patient Active Problem List   Diagnosis Date Noted  . Erectile dysfunction 03/11/2020  . CKD (chronic kidney disease) stage 3, GFR 30-59 ml/min ([facility]) 11/06/2019  . Cough 08/19/2016  . Hemoptysis 08/19/2016  . Normocytic anemia 06/29/2016  . Hypertension 12/25/2015  . Obesity (BMI 30-39.9) 02/02/2014  . Acute URI 10/10/2012  . Overweight 04/05/2012  . Venous insufficiency 04/02/2011  . HEADACHE 02/25/2008  . CHEST PAIN, ATYPICAL 02/25/2008  . HYPERCHOLESTEROLEMIA 02/24/2008  . Poorly controlled type 2 diabetes mellitus ([place]) 02/23/2008  . ANXIETY 02/23/2008  . GERD 02/23/2008  . PSORIASIS 02/23/2008  . DEGENERATIVE JOINT DISEASE 02/23/2008  . COLONIC POLYPS, HX OF 02/23/2008    Goals Addressed   None    Reviewed chart for medication changes ahead of medication coordination call. No OVs, Consults, or hospital visits since last care coordination call/Pharmacist visit.  No medication changes indicated   BP Readings from Last 3 Encounters:  06/11/20 122/80  05/20/20 (!) 111/52  05/07/20 120/60    Lab Results  Component Value Date   HGBA1C 7.7 (A) 06/11/2020     Patient obtains medications  through Adherence Packaging  30 Days   Last adherence delivery included:  . Lisinopril (ZESTRIL) 10 mg: one tablet at dinner . Metformin (GLUCOPHAGE) 500 mg:  two (2) tablets at breakfast and dinner . Pioglitazone (ACTOS) 45 mg: one tablet at breakfast . Glimepiride (AMARYL) 4 mg: one tablet a breakfast . Glucose blood (ONETOUCH ULTRA) test strip   Patient is due for next adherence delivery on: 08-05-2020. Called patient and reviewed medications and coordinated delivery.  This delivery to include: . Lisinopril (ZESTRIL) 10 mg:  one tablet at dinner . Metformin (GLUCOPHAGE) 500 mg:  two (2) tablets at breakfast and dinner . Pioglitazone (ACTOS) 45 mg: one tablet at breakfast . Rosuvastatin (CRESTOR) 20 mg: one tablet at bedtime . Glimepiride (AMARYL) 4 mg: one tablet a breakfast . Lancets (ONETOUCH DELICA PLUS LANCET 33 G) MISC: use 1 to check glucose twice daily  I spoke with the patient and review medications. There are no changes in medications currently. The patient is taking the following medications. He declined then due to PRN and supply on hand: . JARDIANCE 10 MG TABS tablet  . Nitroglycerin (NITROSTAT) 0.4 MG SL tablet . Sildenafil (REVATIO) 20 MG tablet . Sitagliptin (JANUVIA) 100 MG tablet  . Vitamin B-12 (CYANOCOBALAMIN) 1000 MCG tablet . Glucose blood (ONETOUCH ULTRA) test strip   He does not refills currently. Confirmed delivery date of  08-05-2020, advised patient that pharmacy will contact them the morning of delivery.  Follow-Up:  Coordination of Enhanced Pharmacy Services   Amilia ([place]) Mare Ferrari, [place] Assistant [phone]

## 2020-08-02 ENCOUNTER — Other Ambulatory Visit: Payer: Self-pay

## 2020-08-02 ENCOUNTER — Telehealth: Payer: Self-pay | Admitting: Interventional Cardiology

## 2020-08-02 MED ORDER — ROSUVASTATIN CALCIUM 20 MG PO TABS
20.0000 mg | ORAL_TABLET | Freq: Every day | ORAL | 3 refills | Status: DC
Start: 2020-08-02 — End: 2021-06-23

## 2020-08-02 NOTE — Telephone Encounter (Signed)
*  STAT* If patient is at the pharmacy, call can be transferred to refill team.   1. Which medications need to be refilled? (please list name of each medication and dose if known) rosuvastatin (CRESTOR) 20 MG tablet  2. Which pharmacy/location (including street and city if local pharmacy) is medication to be sent to? [facility] - [place], [place] - [place] [address]  3. Do they need a 30 day or 90 day supply? 90 day   Patient would like to switch pharamcies. Refill was sent to [facility], but he would like it sent to [facility]

## 2020-08-07 NOTE — Progress Notes (Signed)
Cardiology Office Note   Date:  08/08/2020   ID:  Lucas Warren, DOB 11/24/1945, MRN [id number]  PCP:  [name], MD    No chief complaint on file.  Chest pain  Wt Readings from Last 3 Encounters:  08/08/20 227 lb 12.8 oz (103.3 kg)  06/11/20 229 lb (103.9 kg)  05/20/20 228 lb (103.4 kg)       History of Present Illness: Lucas Warren is a 74 y.o. male who I first saw in September 2021 for chest discomfort.  He underwent cardiac catheterization in September 2021 showing:  "Ost RCA to Prox RCA lesion is 10% stenosed.  Prox LAD lesion is 10% stenosed.  Mid Cx lesion is 10% stenosed.  The left ventricular systolic function is normal.  LV end diastolic pressure is mildly elevated.  The left ventricular ejection fraction is 55-65% by visual estimate.  There is no aortic valve stenosis.   Mild, scattered, nonobstructive coronary atherosclerosis.  Continue preventive therapy."  Since the last visit, he has had some chest tightness and DOE.  Tightness is not related to walking.  He has a productive cough in the mornings.  He has a postnasal drip as well.    He had all of his COVID shots and flu shots annually.     Past Medical History:  Diagnosis Date  . Allergy    seasonal  . Anxiety   . Chest pain, atypical   . Colon polyps    hyperplastic 2003  . DJD (degenerative joint disease)   . DM (diabetes mellitus) ([place])   . GERD (gastroesophageal reflux disease)   . History of headache   . Hypercholesteremia   . Psoriasis     Past Surgical History:  Procedure Laterality Date  . CATARACT EXTRACTION, BILATERAL Bilateral    Dr. Tommy Rainwater;   . INGUINAL HERNIA REPAIR  1989  . LEFT HEART CATH AND CORONARY ANGIOGRAPHY N/A 05/20/2020   Procedure: LEFT HEART CATH AND CORONARY ANGIOGRAPHY;  Surgeon: Jettie Booze, MD;  Location: [facility];  Service: Cardiovascular;  Laterality: N/A;  . NASAL SEPTUM SURGERY  1989     Current Outpatient  Medications  Medication Sig Dispense Refill  . aspirin 81 MG tablet Take 1 tablet by mouth at bedtime.     Marland Kitchen glimepiride (AMARYL) 4 MG tablet Take 1 tablet by mouth once daily with breakfast 90 tablet 3  . glucose blood (ONETOUCH ULTRA) test strip Use one strip twice daily to check blood sugars 180 each 3  . JARDIANCE 10 MG TABS tablet Take 1 tablet by mouth once daily (Patient taking differently: Take 10 mg by mouth daily.) 90 tablet 0  . Lancets (ONETOUCH DELICA PLUS [id number]) MISC USE 1  TO CHECK GLUCOSE ONCE DAILY 100 each 4  . Lancets (ONETOUCH DELICA PLUS [id number]) MISC USE 1  TO CHECK GLUCOSE TWICE DAILY 180 each 3  . lisinopril (ZESTRIL) 10 MG tablet Take 1 tablet (10 mg total) by mouth daily. 90 tablet 3  . metFORMIN (GLUCOPHAGE) 500 MG tablet Take 2 tablets (1,000 mg total) by mouth 2 (two) times daily with a meal. 180 tablet 3  . nitroGLYCERIN (NITROSTAT) 0.4 MG SL tablet Take one tablet sublingual as needed for chest pain up to three doses as needed. (Patient taking differently: Place 0.4 mg under the tongue every 5 (five) minutes as needed for chest pain.) 20 tablet 0  . pioglitazone (ACTOS) 45 MG tablet Take 1 tablet (45 mg total)  by mouth daily. 90 tablet 3  . rosuvastatin (CRESTOR) 20 MG tablet Take 1 tablet (20 mg total) by mouth daily. 90 tablet 3  . sildenafil (REVATIO) 20 MG tablet Take 2 to 5 tablets by mouth one hour prior to sexual activity as needed 50 tablet 3  . sitaGLIPtin (JANUVIA) 100 MG tablet Take 1 tablet (100 mg total) by mouth daily. (Patient taking differently: Take 100 mg by mouth at bedtime.) 90 tablet 3  . vitamin B-12 (CYANOCOBALAMIN) 1000 MCG tablet Take 1,000 mcg by mouth at bedtime.      No current facility-administered medications for this visit.    Allergies:   Lisinopril, Erythromycin, and Trulicity [dulaglutide]    Social History:  The patient  reports that he quit smoking about 38 years ago. His smoking use included cigarettes. He has a 40.00  pack-year smoking history. He has never used smokeless tobacco. He reports current alcohol use. He reports that he does not use drugs.   Family History:  The patient's family history includes Alzheimer's disease in his father; Heart disease in his father; Leukemia in his sister; Lung cancer in his mother.    ROS:  Please see the history of present illness.   Otherwise, review of systems are positive for runny nose, mild orthostatic sx.   All other systems are reviewed and negative.    PHYSICAL EXAM: VS:  BP 124/62   Pulse 74   Ht 6' (1.829 m)   Wt 227 lb 12.8 oz (103.3 kg)   SpO2 96%   BMI 30.90 kg/m  , BMI Body mass index is 30.9 kg/m. GEN: Well nourished, well developed, in no acute distress  HEENT: normal  Neck: no JVD, carotid bruits, or masses Cardiac: RRR; no murmurs, rubs, or gallops,no edema  Respiratory:  clear to auscultation bilaterally, normal work of breathing GI: soft, nontender, nondistended, + BS, obese MS: no deformity or atrophy  Skin: warm and dry, no rash Neuro:  Strength and sensation are intact Psych: euthymic mood, full affect     Recent Labs: 05/15/2020: Hemoglobin 12.9; Platelets 227 06/11/2020: ALT 8; BUN 28; Creat 1.62; Potassium 4.3; Sodium 140   Lipid Panel    Component Value Date/Time   CHOL 127 06/11/2020 0823   TRIG 117 06/11/2020 0823   HDL 50 06/11/2020 0823   CHOLHDL 2.5 06/11/2020 0823   VLDL 31.0 08/07/2019 0848   LDLCALC 57 06/11/2020 0823     Other studies Reviewed: Additional studies/ records that were reviewed today with results demonstrating: LDL 57 in October 2021.   ASSESSMENT AND PLAN:  1. Mild CAD: Nonobstructive.  Still with some atypical chest tightness.  Continue preventive therapy.  2. DM: Avoid concentrated sweets.  Whole food, plant-based diet.  A1C 7.7 in 10/21.  Avoid processed foods.  3. Hyperlipidemia: Given diabetes, continue high intensity statin with rosuvastatin.   The current medical regimen is  effective;  continue present plan and medications.  LDL 57 in A1C. 4. CKD: Cr. 1.6.  Avoid nephrotoxins.  Stay well hydrated.     Current medicines are reviewed at length with the patient today.  The patient concerns regarding his medicines were addressed.  The following changes have been made:  No change  Labs/ tests ordered today include:  No orders of the defined types were placed in this encounter.   Recommend 150 minutes/week of aerobic exercise Low fat, low carb, high fiber diet recommended  Disposition:   FU in 1 year   Signed, [name],  MD  08/08/2020 9:31 AM    [address], [place], [place]  [address] Phone: [phone]; Fax: [phone]

## 2020-08-08 ENCOUNTER — Encounter: Payer: Self-pay | Admitting: Interventional Cardiology

## 2020-08-08 ENCOUNTER — Other Ambulatory Visit: Payer: Self-pay

## 2020-08-08 ENCOUNTER — Ambulatory Visit: Payer: Medicare Other | Admitting: Interventional Cardiology

## 2020-08-08 VITALS — BP 124/62 | HR 74 | Ht 72.0 in | Wt 227.8 lb

## 2020-08-08 DIAGNOSIS — I25118 Atherosclerotic heart disease of native coronary artery with other forms of angina pectoris: Secondary | ICD-10-CM | POA: Diagnosis not present

## 2020-08-08 DIAGNOSIS — E782 Mixed hyperlipidemia: Secondary | ICD-10-CM | POA: Diagnosis not present

## 2020-08-08 DIAGNOSIS — N1831 Chronic kidney disease, stage 3a: Secondary | ICD-10-CM

## 2020-08-08 DIAGNOSIS — E1159 Type 2 diabetes mellitus with other circulatory complications: Secondary | ICD-10-CM

## 2020-08-08 NOTE — Patient Instructions (Signed)
Medication Instructions:  Your physician recommends that you continue on your current medications as directed. Please refer to the Current Medication list given to you today.  *If you need a refill on your cardiac medications before your next appointment, please call your pharmacy*   Lab Work: None  If you have labs (blood work) drawn today and your tests are completely normal, you will receive your results only by: . MyChart Message (if you have MyChart) OR . A paper copy in the mail If you have any lab test that is abnormal or we need to change your treatment, we will call you to review the results.   Testing/Procedures: None  Follow-Up: At [facility], you and your health needs are our priority.  As part of our continuing mission to provide you with exceptional heart care, we have created designated Provider Care Teams.  These Care Teams include your primary Cardiologist (physician) and Advanced Practice Providers (APPs -  Physician Assistants and Nurse Practitioners) who all work together to provide you with the care you need, when you need it.  We recommend signing up for the patient portal called "MyChart".  Sign up information is provided on this After Visit Summary.  MyChart is used to connect with patients for Virtual Visits (Telemedicine).  Patients are able to view lab/test results, encounter notes, upcoming appointments, etc.  Non-urgent messages can be sent to your provider as well.   To learn more about what you can do with MyChart, go to [email or web address].    Your next appointment:   12 month(s)  The format for your next appointment:   In Person  Provider:   You may see Jayadeep Varanasi, MD or one of the following Advanced Practice Providers on your designated Care Team:    Dayna Dunn, PA-C  Michele Lenze, PA-C    Other Instructions  High-Fiber Diet Fiber, also called dietary fiber, is a type of carbohydrate that is found in fruits, vegetables, whole  grains, and beans. A high-fiber diet can have many health benefits. Your health care provider may recommend a high-fiber diet to help:  Prevent constipation. Fiber can make your bowel movements more regular.  Lower your cholesterol.  Relieve the following conditions: ? Swelling of veins in the anus (hemorrhoids). ? Swelling and irritation (inflammation) of specific areas of the digestive tract (uncomplicated diverticulosis). ? A problem of the large intestine (colon) that sometimes causes pain and diarrhea (irritable bowel syndrome, IBS).  Prevent overeating as part of a weight-loss plan.  Prevent heart disease, type 2 diabetes, and certain cancers. What is my plan? The recommended daily fiber intake in grams (g) includes:  38 g for men age 50 or younger.  30 g for men over age 50.  25 g for women age 50 or younger.  21 g for women over age 50. You can get the recommended daily intake of dietary fiber by:  Eating a variety of fruits, vegetables, grains, and beans.  Taking a fiber supplement, if it is not possible to get enough fiber through your diet. What do I need to know about a high-fiber diet?  It is better to get fiber through food sources rather than from fiber supplements. There is not a lot of research about how effective supplements are.  Always check the fiber content on the nutrition facts label of any prepackaged food. Look for foods that contain 5 g of fiber or more per serving.  Talk with a diet and nutrition specialist (dietitian) if you   have questions about specific foods that are recommended or not recommended for your medical condition, especially if those foods are not listed below.  Gradually increase how much fiber you consume. If you increase your intake of dietary fiber too quickly, you may have bloating, cramping, or gas.  Drink plenty of water. Water helps you to digest fiber. What are tips for following this plan?  Eat a wide variety of high-fiber  foods.  Make sure that half of the grains that you eat each day are whole grains.  Eat breads and cereals that are made with whole-grain flour instead of refined flour or white flour.  Eat brown rice, bulgur wheat, or millet instead of white rice.  Start the day with a breakfast that is high in fiber, such as a cereal that contains 5 g of fiber or more per serving.  Use beans in place of meat in soups, salads, and pasta dishes.  Eat high-fiber snacks, such as berries, raw vegetables, nuts, and popcorn.  Choose whole fruits and vegetables instead of processed forms like juice or sauce. What foods can I eat?  Fruits Berries. Pears. Apples. Oranges. Avocado. Prunes and raisins. Dried figs. Vegetables Sweet potatoes. Spinach. Kale. Artichokes. Cabbage. Broccoli. Cauliflower. Green peas. Carrots. Squash. Grains Whole-grain breads. Multigrain cereal. Oats and oatmeal. Brown rice. Barley. Bulgur wheat. Millet. Quinoa. Bran muffins. Popcorn. Rye wafer crackers. Meats and other proteins Navy, kidney, and pinto beans. Soybeans. Split peas. Lentils. Nuts and seeds. Dairy Fiber-fortified yogurt. Beverages Fiber-fortified soy milk. Fiber-fortified orange juice. Other foods Fiber bars. The items listed above may not be a complete list of recommended foods and beverages. Contact a dietitian for more options. What foods are not recommended? Fruits Fruit juice. Cooked, strained fruit. Vegetables Fried potatoes. Canned vegetables. Well-cooked vegetables. Grains White bread. Pasta made with refined flour. White rice. Meats and other proteins Fatty cuts of meat. Fried chicken or fried fish. Dairy Milk. Yogurt. Cream cheese. Sour cream. Fats and oils Butters. Beverages Soft drinks. Other foods Cakes and pastries. The items listed above may not be a complete list of foods and beverages to avoid. Contact a dietitian for more information. Summary  Fiber is a type of carbohydrate. It is  found in fruits, vegetables, whole grains, and beans.  There are many health benefits of eating a high-fiber diet, such as preventing constipation, lowering blood cholesterol, helping with weight loss, and reducing your risk of heart disease, diabetes, and certain cancers.  Gradually increase your intake of fiber. Increasing too fast can result in cramping, bloating, and gas. Drink plenty of water while you increase your fiber.  The best sources of fiber include whole fruits and vegetables, whole grains, nuts, seeds, and beans. This information is not intended to replace advice given to you by your health care provider. Make sure you discuss any questions you have with your health care provider. Document Revised: 06/21/2017 Document Reviewed: 06/21/2017 Elsevier Patient Education  2020 Elsevier Inc.   

## 2020-08-26 ENCOUNTER — Telehealth: Payer: Self-pay | Admitting: Pharmacist

## 2020-08-26 NOTE — Chronic Care Management (AMB) (Signed)
Chronic Care Management Pharmacy Assistant   Name: Lucas Warren  MRN: [id number] DOB: 02/09/1946  Reason for Encounter: Medication Review  PCP : [name], MD  Allergies:   Allergies  Allergen Reactions  . Lisinopril Cough  . Erythromycin     REACTION: abd pain  . Trulicity [Dulaglutide] Nausea Only    Medications: Outpatient Encounter Medications as of 08/26/2020  Medication Sig  . aspirin 81 MG tablet Take 1 tablet by mouth at bedtime.   Marland Kitchen glimepiride (AMARYL) 4 MG tablet Take 1 tablet by mouth once daily with breakfast  . glucose blood (ONETOUCH ULTRA) test strip Use one strip twice daily to check blood sugars  . JARDIANCE 10 MG TABS tablet Take 1 tablet by mouth once daily (Patient taking differently: Take 10 mg by mouth daily.)  . Lancets (ONETOUCH DELICA PLUS 123XX123) MISC USE 1  TO CHECK GLUCOSE ONCE DAILY  . Lancets (ONETOUCH DELICA PLUS 123XX123) MISC USE 1  TO CHECK GLUCOSE TWICE DAILY  . lisinopril (ZESTRIL) 10 MG tablet Take 1 tablet (10 mg total) by mouth daily.  . metFORMIN (GLUCOPHAGE) 500 MG tablet Take 2 tablets (1,000 mg total) by mouth 2 (two) times daily with a meal.  . nitroGLYCERIN (NITROSTAT) 0.4 MG SL tablet Take one tablet sublingual as needed for chest pain up to three doses as needed. (Patient taking differently: Place 0.4 mg under the tongue every 5 (five) minutes as needed for chest pain.)  . pioglitazone (ACTOS) 45 MG tablet Take 1 tablet (45 mg total) by mouth daily.  . rosuvastatin (CRESTOR) 20 MG tablet Take 1 tablet (20 mg total) by mouth daily.  . sildenafil (REVATIO) 20 MG tablet Take 2 to 5 tablets by mouth one hour prior to sexual activity as needed  . sitaGLIPtin (JANUVIA) 100 MG tablet Take 1 tablet (100 mg total) by mouth daily. (Patient taking differently: Take 100 mg by mouth at bedtime.)  . vitamin B-12 (CYANOCOBALAMIN) 1000 MCG tablet Take 1,000 mcg by mouth at bedtime.    No facility-administered encounter  medications on file as of 08/26/2020.    Current Diagnosis: Patient Active Problem List   Diagnosis Date Noted  . Erectile dysfunction 03/11/2020  . CKD (chronic kidney disease) stage 3, GFR 30-59 ml/min ([facility]) 11/06/2019  . Cough 08/19/2016  . Hemoptysis 08/19/2016  . Normocytic anemia 06/29/2016  . Hypertension 12/25/2015  . Obesity (BMI 30-39.9) 02/02/2014  . Acute URI 10/10/2012  . Overweight 04/05/2012  . Venous insufficiency 04/02/2011  . HEADACHE 02/25/2008  . CHEST PAIN, ATYPICAL 02/25/2008  . HYPERCHOLESTEROLEMIA 02/24/2008  . Poorly controlled type 2 diabetes mellitus ([place]) 02/23/2008  . ANXIETY 02/23/2008  . GERD 02/23/2008  . PSORIASIS 02/23/2008  . DEGENERATIVE JOINT DISEASE 02/23/2008  . COLONIC POLYPS, HX OF 02/23/2008    Goals Addressed   None    Reviewed chart for medication changes ahead of medication coordination call. No OVs, Consults, or hospital visits since last care coordination call/Pharmacist visit.  No medication changes indicated.  BP Readings from Last 3 Encounters:  08/08/20 124/62  06/11/20 122/80  05/20/20 (!) 111/52    Lab Results  Component Value Date   HGBA1C 7.7 (A) 06/11/2020     Patient obtains medications through Adherence Packaging  30 Days   Last adherence delivery included:  . Lisinopril (ZESTRIL) 10 mg: one tablet at dinner . Metformin (GLUCOPHAGE) 500 mg:  two (2) tablets at breakfast and dinner . Pioglitazone (ACTOS) 45 mg: one tablet at breakfast .  Rosuvastatin (CRESTOR) 20 mg: one tablet at bedtime . Glimepiride (AMARYL) 4 mg: one tablet a breakfast . Lancets (ONETOUCH DELICA PLUS [id number]) MISC: use 1 to check glucose twice daily  Patient is due for next adherence delivery on: 09/02/2020. Called patient and reviewed medications and coordinated delivery. This delivery to include: . Lisinopril (ZESTRIL) 10 mg: one tablet at dinner . Metformin (GLUCOPHAGE) 500 mg:  two (2) tablets at breakfast and  dinner . Pioglitazone (ACTOS) 45 mg: one tablet at breakfast . Rosuvastatin (CRESTOR) 20 mg: one tablet at bedtime . Glimepiride (AMARYL) 4 mg: one tablet a breakfast  I spoke with the patient and review medications. There are no changes in medications currently. He declined the following medication due to PRN and supply on hand. The patient is taking the following medications: . JARDIANCE 10 MG TABS tablet  . Nitroglycerin (NITROSTAT) 0.4 MG SL tablet . Sildenafil (REVATIO) 20 MG tablet . Sitagliptin (JANUVIA) 100 MG tablet  . Vitamin B-12 (CYANOCOBALAMIN) 1000 MCG tablet . Glucose blood (ONETOUCH ULTRA) test strip  . Lancets (ONETOUCH DELICA PLUS [id number]) MISC: use 1 to check glucose twice daily   He does not needs refills at this time. Confirmed delivery date of 09/05/2020, advised patient that pharmacy will contact them the morning of delivery.  Follow-Up:  Coordination of Enhanced Pharmacy Services and Pharmacist Review   Maia Breslow, [place] Assistant [phone]

## 2020-09-10 ENCOUNTER — Ambulatory Visit: Payer: Medicare Other | Admitting: Pharmacist

## 2020-09-10 ENCOUNTER — Encounter: Payer: Self-pay | Admitting: Family Medicine

## 2020-09-10 ENCOUNTER — Ambulatory Visit (INDEPENDENT_AMBULATORY_CARE_PROVIDER_SITE_OTHER): Payer: Medicare Other | Admitting: Family Medicine

## 2020-09-10 ENCOUNTER — Other Ambulatory Visit: Payer: Self-pay

## 2020-09-10 ENCOUNTER — Ambulatory Visit (INDEPENDENT_AMBULATORY_CARE_PROVIDER_SITE_OTHER): Payer: Medicare Other

## 2020-09-10 VITALS — BP 122/68 | HR 65 | Ht 72.0 in | Wt 223.0 lb

## 2020-09-10 DIAGNOSIS — E1165 Type 2 diabetes mellitus with hyperglycemia: Secondary | ICD-10-CM | POA: Diagnosis not present

## 2020-09-10 DIAGNOSIS — I10 Essential (primary) hypertension: Secondary | ICD-10-CM

## 2020-09-10 DIAGNOSIS — R0600 Dyspnea, unspecified: Secondary | ICD-10-CM

## 2020-09-10 DIAGNOSIS — E7800 Pure hypercholesterolemia, unspecified: Secondary | ICD-10-CM

## 2020-09-10 LAB — POCT GLYCOSYLATED HEMOGLOBIN (HGB A1C): Hemoglobin A1C: 8.3 % — AB (ref 4.0–5.6)

## 2020-09-10 NOTE — Patient Instructions (Signed)
Diabetes Mellitus and Nutrition, Adult When you have diabetes, or diabetes mellitus, it is very important to have healthy eating habits because your blood sugar (glucose) levels are greatly affected by what you eat and drink. Eating healthy foods in the right amounts, at about the same times every day, can help you:  Control your blood glucose.  Lower your risk of heart disease.  Improve your blood pressure.  Reach or maintain a healthy weight. What can affect my meal plan? Every person with diabetes is different, and each person has different needs for a meal plan. Your health care provider may recommend that you work with a dietitian to make a meal plan that is best for you. Your meal plan may vary depending on factors such as:  The calories you need.  The medicines you take.  Your weight.  Your blood glucose, blood pressure, and cholesterol levels.  Your activity level.  Other health conditions you have, such as heart or kidney disease. How do carbohydrates affect me? Carbohydrates, also called carbs, affect your blood glucose level more than any other type of food. Eating carbs naturally raises the amount of glucose in your blood. Carb counting is a method for keeping track of how many carbs you eat. Counting carbs is important to keep your blood glucose at a healthy level, especially if you use insulin or take certain oral diabetes medicines. It is important to know how many carbs you can safely have in each meal. This is different for every person. Your dietitian can help you calculate how many carbs you should have at each meal and for each snack. How does alcohol affect me? Alcohol can cause a sudden decrease in blood glucose (hypoglycemia), especially if you use insulin or take certain oral diabetes medicines. Hypoglycemia can be a life-threatening condition. Symptoms of hypoglycemia, such as sleepiness, dizziness, and confusion, are similar to symptoms of having too much  alcohol.  Do not drink alcohol if: ? Your health care provider tells you not to drink. ? You are pregnant, may be pregnant, or are planning to become pregnant.  If you drink alcohol: ? Do not drink on an empty stomach. ? Limit how much you use to:  0-1 drink a day for women.  0-2 drinks a day for men. ? Be aware of how much alcohol is in your drink. In the [place] one drink equals one 12 oz bottle of beer (355 mL), one 5 oz glass of wine (148 mL), or one 1 oz glass of hard liquor (44 mL). ? Keep yourself hydrated with water, diet soda, or unsweetened iced tea.  Keep in mind that regular soda, juice, and other mixers may contain a lot of sugar and must be counted as carbs. What are tips for following this plan? Reading food labels  Start by checking the serving size on the "Nutrition Facts" label of packaged foods and drinks. The amount of calories, carbs, fats, and other nutrients listed on the label is based on one serving of the item. Many items contain more than one serving per package.  Check the total grams (g) of carbs in one serving. You can calculate the number of servings of carbs in one serving by dividing the total carbs by 15. For example, if a food has 30 g of total carbs per serving, it would be equal to 2 servings of carbs.  Check the number of grams (g) of saturated fats and trans fats in one serving. Choose foods that have   a low amount or none of these fats.  Check the number of milligrams (mg) of salt (sodium) in one serving. Most people should limit total sodium intake to less than 2,300 mg per day.  Always check the nutrition information of foods labeled as "low-fat" or "nonfat." These foods may be higher in added sugar or refined carbs and should be avoided.  Talk to your dietitian to identify your daily goals for nutrients listed on the label. Shopping  Avoid buying canned, pre-made, or processed foods. These foods tend to be high in fat, sodium, and added  sugar.  Shop around the outside edge of the grocery store. This is where you will most often find fresh fruits and vegetables, bulk grains, fresh meats, and fresh dairy. Cooking  Use low-heat cooking methods, such as baking, instead of high-heat cooking methods like deep frying.  Cook using healthy oils, such as olive, canola, or sunflower oil.  Avoid cooking with butter, cream, or high-fat meats. Meal planning  Eat meals and snacks regularly, preferably at the same times every day. Avoid going long periods of time without eating.  Eat foods that are high in fiber, such as fresh fruits, vegetables, beans, and whole grains. Talk with your dietitian about how many servings of carbs you can eat at each meal.  Eat 4-6 oz (112-168 g) of lean protein each day, such as lean meat, chicken, fish, eggs, or tofu. One ounce (oz) of lean protein is equal to: ? 1 oz (28 g) of meat, chicken, or fish. ? 1 egg. ?  cup (62 g) of tofu.  Eat some foods each day that contain healthy fats, such as avocado, nuts, seeds, and fish.   What foods should I eat? Fruits Berries. Apples. Oranges. Peaches. Apricots. Plums. Grapes. Mango. Papaya. Pomegranate. Kiwi. Cherries. Vegetables Lettuce. Spinach. Leafy greens, including kale, chard, collard greens, and mustard greens. Beets. Cauliflower. Cabbage. Broccoli. Carrots. Green beans. Tomatoes. Peppers. Onions. Cucumbers. Brussels sprouts. Grains Whole grains, such as whole-wheat or whole-grain bread, crackers, tortillas, cereal, and pasta. Unsweetened oatmeal. Quinoa. Brown or wild rice. Meats and other proteins Seafood. Poultry without skin. Lean cuts of poultry and beef. Tofu. Nuts. Seeds. Dairy Low-fat or fat-free dairy products such as milk, yogurt, and cheese. The items listed above may not be a complete list of foods and beverages you can eat. Contact a dietitian for more information. What foods should I avoid? Fruits Fruits canned with  syrup. Vegetables Canned vegetables. Frozen vegetables with butter or cream sauce. Grains Refined white flour and flour products such as bread, pasta, snack foods, and cereals. Avoid all processed foods. Meats and other proteins Fatty cuts of meat. Poultry with skin. Breaded or fried meats. Processed meat. Avoid saturated fats. Dairy Full-fat yogurt, cheese, or milk. Beverages Sweetened drinks, such as soda or iced tea. The items listed above may not be a complete list of foods and beverages you should avoid. Contact a dietitian for more information. Questions to ask a health care provider  Do I need to meet with a diabetes educator?  Do I need to meet with a dietitian?  What number can I call if I have questions?  When are the best times to check my blood glucose? Where to find more information:  American Diabetes Association: diabetes.org  Academy of Nutrition and Dietetics: www.eatright.org  National Institute of Diabetes and Digestive and Kidney Diseases: www.niddk.nih.gov  Association of Diabetes Care and Education Specialists: www.diabeteseducator.org Summary  It is important to have healthy eating   habits because your blood sugar (glucose) levels are greatly affected by what you eat and drink.  A healthy meal plan will help you control your blood glucose and maintain a healthy lifestyle.  Your health care provider may recommend that you work with a dietitian to make a meal plan that is best for you.  Keep in mind that carbohydrates (carbs) and alcohol have immediate effects on your blood glucose levels. It is important to count carbs and to use alcohol carefully. This information is not intended to replace advice given to you by your health care provider. Make sure you discuss any questions you have with your health care provider. Document Revised: 07/25/2019 Document Reviewed: 07/25/2019 Elsevier Patient Education  2021 [place].  Step up exercise  Scale back  sugars and starches  If A1C not improved at 3 month follow up we need to add to or increase current medications.

## 2020-09-10 NOTE — Chronic Care Management (AMB) (Signed)
Chronic Care Management Pharmacy  Name: Lucas Warren  MRN: [id number] DOB: 26-Jul-1946  Initial Questions: 1. Have you seen any other providers since your last visit? Yes  2. Any changes in your medicines or health? Yes   Chief Complaint/ HPI Lucas Warren,  75 y.o. , male presents for their Follow-Up CCM visit with the clinical pharmacist via telephone due to COVID-19 Pandemic.   PCP : [name], MD  Their chronic conditions include: DM, HTN, HLD, CKD stage 3  Office Visits: 06/11/20 [name], MD: Patient presented for DM follow up. A1c of 7.7%. No medication changes made.  05/03/2020 - [name], MD - Patient presented for chest pressure and shortness of breath for the past 3-4 weeks. Restarted aspirin 81 mg daily and started PRN nitroglycerin SL.  04/26/20 Ofilia Neas, LPN - Patient presented for AWV. No medication changes were made.  03/11/2020- [name], MD- Patient presented for office visit for diabetes. A1c: 7.9% (worsened). Patient reluctant to add medications. Patient opted for 3 month trial of addition weight loss. Patient to follow up in 3 months.   11/06/2019- [name], MD- Patient presented for office visit for follow up. Patient to continue current medications. Patient was encouraged to step up exercise and aim to lose weight. Plan for 4 month follow up. No additional DM medications added due to A1c improvement.   08/07/2019- [name], MD- Patient presented for office visit for follow up. Patient needing refills for DM medications. Patient reluctant to start additional DM medications. Patient to step up exercise such as walking. Recommend 3 month follow up. Labs to be checked: lipid, hepatic, BMET.   Consult visits: 08/08/20 05/07/20 [name], MD (cardiology): Patient presented for follow up.  No changes made.  05/07/20 [name], MD (cardiology): Patient presented with chest pressure and initial work up.  Switched pravastatin 40 mg to rosuvastatin 20 mg and started isosorbide mononitrate 30 mg daily.   Medications: Outpatient Encounter Medications as of 09/10/2020  Medication Sig  . aspirin 81 MG tablet Take 1 tablet by mouth at bedtime.   Marland Kitchen glimepiride (AMARYL) 4 MG tablet Take 1 tablet by mouth once daily with breakfast  . glucose blood (ONETOUCH ULTRA) test strip Use one strip twice daily to check blood sugars  . JARDIANCE 10 MG TABS tablet Take 1 tablet by mouth once daily (Patient taking differently: Take 10 mg by mouth daily.)  . Lancets (ONETOUCH DELICA PLUS 123XX123) MISC USE 1  TO CHECK GLUCOSE ONCE DAILY  . Lancets (ONETOUCH DELICA PLUS 123XX123) MISC USE 1  TO CHECK GLUCOSE TWICE DAILY  . lisinopril (ZESTRIL) 10 MG tablet Take 1 tablet (10 mg total) by mouth daily.  . metFORMIN (GLUCOPHAGE) 500 MG tablet Take 2 tablets (1,000 mg total) by mouth 2 (two) times daily with a meal.  . nitroGLYCERIN (NITROSTAT) 0.4 MG SL tablet Take one tablet sublingual as needed for chest pain up to three doses as needed. (Patient taking differently: Place 0.4 mg under the tongue every 5 (five) minutes as needed for chest pain.)  . pioglitazone (ACTOS) 45 MG tablet Take 1 tablet (45 mg total) by mouth daily.  . rosuvastatin (CRESTOR) 20 MG tablet Take 1 tablet (20 mg total) by mouth daily.  . sildenafil (REVATIO) 20 MG tablet Take 2 to 5 tablets by mouth one hour prior to sexual activity as needed  . sitaGLIPtin (JANUVIA) 100 MG tablet Take 1 tablet (100 mg total) by mouth daily. (Patient taking differently: Take  100 mg by mouth at bedtime.)  . vitamin B-12 (CYANOCOBALAMIN) 1000 MCG tablet Take 1,000 mcg by mouth at bedtime.    No facility-administered encounter medications on file as of 09/10/2020.   Patient has [employer] and reports copay for Vania Rea is cost prohibitive at this time.  Reviewed application process for [employer] patient assistance program. Patient meets income/out of pocket spend  criteria for the program. Patient will provide proof of income, out of pocket spend report, and will sign application. Will collaborate with prescriber Dr. Elease Hashimoto for the provider portion of application. Once completed, application will be submitted via Fax    Current Diagnosis/Assessment:  Goals Addressed            This Visit's Progress   . Pharmacy Care Plan       CARE PLAN ENTRY  Current Barriers:  . Chronic Disease Management support, education, and care coordination needs related to Hypertension, Hyperlipidemia, Diabetes, and Chronic Kidney Disease   Hypertension . Pharmacist Clinical Goal(s): o Over the next 90 days, patient will work with PharmD and providers to maintain BP goal <130/80 . Current regimen:  o Lisinopril 10mg , 1 tablet once daily . Interventions: o We discussed the importance of checking blood pressure at home. . Patient self care activities - Over the next 90 days, patient will: o Check BP weekly, document, and provide at future appointments o Ensure daily salt intake < 2300 mg/day  Hyperlipidemia . Pharmacist Clinical Goal(s): o Over the next 90 days, patient will work with PharmD and providers to maintain LDL goal < 100 . Current regimen:  o Rosuvastatin 20mg , 1 tablet once daily . Patient self care activities - Over the next 90 days, patient will: o Continue current medications.   Diabetes . Pharmacist Clinical Goal(s): o Over the next 90 days, patient will work with PharmD and providers to achieve A1c goal <7% . Current regimen:   Glimepiride 4mg , 1 tablet once daily with breakfast  Jardiance 10mg , 1 tablet once daily  Metformin 500mg , 2 tablets twice daily  Pioglitazone (Actos) 45mg , 1 tablet once daily  sitagliptin (Januvia) 100mg , 1 tablet once daily . Interventions: o Discussed following the healthy plate method which includes: . Fill half of your plate with nonstarchy vegetables, such as spinach, broccoli, carrots and  tomatoes. Venida Jarvis a quarter of your plate with a protein, such as tuna, lean pork or chicken. Venida Jarvis the last quarter with a whole-grain item, such as brown rice, or a starchy vegetable, such as green peas or potatoes. . Include "good" fats such as nuts or avocados in small amounts. o Discussed patient assistance requirements and filled out paperwork for Jardiance and Ozempic . Patient self care activities - Over the next 90 days, patient will: o Check blood sugar once daily, document, and provide at future appointments o Contact provider with any episodes of hypoglycemia  Chronic kidney disease . Pharmacist Clinical Goal(s) o Over the next 90 days, patient will work with PharmD and providers to maintain kidney function.  . Patient self care activities o Patient will continue to maintain proper hydration and avoid medications like NSAIDs (ibuprofen, naproxen, etc.).   Medication management . Pharmacist Clinical Goal(s): o Over the next 90 days, patient will work with PharmD and providers to maintain optimal medication adherence . Current pharmacy: [facility] . Interventions o Comprehensive medication review performed. o Continue current medication management strategy . Patient self care activities - Over the next 90 days, patient will: o Take medications  as prescribed o Report any questions or concerns to PharmD and/or provider(s)  Please see past updates related to this goal by clicking on the "Past Updates" button in the selected goal            Diabetes   Recent Relevant Labs: Lab Results  Component Value Date/Time   HGBA1C 8.3 (A) 09/10/2020 08:05 AM   HGBA1C 7.7 (A) 06/11/2020 08:17 AM   HGBA1C 8.3 (H) 12/25/2015 08:39 AM   HGBA1C 8.4 10/14/2015 12:00 AM   HGBA1C 7.3 (H) 05/22/2015 09:53 AM   MICROALBUR 2.5 06/11/2020 08:23 AM   MICROALBUR 4.3 (H) 01/10/2018 08:54 AM    Checking BG: Daily  Recent FBS Readings: Reports BGs have been around the same as last  visit.   Previous FBS readings:   n/a)  Recent PPBG readings: < 180  Patient has failed these meds in past: Trulicity (nausea), Onglyza (insurance formulary)    Patient is currently uncontrolled on the following medications:   Glimepiride 4mg , 1 tablet once daily with breakfast  Empagliflozin (Jardiance) 10mg , 1 tablet once daily  Metformin 500mg , 2 tablets twice daily  Pioglitazone (Actos) 45mg , 1 tablet once daily  Sitagliptin (Januvia) 100mg , 1 tablet once daily   Last diabetic Eye exam:  Lab Results  Component Value Date/Time   HMDIABEYEEXA No Retinopathy 04/02/2020 12:00 AM    Last diabetic Foot exam:  Lab Results  Component Value Date/Time   HMDIABFOOTEX normal 03/25/2016 12:00 AM    We discussed: diet and exercise extensively (food options for breakfast and lunch are high in carbohydrates.) -Exercise: patient's goal is to start regular exercise -Discussed patient assistance requirements for NovoNordisk and the benefits of being on GLP1 therapy. Patient agreed to try Ozempic as he was not successful with Trulicity  Plan Continue current medications  Will start Ozempic once patient assistance is processed and he runs out of Januvia.  Hypertension   Denies persistent dizziness/lightheadedness.   Office blood pressures are  BP Readings from Last 3 Encounters:  09/10/20 122/68  08/08/20 124/62  06/11/20 122/80   Patient has failed these meds in the past: none  Patient checks BP at home does not check at home   Patient home BP readings are ranging: NA  Patient is controlled on:   Lisinopril 10mg , 1 tablet once daily  Isosorbide mononitrate 30 mg, 1 tablet once daily  We discussed fall risk precautions and taking time before taking first step after standing up.  Plan Continue current medications  Recommended for patient to start checking blood pressure at home and recording.  Hyperlipidemia  LDL goal < 70  Lipid Panel     Component Value  Date/Time   CHOL 127 06/11/2020 0823   TRIG 117 06/11/2020 0823   HDL 50 06/11/2020 0823   LDLCALC 57 06/11/2020 0823    The ASCVD Risk score (Goff DC Jr., et al., 2013) failed to calculate for the following reasons:   The valid total cholesterol range is 130 to 320 mg/dL   Patient has failed these meds in past: rosuvastatin (elevated BGs), simvastatin (myalgia)  Patient is currently uncontrolled on the following medications:  . Rosuvastatin 20mg , 1 tablet once daily  We discussed:  diet and exercise extensively (see above).   Plan Continue current medications   Primary prevention ASCVD   Patient is currently controlled on the following medications:   Aspirin 81mg , 1 tablet daily  Plan Continue current medications  OTC/ supplements  Patient is currently on the following medications:  .  Vitamin B12 1057mcg, 1 tablet once daily    Plan Continue current medications   CKD, Stage 3   Kidney Function Lab Results  Component Value Date/Time   CREATININE 1.62 (H) 06/11/2020 08:31 AM   CREATININE 1.82 (H) 05/15/2020 10:45 AM   CREATININE 1.61 (H) 08/15/2019 07:32 AM   GFR 42.45 (L) 08/15/2019 07:32 AM   GFRNONAA 42 (L) 06/11/2020 08:31 AM   GFRAA 48 (L) 06/11/2020 08:31 AM   Plan Continue to monitor and adjust medications as needed.  Based on most recent renal function, it may not be beneficial to increase Jardiance.  Medication Management   Pt uses [facility] for all medications Uses pill box? Yes - adherence packaging  Plan  Utilize [facility] for medication synchronization, packaging and delivery   Follow up: 2 month phone visit for DM check in/patient assistance follow up   [name], PharmD Clinical Pharmacist [facility] [phone]

## 2020-09-10 NOTE — Progress Notes (Signed)
Established Patient Office Visit  Subjective:  Patient ID: Lucas Warren, male    DOB: July 24, 1946  Age: 75 y.o. MRN: [id number]  CC:  Chief Complaint  Patient presents with  . Diabetes    HPI KINGSTEN ENFIELD presents for medical follow-up.  He has type 2 diabetes, hypertension, dyslipidemia, chronic kidney disease stage III.  He had cardiac cath back in September which fortunately did not show any critical stenosis.  10% proximal LAD lesion and ostial RCA to proximal RCA lesion 10% stenosis.  Mid circumflex lesion 10% stenosed.  Ejection fraction 55 to 65%.  He continues to have some dyspnea with exertion.  Very sedentary.  He has a treadmill but not using this.  Occasional chest pressure.  Not consistently having chest pressure with activity.  His cardiologist had requested that he get chest x-ray.  No orthopnea.  History of suboptimal control diabetes.  He has been very reluctant to add additional medication.  Poor compliance with diet over the winter.  Prior intolerance of Trulicity.  Currently on multiple medications including metformin, Actos, Januvia, glimepiride, Jardiance.  Recent hypoglycemia.  Lipids were checked recently and at goal with LDL less than 70.  He takes rosuvastatin for that.  No myalgias.  Past Medical History:  Diagnosis Date  . Allergy    seasonal  . Anxiety   . Chest pain, atypical   . Colon polyps    hyperplastic 2003  . DJD (degenerative joint disease)   . DM (diabetes mellitus) ([place])   . GERD (gastroesophageal reflux disease)   . History of headache   . Hypercholesteremia   . Psoriasis     Past Surgical History:  Procedure Laterality Date  . CATARACT EXTRACTION, BILATERAL Bilateral    Dr. Tommy Rainwater;   . INGUINAL HERNIA REPAIR  1989  . LEFT HEART CATH AND CORONARY ANGIOGRAPHY N/A 05/20/2020   Procedure: LEFT HEART CATH AND CORONARY ANGIOGRAPHY;  Surgeon: Jettie Booze, MD;  Location: [facility];  Service: Cardiovascular;  Laterality:  N/A;  . NASAL SEPTUM SURGERY  1989    Family History  Problem Relation Age of Onset  . Lung cancer Mother   . Alzheimer's disease Father   . Heart disease Father   . Leukemia Sister     Social History   Socioeconomic History  . Marital status: Divorced    Spouse name: carolyn  . Number of children: Not on file  . Years of education: Not on file  . Highest education level: Not on file  Occupational History    Comment: Patent examiner  Tobacco Use  . Smoking status: Former Smoker    Packs/day: 2.00    Years: 20.00    Pack years: 40.00    Types: Cigarettes    Quit date: 08/31/1981    Years since quitting: 39.0  . Smokeless tobacco: Never Used  Vaping Use  . Vaping Use: Never used  Substance and Sexual Activity  . Alcohol use: Yes    Comment: rarely a beer  . Drug use: No  . Sexual activity: Not on file  Other Topics Concern  . Not on file  Social History Narrative  . Not on file   Social Determinants of Health   Financial Resource Strain: Low Risk   . Difficulty of Paying Living Expenses: Not hard at all  Food Insecurity: No Food Insecurity  . Worried About Charity fundraiser in the Last Year: Never true  . Ran Out of  Food in the Last Year: Never true  Transportation Needs: No Transportation Needs  . Lack of Transportation (Medical): No  . Lack of Transportation (Non-Medical): No  Physical Activity: Inactive  . Days of Exercise per Week: 0 days  . Minutes of Exercise per Session: 0 min  Stress: No Stress Concern Present  . Feeling of Stress : Not at all  Social Connections: Moderately Integrated  . Frequency of Communication with Friends and Family: More than three times a week  . Frequency of Social Gatherings with Friends and Family: Three times a week  . Attends Religious Services: More than 4 times per year  . Active Member of Clubs or Organizations: Yes  . Attends Archivist Meetings: More than 4 times per year  . Marital Status:  Divorced  Human resources officer Violence: Not At Risk  . Fear of Current or Ex-Partner: No  . Emotionally Abused: No  . Physically Abused: No  . Sexually Abused: No    Outpatient Medications Prior to Visit  Medication Sig Dispense Refill  . aspirin 81 MG tablet Take 1 tablet by mouth at bedtime.     Marland Kitchen glimepiride (AMARYL) 4 MG tablet Take 1 tablet by mouth once daily with breakfast 90 tablet 3  . glucose blood (ONETOUCH ULTRA) test strip Use one strip twice daily to check blood sugars 180 each 3  . JARDIANCE 10 MG TABS tablet Take 1 tablet by mouth once daily (Patient taking differently: Take 10 mg by mouth daily.) 90 tablet 0  . Lancets (ONETOUCH DELICA PLUS 123XX123) MISC USE 1  TO CHECK GLUCOSE ONCE DAILY 100 each 4  . Lancets (ONETOUCH DELICA PLUS 123XX123) MISC USE 1  TO CHECK GLUCOSE TWICE DAILY 180 each 3  . lisinopril (ZESTRIL) 10 MG tablet Take 1 tablet (10 mg total) by mouth daily. 90 tablet 3  . metFORMIN (GLUCOPHAGE) 500 MG tablet Take 2 tablets (1,000 mg total) by mouth 2 (two) times daily with a meal. 180 tablet 3  . nitroGLYCERIN (NITROSTAT) 0.4 MG SL tablet Take one tablet sublingual as needed for chest pain up to three doses as needed. (Patient taking differently: Place 0.4 mg under the tongue every 5 (five) minutes as needed for chest pain.) 20 tablet 0  . pioglitazone (ACTOS) 45 MG tablet Take 1 tablet (45 mg total) by mouth daily. 90 tablet 3  . rosuvastatin (CRESTOR) 20 MG tablet Take 1 tablet (20 mg total) by mouth daily. 90 tablet 3  . sildenafil (REVATIO) 20 MG tablet Take 2 to 5 tablets by mouth one hour prior to sexual activity as needed 50 tablet 3  . sitaGLIPtin (JANUVIA) 100 MG tablet Take 1 tablet (100 mg total) by mouth daily. (Patient taking differently: Take 100 mg by mouth at bedtime.) 90 tablet 3  . vitamin B-12 (CYANOCOBALAMIN) 1000 MCG tablet Take 1,000 mcg by mouth at bedtime.      No facility-administered medications prior to visit.    Allergies   Allergen Reactions  . Lisinopril Cough  . Erythromycin     REACTION: abd pain  . Trulicity [Dulaglutide] Nausea Only    ROS Review of Systems  Constitutional: Negative for chills, fatigue, fever and unexpected weight change.  Eyes: Negative for visual disturbance.  Respiratory: Positive for shortness of breath. Negative for cough, chest tightness and wheezing.   Cardiovascular: Negative for chest pain, palpitations and leg swelling.  Neurological: Negative for dizziness, syncope, weakness, light-headedness and headaches.      Objective:  Physical Exam Constitutional:      Appearance: He is well-developed and well-nourished.  HENT:     Right Ear: External ear normal.     Left Ear: External ear normal.     Mouth/Throat:     Mouth: Oropharynx is clear and moist.  Eyes:     Pupils: Pupils are equal, round, and reactive to light.  Neck:     Thyroid: No thyromegaly.  Cardiovascular:     Rate and Rhythm: Normal rate and regular rhythm.  Pulmonary:     Effort: Pulmonary effort is normal. No respiratory distress.     Breath sounds: Normal breath sounds. No wheezing or rales.  Musculoskeletal:        General: No edema.     Cervical back: Neck supple.  Skin:    Comments: Feet reveal no skin lesions. Good distal foot pulses. Good capillary refill. No calluses. Normal sensation with monofilament testing   Neurological:     Mental Status: He is alert and oriented to person, place, and time.     BP 122/68   Pulse 65   Ht 6' (1.829 m)   Wt 223 lb (101.2 kg)   SpO2 99%   BMI 30.24 kg/m  Wt Readings from Last 3 Encounters:  09/10/20 223 lb (101.2 kg)  08/08/20 227 lb 12.8 oz (103.3 kg)  06/11/20 229 lb (103.9 kg)     Health Maintenance Due  Topic Date Due  . COVID-19 Vaccine (3 - Booster for Pfizer series) 05/17/2020    There are no preventive care reminders to display for this patient.  Lab Results  Component Value Date   TSH 0.86 07/24/2015   Lab Results   Component Value Date   WBC 8.3 05/15/2020   HGB 12.9 (L) 05/15/2020   HCT 38.6 05/15/2020   MCV 86 05/15/2020   PLT 227 05/15/2020   Lab Results  Component Value Date   NA 140 06/11/2020   K 4.3 06/11/2020   CO2 23 06/11/2020   GLUCOSE 95 06/11/2020   BUN 28 (H) 06/11/2020   CREATININE 1.62 (H) 06/11/2020   BILITOT 0.4 06/11/2020   ALKPHOS 51 08/07/2019   AST 12 06/11/2020   ALT 8 (L) 06/11/2020   PROT 6.7 06/11/2020   ALBUMIN 4.5 08/07/2019   CALCIUM 9.1 06/11/2020   GFR 42.45 (L) 08/15/2019   Lab Results  Component Value Date   CHOL 127 06/11/2020   Lab Results  Component Value Date   HDL 50 06/11/2020   Lab Results  Component Value Date   LDLCALC 57 06/11/2020   Lab Results  Component Value Date   TRIG 117 06/11/2020   Lab Results  Component Value Date   CHOLHDL 2.5 06/11/2020   Lab Results  Component Value Date   HGBA1C 8.3 (A) 09/10/2020      Assessment & Plan:   #1 type 2 diabetes suboptimally controlled.  A1c today 8.3%  -We strongly encouraged him to consider further titration of Jardiance to 25 mg or possibly additional medication but he is very reluctant.  He would like to try 3 months of exercise and weight loss.  He meets with our clinical pharmacist later today and can discuss further at that time other options.  He has had prior intolerance with Trulicity.  #2 dyspnea.  Recent cardiac cath revealed no significant critical coronary stenosis.  Recent EF 55 to 65% by cath -Obtain chest x-ray -This may be deconditioning and we recommend he start exercise program and build  up gradually  #3 hypertension stable and at goal  #4 dyslipidemia.  Recent lipids at goal on rosuvastatin   No orders of the defined types were placed in this encounter.   Follow-up: Return in about 3 months (around 12/09/2020).    [name], MD

## 2020-09-20 NOTE — Patient Instructions (Addendum)
Hi Lucas Warren,  It was great to finally see you in person! As we discussed, go ahead and continue with what you have for your diabetes medications and then we will switch over to Ozempic. I will likely be in touch with you sooner than 2 months but I put a follow up on my calendar for February before your appointment with Dr. Elease Hashimoto just so we have a plan in place.  Please reach out to me if you have any questions or concerns before our follow up!  Best, Maddie  [name], PharmD [facility] Clinical Pharmacist [place] [facility] [place]   Visit Information  Goals Addressed            This Visit's Progress   . Pharmacy Care Plan       CARE PLAN ENTRY  Current Barriers:  . Chronic Disease Management support, education, and care coordination needs related to Hypertension, Hyperlipidemia, Diabetes, and Chronic Kidney Disease   Hypertension . Pharmacist Clinical Goal(s): o Over the next 90 days, patient will work with PharmD and providers to maintain BP goal <130/80 . Current regimen:  o Lisinopril 10mg , 1 tablet once daily . Interventions: o We discussed the importance of checking blood pressure at home. . Patient self care activities - Over the next 90 days, patient will: o Check BP weekly, document, and provide at future appointments o Ensure daily salt intake < 2300 mg/day  Hyperlipidemia . Pharmacist Clinical Goal(s): o Over the next 90 days, patient will work with PharmD and providers to maintain LDL goal < 100 . Current regimen:  o Rosuvastatin 20mg , 1 tablet once daily . Patient self care activities - Over the next 90 days, patient will: o Continue current medications.   Diabetes . Pharmacist Clinical Goal(s): o Over the next 90 days, patient will work with PharmD and providers to achieve A1c goal <7% . Current regimen:   Glimepiride 4mg , 1 tablet once daily with breakfast  Jardiance 10mg , 1 tablet once daily  Metformin 500mg , 2 tablets  twice daily  Pioglitazone (Actos) 45mg , 1 tablet once daily  sitagliptin (Januvia) 100mg , 1 tablet once daily . Interventions: o Discussed following the healthy plate method which includes: . Fill half of your plate with nonstarchy vegetables, such as spinach, broccoli, carrots and tomatoes. Venida Jarvis a quarter of your plate with a protein, such as tuna, lean pork or chicken. Venida Jarvis the last quarter with a whole-grain item, such as brown rice, or a starchy vegetable, such as green peas or potatoes. . Include "good" fats such as nuts or avocados in small amounts. o Discussed patient assistance requirements and filled out paperwork for Jardiance and Ozempic . Patient self care activities - Over the next 90 days, patient will: o Check blood sugar once daily, document, and provide at future appointments o Contact provider with any episodes of hypoglycemia  Chronic kidney disease . Pharmacist Clinical Goal(s) o Over the next 90 days, patient will work with PharmD and providers to maintain kidney function.  . Patient self care activities o Patient will continue to maintain proper hydration and avoid medications like NSAIDs (ibuprofen, naproxen, etc.).   Medication management . Pharmacist Clinical Goal(s): o Over the next 90 days, patient will work with PharmD and providers to maintain optimal medication adherence . Current pharmacy: [facility] . Interventions o Comprehensive medication review performed. o Continue current medication management strategy . Patient self care activities - Over the next 90 days, patient will: o Take medications as prescribed o  Report any questions or concerns to PharmD and/or provider(s)  Please see past updates related to this goal by clicking on the "Past Updates" button in the selected goal         The patient verbalized understanding of instructions, educational materials, and care plan provided today and declined offer to receive copy of patient  instructions, educational materials, and care plan.   Telephone follow up appointment with pharmacy team member scheduled for: 2 months  Viona Gilmore, [facility]

## 2020-09-23 MED ORDER — OZEMPIC (0.25 OR 0.5 MG/DOSE) 2 MG/1.5ML ~~LOC~~ SOPN: PEN_INJECTOR | SUBCUTANEOUS | 11 refills | Status: DC

## 2020-09-23 NOTE — Progress Notes (Signed)
I sent in the [place] prescription.

## 2020-09-23 NOTE — Addendum Note (Signed)
Addended by: [name] on: 09/23/2020 08:51 PM   Modules accepted: Orders

## 2020-09-26 ENCOUNTER — Telehealth: Payer: Self-pay | Admitting: *Deleted

## 2020-09-26 MED ORDER — EMPAGLIFLOZIN 10 MG PO TABS: 10.0000 mg | ORAL_TABLET | Freq: Every day | ORAL | 0 refills | Status: DC

## 2020-09-26 NOTE — Telephone Encounter (Signed)
-----   Message from Viona Gilmore, [facility] sent at 09/25/2020  5:28 PM EST ----- Regarding: Jardiance samples Hi,  Can you please put in a Jardiance 10 mg sample for Mr. Foisy, one of Dr. Erick Blinks patients? I gave him 3 boxes (7 tablets in each box) with lot [id number] and exp: 03/2022.  Thank you, Maddie

## 2020-09-27 ENCOUNTER — Telehealth: Payer: Self-pay | Admitting: Pharmacist

## 2020-09-27 NOTE — Chronic Care Management (AMB) (Signed)
Chronic Care Management Pharmacy Assistant   Name: Lucas Warren  MRN: [id number] DOB: 11/08/1945  Reason for Encounter: Medication Review  PCP : [name], MD  Allergies:   Allergies  Allergen Reactions  . Lisinopril Cough  . Erythromycin     REACTION: abd pain  . Trulicity [Dulaglutide] Nausea Only    Medications: Outpatient Encounter Medications as of 09/27/2020  Medication Sig  . aspirin 81 MG tablet Take 1 tablet by mouth at bedtime.   . empagliflozin (JARDIANCE) 10 MG TABS tablet Take 1 tablet (10 mg total) by mouth daily.  Marland Kitchen glimepiride (AMARYL) 4 MG tablet Take 1 tablet by mouth once daily with breakfast  . glucose blood (ONETOUCH ULTRA) test strip Use one strip twice daily to check blood sugars  . Lancets (ONETOUCH DELICA PLUS [id number]) MISC USE 1  TO CHECK GLUCOSE ONCE DAILY  . Lancets (ONETOUCH DELICA PLUS [id number]) MISC USE 1  TO CHECK GLUCOSE TWICE DAILY  . lisinopril (ZESTRIL) 10 MG tablet Take 1 tablet (10 mg total) by mouth daily.  . metFORMIN (GLUCOPHAGE) 500 MG tablet Take 2 tablets (1,000 mg total) by mouth 2 (two) times daily with a meal.  . nitroGLYCERIN (NITROSTAT) 0.4 MG SL tablet Take one tablet sublingual as needed for chest pain up to three doses as needed. (Patient taking differently: Place 0.4 mg under the tongue every 5 (five) minutes as needed for chest pain.)  . pioglitazone (ACTOS) 45 MG tablet Take 1 tablet (45 mg total) by mouth daily.  . rosuvastatin (CRESTOR) 20 MG tablet Take 1 tablet (20 mg total) by mouth daily.  . Semaglutide,0.25 or 0.5MG /DOS, (OZEMPIC, 0.25 OR 0.5 MG/DOSE,) 2 MG/1.5ML SOPN Start 0.25 mg [place] once weekly for 4 weeks and then increase to 0.5 mg [place] weekly.  . sildenafil (REVATIO) 20 MG tablet Take 2 to 5 tablets by mouth one hour prior to sexual activity as needed  . sitaGLIPtin (JANUVIA) 100 MG tablet Take 1 tablet (100 mg total) by mouth daily. (Patient taking differently: Take 100 mg by mouth at bedtime.)  .  vitamin B-12 (CYANOCOBALAMIN) 1000 MCG tablet Take 1,000 mcg by mouth at bedtime.    No facility-administered encounter medications on file as of 09/27/2020.    Current Diagnosis: Patient Active Problem List   Diagnosis Date Noted  . Erectile dysfunction 03/11/2020  . CKD (chronic kidney disease) stage 3, GFR 30-59 ml/min (HCC) 11/06/2019  . Laryngopharyngeal reflux (LPR) 06/12/2019  . Perennial allergic rhinitis 06/12/2019  . Chronic right shoulder pain 09/24/2016  . Cough 08/19/2016  . Hemoptysis 08/19/2016  . Normocytic anemia 06/29/2016  . Hypertension 12/25/2015  . Obesity (BMI 30-39.9) 02/02/2014  . Acute URI 10/10/2012  . Overweight 04/05/2012  . Venous insufficiency 04/02/2011  . HEADACHE 02/25/2008  . CHEST PAIN, ATYPICAL 02/25/2008  . HYPERCHOLESTEROLEMIA 02/24/2008  . Poorly controlled type 2 diabetes mellitus ([place]) 02/23/2008  . ANXIETY 02/23/2008  . GERD 02/23/2008  . PSORIASIS 02/23/2008  . DEGENERATIVE JOINT DISEASE 02/23/2008  . COLONIC POLYPS, HX OF 02/23/2008    Goals Addressed   None    Reviewed chart for medication changes ahead of medication coordination call. No OVs, Consults, or hospital visits since last care coordination call/Pharmacist visit.  No medication changes indicated.  BP Readings from Last 3 Encounters:  09/10/20 122/68  08/08/20 124/62  06/11/20 122/80    Lab Results  Component Value Date   HGBA1C 8.3 (A) 09/10/2020     Patient obtains medications through Adherence  Packaging  30 Days   Last adherence delivery included:  . Lisinopril (ZESTRIL) 10 mg: one tablet at dinner . Metformin (GLUCOPHAGE) 500 mg:  two (2) tablets at breakfast and dinner . Pioglitazone (ACTOS) 45 mg: one tablet at breakfast . Rosuvastatin (CRESTOR) 20 mg: one tablet at bedtime . Glimepiride (AMARYL) 4 mg: one tablet a breakfast  Patient declined the following medications last month due to PRN use/additional supply on hand. Marland Kitchen JARDIANCE 10 MG TABS  tablet  . Nitroglycerin (NITROSTAT) 0.4 MG SL tablet . Sildenafil (REVATIO) 20 MG tablet . Sitagliptin (JANUVIA) 100 MG tablet  . Vitamin B-12 (CYANOCOBALAMIN) 1000 MCG tablet . Glucose blood (ONETOUCH ULTRA) test strip  . Lancets (ONETOUCH DELICA PLUS [id number]) MISC: use 1 to check glucose twice daily       Patient is due for next adherence delivery on: 10/04/2020. Called patient and reviewed medications and coordinated delivery. This delivery to include: . Lisinopril (ZESTRIL) 10 mg: one tablet at dinner . Metformin (GLUCOPHAGE) 500 mg:  two (2) tablets at breakfast and dinner . Pioglitazone (ACTOS) 45 mg: one tablet at breakfast . Rosuvastatin (CRESTOR) 20 mg: one tablet at bedtime . Glimepiride (AMARYL) 4 mg: one tablet a breakfast . Glucose blood (ONETOUCH ULTRA) test strip  I spoke with the patient and review medications. There are no changes in medications currently. Patient declined these last month due to PRN use/additional supply on hand.The patient is taking the following medications: . JARDIANCE 10 MG TABS tablet  . Nitroglycerin (NITROSTAT) 0.4 MG SL tablet . Sildenafil (REVATIO) 20 MG tablet . Sitagliptin (JANUVIA) 100 MG tablet  . Vitamin B-12 (CYANOCOBALAMIN) 1000 MCG tablet . Lancets (ONETOUCH DELICA PLUS [id number]) MISC: use 1 to check glucose twice daily  He currently does not need refills Confirmed delivery date of 10/04/2020, advised patient that pharmacy will contact them the morning of delivery.  Follow-Up:  Coordination of Enhanced Pharmacy Services and Pharmacist Review  Maia Breslow, [place] Assistant [phone]

## 2020-10-04 ENCOUNTER — Telehealth: Payer: Self-pay | Admitting: Pharmacist

## 2020-10-07 NOTE — Chronic Care Management (AMB) (Signed)
Chronic Care Management Pharmacy Assistant   Name: ARGEL PABLO  MRN: [id number] DOB: [birth year]  Reason for Encounter: Patient Assistance  PCP : [name], MD  Allergies:   Allergies  Allergen Reactions  . Lisinopril Cough  . Erythromycin     REACTION: abd pain  . Trulicity [Dulaglutide] Nausea Only    Medications: Outpatient Encounter Medications as of 10/04/2020  Medication Sig  . aspirin 81 MG tablet Take 1 tablet by mouth at bedtime.   . empagliflozin (JARDIANCE) 10 MG TABS tablet Take 1 tablet (10 mg total) by mouth daily.  Marland Kitchen glimepiride (AMARYL) 4 MG tablet Take 1 tablet by mouth once daily with breakfast  . glucose blood (ONETOUCH ULTRA) test strip Use one strip twice daily to check blood sugars  . Lancets (ONETOUCH DELICA PLUS [id number]) MISC USE 1  TO CHECK GLUCOSE ONCE DAILY  . Lancets (ONETOUCH DELICA PLUS [id number]) MISC USE 1  TO CHECK GLUCOSE TWICE DAILY  . lisinopril (ZESTRIL) 10 MG tablet Take 1 tablet (10 mg total) by mouth daily.  . metFORMIN (GLUCOPHAGE) 500 MG tablet Take 2 tablets (1,000 mg total) by mouth 2 (two) times daily with a meal.  . nitroGLYCERIN (NITROSTAT) 0.4 MG SL tablet Take one tablet sublingual as needed for chest pain up to three doses as needed. (Patient taking differently: Place 0.4 mg under the tongue every 5 (five) minutes as needed for chest pain.)  . pioglitazone (ACTOS) 45 MG tablet Take 1 tablet (45 mg total) by mouth daily.  . rosuvastatin (CRESTOR) 20 MG tablet Take 1 tablet (20 mg total) by mouth daily.  . Semaglutide,0.25 or 0.5MG /DOS, (OZEMPIC, 0.25 OR 0.5 MG/DOSE,) 2 MG/1.5ML SOPN Start 0.25 mg [place] once weekly for 4 weeks and then increase to 0.5 mg [place] weekly.  . sildenafil (REVATIO) 20 MG tablet Take 2 to 5 tablets by mouth one hour prior to sexual activity as needed  . sitaGLIPtin (JANUVIA) 100 MG tablet Take 1 tablet (100 mg total) by mouth daily. (Patient taking differently: Take 100 mg by mouth at bedtime.)  .  vitamin B-12 (CYANOCOBALAMIN) 1000 MCG tablet Take 1,000 mcg by mouth at bedtime.    No facility-administered encounter medications on file as of 10/04/2020.    Current Diagnosis: Patient Active Problem List   Diagnosis Date Noted  . Erectile dysfunction 03/11/2020  . CKD (chronic kidney disease) stage 3, GFR 30-59 ml/min (HCC) 11/06/2019  . Laryngopharyngeal reflux (LPR) 06/12/2019  . Perennial allergic rhinitis 06/12/2019  . Chronic right shoulder pain 09/24/2016  . Cough 08/19/2016  . Hemoptysis 08/19/2016  . Normocytic anemia 06/29/2016  . Hypertension 12/25/2015  . Obesity (BMI 30-39.9) 02/02/2014  . Acute URI 10/10/2012  . Overweight 04/05/2012  . Venous insufficiency 04/02/2011  . HEADACHE 02/25/2008  . CHEST PAIN, ATYPICAL 02/25/2008  . HYPERCHOLESTEROLEMIA 02/24/2008  . Poorly controlled type 2 diabetes mellitus ([place]) 02/23/2008  . ANXIETY 02/23/2008  . GERD 02/23/2008  . PSORIASIS 02/23/2008  . DEGENERATIVE JOINT DISEASE 02/23/2008  . COLONIC POLYPS, HX OF 02/23/2008    Goals Addressed   None     Follow-Up:  Patient Assistance Coordination  I called and spoke with ([place]) at [facility] about patient applications. It was received. The proof of income was unclear and needed to be re-sent. I contacted the patient and let him know this information. He stated that he would bring it to its primary care doctor within the next week.  Maia Breslow, [place] Assistant [phone]

## 2020-10-10 ENCOUNTER — Telehealth: Payer: Self-pay | Admitting: Pharmacist

## 2020-10-10 NOTE — Telephone Encounter (Addendum)
Patient called to report that he was rejected through patient assistance with [employer] for Jardiance due to his income. Will determine his eligibility for [employer] patient assistance.   Patient also reported that he feels the sildenafil is no longer working for him. He is currently taking the maximum dose of 100 mg and it was working before but now is no longer effective. Patient requested an alternative if possible. Will route to PCP to make him aware.

## 2020-10-12 MED ORDER — TADALAFIL 20 MG PO TABS: ORAL_TABLET | ORAL | 11 refills | Status: DC

## 2020-10-12 NOTE — Telephone Encounter (Signed)
I sent in the Cialis to try- although it seems that most patients that don't respond to maximal dose of one phosphodiesterase type 5 inhibitor don't usually do better with another.

## 2020-10-15 ENCOUNTER — Telehealth: Payer: Self-pay | Admitting: Family Medicine

## 2020-10-15 NOTE — Telephone Encounter (Signed)
[place] called want to make Dr. Elease Hashimoto aware that the medication prescribe Tadalafil (Cialis)  Will interact with the patient other medication Isosorbide. The pharmacy is not aware is the patient is still taking the medication or not but wanted Dr. Elease Hashimoto to be aware.

## 2020-10-15 NOTE — Telephone Encounter (Signed)
Patient informed of the message below and stated he is not taking Isosorbide.  I spoke with the pharmacist Annice Pih and informed him of this as well.  Message sent to PCP.

## 2020-10-15 NOTE — Telephone Encounter (Signed)
I do not see isosorbide on his medication list.  Obviously, if he is taking isosorbide he cannot take Cialis or Viagra.  Would confirm- if he is only taking as needed sublingual nitroglycerin patient can be prescribed Cialis as long as he knows not to take these concomitantly

## 2020-10-21 ENCOUNTER — Other Ambulatory Visit: Payer: Self-pay

## 2020-10-21 NOTE — Telephone Encounter (Signed)
Refill for 1 year 

## 2020-10-22 ENCOUNTER — Other Ambulatory Visit: Payer: Self-pay

## 2020-10-22 MED ORDER — METFORMIN HCL 500 MG PO TABS: 1000.0000 mg | ORAL_TABLET | Freq: Two times a day (BID) | ORAL | 3 refills | Status: DC

## 2020-10-23 ENCOUNTER — Telehealth: Payer: Self-pay | Admitting: Pharmacist

## 2020-10-23 ENCOUNTER — Telehealth: Payer: Self-pay

## 2020-10-23 MED ORDER — TADALAFIL 20 MG PO TABS: ORAL_TABLET | ORAL | 3 refills | Status: DC

## 2020-10-23 MED ORDER — EMPAGLIFLOZIN 10 MG PO TABS: 10.0000 mg | ORAL_TABLET | Freq: Every day | ORAL | 3 refills | Status: DC

## 2020-10-23 MED ORDER — METFORMIN HCL 500 MG PO TABS: 1000.0000 mg | ORAL_TABLET | Freq: Two times a day (BID) | ORAL | 3 refills | Status: DC

## 2020-10-23 NOTE — Chronic Care Management (AMB) (Addendum)
Chronic Care Management Pharmacy Assistant   Name: Lucas Warren  MRN: [id number] DOB: [birth year]  Reason for Encounter: Medication Review  Patient Questions:  1.  Have you seen any other providers since your last visit? No   2.  Any changes in your medicines or health?    Discontinue Sitagliptin (JANUVIA) 100 MG tablet and replaced with Ozempic   PCP : [name], MD  Allergies:   Allergies  Allergen Reactions   Lisinopril Cough   Erythromycin     REACTION: abd pain   Trulicity [Dulaglutide] Nausea Only    Medications: Outpatient Encounter Medications as of 10/23/2020  Medication Sig   aspirin 81 MG tablet Take 1 tablet by mouth at bedtime.    empagliflozin (JARDIANCE) 10 MG TABS tablet Take 1 tablet (10 mg total) by mouth daily.   glimepiride (AMARYL) 4 MG tablet Take 1 tablet by mouth once daily with breakfast   glucose blood (ONETOUCH ULTRA) test strip Use one strip twice daily to check blood sugars   Lancets (ONETOUCH DELICA PLUS [id number]) MISC USE 1  TO CHECK GLUCOSE ONCE DAILY   Lancets (ONETOUCH DELICA PLUS [id number]) MISC USE 1  TO CHECK GLUCOSE TWICE DAILY   lisinopril (ZESTRIL) 10 MG tablet Take 1 tablet (10 mg total) by mouth daily.   metFORMIN (GLUCOPHAGE) 500 MG tablet Take 2 tablets (1,000 mg total) by mouth 2 (two) times daily with a meal.   nitroGLYCERIN (NITROSTAT) 0.4 MG SL tablet Take one tablet sublingual as needed for chest pain up to three doses as needed. (Patient taking differently: Place 0.4 mg under the tongue every 5 (five) minutes as needed for chest pain.)   pioglitazone (ACTOS) 45 MG tablet Take 1 tablet (45 mg total) by mouth daily.   rosuvastatin (CRESTOR) 20 MG tablet Take 1 tablet (20 mg total) by mouth daily.   Semaglutide,0.25 or 0.5MG /DOS, (OZEMPIC, 0.25 OR 0.5 MG/DOSE,) 2 MG/1.5ML SOPN Start 0.25 mg [place] once weekly for 4 weeks and then increase to 0.5 mg [place] weekly.   sildenafil (REVATIO) 20 MG tablet Take 2 to 5 tablets by  mouth one hour prior to sexual activity as needed   sitaGLIPtin (JANUVIA) 100 MG tablet Take 1 tablet (100 mg total) by mouth daily. (Patient taking differently: Take 100 mg by mouth at bedtime.)   tadalafil (CIALIS) 20 MG tablet Take one every other day as needed for erectile dysfunction.   vitamin B-12 (CYANOCOBALAMIN) 1000 MCG tablet Take 1,000 mcg by mouth at bedtime.    No facility-administered encounter medications on file as of 10/23/2020.    Current Diagnosis: Patient Active Problem List   Diagnosis Date Noted   Erectile dysfunction 03/11/2020   CKD (chronic kidney disease) stage 3, GFR 30-59 ml/min (HCC) 11/06/2019   Laryngopharyngeal reflux (LPR) 06/12/2019   Perennial allergic rhinitis 06/12/2019   Chronic right shoulder pain 09/24/2016   Cough 08/19/2016   Hemoptysis 08/19/2016   Normocytic anemia 06/29/2016   Hypertension 12/25/2015   Obesity (BMI 30-39.9) 02/02/2014   Acute URI 10/10/2012   Overweight 04/05/2012   Venous insufficiency 04/02/2011   HEADACHE 02/25/2008   CHEST PAIN, ATYPICAL 02/25/2008   HYPERCHOLESTEROLEMIA 02/24/2008   Poorly controlled type 2 diabetes mellitus ([place]) 02/23/2008   ANXIETY 02/23/2008   GERD 02/23/2008   PSORIASIS 02/23/2008   DEGENERATIVE JOINT DISEASE 02/23/2008   COLONIC POLYPS, HX OF 02/23/2008    Goals Addressed   None     Reviewed chart for medication changes ahead of  medication coordination call.  BP Readings from Last 3 Encounters:  09/10/20 122/68  08/08/20 124/62  06/11/20 122/80    Lab Results  Component Value Date   HGBA1C 8.3 (A) 09/10/2020     Patient obtains medications through Adherence Packaging  30 Days   Last adherence delivery included:   Lisinopril (ZESTRIL) 10 mg: one tablet at dinner Metformin (GLUCOPHAGE) 500 mg:  two (2) tablets at breakfast and dinner Pioglitazone (ACTOS) 45 mg: one tablet at breakfast Rosuvastatin (CRESTOR) 20 mg: one tablet at bedtime Glimepiride (AMARYL) 4 mg: one  tablet a breakfast Glucose blood (ONETOUCH ULTRA) test strip  Patient declined (meds) last month due to PRN use/additional supply on hand. Jardiance 10 MG TABS tablet  Nitroglycerin (NITROSTAT) 0.4 MG SL tablet Sildenafil (REVATIO) 20 MG tablet Sitagliptin (JANUVIA) 100 MG tablet  Vitamin B-12 (CYANOCOBALAMIN) 1000 MCG tablet Glucose blood (ONETOUCH ULTRA) test strip  Lancets (ONETOUCH DELICA PLUS [id number]) MISC: use 1 to check glucose twice daily       Patient is due for next adherence delivery on: 10/31/2020. Called patient and reviewed medications and coordinated delivery. This delivery to include: Lisinopril (ZESTRIL) 10 mg: one tablet at dinner Pioglitazone (ACTOS) 45 mg: one tablet at breakfast Rosuvastatin (CRESTOR) 20 mg: one tablet at bedtime Glimepiride (AMARYL) 4 mg: one tablet at breakfast Jardiance 10 MG TABS tablet: one tablet at breakfast  Coordinated acute fill for Tadalafil 20 mg  to be delivered 10/24/2020.  Patient declined the following medications due to PRN use/additional supply on hand. Metformin (GLUCOPHAGE) 500 mg:  two (2) tablets at breakfast and dinner (Supply on hand) Nitroglycerin (NITROSTAT) 0.4 MG SL tablet Sildenafil (REVATIO) 20 MG tablet Vitamin B-12 (CYANOCOBALAMIN) 1000 MCG tablet Glucose blood (ONETOUCH ULTRA) test strip  Lancets (ONETOUCH DELICA PLUS [id number]) MISC: use 1 to check glucose twice daily        He currently does not need refills.  Confirmed delivery date of 10-31-2020, advised patient that pharmacy will contact them the morning of delivery. Follow-Up:  Coordination of [facility] and Pharmacist Review   Neita Goodnight) Mare Ferrari, [place] Assistant [phone]'

## 2020-10-23 NOTE — Telephone Encounter (Signed)
-----   Message from Viona Gilmore, [facility] sent at 10/22/2020 10:50 AM EST ----- Regarding: Refills/wrong pharmacy Hi,  Lucas Warren called me to let me know he switched pharmacies to [facility] and wants all of his medications sent there from now on. I updated Lucas Warren preferred pharmacy to reflect this. Can you please resend the metformin and Cialis to [facility] as these were accidentally sent to [facility]? He is also requesting if you can send in a 90 day supply of the Cialis or at least more than the current 20 day supply he received.   Lastly, please send in a refill of the Jardiance 10 mg daily to [facility] pharmacy as well. He was receiving from the manufacturer but has been denied for this year.  Thank you, Maddie

## 2020-10-24 ENCOUNTER — Other Ambulatory Visit: Payer: Self-pay | Admitting: Family Medicine

## 2020-10-24 MED ORDER — EMPAGLIFLOZIN 10 MG PO TABS: 10.0000 mg | ORAL_TABLET | Freq: Every day | ORAL | 3 refills | Status: DC

## 2020-10-24 NOTE — Addendum Note (Signed)
Addended by: Westley Hummer B on: 10/24/2020 04:08 PM   Modules accepted: Orders

## 2020-10-29 ENCOUNTER — Telehealth: Payer: Self-pay | Admitting: Pharmacist

## 2020-10-29 NOTE — Chronic Care Management (AMB) (Signed)
    Chronic Care Management Pharmacy Assistant   Name: Lucas Warren  MRN: [id number] DOB: August 10, 1946  Reason for Encounter:  Medication Refill  Patient Questions:  1.  Have you seen any other providers since your last visit? No  2.  Any changes in your medicines or health? No   Received call from patient regarding medication management via [facility] pharmacy. . Lancets (ONETOUCH DELICA PLUS [id number]) MISC: use 1 to check glucose twice daily        Patient requested an acute fill for the following medication to be delivered: 10/31/2020 Pharmacy needs refills? No  Confirmed delivery date of 10/31/2020, advised patient that pharmacy will contact them the morning of delivery.   Follow-Up:  Coordination of [facility] Pharmacy Services  [place] ([place]) Mare Ferrari, [place] Assistant [phone]

## 2020-11-06 ENCOUNTER — Other Ambulatory Visit: Payer: Self-pay

## 2020-11-06 NOTE — Progress Notes (Signed)
Medication Samples have been provided to the patient.  Drug name: Ozempic       Strength: 0.25mg         Qty: 1  LOT: [id number]  Exp.Date: 01/2023  Dosing instructions: inject 0.25mg  weekly.   The patient has been instructed regarding the correct time, dose, and frequency of taking this medication, including desired effects and most common side effects.

## 2020-11-08 ENCOUNTER — Telehealth: Payer: Self-pay | Admitting: *Deleted

## 2020-11-08 NOTE — Telephone Encounter (Signed)
Spoke with the pt and informed him 5 boxes of Ozempic pens were received from [facility] and ready for pick up.  Invoice sheet sent to be scanned.

## 2020-11-11 ENCOUNTER — Telehealth: Payer: Self-pay | Admitting: Pharmacist

## 2020-11-11 NOTE — Telephone Encounter (Signed)
Patient called to discuss administration of Ozempic as he has not injected his first dose. Discussed administration instructions and patient completed his first dose over the phone.  Patient is aware to call back if he has any more questions or side effects with the Ozempic. Patient will be picking up his supply from the manufacturer later this week.

## 2020-11-19 ENCOUNTER — Telehealth: Payer: Self-pay | Admitting: *Deleted

## 2020-11-19 MED ORDER — EMPAGLIFLOZIN 10 MG PO TABS: 10.0000 mg | ORAL_TABLET | Freq: Every day | ORAL | 0 refills | Status: DC

## 2020-11-19 NOTE — Telephone Encounter (Signed)
-----   Message from Viona Gilmore, [facility] sent at 11/18/2020 12:21 PM EDT ----- Regarding: Jardiance samples Hi,  Can you please put in 1 box (7 tabs) of Jardiance 10 mg samples for Lucas Warren? The lot was [id number] and exp: Sept 2023.  Thank you, Maddie

## 2020-11-20 ENCOUNTER — Ambulatory Visit (INDEPENDENT_AMBULATORY_CARE_PROVIDER_SITE_OTHER): Payer: Medicare Other | Admitting: Pharmacist

## 2020-11-20 DIAGNOSIS — E1165 Type 2 diabetes mellitus with hyperglycemia: Secondary | ICD-10-CM | POA: Diagnosis not present

## 2020-11-20 DIAGNOSIS — I10 Essential (primary) hypertension: Secondary | ICD-10-CM

## 2020-11-20 NOTE — Progress Notes (Addendum)
Chronic Care Management Pharmacy Note  11/28/2020 Name:  Lucas Warren MRN:  [id number] DOB:  [birth year]  Subjective: Lucas Warren is an 75 y.o. year old male who is a primary patient of Burchette, Alinda Sierras, MD.  The CCM team was consulted for assistance with disease management and care coordination needs.    Engaged with patient by telephone for follow up visit in response to provider referral for pharmacy case management and/or care coordination services.   Consent to Services:  The patient was given information about Chronic Care Management services, agreed to services, and gave verbal consent prior to initiation of services.  Please see initial visit note for detailed documentation.   Patient Care Team: [name], MD as PCP - General (Family Medicine) Jettie Booze, MD as PCP - Cardiology (Cardiology) Viona Gilmore, [facility] as Pharmacist (Pharmacist)  Recent office visits: 09/11/19 CPP visit: Filled out PAP for Jardiance and Ozempic. Patient ok with trying Ozempic once he finishes his supply of Januvia.  09/11/19 [name], MD: Patient presented for DM follow up. A1c increased to 8.3%.   06/11/20 [name], MD: Patient presented for DM follow up. A1c of 7.7%. No medication changes made.  05/03/2020 - [name], MD - Patient presented for chest pressure and shortness of breath for the past 3-4 weeks. Restarted aspirin 81 mg daily and started PRN nitroglycerin SL.  Recent consult visits: 08/08/20 05/07/20 [name], MD (cardiology): Patient presented for follow up.  No changes made.  05/07/20 [name], MD (cardiology): Patient presented with chest pressure and initial work up. Switched pravastatin 40 mg to rosuvastatin 20 mg and started isosorbide mononitrate 30 mg daily.  Hospital visits: None in previous 6 months  Objective:  Lab Results  Component Value Date   CREATININE 1.62 (H) 06/11/2020   BUN 28 (H) 06/11/2020   GFR  42.45 (L) 08/15/2019   GFRNONAA 42 (L) 06/11/2020   GFRAA 48 (L) 06/11/2020   NA 140 06/11/2020   K 4.3 06/11/2020   CALCIUM 9.1 06/11/2020   CO2 23 06/11/2020   GLUCOSE 95 06/11/2020    Lab Results  Component Value Date/Time   HGBA1C 8.3 (A) 09/10/2020 08:05 AM   HGBA1C 7.7 (A) 06/11/2020 08:17 AM   HGBA1C 8.3 (H) 12/25/2015 08:39 AM   HGBA1C 8.4 10/14/2015 12:00 AM   HGBA1C 7.3 (H) 05/22/2015 09:53 AM   GFR 42.45 (L) 08/15/2019 07:32 AM   GFR 45.36 (L) 08/07/2019 08:48 AM   MICROALBUR 2.5 06/11/2020 08:23 AM   MICROALBUR 4.3 (H) 01/10/2018 08:54 AM    Last diabetic Eye exam:  Lab Results  Component Value Date/Time   HMDIABEYEEXA No Retinopathy 04/02/2020 12:00 AM    Last diabetic Foot exam:  Lab Results  Component Value Date/Time   HMDIABFOOTEX normal 03/25/2016 12:00 AM     Lab Results  Component Value Date   CHOL 127 06/11/2020   HDL 50 06/11/2020   LDLCALC 57 06/11/2020   TRIG 117 06/11/2020   CHOLHDL 2.5 06/11/2020    Hepatic Function Latest Ref Rng & Units 06/11/2020 08/07/2019 11/11/2018  Total Protein 6.1 - 8.1 g/dL 6.7 7.0 7.2  Albumin 3.5 - 5.2 g/dL - 4.5 4.6  AST 10 - 35 U/L 12 12 11   ALT 9 - 46 U/L 8(L) 7 9  Alk Phosphatase 39 - 117 U/L - 51 58  Total Bilirubin 0.2 - 1.2 mg/dL 0.4 0.5 0.5  Bilirubin, Direct 0.0 - 0.3 mg/dL - 0.1 -  Lab Results  Component Value Date/Time   TSH 0.86 07/24/2015 12:35 PM   TSH 0.71 12/05/2012 10:44 AM    CBC Latest Ref Rng & Units 05/15/2020 11/11/2018 08/19/2016  WBC 3.4 - 10.8 x10E3/uL 8.3 9.1 9.2  Hemoglobin 13.0 - 17.7 g/dL 12.9(L) 13.9 12.6(L)  Hematocrit 37.5 - 51.0 % 38.6 42.0 37.4(L)  Platelets 150 - 450 x10E3/uL 227 222.0 198.0    No results found for: VD25OH  Clinical ASCVD: No  The ASCVD Risk score Mikey Bussing DC Jr., et al., 2013) failed to calculate for the following reasons:   The valid total cholesterol range is 130 to 320 mg/dL    Depression screen [facility] 2/9 04/26/2020 11/06/2019 01/18/2018  Decreased  Interest 0 0 0  Down, Depressed, Hopeless 0 0 0  PHQ - 2 Score 0 0 0  Altered sleeping 0 0 -  Tired, decreased energy 0 0 -  Change in appetite 0 0 -  Feeling bad or failure about yourself  0 0 -  Trouble concentrating 0 0 -  Moving slowly or fidgety/restless 0 0 -  Suicidal thoughts 0 0 -  PHQ-9 Score 0 0 -  Difficult doing work/chores Not difficult at all Not difficult at all -      Social History   Tobacco Use  Smoking Status Former Smoker  . Packs/day: 2.00  . Years: 20.00  . Pack years: 40.00  . Types: Cigarettes  . Quit date: 08/31/1981  . Years since quitting: 39.2  Smokeless Tobacco Never Used   BP Readings from Last 3 Encounters:  09/10/20 122/68  08/08/20 124/62  06/11/20 122/80   Pulse Readings from Last 3 Encounters:  09/10/20 65  08/08/20 74  06/11/20 (!) 56   Wt Readings from Last 3 Encounters:  09/10/20 223 lb (101.2 kg)  08/08/20 227 lb 12.8 oz (103.3 kg)  06/11/20 229 lb (103.9 kg)   BMI Readings from Last 3 Encounters:  09/10/20 30.24 kg/m  08/08/20 30.90 kg/m  06/11/20 31.06 kg/m    Assessment/Interventions: Review of patient past medical history, allergies, medications, health status, including review of consultants reports, laboratory and other test data, was performed as part of comprehensive evaluation and provision of chronic care management services.   SDOH:  (Social Determinants of Health) assessments and interventions performed: No   CCM Care Plan  Allergies  Allergen Reactions  . Lisinopril Cough  . Erythromycin     REACTION: abd pain  . Trulicity [Dulaglutide] Nausea Only    Medications Reviewed Today    Reviewed by Viona Gilmore, [facility] (Pharmacist) on 11/28/20 at 0857  Med List Status: <None>  Medication Order Taking? Sig Documenting Provider Last Dose Status Informant  aspirin 81 MG tablet [id number] No Take 1 tablet by mouth at bedtime.  [provider] Taking Active Self  empagliflozin (JARDIANCE) 10 MG TABS  tablet [id number]  Take 1 tablet (10 mg total) by mouth daily. [name], MD  Active   glimepiride (AMARYL) 4 MG tablet [id number] No Take 1 tablet by mouth once daily with breakfast Burchette, Alinda Sierras, MD Taking Active   glucose blood (ONETOUCH ULTRA) test strip [id number] No Use one strip twice daily to check blood sugars Burchette, Alinda Sierras, MD Taking Active   Lancets (ONETOUCH DELICA PLUS [id number]) MISC [id number] No USE 1  TO CHECK GLUCOSE ONCE DAILY Burchette, Alinda Sierras, MD Taking Active Self  Lancets [facility] DELICA PLUS [id number]) MISC [id number] No USE 1  TO CHECK GLUCOSE TWICE DAILY Burchette, Bruce  W, MD Taking Active   lisinopril (ZESTRIL) 10 MG tablet [id number] No Take 1 tablet (10 mg total) by mouth daily. [name], MD Taking Active   metFORMIN (GLUCOPHAGE) 500 MG tablet [id number]  Take 2 tablets (1,000 mg total) by mouth 2 (two) times daily with a meal. Burchette, Alinda Sierras, MD  Active   nitroGLYCERIN (NITROSTAT) 0.4 MG SL tablet [id number] No Take one tablet sublingual as needed for chest pain up to three doses as needed.  Patient taking differently: Place 0.4 mg under the tongue every 5 (five) minutes as needed for chest pain.   [name], MD Taking Active   pioglitazone (ACTOS) 45 MG tablet [id number] No Take 1 tablet (45 mg total) by mouth daily. [name], MD Taking Active   rosuvastatin (CRESTOR) 20 MG tablet [id number] No Take 1 tablet (20 mg total) by mouth daily. Jettie Booze, MD Taking Active   Semaglutide,0.25 or 0.5MG/DOS, (OZEMPIC, 0.25 OR 0.5 MG/DOSE,) 2 MG/1.5ML SOPN [id number]  Start 0.25 mg [place] once weekly for 4 weeks and then increase to 0.5 mg [place] weekly. [name], MD  Active         Discontinued 11/28/20 [phone] (Change in therapy)        Patient taking differently:      Discontinued 11/28/20 0857 (Change in therapy)   tadalafil (CIALIS) 20 MG tablet [id number]  Take one every other day as needed for erectile dysfunction.  [name], MD  Active   vitamin B-12 (CYANOCOBALAMIN) 1000 MCG tablet [id number] No Take 1,000 mcg by mouth at bedtime.  [provider] Taking Active Self          Patient Active Problem List   Diagnosis Date Noted  . Erectile dysfunction 03/11/2020  . CKD (chronic kidney disease) stage 3, GFR 30-59 ml/min (HCC) 11/06/2019  . Laryngopharyngeal reflux (LPR) 06/12/2019  . Perennial allergic rhinitis 06/12/2019  . Chronic right shoulder pain 09/24/2016  . Cough 08/19/2016  . Hemoptysis 08/19/2016  . Normocytic anemia 06/29/2016  . Hypertension 12/25/2015  . Obesity (BMI 30-39.9) 02/02/2014  . Acute URI 10/10/2012  . Overweight 04/05/2012  . Venous insufficiency 04/02/2011  . HEADACHE 02/25/2008  . CHEST PAIN, ATYPICAL 02/25/2008  . HYPERCHOLESTEROLEMIA 02/24/2008  . Poorly controlled type 2 diabetes mellitus ([place]) 02/23/2008  . ANXIETY 02/23/2008  . GERD 02/23/2008  . PSORIASIS 02/23/2008  . DEGENERATIVE JOINT DISEASE 02/23/2008  . COLONIC POLYPS, HX OF 02/23/2008    Immunization History  Administered Date(s) Administered  . Fluad Quad(high Dose 65+) 05/09/2019, 06/11/2020  . Influenza Split 10/05/2011  . Influenza Whole 07/16/2009  . Influenza, High Dose Seasonal PF 05/22/2015, 06/29/2016, 06/15/2017, 05/20/2018  . Influenza,inj,Quad PF,6+ Mos 07/07/2013, 05/04/2014  . PFIZER(Purple Top)SARS-COV-2 Vaccination 10/22/2019, 11/15/2019, 07/09/2020  . Pneumococcal Conjugate-13 08/21/2014  . Pneumococcal Polysaccharide-23 12/05/2012  . Pneumococcal-Unspecified 10/11/2007  . Td 03/09/2006  . Tdap 03/25/2016  . Zoster 03/31/2016    Conditions to be addressed/monitored:  Hypertension, Hyperlipidemia, Diabetes, Chronic Kidney Disease and ED  Care Plan : [place]  Updates made by Viona Gilmore, [place] since 11/28/2020 12:00 AM    Problem: Problem: Hypertension, Hyperlipidemia, Diabetes, Chronic Kidney Disease and ED     Long-Range Goal:  Patient-Specific Goal   Start Date: 11/20/2020  Expected End Date: 11/20/2021  This Visit's Progress: On track  Priority: High  Note:   Current Barriers:  . Unable to independently monitor therapeutic efficacy . Unable to achieve control of diabetes  Pharmacist Clinical Goal(s):  Marland Kitchen Patient will achieve adherence to monitoring guidelines and medication adherence to achieve therapeutic efficacy . achieve control of diabetes as evidenced by A1c  through collaboration with PharmD and provider.   Interventions: . 1:1 collaboration with [name], MD regarding development and update of comprehensive plan of care as evidenced by provider attestation and co-signature . Inter-disciplinary care team collaboration (see longitudinal plan of care) . Comprehensive medication review performed; medication list updated in electronic medical record  Hypertension (BP goal <140/90) -Controlled -Current treatment: . Lisinopril 57m, 1 tablet once daily -Medications previously tried: isosorbide  -Current home readings: not checking regularly -Current dietary habits: did not discuss -Current exercise habits: did not discuss -Denies hypotensive/hypertensive symptoms -Educated on Exercise goal of 150 minutes per week; Importance of home blood pressure monitoring; -Counseled to monitor BP at home weekly, document, and provide log at future appointments -Counseled on diet and exercise extensively Recommended to continue current medication  Hyperlipidemia: (LDL goal < 70) -Controlled -Current treatment: . Rosuvastatin 260m 1 tablet once daily -Medications previously tried:  rosuvastatin (elevated BGs), simvastatin (myalgias)  -Current dietary patterns: did not discuss -Current exercise habits: did not discuss -Educated on Cholesterol goals;  Importance of limiting foods high in cholesterol; Exercise goal of 150 minutes per week; -Recommended to continue current medication  Diabetes (A1c  goal <7%) -Uncontrolled -Current medications:  Glimepiride 38m1038m1 tablet once daily with breakfast  Empagliflozin (Jardiance) 96m52m tablet once daily  Metformin 500mg17mtablets twice daily  Pioglitazone (Actos) 45mg,52mablet once daily  Ozempic, inject 0.25 mg once weekly -Medications previously tried: Onglyza (cost), Januvia (switch to GLP1), Trulicity (nausea)  -Current home glucose readings . fasting glucose: 120, 79-80 . post prandial glucose: higher in the afternoon -Denies hypoglycemic/hyperglycemic symptoms -Current meal patterns:  . breakfast: n/a  . lunch: n/a  . dinner: n/a . snacks: used to eat little debbies and honeybuns and cut them out; eating peanut butter crackers, cream cheese and chives, and desserts only every once in a while . drinks: n/a -Current exercise: did not discuss -Educated on Exercise goal of 150 minutes per week; Benefits of routine self-monitoring of blood sugar; Carbohydrate counting and/or plate method -Counseled to check feet daily and get yearly eye exams -Counseled on diet and exercise extensively Recommended to continue current medication Recommended increasing Ozempic to 0.5 mg after 1 month  CKD (Goal: improve kidney function) -Controlled -Current treatment  . Jardiance 10 mg 1 tablet daily -Medications previously tried: none  -Recommended to continue current medication  Primary prevention of ASCVD (Goal: prevent heart attacks and strokes) -Controlled -Current treatment  . Aspirin 81 mg 1 tablet daily -Medications previously tried: none  -Counseled on monitoring for bleeding and bruising  ED (Goal: minimize symptoms) -Controlled -Current treatment  . Cialis 5 mg 1 tablet as needed -Medications previously tried: sildenafil (no longer effective)  -Recommended to continue current medication   Health Maintenance -Vaccine gaps: shingrix -Current therapy:  . VitaMarland Kitchenin B12 1000 mcg 1 tablet daily -Educated on Cost vs  benefit of each product must be carefully weighed by individual consumer -Patient is satisfied with current therapy and denies issues -Recommended to continue current medication  Patient Goals/Self-Care Activities . Patient will:  - take medications as prescribed check glucose daily, document, and provide at future appointments check blood pressure weekly, document, and provide at future appointments target a minimum of 150 minutes of moderate intensity exercise weekly engage in dietary modifications by eating more non-starchy  vegetables than carbs  Follow Up Plan: Telephone follow up appointment with care management team member scheduled for: 3 months       Medication Assistance: Ozempic obtained through [employer] medication assistance program.  Enrollment ends 08/30/21    Patient's preferred pharmacy is:  [facility] - [place], [place] - [address] [place] [address] Phone: [phone] Fax: [phone]  Uses pill box? No - adherence packaging Pt endorses 100% compliance  We discussed: Benefits of medication synchronization, packaging and delivery as well as enhanced pharmacist oversight with [facility]. Patient decided to: Utilize [facility] for medication synchronization, packaging and delivery  Care Plan and Follow Up Patient Decision:  Patient agrees to Care Plan and Follow-up.  Plan: Telephone follow up appointment with care management team member scheduled for:  3 months  [name], PharmD [place] Pharmacist [place] [facility] [place] [phone]

## 2020-11-28 NOTE — Patient Instructions (Signed)
Hi Maccoy,  As always, it was great getting to speak with you again! Keep up the good work with making some of those diet changes we discussed in order to lower your blood sugars.  Please reach out to me if you have any questions or need anything before our follow up!  Best, Maddie  [name], PharmD, [facility]  Visit Information  Goals Addressed   None    Patient Care Plan: [facility]CM Pharmacy Care Plan    Problem Identified: Problem: Hypertension, Hyperlipidemia, Diabetes, Chronic Kidney Disease and ED     Long-Range Goal: Patient-Specific Goal   Start Date: 11/20/2020  Expected End Date: 11/20/2021  This Visit's Progress: On track  Priority: High  Note:   Current Barriers:  . Unable to independently monitor therapeutic efficacy . Unable to achieve control of diabetes   Pharmacist Clinical Goal(s):  Marland Kitchen Patient will achieve adherence to monitoring guidelines and medication adherence to achieve therapeutic efficacy . achieve control of diabetes as evidenced by A1c  through collaboration with PharmD and provider.   Interventions: . 1:1 collaboration with [name], MD regarding development and update of comprehensive plan of care as evidenced by provider attestation and co-signature . Inter-disciplinary care team collaboration (see longitudinal plan of care) . Comprehensive medication review performed; medication list updated in electronic medical record  Hypertension (BP goal <140/90) -Controlled -Current treatment: . Lisinopril 10mg , 1 tablet once daily -Medications previously tried: isosorbide  -Current home readings: not checking regularly -Current dietary habits: did not discuss -Current exercise habits: did not discuss -Denies hypotensive/hypertensive symptoms -Educated on Exercise goal of 150 minutes per week; Importance of home blood pressure monitoring; -Counseled to monitor BP at home  weekly, document, and provide log at future appointments -Counseled on diet and exercise extensively Recommended to continue current medication  Hyperlipidemia: (LDL goal < 70) -Controlled -Current treatment: . Rosuvastatin 20mg , 1 tablet once daily -Medications previously tried:  rosuvastatin (elevated BGs), simvastatin (myalgias)  -Current dietary patterns: did not discuss -Current exercise habits: did not discuss -Educated on Cholesterol goals;  Importance of limiting foods high in cholesterol; Exercise goal of 150 minutes per week; -Recommended to continue current medication  Diabetes (A1c goal <7%) -Uncontrolled -Current medications:  Glimepiride 4mg , 1 tablet once daily with breakfast  Empagliflozin (Jardiance) 10mg , 1 tablet once daily  Metformin 500mg , 2 tablets twice daily  Pioglitazone (Actos) 45mg , 1 tablet once daily  Ozempic, inject 0.25 mg once weekly -Medications previously tried: Onglyza (cost), Januvia (switch to GLP1), Trulicity (nausea)  -Current home glucose readings . fasting glucose: 120, 79-80 . post prandial glucose: higher in the afternoon -Denies hypoglycemic/hyperglycemic symptoms -Current meal patterns:  . breakfast: n/a  . lunch: n/a  . dinner: n/a . snacks: used to eat little debbies and honeybuns and cut them out; eating peanut butter crackers, cream cheese and chives, and desserts only every once in a while . drinks: n/a -Current exercise: did not discuss -Educated on Exercise goal of 150 minutes per week; Benefits of routine self-monitoring of blood sugar; Carbohydrate counting and/or plate method -Counseled to check feet daily and get yearly eye exams -Counseled on diet and exercise extensively Recommended to continue current medication Recommended increasing Ozempic to 0.5 mg after 1 month  CKD (Goal: improve kidney function) -Controlled -Current treatment  . Jardiance 10 mg 1 tablet daily -Medications previously tried: none   -Recommended to continue current medication  Primary prevention of ASCVD (Goal: prevent heart attacks and strokes) -  Controlled -Current treatment  . Aspirin 81 mg 1 tablet daily -Medications previously tried: none  -Counseled on monitoring for bleeding and bruising  ED (Goal: minimize symptoms) -Controlled -Current treatment  . Cialis 5 mg 1 tablet as needed -Medications previously tried: sildenafil (no longer effective)  -Recommended to continue current medication   Health Maintenance -Vaccine gaps: shingrix -Current therapy:  Marland Kitchen Vitamin B12 1000 mcg 1 tablet daily -Educated on Cost vs benefit of each product must be carefully weighed by individual consumer -Patient is satisfied with current therapy and denies issues -Recommended to continue current medication  Patient Goals/Self-Care Activities . Patient will:  - take medications as prescribed check glucose daily, document, and provide at future appointments check blood pressure weekly, document, and provide at future appointments target a minimum of 150 minutes of moderate intensity exercise weekly engage in dietary modifications by eating more non-starchy vegetables than carbs  Follow Up Plan: Telephone follow up appointment with care management team member scheduled for: 3 months       Patient verbalizes understanding of instructions provided today and agrees to view in [place].  Telephone follow up appointment with pharmacy team member scheduled for: 3 months  Viona Gilmore, [facility]  Diabetes Mellitus and Nutrition, Adult When you have diabetes, or diabetes mellitus, it is very important to have healthy eating habits because your blood sugar (glucose) levels are greatly affected by what you eat and drink. Eating healthy foods in the right amounts, at about the same times every day, can help you:  Control your blood glucose.  Lower your risk of heart disease.  Improve your blood pressure.  Reach or maintain a  healthy weight. What can affect my meal plan? Every person with diabetes is different, and each person has different needs for a meal plan. Your health care provider may recommend that you work with a dietitian to make a meal plan that is best for you. Your meal plan may vary depending on factors such as:  The calories you need.  The medicines you take.  Your weight.  Your blood glucose, blood pressure, and cholesterol levels.  Your activity level.  Other health conditions you have, such as heart or kidney disease. How do carbohydrates affect me? Carbohydrates, also called carbs, affect your blood glucose level more than any other type of food. Eating carbs naturally raises the amount of glucose in your blood. Carb counting is a method for keeping track of how many carbs you eat. Counting carbs is important to keep your blood glucose at a healthy level, especially if you use insulin or take certain oral diabetes medicines. It is important to know how many carbs you can safely have in each meal. This is different for every person. Your dietitian can help you calculate how many carbs you should have at each meal and for each snack. How does alcohol affect me? Alcohol can cause a sudden decrease in blood glucose (hypoglycemia), especially if you use insulin or take certain oral diabetes medicines. Hypoglycemia can be a life-threatening condition. Symptoms of hypoglycemia, such as sleepiness, dizziness, and confusion, are similar to symptoms of having too much alcohol.  Do not drink alcohol if: ? Your health care provider tells you not to drink. ? You are pregnant, may be pregnant, or are planning to become pregnant.  If you drink alcohol: ? Do not drink on an empty stomach. ? Limit how much you use to:  0-1 drink a day for women.  0-2 drinks a day  for men. ? Be aware of how much alcohol is in your drink. In the [place] one drink equals one 12 oz bottle of beer (355 mL), one 5 oz glass of  wine (148 mL), or one 1 oz glass of hard liquor (44 mL). ? Keep yourself hydrated with water, diet soda, or unsweetened iced tea.  Keep in mind that regular soda, juice, and other mixers may contain a lot of sugar and must be counted as carbs. What are tips for following this plan? Reading food labels  Start by checking the serving size on the "Nutrition Facts" label of packaged foods and drinks. The amount of calories, carbs, fats, and other nutrients listed on the label is based on one serving of the item. Many items contain more than one serving per package.  Check the total grams (g) of carbs in one serving. You can calculate the number of servings of carbs in one serving by dividing the total carbs by 15. For example, if a food has 30 g of total carbs per serving, it would be equal to 2 servings of carbs.  Check the number of grams (g) of saturated fats and trans fats in one serving. Choose foods that have a low amount or none of these fats.  Check the number of milligrams (mg) of salt (sodium) in one serving. Most people should limit total sodium intake to less than 2,300 mg per day.  Always check the nutrition information of foods labeled as "low-fat" or "nonfat." These foods may be higher in added sugar or refined carbs and should be avoided.  Talk to your dietitian to identify your daily goals for nutrients listed on the label. Shopping  Avoid buying canned, pre-made, or processed foods. These foods tend to be high in fat, sodium, and added sugar.  Shop around the outside edge of the grocery store. This is where you will most often find fresh fruits and vegetables, bulk grains, fresh meats, and fresh dairy. Cooking  Use low-heat cooking methods, such as baking, instead of high-heat cooking methods like deep frying.  Cook using healthy oils, such as olive, canola, or sunflower oil.  Avoid cooking with butter, cream, or high-fat meats. Meal planning  Eat meals and snacks  regularly, preferably at the same times every day. Avoid going long periods of time without eating.  Eat foods that are high in fiber, such as fresh fruits, vegetables, beans, and whole grains. Talk with your dietitian about how many servings of carbs you can eat at each meal.  Eat 4-6 oz (112-168 g) of lean protein each day, such as lean meat, chicken, fish, eggs, or tofu. One ounce (oz) of lean protein is equal to: ? 1 oz (28 g) of meat, chicken, or fish. ? 1 egg. ?  cup (62 g) of tofu.  Eat some foods each day that contain healthy fats, such as avocado, nuts, seeds, and fish.   What foods should I eat? Fruits Berries. Apples. Oranges. Peaches. Apricots. Plums. Grapes. Mango. Papaya. Pomegranate. Kiwi. Cherries. Vegetables Lettuce. Spinach. Leafy greens, including kale, chard, collard greens, and mustard greens. Beets. Cauliflower. Cabbage. Broccoli. Carrots. Green beans. Tomatoes. Peppers. Onions. Cucumbers. Brussels sprouts. Grains Whole grains, such as whole-wheat or whole-grain bread, crackers, tortillas, cereal, and pasta. Unsweetened oatmeal. Quinoa. Brown or wild rice. Meats and other proteins Seafood. Poultry without skin. Lean cuts of poultry and beef. Tofu. Nuts. Seeds. Dairy Low-fat or fat-free dairy products such as milk, yogurt, and cheese. The items listed above  may not be a complete list of foods and beverages you can eat. Contact a dietitian for more information. What foods should I avoid? Fruits Fruits canned with syrup. Vegetables Canned vegetables. Frozen vegetables with butter or cream sauce. Grains Refined white flour and flour products such as bread, pasta, snack foods, and cereals. Avoid all processed foods. Meats and other proteins Fatty cuts of meat. Poultry with skin. Breaded or fried meats. Processed meat. Avoid saturated fats. Dairy Full-fat yogurt, cheese, or milk. Beverages Sweetened drinks, such as soda or iced tea. The items listed above may not  be a complete list of foods and beverages you should avoid. Contact a dietitian for more information. Questions to ask a health care provider  Do I need to meet with a diabetes educator?  Do I need to meet with a dietitian?  What number can I call if I have questions?  When are the best times to check my blood glucose? Where to find more information:  American Diabetes Association: diabetes.org  Academy of Nutrition and Dietetics: www.eatright.[employer] of Diabetes and Digestive and Kidney Diseases: [employer]  Association of Diabetes Care and Education Specialists: www.diabeteseducator.org Summary  It is important to have healthy eating habits because your blood sugar (glucose) levels are greatly affected by what you eat and drink.  A healthy meal plan will help you control your blood glucose and maintain a healthy lifestyle.  Your health care provider may recommend that you work with a dietitian to make a meal plan that is best for you.  Keep in mind that carbohydrates (carbs) and alcohol have immediate effects on your blood glucose levels. It is important to count carbs and to use alcohol carefully. This information is not intended to replace advice given to you by your health care provider. Make sure you discuss any questions you have with your health care provider. Document Revised: 07/25/2019 Document Reviewed: 07/25/2019 Elsevier Patient Education  2021 [employer].

## 2020-11-28 NOTE — Patient Instructions (Signed)
Hi Lucas Warren,  As always, it was great getting to speak with you again! Keep up the good work with making some of those diet changes we discussed in order to lower your blood sugars.  Please reach out to me if you have any questions or need anything before our follow up!  Best, Maddie  [name], PharmD, [facility]  Visit Information  Goals Addressed   None    Patient Care Plan: CCM Pharmacy Care Plan    Problem Identified: Problem: Hypertension, Hyperlipidemia, Diabetes, Chronic Kidney Disease and ED     Long-Range Goal: Patient-Specific Goal   Start Date: 11/20/2020  Expected End Date: 11/20/2021  This Visit's Progress: On track  Priority: High  Note:   Current Barriers:  . Unable to independently monitor therapeutic efficacy . Unable to achieve control of diabetes   Pharmacist Clinical Goal(s):  Marland Kitchen Patient will achieve adherence to monitoring guidelines and medication adherence to achieve therapeutic efficacy . achieve control of diabetes as evidenced by A1c  through collaboration with PharmD and provider.   Interventions: . 1:1 collaboration with [name], MD regarding development and update of comprehensive plan of care as evidenced by provider attestation and co-signature . Inter-disciplinary care team collaboration (see longitudinal plan of care) . Comprehensive medication review performed; medication list updated in electronic medical record  Hypertension (BP goal <140/90) -Controlled -Current treatment: . Lisinopril 10mg , 1 tablet once daily -Medications previously tried: isosorbide  -Current home readings: not checking regularly -Current dietary habits: did not discuss -Current exercise habits: did not discuss -Denies hypotensive/hypertensive symptoms -Educated on Exercise goal of 150 minutes per week; Importance of home blood pressure monitoring; -Counseled to monitor BP at home  weekly, document, and provide log at future appointments -Counseled on diet and exercise extensively Recommended to continue current medication  Hyperlipidemia: (LDL goal < 70) -Controlled -Current treatment: . Rosuvastatin 20mg , 1 tablet once daily -Medications previously tried:  rosuvastatin (elevated BGs), simvastatin (myalgias)  -Current dietary patterns: did not discuss -Current exercise habits: did not discuss -Educated on Cholesterol goals;  Importance of limiting foods high in cholesterol; Exercise goal of 150 minutes per week; -Recommended to continue current medication  Diabetes (A1c goal <7%) -Uncontrolled -Current medications:  Glimepiride 4mg , 1 tablet once daily with breakfast  Empagliflozin (Jardiance) 10mg , 1 tablet once daily  Metformin 500mg , 2 tablets twice daily  Pioglitazone (Actos) 45mg , 1 tablet once daily  Ozempic, inject 0.25 mg once weekly -Medications previously tried: Onglyza (cost), Januvia (switch to GLP1), Trulicity (nausea)  -Current home glucose readings . fasting glucose: 120, 79-80 . post prandial glucose: higher in the afternoon -Denies hypoglycemic/hyperglycemic symptoms -Current meal patterns:  . breakfast: n/a  . lunch: n/a  . dinner: n/a . snacks: used to eat little debbies and honeybuns and cut them out; eating peanut butter crackers, cream cheese and chives, and desserts only every once in a while . drinks: n/a -Current exercise: did not discuss -Educated on Exercise goal of 150 minutes per week; Benefits of routine self-monitoring of blood sugar; Carbohydrate counting and/or plate method -Counseled to check feet daily and get yearly eye exams -Counseled on diet and exercise extensively Recommended to continue current medication Recommended increasing Ozempic to 0.5 mg after 1 month  CKD (Goal: improve kidney function) -Controlled -Current treatment  . Jardiance 10 mg 1 tablet daily -Medications previously tried: none   -Recommended to continue current medication  Primary prevention of ASCVD (Goal: prevent heart attacks and strokes) -  Controlled -Current treatment  . Aspirin 81 mg 1 tablet daily -Medications previously tried: none  -Counseled on monitoring for bleeding and bruising  ED (Goal: minimize symptoms) -Controlled -Current treatment  . Cialis 5 mg 1 tablet as needed -Medications previously tried: sildenafil (no longer effective)  -Recommended to continue current medication   Health Maintenance -Vaccine gaps: shingrix -Current therapy:  Marland Kitchen Vitamin B12 1000 mcg 1 tablet daily -Educated on Cost vs benefit of each product must be carefully weighed by individual consumer -Patient is satisfied with current therapy and denies issues -Recommended to continue current medication  Patient Goals/Self-Care Activities . Patient will:  - take medications as prescribed check glucose daily, document, and provide at future appointments check blood pressure weekly, document, and provide at future appointments target a minimum of 150 minutes of moderate intensity exercise weekly engage in dietary modifications by eating more non-starchy vegetables than carbs  Follow Up Plan: Telephone follow up appointment with care management team member scheduled for: 3 months       Patient verbalizes understanding of instructions provided today and agrees to view in [place].  Telephone follow up appointment with pharmacy team member scheduled for: 3 months  Viona Gilmore, [facility]  Diabetes Mellitus and Nutrition, Adult When you have diabetes, or diabetes mellitus, it is very important to have healthy eating habits because your blood sugar (glucose) levels are greatly affected by what you eat and drink. Eating healthy foods in the right amounts, at about the same times every day, can help you:  Control your blood glucose.  Lower your risk of heart disease.  Improve your blood pressure.  Reach or maintain a  healthy weight. What can affect my meal plan? Every person with diabetes is different, and each person has different needs for a meal plan. Your health care provider may recommend that you work with a dietitian to make a meal plan that is best for you. Your meal plan may vary depending on factors such as:  The calories you need.  The medicines you take.  Your weight.  Your blood glucose, blood pressure, and cholesterol levels.  Your activity level.  Other health conditions you have, such as heart or kidney disease. How do carbohydrates affect me? Carbohydrates, also called carbs, affect your blood glucose level more than any other type of food. Eating carbs naturally raises the amount of glucose in your blood. Carb counting is a method for keeping track of how many carbs you eat. Counting carbs is important to keep your blood glucose at a healthy level, especially if you use insulin or take certain oral diabetes medicines. It is important to know how many carbs you can safely have in each meal. This is different for every person. Your dietitian can help you calculate how many carbs you should have at each meal and for each snack. How does alcohol affect me? Alcohol can cause a sudden decrease in blood glucose (hypoglycemia), especially if you use insulin or take certain oral diabetes medicines. Hypoglycemia can be a life-threatening condition. Symptoms of hypoglycemia, such as sleepiness, dizziness, and confusion, are similar to symptoms of having too much alcohol.  Do not drink alcohol if: ? Your health care provider tells you not to drink. ? You are pregnant, may be pregnant, or are planning to become pregnant.  If you drink alcohol: ? Do not drink on an empty stomach. ? Limit how much you use to:  0-1 drink a day for women.  0-2 drinks a day  for men. ? Be aware of how much alcohol is in your drink. In the [place] one drink equals one 12 oz bottle of beer (355 mL), one 5 oz glass of  wine (148 mL), or one 1 oz glass of hard liquor (44 mL). ? Keep yourself hydrated with water, diet soda, or unsweetened iced tea.  Keep in mind that regular soda, juice, and other mixers may contain a lot of sugar and must be counted as carbs. What are tips for following this plan? Reading food labels  Start by checking the serving size on the "Nutrition Facts" label of packaged foods and drinks. The amount of calories, carbs, fats, and other nutrients listed on the label is based on one serving of the item. Many items contain more than one serving per package.  Check the total grams (g) of carbs in one serving. You can calculate the number of servings of carbs in one serving by dividing the total carbs by 15. For example, if a food has 30 g of total carbs per serving, it would be equal to 2 servings of carbs.  Check the number of grams (g) of saturated fats and trans fats in one serving. Choose foods that have a low amount or none of these fats.  Check the number of milligrams (mg) of salt (sodium) in one serving. Most people should limit total sodium intake to less than 2,300 mg per day.  Always check the nutrition information of foods labeled as "low-fat" or "nonfat." These foods may be higher in added sugar or refined carbs and should be avoided.  Talk to your dietitian to identify your daily goals for nutrients listed on the label. Shopping  Avoid buying canned, pre-made, or processed foods. These foods tend to be high in fat, sodium, and added sugar.  Shop around the outside edge of the grocery store. This is where you will most often find fresh fruits and vegetables, bulk grains, fresh meats, and fresh dairy. Cooking  Use low-heat cooking methods, such as baking, instead of high-heat cooking methods like deep frying.  Cook using healthy oils, such as olive, canola, or sunflower oil.  Avoid cooking with butter, cream, or high-fat meats. Meal planning  Eat meals and snacks  regularly, preferably at the same times every day. Avoid going long periods of time without eating.  Eat foods that are high in fiber, such as fresh fruits, vegetables, beans, and whole grains. Talk with your dietitian about how many servings of carbs you can eat at each meal.  Eat 4-6 oz (112-168 g) of lean protein each day, such as lean meat, chicken, fish, eggs, or tofu. One ounce (oz) of lean protein is equal to: ? 1 oz (28 g) of meat, chicken, or fish. ? 1 egg. ?  cup (62 g) of tofu.  Eat some foods each day that contain healthy fats, such as avocado, nuts, seeds, and fish.   What foods should I eat? Fruits Berries. Apples. Oranges. Peaches. Apricots. Plums. Grapes. Mango. Papaya. Pomegranate. Kiwi. Cherries. Vegetables Lettuce. Spinach. Leafy greens, including kale, chard, collard greens, and mustard greens. Beets. Cauliflower. Cabbage. Broccoli. Carrots. Green beans. Tomatoes. Peppers. Onions. Cucumbers. Brussels sprouts. Grains Whole grains, such as whole-wheat or whole-grain bread, crackers, tortillas, cereal, and pasta. Unsweetened oatmeal. Quinoa. Brown or wild rice. Meats and other proteins Seafood. Poultry without skin. Lean cuts of poultry and beef. Tofu. Nuts. Seeds. Dairy Low-fat or fat-free dairy products such as milk, yogurt, and cheese. The items listed above  may not be a complete list of foods and beverages you can eat. Contact a dietitian for more information. What foods should I avoid? Fruits Fruits canned with syrup. Vegetables Canned vegetables. Frozen vegetables with butter or cream sauce. Grains Refined white flour and flour products such as bread, pasta, snack foods, and cereals. Avoid all processed foods. Meats and other proteins Fatty cuts of meat. Poultry with skin. Breaded or fried meats. Processed meat. Avoid saturated fats. Dairy Full-fat yogurt, cheese, or milk. Beverages Sweetened drinks, such as soda or iced tea. The items listed above may not  be a complete list of foods and beverages you should avoid. Contact a dietitian for more information. Questions to ask a health care provider  Do I need to meet with a diabetes educator?  Do I need to meet with a dietitian?  What number can I call if I have questions?  When are the best times to check my blood glucose? Where to find more information:  American Diabetes Association: diabetes.org  Academy of Nutrition and Dietetics: www.eatright.[employer] of Diabetes and Digestive and Kidney Diseases: [employer]  Association of Diabetes Care and Education Specialists: www.diabeteseducator.org Summary  It is important to have healthy eating habits because your blood sugar (glucose) levels are greatly affected by what you eat and drink.  A healthy meal plan will help you control your blood glucose and maintain a healthy lifestyle.  Your health care provider may recommend that you work with a dietitian to make a meal plan that is best for you.  Keep in mind that carbohydrates (carbs) and alcohol have immediate effects on your blood glucose levels. It is important to count carbs and to use alcohol carefully. This information is not intended to replace advice given to you by your health care provider. Make sure you discuss any questions you have with your health care provider. Document Revised: 07/25/2019 Document Reviewed: 07/25/2019 Elsevier Patient Education  2021 [employer].

## 2020-12-09 ENCOUNTER — Encounter: Payer: Self-pay | Admitting: Family Medicine

## 2020-12-09 ENCOUNTER — Ambulatory Visit (INDEPENDENT_AMBULATORY_CARE_PROVIDER_SITE_OTHER): Payer: Medicare Other | Admitting: Family Medicine

## 2020-12-09 ENCOUNTER — Other Ambulatory Visit: Payer: Self-pay

## 2020-12-09 VITALS — BP 130/70 | HR 68 | Temp 97.9°F | Wt 220.8 lb

## 2020-12-09 DIAGNOSIS — I10 Essential (primary) hypertension: Secondary | ICD-10-CM | POA: Diagnosis not present

## 2020-12-09 DIAGNOSIS — E1165 Type 2 diabetes mellitus with hyperglycemia: Secondary | ICD-10-CM

## 2020-12-09 LAB — POCT GLYCOSYLATED HEMOGLOBIN (HGB A1C): Hemoglobin A1C: 7.9 % — AB (ref 4.0–5.6)

## 2020-12-09 NOTE — Progress Notes (Signed)
Established Patient Office Visit  Subjective:  Patient ID: Lucas Warren, male    DOB: Oct 06, 1945  Age: 75 y.o. MRN: [id number]  CC:  Chief Complaint  Patient presents with  . Follow-up    diabetes    HPI Lucas Warren presents for medical follow-up.  He has type 2 diabetes with history of poor control.  Last A1c prior today was 8.3%.  We recently started Ozempic.  He had previous nausea with Trulicity.  He is tolerating Ozempic 0.25 mg subcutaneous once weekly and is on week 3.  His Januvia was stopped recently.  He remains on other medications for diabetes including Actos, Metformin, glimepiride, and Jardiance.  No recent hypoglycemia.  Bring the log of home blood sugars and these are fairly consistently ranging from around 7 days to 130 fasting.  He has lost about 3 pounds.  No nausea.  He has hypertension treated with lisinopril.  Recent cardiac catheter revealed no significant coronary disease.  He has been very sedentary over the winter.  Usually plays golf but has not been out much lately.  He had recent home visit from Doctor, general practice.  He states his A1c on Friday was 6.3%.  Past Medical History:  Diagnosis Date  . Allergy    seasonal  . Anxiety   . Chest pain, atypical   . Colon polyps    hyperplastic 2003  . DJD (degenerative joint disease)   . DM (diabetes mellitus) ([place])   . GERD (gastroesophageal reflux disease)   . History of headache   . Hypercholesteremia   . Psoriasis     Past Surgical History:  Procedure Laterality Date  . CATARACT EXTRACTION, BILATERAL Bilateral    Dr. Tommy Rainwater;   . INGUINAL HERNIA REPAIR  1989  . LEFT HEART CATH AND CORONARY ANGIOGRAPHY N/A 05/20/2020   Procedure: LEFT HEART CATH AND CORONARY ANGIOGRAPHY;  Surgeon: Jettie Booze, MD;  Location: [facility];  Service: Cardiovascular;  Laterality: N/A;  . NASAL SEPTUM SURGERY  1989    Family History  Problem Relation Age of Onset  . Lung cancer Mother   .  Alzheimer's disease Father   . Heart disease Father   . Leukemia Sister     Social History   Socioeconomic History  . Marital status: Divorced    Spouse name: carolyn  . Number of children: Not on file  . Years of education: Not on file  . Highest education level: Not on file  Occupational History    Comment: Patent examiner  Tobacco Use  . Smoking status: Former Smoker    Packs/day: 2.00    Years: 20.00    Pack years: 40.00    Types: Cigarettes    Quit date: 08/31/1981    Years since quitting: 39.3  . Smokeless tobacco: Never Used  Vaping Use  . Vaping Use: Never used  Substance and Sexual Activity  . Alcohol use: Yes    Comment: rarely a beer  . Drug use: No  . Sexual activity: Not on file  Other Topics Concern  . Not on file  Social History Narrative  . Not on file   Social Determinants of Health   Financial Resource Strain: Low Risk   . Difficulty of Paying Living Expenses: Not hard at all  Food Insecurity: No Food Insecurity  . Worried About Charity fundraiser in the Last Year: Never true  . Ran Out of Food in the Last Year: Never true  Transportation Needs: No Transportation Needs  . Lack of Transportation (Medical): No  . Lack of Transportation (Non-Medical): No  Physical Activity: Inactive  . Days of Exercise per Week: 0 days  . Minutes of Exercise per Session: 0 min  Stress: No Stress Concern Present  . Feeling of Stress : Not at all  Social Connections: Moderately Integrated  . Frequency of Communication with Friends and Family: More than three times a week  . Frequency of Social Gatherings with Friends and Family: Three times a week  . Attends Religious Services: More than 4 times per year  . Active Member of Clubs or Organizations: Yes  . Attends Archivist Meetings: More than 4 times per year  . Marital Status: Divorced  Human resources officer Violence: Not At Risk  . Fear of Current or Ex-Partner: No  . Emotionally Abused: No  .  Physically Abused: No  . Sexually Abused: No    Outpatient Medications Prior to Visit  Medication Sig Dispense Refill  . aspirin 81 MG tablet Take 1 tablet by mouth at bedtime.     . empagliflozin (JARDIANCE) 10 MG TABS tablet Take 1 tablet (10 mg total) by mouth daily. 7 tablet 0  . glimepiride (AMARYL) 4 MG tablet Take 1 tablet by mouth once daily with breakfast 90 tablet 3  . glucose blood (ONETOUCH ULTRA) test strip Use one strip twice daily to check blood sugars 180 each 3  . Lancets (ONETOUCH DELICA PLUS [id number]) MISC USE 1  TO CHECK GLUCOSE ONCE DAILY 100 each 4  . Lancets (ONETOUCH DELICA PLUS [id number]) MISC USE 1  TO CHECK GLUCOSE TWICE DAILY 180 each 3  . lisinopril (ZESTRIL) 10 MG tablet Take 1 tablet (10 mg total) by mouth daily. 90 tablet 3  . metFORMIN (GLUCOPHAGE) 500 MG tablet Take 2 tablets (1,000 mg total) by mouth 2 (two) times daily with a meal. 180 tablet 3  . nitroGLYCERIN (NITROSTAT) 0.4 MG SL tablet Take one tablet sublingual as needed for chest pain up to three doses as needed. (Patient taking differently: Place 0.4 mg under the tongue every 5 (five) minutes as needed for chest pain.) 20 tablet 0  . pioglitazone (ACTOS) 45 MG tablet Take 1 tablet (45 mg total) by mouth daily. 90 tablet 3  . rosuvastatin (CRESTOR) 20 MG tablet Take 1 tablet (20 mg total) by mouth daily. 90 tablet 3  . Semaglutide,0.25 or 0.5MG /DOS, (OZEMPIC, 0.25 OR 0.5 MG/DOSE,) 2 MG/1.5ML SOPN Start 0.25 mg [place] once weekly for 4 weeks and then increase to 0.5 mg [place] weekly. 1.5 mL 11  . tadalafil (CIALIS) 20 MG tablet Take one every other day as needed for erectile dysfunction. 30 tablet 3  . vitamin B-12 (CYANOCOBALAMIN) 1000 MCG tablet Take 1,000 mcg by mouth at bedtime.      No facility-administered medications prior to visit.    Allergies  Allergen Reactions  . Lisinopril Cough  . Erythromycin     REACTION: abd pain  . Trulicity [Dulaglutide] Nausea Only    ROS Review of Systems  Eyes:  Negative for visual disturbance.  Respiratory: Negative for cough and chest tightness.   Cardiovascular: Negative for chest pain, palpitations and leg swelling.  Endocrine: Negative for polydipsia and polyuria.  Neurological: Negative for dizziness, syncope, weakness, light-headedness and headaches.      Objective:    Physical Exam Constitutional:      Appearance: He is well-developed.  HENT:     Right Ear: External ear normal.  Left Ear: External ear normal.  Eyes:     Pupils: Pupils are equal, round, and reactive to light.  Neck:     Thyroid: No thyromegaly.  Cardiovascular:     Rate and Rhythm: Normal rate and regular rhythm.  Pulmonary:     Effort: Pulmonary effort is normal. No respiratory distress.     Breath sounds: Normal breath sounds. No wheezing or rales.  Musculoskeletal:     Cervical back: Neck supple.     Comments: Trace edema right and left legs bilaterally  Neurological:     Mental Status: He is alert and oriented to person, place, and time.     BP 130/70 (BP Location: Left Arm, Patient Position: Sitting, Cuff Size: Normal)   Pulse 68   Temp 97.9 F (36.6 C) (Oral)   Wt 220 lb 12.8 oz (100.2 kg)   SpO2 96%   BMI 29.95 kg/m  Wt Readings from Last 3 Encounters:  12/09/20 220 lb 12.8 oz (100.2 kg)  09/10/20 223 lb (101.2 kg)  08/08/20 227 lb 12.8 oz (103.3 kg)     There are no preventive care reminders to display for this patient.  There are no preventive care reminders to display for this patient.  Lab Results  Component Value Date   TSH 0.86 07/24/2015   Lab Results  Component Value Date   WBC 8.3 05/15/2020   HGB 12.9 (L) 05/15/2020   HCT 38.6 05/15/2020   MCV 86 05/15/2020   PLT 227 05/15/2020   Lab Results  Component Value Date   NA 140 06/11/2020   K 4.3 06/11/2020   CO2 23 06/11/2020   GLUCOSE 95 06/11/2020   BUN 28 (H) 06/11/2020   CREATININE 1.62 (H) 06/11/2020   BILITOT 0.4 06/11/2020   ALKPHOS 51 08/07/2019   AST 12  06/11/2020   ALT 8 (L) 06/11/2020   PROT 6.7 06/11/2020   ALBUMIN 4.5 08/07/2019   CALCIUM 9.1 06/11/2020   GFR 42.45 (L) 08/15/2019   Lab Results  Component Value Date   CHOL 127 06/11/2020   Lab Results  Component Value Date   HDL 50 06/11/2020   Lab Results  Component Value Date   LDLCALC 57 06/11/2020   Lab Results  Component Value Date   TRIG 117 06/11/2020   Lab Results  Component Value Date   CHOLHDL 2.5 06/11/2020   Lab Results  Component Value Date   HGBA1C 7.9 (A) 12/09/2020      Assessment & Plan:   #1 type 2 diabetes.  Suboptimally controlled with A1c today 7.9%.  However, he is only been on 1 month of Ozempic 0.25 mg and tolerating well.  Next week titrate up to 0.5 mg once weekly and plan follow-up in 3 months and suspect A1c will be improved at that time.  Watch closely for any hypoglycemia  #2 hypertension stable -Continue lisinopril 10 mg daily -We have encouraged him to try to step up his aerobic activity such as walking  No orders of the defined types were placed in this encounter.   Follow-up: Return in about 3 months (around 03/10/2021).    [name], MD

## 2020-12-09 NOTE — Patient Instructions (Signed)
In one week go ahead and increase the Ozempic to 0.5 mg [place] once weekly.   Let's plan on 3 month follow up.  Suspect we will see additional A1C lowering in 3 months.

## 2020-12-20 ENCOUNTER — Telehealth: Payer: Self-pay | Admitting: Pharmacist

## 2020-12-20 NOTE — Chronic Care Management (AMB) (Addendum)
Chronic Care Management Pharmacy Assistant   Name: Lucas Warren  MRN: [id number] DOB: 09-May-1946  Reason for Encounter: Medication Review-Medication Coordination Call   Recent office visits:  04.11.2022 [name], MD Family Medicine  Recent consult visits:  None  Hospital visits:  None in previous 6 months  Medications: Outpatient Encounter Medications as of 12/20/2020  Medication Sig   aspirin 81 MG tablet Take 1 tablet by mouth at bedtime.    empagliflozin (JARDIANCE) 10 MG TABS tablet Take 1 tablet (10 mg total) by mouth daily.   glimepiride (AMARYL) 4 MG tablet Take 1 tablet by mouth once daily with breakfast   glucose blood (ONETOUCH ULTRA) test strip Use one strip twice daily to check blood sugars   Lancets (ONETOUCH DELICA PLUS [id number]) MISC USE 1  TO CHECK GLUCOSE ONCE DAILY   Lancets (ONETOUCH DELICA PLUS [id number]) MISC USE 1  TO CHECK GLUCOSE TWICE DAILY   lisinopril (ZESTRIL) 10 MG tablet Take 1 tablet (10 mg total) by mouth daily.   metFORMIN (GLUCOPHAGE) 500 MG tablet Take 2 tablets (1,000 mg total) by mouth 2 (two) times daily with a meal.   nitroGLYCERIN (NITROSTAT) 0.4 MG SL tablet Take one tablet sublingual as needed for chest pain up to three doses as needed. (Patient taking differently: Place 0.4 mg under the tongue every 5 (five) minutes as needed for chest pain.)   pioglitazone (ACTOS) 45 MG tablet Take 1 tablet (45 mg total) by mouth daily.   rosuvastatin (CRESTOR) 20 MG tablet Take 1 tablet (20 mg total) by mouth daily.   Semaglutide,0.25 or 0.5MG /DOS, (OZEMPIC, 0.25 OR 0.5 MG/DOSE,) 2 MG/1.5ML SOPN Start 0.25 mg [place] once weekly for 4 weeks and then increase to 0.5 mg [place] weekly.   tadalafil (CIALIS) 20 MG tablet Take one every other day as needed for erectile dysfunction.   vitamin B-12 (CYANOCOBALAMIN) 1000 MCG tablet Take 1,000 mcg by mouth at bedtime.    No facility-administered encounter medications on file as of 12/20/2020.   Reviewed  chart for medication changes ahead of medication coordination call.  BP Readings from Last 3 Encounters:  12/09/20 130/70  09/10/20 122/68  08/08/20 124/62    Lab Results  Component Value Date   HGBA1C 7.9 (A) 12/09/2020    Patient obtains medications through Adherence Packaging  30 Days  Last adherence delivery included: Lisinopril (ZESTRIL) 10 mg: one tablet at dinner Pioglitazone (ACTOS) 45 mg: one tablet at breakfast Rosuvastatin (CRESTOR) 20 mg: one tablet at bedtime Glimepiride (AMARYL) 4 mg: one tablet at breakfast Jardiance 10 MG TABS tablet: one tablet at breakfast Metformin (GLUCOPHAGE) 500 mg:  two (2) tablets at breakfast and 2 tablets at dinner  Patient declined the following Medications last month due to PRN use/additional supply on hand. Nitroglycerin (NITROSTAT) 0.4 MG SL tablet Tadalafil 20 MG tablet PRN Vitamin B-12 (CYANOCOBALAMIN) 1000 MCG tablet Glucose blood (ONETOUCH ULTRA) test strip  Lancets (ONETOUCH DELICA PLUS [id number]) MISC: use 1 to check glucose twice daily       Patient is due for next adherence delivery on: 12/27/2020. Called patient and reviewed medications and coordinated delivery. This delivery to include: Lisinopril (ZESTRIL) 10 mg: one tablet at dinner Pioglitazone (ACTOS) 45 mg: one tablet at breakfast Rosuvastatin (CRESTOR) 20 mg: one tablet at bedtime Glimepiride (AMARYL) 4 mg: one tablet at breakfast Jardiance 10 MG TABS tablet: one tablet at breakfast Metformin (GLUCOPHAGE) 500 mg:  two (2) tablets at breakfast and 2 tablets at dinner  Patient  declined the following Medications last month due to PRN use/additional supply on hand. Nitroglycerin (NITROSTAT) 0.4 MG SL tablet Tadalafil 20 MG tablet PRN Vitamin B-12 (CYANOCOBALAMIN) 1000 MCG tablet Glucose blood (ONETOUCH ULTRA) test strip  Lancets (ONETOUCH DELICA PLUS [id number]) MISC: use 1 to check glucose twice daily  He currently does not need refills. Confirmed delivery date of  12/27/2020, advised patient that pharmacy will contact them the morning of delivery.   Star Rating Drugs:  Dispensed Quantity Pharmacy  Lisinopril 10 mg 03.29.2022 30 [facility]  Rosuvastatin 20 mg 03.29.2022 30 [facility]  Jardiance 03.29.2022 30 [facility]  Metformin 03.31.2022 120 [facility]   Amilia Revonda [facility], [place] [phone]

## 2021-01-20 ENCOUNTER — Telehealth: Payer: Self-pay | Admitting: Pharmacist

## 2021-01-20 NOTE — Chronic Care Management (AMB) (Signed)
Chronic Care Management Pharmacy Assistant   Name: Lucas Warren  MRN: [id number] DOB: Mar 24, 1946  Reason for Encounter: Medication Review/ Medication Coordination Call.    Recent office visits:  None.   Recent consult visits:  None.  Hospital visits:  None in previous 6 months  Medications: Outpatient Encounter Medications as of 01/20/2021  Medication Sig  . aspirin 81 MG tablet Take 1 tablet by mouth at bedtime.   . empagliflozin (JARDIANCE) 10 MG TABS tablet Take 1 tablet (10 mg total) by mouth daily.  Mar[place] glimepiride (AMARYL) 4 MG tablet Take 1 tablet by mouth once daily with breakfast  . glucose blood (ONETOUCH ULTRA) test strip Use one strip twice daily to check blood sugars  . Lancets (ONETOUCH DELICA PLUS [id number]) MISC USE 1  TO CHECK GLUCOSE ONCE DAILY  . Lancets (ONETOUCH DELICA PLUS [id number]) MISC USE 1  TO CHECK GLUCOSE TWICE DAILY  . lisinopril (ZESTRIL) 10 MG tablet Take 1 tablet (10 mg total) by mouth daily.  . metFORMIN (GLUCOPHAGE) 500 MG tablet Take 2 tablets (1,000 mg total) by mouth 2 (two) times daily with a meal.  . nitroGLYCERIN (NITROSTAT) 0.4 MG SL tablet Take one tablet sublingual as needed for chest pain up to three doses as needed. (Patient taking differently: Place 0.4 mg under the tongue every 5 (five) minutes as needed for chest pain.)  . pioglitazone (ACTOS) 45 MG tablet Take 1 tablet (45 mg total) by mouth daily.  . rosuvastatin (CRESTOR) 20 MG tablet Take 1 tablet (20 mg total) by mouth daily.  . Semaglutide,0.25 or 0.5MG /DOS, (OZEMPIC, 0.25 OR 0.5 MG/DOSE,) 2 MG/1.5ML SOPN Start 0.25 mg [place] once weekly for 4 weeks and then increase to 0.5 mg [place] weekly.  . tadalafil (CIALIS) 20 MG tablet Take one every other day as needed for erectile dysfunction.  . vitamin B-12 (CYANOCOBALAMIN) 1000 MCG tablet Take 1,000 mcg by mouth at bedtime.    No facility-administered encounter medications on file as of 01/20/2021.    Reviewed chart for medication  changes ahead of medication coordination call.  No OVs, Consults, or hospital visits since last care coordination call/Pharmacist visit. (If appropriate, list visit date, provider name)  No medication changes indicated OR if recent visit, treatment plan here.  BP Readings from Last 3 Encounters:  12/09/20 130/70  09/10/20 122/68  08/08/20 124/62    Lab Results  Component Value Date   HGBA1C 7.9 (A) 12/09/2020     Patient obtains medications through Adherence Packaging  30 Days   Last adherence delivery included:   Lisinopril (ZESTRIL) 10 mg: one tablet at dinner  Pioglitazone (ACTOS) 45 mg: one tablet at breakfast  Rosuvastatin (CRESTOR) 20 mg: one tablet at bedtime  Glimepiride (AMARYL) 4 mg: one tablet at breakfast  Jardiance 10 MG TABS tablet: one tablet at breakfast  Metformin (GLUCOPHAGE) 500 mg:  two (2) tablets at breakfast and 2 tablets at dinner   Patient declined (meds) last month due to PRN use/additional supply on hand.  Nitroglycerin (NITROSTAT) 0.4 MG SL tablet  Tadalafil 20 MG tablet PRN  Vitamin B-12 (CYANOCOBALAMIN) 1000 MCG tablet  Patient is due for next adherence delivery on: 01/29/21. Called patient and reviewed medications and coordinated delivery.  This delivery to include:  Lisinopril (ZESTRIL) 10 mg: one tablet at dinner  Pioglitazone (ACTOS) 45 mg: one tablet at breakfast  Rosuvastatin (CRESTOR) 20 mg: one tablet at bedtime  Glimepiride (AMARYL) 4 mg: one tablet at breakfast  Jardiance 10 MG TABS  tablet: one tablet at breakfast  Metformin (GLUCOPHAGE) 500 mg:  two (2) tablets at breakfast and 2 tablets at dinner  Glucose blood (ONETOUCH ULTRA) test strip   Lancets (ONETOUCH DELICA PLUS [id number]) MISC: use 1 to check glucose twice daily   Patient declined the following medications (meds) due to (reason)  Nitroglycerin (NITROSTAT) 0.4 MG SL tablet  Tadalafil 20 MG tablet PRN  Vitamin B-12 (CYANOCOBALAMIN) 1000 MCG  tablet  Notes:  Spoke with patient and confirmed delivery and medications needed and not needed in this delivery. Patient confirmed medications as above. Also, spoke with patient and reminded him of his appointment for tomorrow 01/22/21 with [name] clinical pharmacist. Patient stated he needed to cancel that appointment and reschedule to 02/06/21 at 4pm by phone. Message sent to [facility] ot cancel and reschedule.   Confirmed delivery date of 01/29/21, advised patient that pharmacy will contact them the morning of delivery.  Star Rating Drugs:   Lisinopril 10mg  - last filled on 11/26/20 30DS at [facility]  Rosuvastatin 20mg  - last filled on 11/26/20 at [facility]  jardiance 10mg  - last filled on 11/26/20 30DS at [facility]  Glimepiride 4mg  - last filled on 11/26/20 30DS at [facility]  Metformin 500mg  - last filled on 11/28/20 30DS at [facility]  semaglutide 2mg  - last filled on 09/24/20 35DS at [facility]   Pioglitazone 45mg  - last filled on 11/26/20 30DS at [facility] [phone]

## 2021-01-21 ENCOUNTER — Telehealth: Payer: Self-pay | Admitting: Pharmacist

## 2021-01-21 NOTE — Chronic Care Management (AMB) (Signed)
    Chronic Care Management Pharmacy Assistant   Name: Lucas Warren  MRN: [id number] DOB: 07-12-1946  Tried to call patient and remind of appointment on 01/22/21 with [name] the clinical pharmacist. Unable to leave voicemail to remind patient due to full voicemail.  [place]  Clinical Pharmacist Assistant [phone]

## 2021-01-22 ENCOUNTER — Telehealth: Payer: Medicare Other

## 2021-01-22 NOTE — Progress Notes (Deleted)
Chronic Care Management Pharmacy Note  01/22/2021 Name:  Lucas Warren MRN:  [id number] DOB:  [birth year]  Subjective: Lucas Warren is an 75 y.o. year old male who is a primary patient of Burchette, Alinda Sierras, MD.  The CCM team was consulted for assistance with disease management and care coordination needs.    Engaged with patient by telephone for follow up visit in response to provider referral for pharmacy case management and/or care coordination services.   Consent to Services:  The patient was given information about Chronic Care Management services, agreed to services, and gave verbal consent prior to initiation of services.  Please see initial visit note for detailed documentation.   Patient Care Team: [name], MD as PCP - General (Family Medicine) Jettie Booze, MD as PCP - Cardiology (Cardiology) Viona Gilmore, [facility] as Pharmacist (Pharmacist)  Recent office visits: 12/09/20 [name], MD: Patient presented for DM follow up. A1c decreased to 7.9%. Recommended to increase Ozempic to 0.5 mg once weekly next week.  09/10/20 CPP visit: Filled out PAP for Jardiance and Ozempic. Patient ok with trying Ozempic once he finishes his supply of Januvia.  09/10/20 [name], MD: Patient presented for DM follow up. A1c increased to 8.3%.   06/11/20 [name], MD: Patient presented for DM follow up. A1c of 7.7%. No medication changes made.  05/03/2020 - [name], MD - Patient presented for chest pressure and shortness of breath for the past 3-4 weeks. Restarted aspirin 81 mg daily and started PRN nitroglycerin SL.  Recent consult visits: 08/08/20 05/07/20 [name], MD (cardiology): Patient presented for follow up.  No changes made.  05/07/20 [name], MD (cardiology): Patient presented with chest pressure and initial work up. Switched pravastatin 40 mg to rosuvastatin 20 mg and started isosorbide mononitrate 30 mg  daily.  Hospital visits: None in previous 6 months  Objective:  Lab Results  Component Value Date   CREATININE 1.62 (H) 06/11/2020   BUN 28 (H) 06/11/2020   GFR 42.45 (L) 08/15/2019   GFRNONAA 42 (L) 06/11/2020   GFRAA 48 (L) 06/11/2020   NA 140 06/11/2020   K 4.3 06/11/2020   CALCIUM 9.1 06/11/2020   CO2 23 06/11/2020   GLUCOSE 95 06/11/2020    Lab Results  Component Value Date/Time   HGBA1C 7.9 (A) 12/09/2020 07:58 AM   HGBA1C 8.3 (A) 09/10/2020 08:05 AM   HGBA1C 8.3 (H) 12/25/2015 08:39 AM   HGBA1C 8.4 10/14/2015 12:00 AM   HGBA1C 7.3 (H) 05/22/2015 09:53 AM   GFR 42.45 (L) 08/15/2019 07:32 AM   GFR 45.36 (L) 08/07/2019 08:48 AM   MICROALBUR 2.5 06/11/2020 08:23 AM   MICROALBUR 4.3 (H) 01/10/2018 08:54 AM    Last diabetic Eye exam:  Lab Results  Component Value Date/Time   HMDIABEYEEXA No Retinopathy 04/02/2020 12:00 AM    Last diabetic Foot exam:  Lab Results  Component Value Date/Time   HMDIABFOOTEX normal 03/25/2016 12:00 AM     Lab Results  Component Value Date   CHOL 127 06/11/2020   HDL 50 06/11/2020   LDLCALC 57 06/11/2020   TRIG 117 06/11/2020   CHOLHDL 2.5 06/11/2020    Hepatic Function Latest Ref Rng & Units 06/11/2020 08/07/2019 11/11/2018  Total Protein 6.1 - 8.1 g/dL 6.7 7.0 7.2  Albumin 3.5 - 5.2 g/dL - 4.5 4.6  AST 10 - 35 U/L _0 ALT 9 - 46 U/L 8(L) 7 9  Alk Phosphatase 39 -  117 U/L - 51 58  Total Bilirubin 0.2 - 1.2 mg/dL 0.4 0.5 0.5  Bilirubin, Direct 0.0 - 0.3 mg/dL - 0.1 -    Lab Results  Component Value Date/Time   TSH 0.86 07/24/2015 12:35 PM   TSH 0.71 12/05/2012 10:44 AM    CBC Latest Ref Rng & Units 05/15/2020 11/11/2018 08/19/2016  WBC 3.4 - 10.8 x10E3/uL 8.3 9.1 9.2  Hemoglobin 13.0 - 17.7 g/dL 12.9(L) 13.9 12.6(L)  Hematocrit 37.5 - 51.0 % 38.6 42.0 37.4(L)  Platelets 150 - 450 x10E3/uL 227 222.0 198.0    No results found for: VD25OH  Clinical ASCVD: No  The ASCVD Risk score Mikey Bussing DC Jr., et al., 2013)  failed to calculate for the following reasons:   The valid total cholesterol range is 130 to 320 mg/dL    Depression screen [facility] 2/9 04/26/2020 11/06/2019 01/18/2018  Decreased Interest 0 0 0  Down, Depressed, Hopeless 0 0 0  PHQ - 2 Score 0 0 0  Altered sleeping 0 0 -  Tired, decreased energy 0 0 -  Change in appetite 0 0 -  Feeling bad or failure about yourself  0 0 -  Trouble concentrating 0 0 -  Moving slowly or fidgety/restless 0 0 -  Suicidal thoughts 0 0 -  PHQ-9 Score 0 0 -  Difficult doing work/chores Not difficult at all Not difficult at all -      Social History   Tobacco Use  Smoking Status Former Smoker  . Packs/day: 2.00  . Years: 20.00  . Pack years: 40.00  . Types: Cigarettes  . Quit date: 08/31/1981  . Years since quitting: 39.4  Smokeless Tobacco Never Used   BP Readings from Last 3 Encounters:  12/09/20 130/70  09/10/20 122/68  08/08/20 124/62   Pulse Readings from Last 3 Encounters:  12/09/20 68  09/10/20 65  08/08/20 74   Wt Readings from Last 3 Encounters:  12/09/20 220 lb 12.8 oz (100.2 kg)  09/10/20 223 lb (101.2 kg)  08/08/20 227 lb 12.8 oz (103.3 kg)   BMI Readings from Last 3 Encounters:  12/09/20 29.95 kg/m  09/10/20 30.24 kg/m  08/08/20 30.90 kg/m    Assessment/Interventions: Review of patient past medical history, allergies, medications, health status, including review of consultants reports, laboratory and other test data, was performed as part of comprehensive evaluation and provision of chronic care management services.   SDOH:  (Social Determinants of Health) assessments and interventions performed: No   CCM Care Plan  Allergies  Allergen Reactions  . Lisinopril Cough  . Erythromycin     REACTION: abd pain  . Trulicity [Dulaglutide] Nausea Only    Medications Reviewed Today    Reviewed by Rebecca Eaton, [place] (Certified Medical Assistant) on 12/09/20 at 0751  Med List Status: <None>  Medication Order Taking? Sig  Documenting Provider Last Dose Status Informant  aspirin 81 MG tablet [id number] Yes Take 1 tablet by mouth at bedtime.  [provider] Taking Active Self  empagliflozin (JARDIANCE) 10 MG TABS tablet [id number] Yes Take 1 tablet (10 mg total) by mouth daily. [name], MD Taking Active   glimepiride (AMARYL) 4 MG tablet [id number] Yes Take 1 tablet by mouth once daily with breakfast Burchette, Alinda Sierras, MD Taking Active   glucose blood (ONETOUCH ULTRA) test strip [id number] Yes Use one strip twice daily to check blood sugars Burchette, Alinda Sierras, MD Taking Active   Lancets (ONETOUCH DELICA PLUS AJOINO67E) [place] [id number] Yes USE 1  TO  CHECK GLUCOSE ONCE DAILY Burchette, Alinda Sierras, MD Taking Active Self  Lancets (ONETOUCH DELICA PLUS IWOEHO12Y) [place] [id number] Yes USE 1  TO CHECK GLUCOSE TWICE DAILY Burchette, Alinda Sierras, MD Taking Active   lisinopril (ZESTRIL) 10 MG tablet [id number] Yes Take 1 tablet (10 mg total) by mouth daily. [name], MD Taking Active   metFORMIN (GLUCOPHAGE) 500 MG tablet [id number] Yes Take 2 tablets (1,000 mg total) by mouth 2 (two) times daily with a meal. Burchette, Alinda Sierras, MD Taking Active   nitroGLYCERIN (NITROSTAT) 0.4 MG SL tablet [id number] Yes Take one tablet sublingual as needed for chest pain up to three doses as needed.  Patient taking differently: Place 0.4 mg under the tongue every 5 (five) minutes as needed for chest pain.   [name], MD Taking Active   pioglitazone (ACTOS) 45 MG tablet [id number] Yes Take 1 tablet (45 mg total) by mouth daily. [name], MD Taking Active   rosuvastatin (CRESTOR) 20 MG tablet [id number] Yes Take 1 tablet (20 mg total) by mouth daily. Jettie Booze, MD Taking Active   Semaglutide,0.25 or 0.5MG/DOS, (OZEMPIC, 0.25 OR 0.5 MG/DOSE,) 2 MG/1.5ML SOPN [id number] Yes Start 0.25 mg [place] once weekly for 4 weeks and then increase to 0.5 mg [place] weekly. [name], MD Taking Active   tadalafil (CIALIS)  20 MG tablet [id number] Yes Take one every other day as needed for erectile dysfunction. [name], MD Taking Active   vitamin B-12 (CYANOCOBALAMIN) 1000 MCG tablet [id number] Yes Take 1,000 mcg by mouth at bedtime.  [provider] Taking Active Self          Patient Active Problem List   Diagnosis Date Noted  . Erectile dysfunction 03/11/2020  . CKD (chronic kidney disease) stage 3, GFR 30-59 ml/min (HCC) 11/06/2019  . Laryngopharyngeal reflux (LPR) 06/12/2019  . Perennial allergic rhinitis 06/12/2019  . Chronic right shoulder pain 09/24/2016  . Cough 08/19/2016  . Hemoptysis 08/19/2016  . Normocytic anemia 06/29/2016  . Hypertension 12/25/2015  . Obesity (BMI 30-39.9) 02/02/2014  . Acute URI 10/10/2012  . Overweight 04/05/2012  . Venous insufficiency 04/02/2011  . HEADACHE 02/25/2008  . CHEST PAIN, ATYPICAL 02/25/2008  . HYPERCHOLESTEROLEMIA 02/24/2008  . Poorly controlled type 2 diabetes mellitus ([facility]) 02/23/2008  . ANXIETY 02/23/2008  . GERD 02/23/2008  . PSORIASIS 02/23/2008  . DEGENERATIVE JOINT DISEASE 02/23/2008  . COLONIC POLYPS, HX OF 02/23/2008    Immunization History  Administered Date(s) Administered  . Fluad Quad(high Dose 65+) 05/09/2019, 06/11/2020  . Influenza Split 10/05/2011  . Influenza Whole 07/16/2009  . Influenza, High Dose Seasonal PF 05/22/2015, 06/29/2016, 06/15/2017, 05/20/2018  . Influenza,inj,Quad PF,6+ Mos 07/07/2013, 05/04/2014  . PFIZER(Purple Top)SARS-COV-2 Vaccination 10/22/2019, 11/15/2019, 07/09/2020  . Pneumococcal Conjugate-13 08/21/2014  . Pneumococcal Polysaccharide-23 12/05/2012  . Pneumococcal-Unspecified 10/11/2007  . Td 03/09/2006  . Tdap 03/25/2016  . Zoster 03/31/2016    Conditions to be addressed/monitored:  Hypertension, Hyperlipidemia, Diabetes, Chronic Kidney Disease and ED  There are no care plans that you recently modified to display for this patient.  Patient Care Plan: CCM Pharmacy Care Plan     Problem Identified: Problem: Hypertension, Hyperlipidemia, Diabetes, Chronic Kidney Disease and ED     Long-Range Goal: Patient-Specific Goal   Start Date: 11/20/2020  Expected End Date: 11/20/2021  This Visit's Progress: On track  Priority: High  Note:   Current Barriers:  . Unable to independently monitor therapeutic efficacy . Unable to achieve control of diabetes  Pharmacist Clinical Goal(s):  Marland Kitchen Patient will achieve adherence to monitoring guidelines and medication adherence to achieve therapeutic efficacy . achieve control of diabetes as evidenced by A1c  through collaboration with PharmD and provider.   Interventions: . 1:1 collaboration with [name], MD regarding development and update of comprehensive plan of care as evidenced by provider attestation and co-signature . Inter-disciplinary care team collaboration (see longitudinal plan of care) . Comprehensive medication review performed; medication list updated in electronic medical record  Hypertension (BP goal <140/90) -Controlled -Current treatment: . Lisinopril 68m, 1 tablet once daily -Medications previously tried: isosorbide  -Current home readings: not checking regularly -Current dietary habits: did not discuss -Current exercise habits: did not discuss -Denies hypotensive/hypertensive symptoms -Educated on Exercise goal of 150 minutes per week; Importance of home blood pressure monitoring; -Counseled to monitor BP at home weekly, document, and provide log at future appointments -Counseled on diet and exercise extensively Recommended to continue current medication  Hyperlipidemia: (LDL goal < 70) -Controlled -Current treatment: . Rosuvastatin 219m 1 tablet once daily -Medications previously tried:  rosuvastatin (elevated BGs), simvastatin (myalgias)  -Current dietary patterns: did not discuss -Current exercise habits: did not discuss -Educated on Cholesterol goals;  Importance of limiting  foods high in cholesterol; Exercise goal of 150 minutes per week; -Recommended to continue current medication  Diabetes (A1c goal <7%) -Uncontrolled -Current medications:  Glimepiride 4m48m1 tablet once daily with breakfast  Empagliflozin (Jardiance) 33m63m tablet once daily  Metformin 500mg66mtablets twice daily  Pioglitazone (Actos) 45mg,44mablet once daily  Ozempic, inject 0.25 mg once weekly -Medications previously tried: Onglyza (cost), Januvia (switch to GLP1), Trulicity (nausea)  -Current home glucose readings . fasting glucose: 120, 79-80 . post prandial glucose: higher in the afternoon -Denies hypoglycemic/hyperglycemic symptoms -Current meal patterns:  . breakfast: n/a  . lunch: n/a  . dinner: n/a . snacks: used to eat little debbies and honeybuns and cut them out; eating peanut butter crackers, cream cheese and chives, and desserts only every once in a while . drinks: n/a -Current exercise: did not discuss -Educated on Exercise goal of 150 minutes per week; Benefits of routine self-monitoring of blood sugar; Carbohydrate counting and/or plate method -Counseled to check feet daily and get yearly eye exams -Counseled on diet and exercise extensively Recommended to continue current medication Recommended increasing Ozempic to 0.5 mg after 1 month  CKD (Goal: improve kidney function) -Controlled -Current treatment  . Jardiance 10 mg 1 tablet daily -Medications previously tried: none  -Recommended to continue current medication  Primary prevention of ASCVD (Goal: prevent heart attacks and strokes) -Controlled -Current treatment  . Aspirin 81 mg 1 tablet daily -Medications previously tried: none  -Counseled on monitoring for bleeding and bruising  ED (Goal: minimize symptoms) -Controlled -Current treatment  . Cialis 5 mg 1 tablet as needed -Medications previously tried: sildenafil (no longer effective)  -Recommended to continue current  medication   Health Maintenance -Vaccine gaps: shingrix -Current therapy:  . VitaMarland Kitchenin B12 1000 mcg 1 tablet daily -Educated on Cost vs benefit of each product must be carefully weighed by individual consumer -Patient is satisfied with current therapy and denies issues -Recommended to continue current medication  Patient Goals/Self-Care Activities . Patient will:  - take medications as prescribed check glucose daily, document, and provide at future appointments check blood pressure weekly, document, and provide at future appointments target a minimum of 150 minutes of moderate intensity exercise weekly engage in dietary modifications by eating more non-starchy  vegetables than carbs  Follow Up Plan: Telephone follow up appointment with care management team member scheduled for: 3 months         Medication Assistance: Ozempic obtained through [employer] medication assistance program.  Enrollment ends 08/30/21    Patient's preferred pharmacy is:  [facility] - [place], [place] - [address] [place] [address] Phone: [phone] Fax: [phone]  Uses pill box? No - adherence packaging Pt endorses 100% compliance  We discussed: Benefits of medication synchronization, packaging and delivery as well as enhanced pharmacist oversight with [facility]. Patient decided to: Utilize [facility] for medication synchronization, packaging and delivery  Care Plan and Follow Up Patient Decision:  Patient agrees to Care Plan and Follow-up.  Plan: Telephone follow up appointment with care management team member scheduled for:  3 months  [name], PharmD [place] Pharmacist [facility] [phone]

## 2021-01-30 ENCOUNTER — Telehealth: Payer: Self-pay

## 2021-01-30 NOTE — Telephone Encounter (Signed)
Ozempic has been received by the office from the patient assistance program. Spoke with the patient and he is aware. He will come pick this up. Nothing further needed.

## 2021-02-05 ENCOUNTER — Telehealth: Payer: Self-pay | Admitting: Pharmacist

## 2021-02-06 ENCOUNTER — Ambulatory Visit (INDEPENDENT_AMBULATORY_CARE_PROVIDER_SITE_OTHER): Payer: Medicare Other | Admitting: Pharmacist

## 2021-02-06 DIAGNOSIS — E1165 Type 2 diabetes mellitus with hyperglycemia: Secondary | ICD-10-CM | POA: Diagnosis not present

## 2021-02-06 DIAGNOSIS — I10 Essential (primary) hypertension: Secondary | ICD-10-CM | POA: Diagnosis not present

## 2021-02-06 NOTE — Progress Notes (Signed)
Chronic Care Management Pharmacy Note  02/06/2021 Name:  Lucas Warren MRN:  [id number] DOB:  November 04, 1945  Summary: A1c not at goal < 7% but home blood sugars have improved  Recommendations/Changes made from today's visit: -Consider stopping glimepiride depending on next A1c result and increasing Ozempic as patient tolerates.  -Recommended for patient to check some post prandial blood sugars to bring to next office visit.  Plan: -Follow up in 3 months or sooner depending on results of next A1c  Subjective: Lucas Warren is an 75 y.o. year old male who is a primary patient of Burchette, Alinda Sierras, MD.  The CCM team was consulted for assistance with disease management and care coordination needs.    Engaged with patient by telephone for follow up visit in response to provider referral for pharmacy case management and/or care coordination services.   Consent to Services:  The patient was given information about Chronic Care Management services, agreed to services, and gave verbal consent prior to initiation of services.  Please see initial visit note for detailed documentation.   Patient Care Team: [name], MD as PCP - General (Family Medicine) Jettie Booze, MD as PCP - Cardiology (Cardiology) Viona Gilmore, [facility] as Pharmacist (Pharmacist)  Recent office visits: 12/09/20 [name], MD: Patient presented for DM follow up. A1c decreased to 7.9%. Recommended to increase Ozempic to 0.5 mg once weekly next week.  09/10/20 CPP visit: Filled out PAP for Jardiance and Ozempic. Patient ok with trying Ozempic once he finishes his supply of Januvia.  09/10/20 [name], MD: Patient presented for DM follow up. A1c increased to 8.3%.   06/11/20 [name], MD: Patient presented for DM follow up. A1c of 7.7%. No medication changes made.   05/03/2020 - [name], MD - Patient presented for chest pressure and shortness of breath for the past 3-4 weeks.  Restarted aspirin 81 mg daily and started PRN nitroglycerin SL.  Recent consult visits: 08/08/20 05/07/20 [name], MD (cardiology): Patient presented for follow up.  No changes made.   05/07/20 [name], MD (cardiology): Patient presented with chest pressure and initial work up. Switched pravastatin 40 mg to rosuvastatin 20 mg and started isosorbide mononitrate 30 mg daily.  Hospital visits: None in previous 6 months  Objective:  Lab Results  Component Value Date   CREATININE 1.62 (H) 06/11/2020   BUN 28 (H) 06/11/2020   GFR 42.45 (L) 08/15/2019   GFRNONAA 42 (L) 06/11/2020   GFRAA 48 (L) 06/11/2020   NA 140 06/11/2020   K 4.3 06/11/2020   CALCIUM 9.1 06/11/2020   CO2 23 06/11/2020   GLUCOSE 95 06/11/2020    Lab Results  Component Value Date/Time   HGBA1C 7.9 (A) 12/09/2020 07:58 AM   HGBA1C 8.3 (A) 09/10/2020 08:05 AM   HGBA1C 8.3 (H) 12/25/2015 08:39 AM   HGBA1C 8.4 10/14/2015 12:00 AM   HGBA1C 7.3 (H) 05/22/2015 09:53 AM   GFR 42.45 (L) 08/15/2019 07:32 AM   GFR 45.36 (L) 08/07/2019 08:48 AM   MICROALBUR 2.5 06/11/2020 08:23 AM   MICROALBUR 4.3 (H) 01/10/2018 08:54 AM    Last diabetic Eye exam:  Lab Results  Component Value Date/Time   HMDIABEYEEXA No Retinopathy 04/02/2020 12:00 AM    Last diabetic Foot exam:  Lab Results  Component Value Date/Time   HMDIABFOOTEX normal 03/25/2016 12:00 AM     Lab Results  Component Value Date   CHOL 127 06/11/2020   HDL 50 06/11/2020  LDLCALC 57 06/11/2020   TRIG 117 06/11/2020   CHOLHDL 2.5 06/11/2020    Hepatic Function Latest Ref Rng & Units 06/11/2020 08/07/2019 11/11/2018  Total Protein 6.1 - 8.1 g/dL 6.7 7.0 7.2  Albumin 3.5 - 5.2 g/dL - 4.5 4.6  AST 10 - 35 U/L 12 12 11   ALT 9 - 46 U/L 8(L) 7 9  Alk Phosphatase 39 - 117 U/L - 51 58  Total Bilirubin 0.2 - 1.2 mg/dL 0.4 0.5 0.5  Bilirubin, Direct 0.0 - 0.3 mg/dL - 0.1 -    Lab Results  Component Value Date/Time   TSH 0.86 07/24/2015 12:35 PM    TSH 0.71 12/05/2012 10:44 AM    CBC Latest Ref Rng & Units 05/15/2020 11/11/2018 08/19/2016  WBC 3.4 - 10.8 x10E3/uL 8.3 9.1 9.2  Hemoglobin 13.0 - 17.7 g/dL 12.9(L) 13.9 12.6(L)  Hematocrit 37.5 - 51.0 % 38.6 42.0 37.4(L)  Platelets 150 - 450 x10E3/uL 227 222.0 198.0    No results found for: VD25OH  Clinical ASCVD: No  The ASCVD Risk score Lucas Warren., et al., 2013) failed to calculate for the following reasons:   The valid total cholesterol range is 130 to 320 mg/dL    Depression screen [facility] 2/9 04/26/2020 11/06/2019 01/18/2018  Decreased Interest 0 0 0  Down, Depressed, Hopeless 0 0 0  PHQ - 2 Score 0 0 0  Altered sleeping 0 0 -  Tired, decreased energy 0 0 -  Change in appetite 0 0 -  Feeling bad or failure about yourself  0 0 -  Trouble concentrating 0 0 -  Moving slowly or fidgety/restless 0 0 -  Suicidal thoughts 0 0 -  PHQ-9 Score 0 0 -  Difficult doing work/chores Not difficult at all Not difficult at all -      Social History   Tobacco Use  Smoking Status Former   Packs/day: 2.00   Years: 20.00   Pack years: 40.00   Types: Cigarettes   Quit date: 08/31/1981   Years since quitting: 39.4  Smokeless Tobacco Never   BP Readings from Last 3 Encounters:  12/09/20 130/70  09/10/20 122/68  08/08/20 124/62   Pulse Readings from Last 3 Encounters:  12/09/20 68  09/10/20 65  08/08/20 74   Wt Readings from Last 3 Encounters:  12/09/20 220 lb 12.8 oz (100.2 kg)  09/10/20 223 lb (101.2 kg)  08/08/20 227 lb 12.8 oz (103.3 kg)   BMI Readings from Last 3 Encounters:  12/09/20 29.95 kg/m  09/10/20 30.24 kg/m  08/08/20 30.90 kg/m    Assessment/Interventions: Review of patient past medical history, allergies, medications, health status, including review of consultants reports, laboratory and other test data, was performed as part of comprehensive evaluation and provision of chronic care management services.   SDOH:  (Social Determinants of Health) assessments  and interventions performed: No   CCM Care Plan  Allergies  Allergen Reactions   Lisinopril Cough   Erythromycin     REACTION: abd pain   Trulicity [Dulaglutide] Nausea Only    Medications Reviewed Today     Reviewed by Rebecca Eaton, [place] (Certified Medical Assistant) on 12/09/20 at 0751  Med List Status: <None>   Medication Order Taking? Sig Documenting Provider Last Dose Status Informant  aspirin 81 MG tablet [id number] Yes Take 1 tablet by mouth at bedtime.  [provider] Taking Active Self  empagliflozin (JARDIANCE) 10 MG TABS tablet [id number] Yes Take 1 tablet (10 mg total) by mouth daily. Burchette,  Alinda Sierras, MD Taking Active   glimepiride (AMARYL) 4 MG tablet [id number] Yes Take 1 tablet by mouth once daily with breakfast Burchette, Alinda Sierras, MD Taking Active   glucose blood (ONETOUCH ULTRA) test strip [id number] Yes Use one strip twice daily to check blood sugars [name], MD Taking Active   Lancets (ONETOUCH DELICA PLUS NWGNFA21H) MISC [id number] Yes USE 1  TO CHECK GLUCOSE ONCE DAILY [name], MD Taking Active Self  Lancets (ONETOUCH DELICA PLUS GEXBMW41L) [place] [id number] Yes USE 1  TO CHECK GLUCOSE TWICE DAILY Burchette, Alinda Sierras, MD Taking Active   lisinopril (ZESTRIL) 10 MG tablet [id number] Yes Take 1 tablet (10 mg total) by mouth daily. [name], MD Taking Active   metFORMIN (GLUCOPHAGE) 500 MG tablet [id number] Yes Take 2 tablets (1,000 mg total) by mouth 2 (two) times daily with a meal. Burchette, Alinda Sierras, MD Taking Active   nitroGLYCERIN (NITROSTAT) 0.4 MG SL tablet [id number] Yes Take one tablet sublingual as needed for chest pain up to three doses as needed.  Patient taking differently: Place 0.4 mg under the tongue every 5 (five) minutes as needed for chest pain.   [name], MD Taking Active   pioglitazone (ACTOS) 45 MG tablet [id number] Yes Take 1 tablet (45 mg total) by mouth daily. [name], MD Taking Active    rosuvastatin (CRESTOR) 20 MG tablet [id number] Yes Take 1 tablet (20 mg total) by mouth daily. Jettie Booze, MD Taking Active   Semaglutide,0.25 or 0.5MG/DOS, (OZEMPIC, 0.25 OR 0.5 MG/DOSE,) 2 MG/1.5ML SOPN [id number] Yes Start 0.25 mg [place] once weekly for 4 weeks and then increase to 0.5 mg [employer] weekly. [name], MD Taking Active   tadalafil (CIALIS) 20 MG tablet [id number] Yes Take one every other day as needed for erectile dysfunction. [name], MD Taking Active   vitamin B-12 (CYANOCOBALAMIN) 1000 MCG tablet [id number] Yes Take 1,000 mcg by mouth at bedtime.  [provider] Taking Active Self            Patient Active Problem List   Diagnosis Date Noted   Erectile dysfunction 03/11/2020   CKD (chronic kidney disease) stage 3, GFR 30-59 ml/min (HCC) 11/06/2019   Laryngopharyngeal reflux (LPR) 06/12/2019   Perennial allergic rhinitis 06/12/2019   Chronic right shoulder pain 09/24/2016   Cough 08/19/2016   Hemoptysis 08/19/2016   Normocytic anemia 06/29/2016   Hypertension 12/25/2015   Obesity (BMI 30-39.9) 02/02/2014   Acute URI 10/10/2012   Overweight 04/05/2012   Venous insufficiency 04/02/2011   HEADACHE 02/25/2008   CHEST PAIN, ATYPICAL 02/25/2008   HYPERCHOLESTEROLEMIA 02/24/2008   Poorly controlled type 2 diabetes mellitus ([place]) 02/23/2008   ANXIETY 02/23/2008   GERD 02/23/2008   PSORIASIS 02/23/2008   DEGENERATIVE JOINT DISEASE 02/23/2008   COLONIC POLYPS, HX OF 02/23/2008    Immunization History  Administered Date(s) Administered   Fluad Quad(high Dose 65+) 05/09/2019, 06/11/2020   Influenza Split 10/05/2011   Influenza Whole 07/16/2009   Influenza, High Dose Seasonal PF 05/22/2015, 06/29/2016, 06/15/2017, 05/20/2018   Influenza,inj,Quad PF,6+ Mos 07/07/2013, 05/04/2014   PFIZER(Purple Top)SARS-COV-2 Vaccination 10/22/2019, 11/15/2019, 07/09/2020   Pneumococcal Conjugate-13 08/21/2014   Pneumococcal Polysaccharide-23 12/05/2012    Pneumococcal-Unspecified 10/11/2007   Td 03/09/2006   Tdap 03/25/2016   Zoster, Live 03/31/2016   Patient reported he has had congestion for 3 weeks now. He got a COVID test and tested negative but continues to have congestion. He has been taking Zyrtec to help  with allergies and was taking Mucinex-DM to help with congestion. Educated on avoiding Mucinex-DM as the mechanism of the 2 medications are working against each other. Recommended setting up an appointment with his PCP to discuss.  Conditions to be addressed/monitored:  Hypertension, Hyperlipidemia, Diabetes, Chronic Kidney Disease and ED  Care Plan : [place]  Updates made by Viona Gilmore, [place] since 02/06/2021 12:00 AM     Problem: Problem: Hypertension, Hyperlipidemia, Diabetes, Chronic Kidney Disease and ED      Long-Range Goal: Patient-Specific Goal   Start Date: 11/20/2020  Expected End Date: 11/20/2021  Recent Progress: On track  Priority: High  Note:   Current Barriers:  Unable to independently monitor therapeutic efficacy Unable to achieve control of diabetes   Pharmacist Clinical Goal(s):  Patient will achieve adherence to monitoring guidelines and medication adherence to achieve therapeutic efficacy achieve control of diabetes as evidenced by A1c  through collaboration with PharmD and provider.   Interventions: 1:1 collaboration with [name], MD regarding development and update of comprehensive plan of care as evidenced by provider attestation and co-signature Inter-disciplinary care team collaboration (see longitudinal plan of care) Comprehensive medication review performed; medication list updated in electronic medical record  Hypertension (BP goal <140/90) -Controlled -Current treatment: Lisinopril 69m, 1 tablet once daily -Medications previously tried: isosorbide  -Current home readings: not checking regularly -Current dietary habits: did not discuss -Current exercise  habits: did not discuss -Denies hypotensive/hypertensive symptoms -Educated on Exercise goal of 150 minutes per week; Importance of home blood pressure monitoring; -Counseled to monitor BP at home weekly, document, and provide log at future appointments -Counseled on diet and exercise extensively Recommended to continue current medication  Hyperlipidemia: (LDL goal < 70) -Controlled -Current treatment: Rosuvastatin 257m 1 tablet once daily -Medications previously tried:  rosuvastatin (elevated BGs), simvastatin (myalgias)  -Current dietary patterns: did not discuss -Current exercise habits: did not discuss -Educated on Cholesterol goals;  Importance of limiting foods high in cholesterol; Exercise goal of 150 minutes per week; -Recommended to continue current medication  Diabetes (A1c goal <7%) -Uncontrolled -Current medications: Glimepiride 69m35m1 tablet once daily with breakfast Empagliflozin (Jardiance) 109m21m tablet once daily Metformin 500mg16mtablets twice daily Pioglitazone (Actos) 45mg,76mablet once daily Ozempic, inject 0.5 mg once weekly -Medications previously tried: Onglyza (cost), Januvia (switch to GLP1), Trulicity (nausea)  -Current home glucose readings fasting glucose: 68, 69, 69, 70, 109, 111, 95, 92, 89, 78, 72, 98, 73, 65, 114, 113, 106, 121, 101, 158 the next morning, 98 post prandial glucose: higher in the afternoon -Denies hypoglycemic/hyperglycemic symptoms -Current meal patterns:  breakfast: was having jimmy dean sausage biscuits, now having a blueberry muffin  lunch: n/a  dinner: chicken pot pie, chicken tenders and salad snacks: used to eat little debbies and honeybuns and cut them out; eating peanut butter crackers, cream cheese and chives, and desserts only every once in a while drinks: n/a -Current exercise: did not discuss -Educated on Exercise goal of 150 minutes per week; Benefits of routine self-monitoring of blood sugar; Carbohydrate  counting and/or plate method -Counseled to check feet daily and get yearly eye exams -Counseled on diet and exercise extensively Recommended to continue current medication Recommended eating protein following treatment of low blood sugars to prevent spiking  CKD (Goal: improve kidney function) -Controlled -Current treatment  Jardiance 10 mg 1 tablet daily -Medications previously tried: none  -Recommended to continue current medication  Primary prevention of ASCVD (Goal: prevent heart attacks  and strokes) -Controlled -Current treatment  Aspirin 81 mg 1 tablet daily -Medications previously tried: none  -Counseled on monitoring for bleeding and bruising  ED (Goal: minimize symptoms) -Controlled -Current treatment  Cialis 5 mg 1 tablet as needed -Medications previously tried: sildenafil (no longer effective)  -Recommended to continue current medication   Health Maintenance -Vaccine gaps: shingrix -Current therapy:  Vitamin B12 1000 mcg 1 tablet daily -Educated on Cost vs benefit of each product must be carefully weighed by individual consumer -Patient is satisfied with current therapy and denies issues -Recommended to continue current medication  Patient Goals/Self-Care Activities Patient will:  - take medications as prescribed check glucose daily, document, and provide at future appointments check blood pressure weekly, document, and provide at future appointments target a minimum of 150 minutes of moderate intensity exercise weekly engage in dietary modifications by eating more non-starchy vegetables than carbs  Follow Up Plan: Telephone follow up appointment with care management team member scheduled for: 3 months       Medication Assistance:  Ozempic obtained through [employer] medication assistance program.  Enrollment ends 08/30/21    Compliance/Adherence/Medication fill history: Care Gaps: Shingrix, COVID booster  Star-Rating Drugs: Metformin 500 mg - last  filled 01/27/21 for 30 ds at [facility] Glimepiride 4 mg - last filled 01/27/21 for 30 ds at [facility] Pioglitazone 45 mg - last filled 01/27/21 for 30 ds at [facility] Ozempic 0.5 mg - PAP Jardiance 10 mg - last filled 01/27/21 for 30 ds at [facility] Lisinopril 10 mg - last filled 01/27/21 for 30 ds at [facility] Rosuvastatin 20 mg - last filled 01/27/21 for 30 ds at [facility]  Patient's preferred pharmacy is:  [facility] - [place], [place] - [address] [place] [address] Phone: [phone] Fax: [phone]  Uses pill box? No - adherence packaging Pt endorses 100% compliance  We discussed: Benefits of medication synchronization, packaging and delivery as well as enhanced pharmacist oversight with [facility]. Patient decided to: Utilize [facility] pharmacy for medication synchronization, packaging and delivery  Care Plan and Follow Up Patient Decision:  Patient agrees to Care Plan and Follow-up.  Plan: Telephone follow up appointment with care management team member scheduled for:  3 months  [name], PharmD [place] Pharmacist [place] at [place] [phone]

## 2021-02-06 NOTE — Patient Instructions (Signed)
Hi Vasilis,  It was great catching up! I hope you feel better soon. Below is a summary of some of the topics we discussed.  Don't forget to check some after meal blood sugars as we discussed and reach out to me if you start to have blood sugars < 70 or have side effects with Ozempic.  Please reach out to me if you have any questions or need anything before our follow up!  Best, Maddie  [name], PharmD, [place] at [place]   Visit Information   Goals Addressed   None    Patient Care Plan: CCM Pharmacy Care Plan     Problem Identified: Problem: Hypertension, Hyperlipidemia, Diabetes, Chronic Kidney Disease and ED      Long-Range Goal: Patient-Specific Goal   Start Date: 11/20/2020  Expected End Date: 11/20/2021  Recent Progress: On track  Priority: High  Note:   Current Barriers:  Unable to independently monitor therapeutic efficacy Unable to achieve control of diabetes   Pharmacist Clinical Goal(s):  Patient will achieve adherence to monitoring guidelines and medication adherence to achieve therapeutic efficacy achieve control of diabetes as evidenced by A1c  through collaboration with PharmD and provider.   Interventions: 1:1 collaboration with [name], MD regarding development and update of comprehensive plan of care as evidenced by provider attestation and co-signature Inter-disciplinary care team collaboration (see longitudinal plan of care) Comprehensive medication review performed; medication list updated in electronic medical record  Hypertension (BP goal <140/90) -Controlled -Current treatment: Lisinopril 10mg , 1 tablet once daily -Medications previously tried: isosorbide  -Current home readings: not checking regularly -Current dietary habits: did not discuss -Current exercise habits: did not discuss -Denies hypotensive/hypertensive symptoms -Educated on Exercise goal of 150 minutes per  week; Importance of home blood pressure monitoring; -Counseled to monitor BP at home weekly, document, and provide log at future appointments -Counseled on diet and exercise extensively Recommended to continue current medication  Hyperlipidemia: (LDL goal < 70) -Controlled -Current treatment: Rosuvastatin 20mg , 1 tablet once daily -Medications previously tried:  rosuvastatin (elevated BGs), simvastatin (myalgias)  -Current dietary patterns: did not discuss -Current exercise habits: did not discuss -Educated on Cholesterol goals;  Importance of limiting foods high in cholesterol; Exercise goal of 150 minutes per week; -Recommended to continue current medication  Diabetes (A1c goal <7%) -Uncontrolled -Current medications: Glimepiride 4mg , 1 tablet once daily with breakfast Empagliflozin (Jardiance) 10mg , 1 tablet once daily Metformin 500mg , 1 tablets twice daily Pioglitazone (Actos) 45mg , 1 tablet once daily Ozempic, inject 0.5 mg once weekly -Medications previously tried: Onglyza (cost), Januvia (switch to GLP1), Trulicity (nausea)  -Current home glucose readings fasting glucose: 68, 69, 69, 70, 109, 111, 95, 92, 89, 78, 72, 98, 73, 65, 114, 113, 106, 121, 101, 158 the next morning, 98 post prandial glucose: higher in the afternoon -Denies hypoglycemic/hyperglycemic symptoms -Current meal patterns:  breakfast: was having jimmy dean sausage biscuits, now having a blueberry muffin  lunch: n/a  dinner: chicken pot pie, chicken tenders and salad snacks: used to eat little debbies and honeybuns and cut them out; eating peanut butter crackers, cream cheese and chives, and desserts only every once in a while drinks: n/a -Current exercise: did not discuss -Educated on Exercise goal of 150 minutes per week; Benefits of routine self-monitoring of blood sugar; Carbohydrate counting and/or plate method -Counseled to check feet daily and get yearly eye exams -Counseled on diet and  exercise extensively Recommended to continue current medication Recommended eating protein following  treatment of low blood sugars to prevent spiking  CKD (Goal: improve kidney function) -Controlled -Current treatment  Jardiance 10 mg 1 tablet daily -Medications previously tried: none  -Recommended to continue current medication  Primary prevention of ASCVD (Goal: prevent heart attacks and strokes) -Controlled -Current treatment  Aspirin 81 mg 1 tablet daily -Medications previously tried: none  -Counseled on monitoring for bleeding and bruising  ED (Goal: minimize symptoms) -Controlled -Current treatment  Cialis 5 mg 1 tablet as needed -Medications previously tried: sildenafil (no longer effective)  -Recommended to continue current medication   Health Maintenance -Vaccine gaps: shingrix -Current therapy:  Vitamin B12 1000 mcg 1 tablet daily -Educated on Cost vs benefit of each product must be carefully weighed by individual consumer -Patient is satisfied with current therapy and denies issues -Recommended to continue current medication  Patient Goals/Self-Care Activities Patient will:  - take medications as prescribed check glucose daily, document, and provide at future appointments check blood pressure weekly, document, and provide at future appointments target a minimum of 150 minutes of moderate intensity exercise weekly engage in dietary modifications by eating more non-starchy vegetables than carbs  Follow Up Plan: Telephone follow up appointment with care management team member scheduled for: 3 months       Patient verbalizes understanding of instructions provided today and agrees to view in [place].  Telephone follow up appointment with pharmacy team member scheduled for: 3 months  Viona Gilmore, [facility]

## 2021-02-09 NOTE — Chronic Care Management (AMB) (Signed)
Date- Patient called to remind of appointment with Watt Climes on 02/06/2021 at 4:00 pm   Patient aware of appointment date, time, and type of appointment  telephone). Patient aware to have/bring all medications, supplements, blood pressure and/or blood sugar logs to visit.  Questions: Have you had any recent office visit or specialist visit outside of [facility]? No  Are there any concerns you would like to discuss during your office visit? No  Are you having any problems obtaining your medications?  None  If patient has any PAP medications ask if they are having any problems getting their PAP medication or refill? No  Star Rating Drug: Medication Dispensed Quantity Pharmacy  Metformin 500 mg 05.30.2022 [facility]  Rosuvastatin 20 mg 05.30.2022 30 [facility]  Lisinopril 10 mg 05.30.2022 30 [facility]    Any gaps in medications fill history?  None    Maia Breslow, [place] Pharmacist Assistant [phone]

## 2021-02-14 ENCOUNTER — Telehealth (INDEPENDENT_AMBULATORY_CARE_PROVIDER_SITE_OTHER): Payer: Medicare Other | Admitting: Family Medicine

## 2021-02-14 ENCOUNTER — Other Ambulatory Visit: Payer: Self-pay

## 2021-02-14 DIAGNOSIS — J310 Chronic rhinitis: Secondary | ICD-10-CM

## 2021-02-14 MED ORDER — MONTELUKAST SODIUM 10 MG PO TABS: 10.0000 mg | ORAL_TABLET | Freq: Every day | ORAL | 3 refills | Status: DC

## 2021-02-14 NOTE — Progress Notes (Signed)
Patient ID: Lucas Warren, male   DOB: [birth year], 75 y.o.   MRN: [id number]  This visit type was conducted due to national recommendations for restrictions regarding the COVID-19 pandemic in an effort to limit this patient's exposure and mitigate transmission in our community.   Virtual Visit via Video Note  I connected with Sherian Rein on 02/14/21 at 10:45 AM EDT by a video enabled telemedicine application and verified that I am speaking with the correct person using two identifiers.  Location patient: home Location provider:work or home office Persons participating in the virtual visit: patient, provider  I discussed the limitations of evaluation and management by telemedicine and the availability of in person appointments. The patient expressed understanding and agreed to proceed.   HPI:  JANSEL VONSTEIN presents for significant nasal congestion now for the past few weeks.  He states he has fairly high-volume nasal secretions.  These are mostly clear and mostly thin.  He tried over-the-counter Mucinex and some type of over-the-counter antihistamine without much change.  COVID testing was negative.  No fever.  No facial pain.  No headaches.  No purulent secretions.  No bloody secretions.  Patient does have history of some perennial allergies and may have some vasomotor rhinitis as well.  He states that he frequently has rhinorrhea when he starts to eat almost any food.  He has taken nasal steroids in the past but has frequently had nosebleeds both with Nasacort and Flonase. He just got some Zyrtec and plans to start that soon.  No pets.  Symptoms can occur indoors and outdoors.  No clear triggers-other than sometimes with eating as above.  Has type 2 diabetes.  Recently got coverage for Ozempic and blood sugars are improving.  He has already lost about 8 pounds.  Blood sugars improving and he has follow-up in about a month to reassess A1c.  ROS: See pertinent positives and negatives per  HPI.  Past Medical History:  Diagnosis Date   Allergy    seasonal   Anxiety    Chest pain, atypical    Colon polyps    hyperplastic 2003   DJD (degenerative joint disease)    DM (diabetes mellitus) ([place])    GERD (gastroesophageal reflux disease)    History of headache    Hypercholesteremia    Psoriasis     Past Surgical History:  Procedure Laterality Date   CATARACT EXTRACTION, BILATERAL Bilateral    Dr. Tommy Rainwater;    [place] CATH AND CORONARY ANGIOGRAPHY N/A 05/20/2020   Procedure: LEFT HEART CATH AND CORONARY ANGIOGRAPHY;  Surgeon: Jettie Booze, MD;  Location: [facility];  Service: Cardiovascular;  Laterality: N/A;   NASAL SEPTUM SURGERY  1989    Family History  Problem Relation Age of Onset   Lung cancer Mother    Alzheimer's disease Father    Heart disease Father    Leukemia Sister     SOCIAL HX: Non-smoker   Current Outpatient Medications:    aspirin 81 MG tablet, Take 1 tablet by mouth at bedtime. , Disp: , Rfl:    empagliflozin (JARDIANCE) 10 MG TABS tablet, Take 1 tablet (10 mg total) by mouth daily., Disp: 7 tablet, Rfl: 0   glimepiride (AMARYL) 4 MG tablet, Take 1 tablet by mouth once daily with breakfast, Disp: 90 tablet, Rfl: 3   glucose blood (ONETOUCH ULTRA) test strip, Use one strip twice daily to check blood sugars, Disp: 180 each, Rfl:  3   Lancets (ONETOUCH DELICA PLUS [id number]) MISC, USE 1  TO CHECK GLUCOSE ONCE DAILY, Disp: 100 each, Rfl: 4   Lancets (ONETOUCH DELICA PLUS [id number]) MISC, USE 1  TO CHECK GLUCOSE TWICE DAILY, Disp: 180 each, Rfl: 3   lisinopril (ZESTRIL) 10 MG tablet, Take 1 tablet (10 mg total) by mouth daily., Disp: 90 tablet, Rfl: 3   metFORMIN (GLUCOPHAGE) 500 MG tablet, Take 2 tablets (1,000 mg total) by mouth 2 (two) times daily with a meal., Disp: 180 tablet, Rfl: 3   montelukast (SINGULAIR) 10 MG tablet, Take 1 tablet (10 mg total) by mouth at bedtime., Disp: 30 tablet, Rfl: 3    nitroGLYCERIN (NITROSTAT) 0.4 MG SL tablet, Take one tablet sublingual as needed for chest pain up to three doses as needed. (Patient taking differently: Place 0.4 mg under the tongue every 5 (five) minutes as needed for chest pain.), Disp: 20 tablet, Rfl: 0   pioglitazone (ACTOS) 45 MG tablet, Take 1 tablet (45 mg total) by mouth daily., Disp: 90 tablet, Rfl: 3   rosuvastatin (CRESTOR) 20 MG tablet, Take 1 tablet (20 mg total) by mouth daily., Disp: 90 tablet, Rfl: 3   Semaglutide,0.25 or 0.5MG /DOS, (OZEMPIC, 0.25 OR 0.5 MG/DOSE,) 2 MG/1.5ML SOPN, Start 0.25 mg [place] once weekly for 4 weeks and then increase to 0.5 mg [place] weekly., Disp: 1.5 mL, Rfl: 11   tadalafil (CIALIS) 20 MG tablet, Take one every other day as needed for erectile dysfunction., Disp: 30 tablet, Rfl: 3   vitamin B-12 (CYANOCOBALAMIN) 1000 MCG tablet, Take 1,000 mcg by mouth at bedtime. , Disp: , Rfl:   EXAM:  VITALS per patient if applicable:  GENERAL: alert, oriented, appears well and in no acute distress  HEENT: atraumatic, conjunttiva clear, no obvious abnormalities on inspection of external nose and ears  NECK: normal movements of the head and neck  LUNGS: on inspection no signs of respiratory distress, breathing rate appears normal, no obvious gross SOB, gasping or wheezing  CV: no obvious cyanosis  MS: moves all visible extremities without noticeable abnormality  PSYCH/NEURO: pleasant and cooperative, no obvious depression or anxiety, speech and thought processing grossly intact  ASSESSMENT AND PLAN:  Discussed the following assessment and plan:  Rhinitis.  Suspect he may have components of allergic and nonallergic rhinitis.  Previous issues with nosebleeds both with Nasacort and Flonase.  -Continue Zyrtec 1 daily -Consider trial of Singulair 10 mg nightly -Avoid steroids with his diabetes history -Reviewed signs and symptoms of bacterial infection to be on the watch out for -He has follow-up in 1 month for  his diabetes follow-up and will reassess at that point.     I discussed the assessment and treatment plan with the patient. The patient was provided an opportunity to ask questions and all were answered. The patient agreed with the plan and demonstrated an understanding of the instructions.   The patient was advised to call back or seek an in-person evaluation if the symptoms worsen or if the condition fails to improve as anticipated.     [name], MD

## 2021-02-17 ENCOUNTER — Other Ambulatory Visit: Payer: Self-pay | Admitting: Family Medicine

## 2021-02-19 ENCOUNTER — Telehealth: Payer: Self-pay | Admitting: Pharmacist

## 2021-02-19 NOTE — Chronic Care Management (AMB) (Signed)
Chronic Care Management Pharmacy Assistant   Name: Lucas Warren  MRN: [id number] DOB: [birth year]  Reason for Encounter: Medication Review-Medication Coordination Call   Recent office visits:  06.17.2022 [name], MD video visit patient was seen for nasal congestion. Medication prescribed montelukast (SINGULAIR) 10 mg one tablet at bedtime.  Recent consult visits:  None  Hospital visits:  None in previous 6 months  Medications: Outpatient Encounter Medications as of 02/19/2021  Medication Sig   aspirin 81 MG tablet Take 1 tablet by mouth at bedtime.    empagliflozin (JARDIANCE) 10 MG TABS tablet Take 1 tablet (10 mg total) by mouth daily.   glimepiride (AMARYL) 4 MG tablet TAKE ONE TABLET BY MOUTH EVERY MORNING   glucose blood (ONETOUCH ULTRA) test strip Use one strip twice daily to check blood sugars   Lancets (ONETOUCH DELICA PLUS [id number]) MISC USE 1  TO CHECK GLUCOSE ONCE DAILY   Lancets (ONETOUCH DELICA PLUS [id number]) MISC USE 1  TO CHECK GLUCOSE TWICE DAILY   lisinopril (ZESTRIL) 10 MG tablet Take 1 tablet (10 mg total) by mouth daily.   metFORMIN (GLUCOPHAGE) 500 MG tablet Take 2 tablets (1,000 mg total) by mouth 2 (two) times daily with a meal.   montelukast (SINGULAIR) 10 MG tablet Take 1 tablet (10 mg total) by mouth at bedtime.   nitroGLYCERIN (NITROSTAT) 0.4 MG SL tablet Take one tablet sublingual as needed for chest pain up to three doses as needed. (Patient taking differently: Place 0.4 mg under the tongue every 5 (five) minutes as needed for chest pain.)   pioglitazone (ACTOS) 45 MG tablet TAKE ONE TABLET BY MOUTH EVERY MORNING   rosuvastatin (CRESTOR) 20 MG tablet Take 1 tablet (20 mg total) by mouth daily.   Semaglutide,0.25 or 0.5MG /DOS, (OZEMPIC, 0.25 OR 0.5 MG/DOSE,) 2 MG/1.5ML SOPN Start 0.25 mg [place] once weekly for 4 weeks and then increase to 0.5 mg [place] weekly.   tadalafil (CIALIS) 20 MG tablet Take one every other day as needed for erectile  dysfunction.   vitamin B-12 (CYANOCOBALAMIN) 1000 MCG tablet Take 1,000 mcg by mouth at bedtime.    No facility-administered encounter medications on file as of 02/19/2021.   Reviewed chart for medication changes ahead of medication coordination call.  BP Readings from Last 3 Encounters:  12/09/20 130/70  09/10/20 122/68  08/08/20 124/62    Lab Results  Component Value Date   HGBA1C 7.9 (A) 12/09/2020    Patient obtains medications through Adherence Packaging  30 Days   Last adherence delivery included:  Jardiance 10 mg: one tablet at breakfast Lisinopril 10 mg: one tablet at dinner Rosuvastatin 20 mg: one tablet at bedtime Metformin 500 mg: two tablets at breakfast and two at dinner Pioglitazone 45 mg: one tablet at breakfast Glimepiride 4 mg: one tablet at breakfast  Patient declined the following medication last month due to PRN use/additional supply on hand. Nitroglycerin (NITROSTAT) 0.4 MG SL tablet Tadalafil 20 MG tablet PRN Vitamin B-12 (CYANOCOBALAMIN) 1000 MCG tablet  Patient is due for next adherence delivery on: 02/27/2021. Called patient and reviewed medications and coordinated delivery. This delivery to include: Jardiance 10 mg: one tablet at breakfast Lisinopril 10 mg: one tablet at dinner Rosuvastatin 20 mg: one tablet at bedtime Metformin 500 mg: two tablets at breakfast and two at dinner Pioglitazone 45 mg: one tablet at breakfast Glimepiride 4 mg: one tablet at breakfast  Patient will need a short fill of Montelukast 10 mg tablets for 13 days, prior  to next adherence delivery. (To align with sync date )  Coordinated acute fill for Montelukast 10 mg tablets to be delivered 07.18.2022.  Patient declined the following medications due to PRN use. Nitroglycerin (NITROSTAT) 0.4 MG SL tablet Tadalafil 20 MG tablet PRN Vitamin B-12 (CYANOCOBALAMIN) 1000 MCG tablet Montelukast 10 mg: one tablet at bedtime ( last filled on 06.17.2022)   He currently does not  need refills  Confirmed delivery date of 02/27/2021, advised patient that pharmacy will contact them the morning of delivery.  Star Rating Drugs: Medication Dispensed  Quantity Pharmacy  Jardiance 10 mg  05.30.2022 30 [facility]  Rosuvastatin 20 mg  05.30.2022 30 [facility]  Lisinopril 10 mg 05.30.2022 30 [facility]  Pioglitazone 45 mg 05.30.2022 [facility]  Metformin 500 mg 05.30.2022 30 [facility]  Ozempic 0.5 mg      Maia Breslow, [place] Pharmacist Assistant [phone]

## 2021-03-10 ENCOUNTER — Other Ambulatory Visit: Payer: Self-pay

## 2021-03-10 ENCOUNTER — Ambulatory Visit (INDEPENDENT_AMBULATORY_CARE_PROVIDER_SITE_OTHER): Payer: Medicare Other | Admitting: Family Medicine

## 2021-03-10 ENCOUNTER — Encounter: Payer: Self-pay | Admitting: Family Medicine

## 2021-03-10 VITALS — BP 118/60 | HR 65 | Temp 97.6°F | Wt 217.1 lb

## 2021-03-10 DIAGNOSIS — E1165 Type 2 diabetes mellitus with hyperglycemia: Secondary | ICD-10-CM | POA: Diagnosis not present

## 2021-03-10 LAB — POCT GLYCOSYLATED HEMOGLOBIN (HGB A1C): Hemoglobin A1C: 7.7 % — AB (ref 4.0–5.6)

## 2021-03-10 NOTE — Patient Instructions (Signed)
A1C has improved slightly to 7.7%  Let's plan on 3 month follow up   Continue with weight loss efforts.

## 2021-03-10 NOTE — Progress Notes (Signed)
Established Patient Office Visit  Subjective:  Patient ID: Lucas Warren, male    DOB: 10/03/1945  Age: 75 y.o. MRN: [id number]  CC:  Chief Complaint  Patient presents with   Follow-up    Diabetes    HPI Lucas Warren presents for follow-up regarding type 2 diabetes.  He takes multiple medications for this and had recent addition of Ozempic.  He had previously had nausea with Trulicity but has been able to tolerate Ozempic and currently on 0.5 mg once weekly.  He also takes metformin, Actos, glimepiride, and Jardiance.  Occasional rare hypoglycemic symptoms.  No significant polyuria or polydipsia.  Weight is down about 3 pounds from last visit.  Overall, he is pleased with the Ozempic.  He states his fasting blood sugars are frequently less than 100 but still having some 2-hour postprandial sugars especially in the afternoon around 200  Past Medical History:  Diagnosis Date   Allergy    seasonal   Anxiety    Chest pain, atypical    Colon polyps    hyperplastic 2003   DJD (degenerative joint disease)    DM (diabetes mellitus) ([place])    GERD (gastroesophageal reflux disease)    History of headache    Hypercholesteremia    Psoriasis     Past Surgical History:  Procedure Laterality Date   CATARACT EXTRACTION, BILATERAL Bilateral    Dr. Tommy Rainwater;    [place] CATH AND CORONARY ANGIOGRAPHY N/A 05/20/2020   Procedure: LEFT HEART CATH AND CORONARY ANGIOGRAPHY;  Surgeon: Jettie Booze, MD;  Location: [facility];  Service: Cardiovascular;  Laterality: N/A;   NASAL SEPTUM SURGERY  1989    Family History  Problem Relation Age of Onset   Lung cancer Mother    Alzheimer's disease Father    Heart disease Father    Leukemia Sister     Social History   Socioeconomic History   Marital status: Divorced    Spouse name: carolyn   Number of children: Not on file   Years of education: Not on file   Highest education level: Not on file   Occupational History    Comment: Patent examiner  Tobacco Use   Smoking status: Former    Packs/day: 2.00    Years: 20.00    Pack years: 40.00    Types: Cigarettes    Quit date: 08/31/1981    Years since quitting: 39.5   Smokeless tobacco: Never  Vaping Use   Vaping Use: Never used  Substance and Sexual Activity   Alcohol use: Yes    Comment: rarely a beer   Drug use: No   Sexual activity: Not on file  Other Topics Concern   Not on file  Social History Narrative   Not on file   Social Determinants of Health   Financial Resource Strain: Low Risk    Difficulty of Paying Living Expenses: Not hard at all  Food Insecurity: No Food Insecurity   Worried About Charity fundraiser in the Last Year: Never true   Finger in the Last Year: Never true  Transportation Needs: No Transportation Needs   Lack of Transportation (Medical): No   Lack of Transportation (Non-Medical): No  Physical Activity: Inactive   Days of Exercise per Week: 0 days   Minutes of Exercise per Session: 0 min  Stress: No Stress Concern Present   Feeling of Stress : Not at  all  Social Connections: Moderately Integrated   Frequency of Communication with Friends and Family: More than three times a week   Frequency of Social Gatherings with Friends and Family: Three times a week   Attends Religious Services: More than 4 times per year   Active Member of Clubs or Organizations: Yes   Attends Music therapist: More than 4 times per year   Marital Status: Divorced  Human resources officer Violence: Not At Risk   Fear of Current or Ex-Partner: No   Emotionally Abused: No   Physically Abused: No   Sexually Abused: No    Outpatient Medications Prior to Visit  Medication Sig Dispense Refill   aspirin 81 MG tablet Take 1 tablet by mouth at bedtime.      empagliflozin (JARDIANCE) 10 MG TABS tablet Take 1 tablet (10 mg total) by mouth daily. 7 tablet 0   glimepiride (AMARYL) 4 MG tablet TAKE  ONE TABLET BY MOUTH EVERY MORNING 90 tablet 3   glucose blood (ONETOUCH ULTRA) test strip Use one strip twice daily to check blood sugars 180 each 3   Lancets (ONETOUCH DELICA PLUS [id number]) MISC USE 1  TO CHECK GLUCOSE ONCE DAILY 100 each 4   Lancets (ONETOUCH DELICA PLUS [id number]) MISC USE 1  TO CHECK GLUCOSE TWICE DAILY 180 each 3   lisinopril (ZESTRIL) 10 MG tablet Take 1 tablet (10 mg total) by mouth daily. 90 tablet 3   metFORMIN (GLUCOPHAGE) 500 MG tablet Take 2 tablets (1,000 mg total) by mouth 2 (two) times daily with a meal. 180 tablet 3   montelukast (SINGULAIR) 10 MG tablet Take 1 tablet (10 mg total) by mouth at bedtime. 30 tablet 3   nitroGLYCERIN (NITROSTAT) 0.4 MG SL tablet Take one tablet sublingual as needed for chest pain up to three doses as needed. (Patient taking differently: Place 0.4 mg under the tongue every 5 (five) minutes as needed for chest pain.) 20 tablet 0   pioglitazone (ACTOS) 45 MG tablet TAKE ONE TABLET BY MOUTH EVERY MORNING 90 tablet 3   rosuvastatin (CRESTOR) 20 MG tablet Take 1 tablet (20 mg total) by mouth daily. 90 tablet 3   Semaglutide,0.25 or 0.5MG /DOS, (OZEMPIC, 0.25 OR 0.5 MG/DOSE,) 2 MG/1.5ML SOPN Start 0.25 mg [place] once weekly for 4 weeks and then increase to 0.5 mg [place] weekly. 1.5 mL 11   tadalafil (CIALIS) 20 MG tablet Take one every other day as needed for erectile dysfunction. 30 tablet 3   vitamin B-12 (CYANOCOBALAMIN) 1000 MCG tablet Take 1,000 mcg by mouth at bedtime.      No facility-administered medications prior to visit.    Allergies  Allergen Reactions   Lisinopril Cough   Erythromycin     REACTION: abd pain   Trulicity [Dulaglutide] Nausea Only    ROS Review of Systems  Constitutional:  Negative for fatigue and unexpected weight change.  Eyes:  Negative for visual disturbance.  Respiratory:  Negative for cough, chest tightness and shortness of breath.   Cardiovascular:  Negative for chest pain, palpitations and leg swelling.   Endocrine: Negative for polydipsia and polyuria.  Neurological:  Negative for dizziness, syncope, weakness, light-headedness and headaches.     Objective:    Physical Exam Constitutional:      Appearance: He is well-developed.  HENT:     Right Ear: External ear normal.     Left Ear: External ear normal.  Eyes:     Pupils: Pupils are equal, round, and reactive to light.  Neck:     Thyroid: No thyromegaly.  Cardiovascular:     Rate and Rhythm: Normal rate and regular rhythm.  Pulmonary:     Effort: Pulmonary effort is normal. No respiratory distress.     Breath sounds: Normal breath sounds. No wheezing or rales.  Musculoskeletal:     Cervical back: Neck supple.  Neurological:     Mental Status: He is alert and oriented to person, place, and time.    BP 118/60 (BP Location: Left Arm, Patient Position: Sitting, Cuff Size: Normal)   Pulse 65   Temp 97.6 F (36.4 C) (Oral)   Wt 217 lb 1.6 oz (98.5 kg)   SpO2 94%   BMI 29.44 kg/m  Wt Readings from Last 3 Encounters:  03/10/21 217 lb 1.6 oz (98.5 kg)  12/09/20 220 lb 12.8 oz (100.2 kg)  09/10/20 223 lb (101.2 kg)     Health Maintenance Due  Topic Date Due   Zoster Vaccines- Shingrix (1 of 2) Never done   COVID-19 Vaccine (4 - Booster for Pfizer series) 11/06/2020    There are no preventive care reminders to display for this patient.  Lab Results  Component Value Date   TSH 0.86 07/24/2015   Lab Results  Component Value Date   WBC 8.3 05/15/2020   HGB 12.9 (L) 05/15/2020   HCT 38.6 05/15/2020   MCV 86 05/15/2020   PLT 227 05/15/2020   Lab Results  Component Value Date   NA 140 06/11/2020   K 4.3 06/11/2020   CO2 23 06/11/2020   GLUCOSE 95 06/11/2020   BUN 28 (H) 06/11/2020   CREATININE 1.62 (H) 06/11/2020   BILITOT 0.4 06/11/2020   ALKPHOS 51 08/07/2019   AST 12 06/11/2020   ALT 8 (L) 06/11/2020   PROT 6.7 06/11/2020   ALBUMIN 4.5 08/07/2019   CALCIUM 9.1 06/11/2020   GFR 42.45 (L) 08/15/2019    Lab Results  Component Value Date   CHOL 127 06/11/2020   Lab Results  Component Value Date   HDL 50 06/11/2020   Lab Results  Component Value Date   LDLCALC 57 06/11/2020   Lab Results  Component Value Date   TRIG 117 06/11/2020   Lab Results  Component Value Date   CHOLHDL 2.5 06/11/2020   Lab Results  Component Value Date   HGBA1C 7.7 (A) 03/10/2021      Assessment & Plan:   Type 2 diabetes with history of poor control.  This has improved slightly to 7.7%.  Somewhat surprised this has not decreased further after addition of Ozempic.  This has been trending down from 8.3 to 7.9 to 7.7.  -We did discuss option of potentially increasing his Ozempic further but at this point he would like to give this 3 more months and try and establish more consistent exercise and continue weight loss and reassess.  We would eventually like to try to get him off glimepiride if possible.  We will plan full set of labs at follow-up with lipids and chemistries   No orders of the defined types were placed in this encounter.   Follow-up: Return in about 3 months (around 06/10/2021).    [name], MD

## 2021-04-04 ENCOUNTER — Other Ambulatory Visit: Payer: Self-pay | Admitting: Family Medicine

## 2021-04-23 ENCOUNTER — Telehealth: Payer: Self-pay | Admitting: Pharmacist

## 2021-04-23 NOTE — Chronic Care Management (AMB) (Signed)
Chronic Care Management Pharmacy Assistant   Name: Lucas Warren  MRN: [id number] DOB: [birth year]   Reason for Encounter: Medication Review Medication Coordination Call   Recent office visits:  03-10-21 [name], MD - Patient presented for Poorly controlled type 2 diabetes mellitus. No medication changes.  Recent consult visits:  None  Hospital visits:  None in previous 6 months  Medications: Outpatient Encounter Medications as of 04/23/2021  Medication Sig   aspirin 81 MG tablet Take 1 tablet by mouth at bedtime.    empagliflozin (JARDIANCE) 10 MG TABS tablet Take 1 tablet (10 mg total) by mouth daily.   glimepiride (AMARYL) 4 MG tablet TAKE ONE TABLET BY MOUTH EVERY MORNING   glucose blood (ONETOUCH ULTRA) test strip Use one strip twice daily to check blood sugars   Lancets (ONETOUCH DELICA PLUS 123XX123) MISC USE 1  TO CHECK GLUCOSE ONCE DAILY   Lancets (ONETOUCH DELICA PLUS 123XX123) MISC USE 1  TO CHECK GLUCOSE TWICE DAILY   lisinopril (ZESTRIL) 10 MG tablet Take 1 tablet (10 mg total) by mouth daily.   metFORMIN (GLUCOPHAGE) 500 MG tablet TAKE TWO TABLETS BY MOUTH TWICE DAILY WITH A meal   montelukast (SINGULAIR) 10 MG tablet Take 1 tablet (10 mg total) by mouth at bedtime.   nitroGLYCERIN (NITROSTAT) 0.4 MG SL tablet Take one tablet sublingual as needed for chest pain up to three doses as needed. (Patient taking differently: Place 0.4 mg under the tongue every 5 (five) minutes as needed for chest pain.)   pioglitazone (ACTOS) 45 MG tablet TAKE ONE TABLET BY MOUTH EVERY MORNING   rosuvastatin (CRESTOR) 20 MG tablet Take 1 tablet (20 mg total) by mouth daily.   Semaglutide,0.25 or 0.'5MG'$ /DOS, (OZEMPIC, 0.25 OR 0.5 MG/DOSE,) 2 MG/1.5ML SOPN Start 0.25 mg [place] once weekly for 4 weeks and then increase to 0.5 mg [place] weekly.   tadalafil (CIALIS) 20 MG tablet Take one every other day as needed for erectile dysfunction.   vitamin B-12 (CYANOCOBALAMIN) 1000 MCG tablet Take  1,000 mcg by mouth at bedtime.    No facility-administered encounter medications on file as of 04/23/2021.  Reviewed chart for medication changes ahead of medication coordination call.  No OVs, Consults, or hospital visits since last care coordination call/Pharmacist visit.  No medication changes indicated  BP Readings from Last 3 Encounters:  03/10/21 118/60  12/09/20 130/70  09/10/20 122/68    Lab Results  Component Value Date   HGBA1C 7.7 (A) 03/10/2021     Patient obtains medications through Adherence Packaging  30 Days   Last adherence delivery included: Jardiance 10 mg: one tablet at breakfast Lisinopril 10 mg: one tablet at dinner Rosuvastatin 20 mg: one tablet at bedtime Metformin 500 mg: two tablets at breakfast and two at dinner Pioglitazone 45 mg: one tablet at breakfast Glimepiride 4 mg: one tablet at breakfast    Patient is due for next adherence delivery on: 05-02-2021. Called patient and reviewed medications and coordinated delivery.  Confirmed Packaging for 30 DS This delivery to include: Metformin (Glucophage) 500 mg: two tablets at breakfast and two at dinner Pioglitazone (Actos) 45 mg: one tablet at breakfast Glimepiride (Amaryl) 4 mg: one tablet at breakfast Lisinopril (Zestril) 10 mg: one tablet at dinner Rosuvastatin 20 mg: one tablet at bedtime Empagliflozin (Jardiance) 10 mg: one tablet at breakfast Montelukast (Singulair) 10 mg : one tablet at bedtime   Patient needs refills for the following: Added to be included in delivery on pharmacy form  One Touch [place] Plus 33 G Lancets One touch Ultra test strips Tadalafil (Cialis) 20 mg take one every other day PRN  Confirmed delivery date of 05-02-2021, advised patient that pharmacy will contact him the morning of delivery.   Care Gaps: Zoster Vaccine - Overdue Covid Booster#4 Therapist, music) - Overdue Flu Vaccine - Overdue Eye Exam - Overdue AWV - Scheduled for 04-29-21 CCM F/U call - 05-13-21 1  pm   Star Rating Drugs: Jardiance 10 mg - Last filed 04-04-21 30 [facility] Rosuvastatin 20 mg - Last filled 04-04-21 30 [facility] Lisinopril 10 mg- Last filled 04-04-2021 30 [facility] Pioglitazone 45 mg - Last filled 04-04-21 30 [facility] Metformin 500 mg - Last filled 04-04-2021 30 [facility] at [facility] Ozempic 0.5 mg - not taking  Ned Clines [place] Clinical Pharmacist Assistant [phone]

## 2021-04-29 ENCOUNTER — Ambulatory Visit (INDEPENDENT_AMBULATORY_CARE_PROVIDER_SITE_OTHER): Payer: Medicare Other

## 2021-04-29 DIAGNOSIS — Z0000 Encounter for general adult medical examination without abnormal findings: Secondary | ICD-10-CM | POA: Diagnosis not present

## 2021-04-29 NOTE — Patient Instructions (Signed)
Mr. Lucas Warren , Thank you for taking time to come for your Medicare Wellness Visit. I appreciate your ongoing commitment to your health goals. Please review the following plan we discussed and let me know if I can assist you in the future.   Screening recommendations/referrals: Colonoscopy: 06/10/2012  due 2023 Recommended yearly ophthalmology/optometry visit for glaucoma screening and checkup Recommended yearly dental visit for hygiene and checkup  Vaccinations: Influenza vaccine: due fall 2022  Pneumococcal vaccine: completed series  Tdap vaccine: 03/25/2016 Shingles vaccine: will obtain local pharmacy     Advanced directives: will provide copies   Conditions/risks identified: none   Next appointment: none   Preventive Care 16 Years and Older, Male Preventive care refers to lifestyle choices and visits with your health care provider that can promote health and wellness. What does preventive care include? A yearly physical exam. This is also called an annual well check. Dental exams once or twice a year. Routine eye exams. Ask your health care provider how often you should have your eyes checked. Personal lifestyle choices, including: Daily care of your teeth and gums. Regular physical activity. Eating a healthy diet. Avoiding tobacco and drug use. Limiting alcohol use. Practicing safe sex. Taking low doses of aspirin every day. Taking vitamin and mineral supplements as recommended by your health care provider. What happens during an annual well check? The services and screenings done by your health care provider during your annual well check will depend on your age, overall health, lifestyle risk factors, and family history of disease. Counseling  Your health care provider may ask you questions about your: Alcohol use. Tobacco use. Drug use. Emotional well-being. Home and relationship well-being. Sexual activity. Eating habits. History of falls. Memory and ability to  understand (cognition). Work and work Statistician. Screening  You may have the following tests or measurements: Height, weight, and BMI. Blood pressure. Lipid and cholesterol levels. These may be checked every 5 years, or more frequently if you are over 63 years old. Skin check. Lung cancer screening. You may have this screening every year starting at age 14 if you have a 30-pack-year history of smoking and currently smoke or have quit within the past 15 years. Fecal occult blood test (FOBT) of the stool. You may have this test every year starting at age 29. Flexible sigmoidoscopy or colonoscopy. You may have a sigmoidoscopy every 5 years or a colonoscopy every 10 years starting at age 39. Prostate cancer screening. Recommendations will vary depending on your family history and other risks. Hepatitis C blood test. Hepatitis B blood test. Sexually transmitted disease (STD) testing. Diabetes screening. This is done by checking your blood sugar (glucose) after you have not eaten for a while (fasting). You may have this done every 1-3 years. Abdominal aortic aneurysm (AAA) screening. You may need this if you are a current or former smoker. Osteoporosis. You may be screened starting at age 66 if you are at high risk. Talk with your health care provider about your test results, treatment options, and if necessary, the need for more tests. Vaccines  Your health care provider may recommend certain vaccines, such as: Influenza vaccine. This is recommended every year. Tetanus, diphtheria, and acellular pertussis (Tdap, Td) vaccine. You may need a Td booster every 10 years. Zoster vaccine. You may need this after age 23. Pneumococcal 13-valent conjugate (PCV13) vaccine. One dose is recommended after age 69. Pneumococcal polysaccharide (PPSV23) vaccine. One dose is recommended after age 11. Talk to your health care provider about  which screenings and vaccines you need and how often you need them. This  information is not intended to replace advice given to you by your health care provider. Make sure you discuss any questions you have with your health care provider. Document Released: 09/13/2015 Document Revised: 05/06/2016 Document Reviewed: 06/18/2015 Elsevier Interactive Patient Education  2017 [employer] Prevention in the Home Falls can cause injuries. They can happen to people of all ages. There are many things you can do to make your home safe and to help prevent falls. What can I do on the outside of my home? Regularly fix the edges of walkways and driveways and fix any cracks. Remove anything that might make you trip as you walk through a door, such as a raised step or threshold. Trim any bushes or trees on the path to your home. Use bright outdoor lighting. Clear any walking paths of anything that might make someone trip, such as rocks or tools. Regularly check to see if handrails are loose or broken. Make sure that both sides of any steps have handrails. Any raised decks and porches should have guardrails on the edges. Have any leaves, snow, or ice cleared regularly. Use sand or salt on walking paths during winter. Clean up any spills in your garage right away. This includes oil or grease spills. What can I do in the bathroom? Use night lights. Install grab bars by the toilet and in the tub and shower. Do not use towel bars as grab bars. Use non-skid mats or decals in the tub or shower. If you need to sit down in the shower, use a plastic, non-slip stool. Keep the floor dry. Clean up any water that spills on the floor as soon as it happens. Remove soap buildup in the tub or shower regularly. Attach bath mats securely with double-sided non-slip rug tape. Do not have throw rugs and other things on the floor that can make you trip. What can I do in the bedroom? Use night lights. Make sure that you have a light by your bed that is easy to reach. Do not use any sheets or  blankets that are too big for your bed. They should not hang down onto the floor. Have a firm chair that has side arms. You can use this for support while you get dressed. Do not have throw rugs and other things on the floor that can make you trip. What can I do in the kitchen? Clean up any spills right away. Avoid walking on wet floors. Keep items that you use a lot in easy-to-reach places. If you need to reach something above you, use a strong step stool that has a grab bar. Keep electrical cords out of the way. Do not use floor polish or wax that makes floors slippery. If you must use wax, use non-skid floor wax. Do not have throw rugs and other things on the floor that can make you trip. What can I do with my stairs? Do not leave any items on the stairs. Make sure that there are handrails on both sides of the stairs and use them. Fix handrails that are broken or loose. Make sure that handrails are as long as the stairways. Check any carpeting to make sure that it is firmly attached to the stairs. Fix any carpet that is loose or worn. Avoid having throw rugs at the top or bottom of the stairs. If you do have throw rugs, attach them to the floor with  carpet tape. Make sure that you have a light switch at the top of the stairs and the bottom of the stairs. If you do not have them, ask someone to add them for you. What else can I do to help prevent falls? Wear shoes that: Do not have high heels. Have rubber bottoms. Are comfortable and fit you well. Are closed at the toe. Do not wear sandals. If you use a stepladder: Make sure that it is fully opened. Do not climb a closed stepladder. Make sure that both sides of the stepladder are locked into place. Ask someone to hold it for you, if possible. Clearly mark and make sure that you can see: Any grab bars or handrails. First and last steps. Where the edge of each step is. Use tools that help you move around (mobility aids) if they are  needed. These include: Canes. Walkers. Scooters. Crutches. Turn on the lights when you go into a dark area. Replace any light bulbs as soon as they burn out. Set up your furniture so you have a clear path. Avoid moving your furniture around. If any of your floors are uneven, fix them. If there are any pets around you, be aware of where they are. Review your medicines with your doctor. Some medicines can make you feel dizzy. This can increase your chance of falling. Ask your doctor what other things that you can do to help prevent falls. This information is not intended to replace advice given to you by your health care provider. Make sure you discuss any questions you have with your health care provider. Document Released: 06/13/2009 Document Revised: 01/23/2016 Document Reviewed: 09/21/2014 Elsevier Interactive Patient Education  2017 [employer].

## 2021-04-29 NOTE — Progress Notes (Signed)
Subjective:   Lucas Warren is a 75 y.o. male who presents for an Initial Medicare Annual Wellness Visit.   Virtual Visit via Video Note  I connected with Lucas Warren by a video enabled telemedicine application and verified that I am speaking with the correct person using two identifiers.  Location: Patient: Home Provider: Office Persons participating in the virtual visit: patient, provider   I discussed the limitations of evaluation and management by telemedicine and the availability of in person appointments. The patient expressed understanding and agreed to proceed.     Randel Pigg ,LPN  Review of Systems    N/a       Objective:    There were no vitals filed for this visit. There is no height or weight on file to calculate BMI.  Advanced Directives 05/20/2020 04/26/2020 01/18/2018 01/06/2017 03/25/2016  Does Patient Have a Medical Advance Directive? Yes Yes Yes Yes Yes  Type of Paramedic of [place];Living will - - Living will;Healthcare Power of Attorney  Does patient want to make changes to medical advance directive? - No - Patient declined - - -  Copy of [place] in Chart? - No - copy requested - - -    Current Medications (verified) Outpatient Encounter Medications as of 04/29/2021  Medication Sig   aspirin 81 MG tablet Take 1 tablet by mouth at bedtime.    empagliflozin (JARDIANCE) 10 MG TABS tablet Take 1 tablet (10 mg total) by mouth daily.   glimepiride (AMARYL) 4 MG tablet TAKE ONE TABLET BY MOUTH EVERY MORNING   glucose blood (ONETOUCH ULTRA) test strip Use one strip twice daily to check blood sugars   Lancets (ONETOUCH DELICA PLUS 123XX123) MISC USE 1  TO CHECK GLUCOSE ONCE DAILY   Lancets (ONETOUCH DELICA PLUS 123XX123) MISC USE 1  TO CHECK GLUCOSE TWICE DAILY   lisinopril (ZESTRIL) 10 MG tablet Take 1 tablet (10 mg total) by mouth daily.   metFORMIN (GLUCOPHAGE) 500 MG tablet  TAKE TWO TABLETS BY MOUTH TWICE DAILY WITH A meal   montelukast (SINGULAIR) 10 MG tablet Take 1 tablet (10 mg total) by mouth at bedtime.   nitroGLYCERIN (NITROSTAT) 0.4 MG SL tablet Take one tablet sublingual as needed for chest pain up to three doses as needed. (Patient taking differently: Place 0.4 mg under the tongue every 5 (five) minutes as needed for chest pain.)   pioglitazone (ACTOS) 45 MG tablet TAKE ONE TABLET BY MOUTH EVERY MORNING   rosuvastatin (CRESTOR) 20 MG tablet Take 1 tablet (20 mg total) by mouth daily.   Semaglutide,0.25 or 0.'5MG'$ /DOS, (OZEMPIC, 0.25 OR 0.5 MG/DOSE,) 2 MG/1.5ML SOPN Start 0.25 mg [place] once weekly for 4 weeks and then increase to 0.5 mg [place] weekly.   tadalafil (CIALIS) 20 MG tablet Take one every other day as needed for erectile dysfunction.   vitamin B-12 (CYANOCOBALAMIN) 1000 MCG tablet Take 1,000 mcg by mouth at bedtime.    No facility-administered encounter medications on file as of 04/29/2021.    Allergies (verified) Lisinopril, Erythromycin, and Trulicity [dulaglutide]   History: Past Medical History:  Diagnosis Date   Allergy    seasonal   Anxiety    Chest pain, atypical    Colon polyps    hyperplastic 2003   DJD (degenerative joint disease)    DM (diabetes mellitus) ([place])    GERD (gastroesophageal reflux disease)    History of headache    Hypercholesteremia    Psoriasis  Past Surgical History:  Procedure Laterality Date   CATARACT EXTRACTION, BILATERAL Bilateral    Dr. Tommy Rainwater;    [place] CATH AND CORONARY ANGIOGRAPHY N/A 05/20/2020   Procedure: LEFT HEART CATH AND CORONARY ANGIOGRAPHY;  Surgeon: Jettie Booze, MD;  Location: [facility];  Service: Cardiovascular;  Laterality: N/A;   NASAL SEPTUM SURGERY  1989   Family History  Problem Relation Age of Onset   Lung cancer Mother    Alzheimer's disease Father    Heart disease Father    Leukemia Sister    Social History    Socioeconomic History   Marital status: Divorced    Spouse name: carolyn   Number of children: Not on file   Years of education: Not on file   Highest education level: Not on file  Occupational History    Comment: Patent examiner  Tobacco Use   Smoking status: Former    Packs/day: 2.00    Years: 20.00    Pack years: 40.00    Types: Cigarettes    Quit date: 08/31/1981    Years since quitting: 39.6   Smokeless tobacco: Never  Vaping Use   Vaping Use: Never used  Substance and Sexual Activity   Alcohol use: Yes    Comment: rarely a beer   Drug use: No   Sexual activity: Not on file  Other Topics Concern   Not on file  Social History Narrative   Not on file   Social Determinants of Health   Financial Resource Strain: Low Risk    Difficulty of Paying Living Expenses: Not hard at all  Food Insecurity: No Food Insecurity   Worried About Charity fundraiser in the Last Year: Never true   [place] in the Last Year: Never true  Transportation Needs: No Transportation Needs   Lack of Transportation (Medical): No   Lack of Transportation (Non-Medical): No  Physical Activity: Inactive   Days of Exercise per Week: 0 days   Minutes of Exercise per Session: 0 min  Stress: No Stress Concern Present   Feeling of Stress : Not at all  Social Connections: Moderately Integrated   Frequency of Communication with Friends and Family: More than three times a week   Frequency of Social Gatherings with Friends and Family: Three times a week   Attends Religious Services: More than 4 times per year   Active Member of Clubs or Organizations: Yes   Attends Music therapist: More than 4 times per year   Marital Status: Divorced    Tobacco Counseling Counseling given: Not Answered   Clinical Intake:                 Diabetic?yes Nutrition Risk Assessment:  Has the patient had any N/V/D within the last 2 months?  No  Does the patient have any  non-healing wounds?  No  Has the patient had any unintentional weight loss or weight gain?  No   Diabetes:  Is the patient diabetic?  Yes  If diabetic, was a CBG obtained today?  No  Did the patient bring in their glucometer from home?  No  How often do you monitor your CBG's? Daily .   Financial Strains and Diabetes Management:  Are you having any financial strains with the device, your supplies or your medication? No .  Does the patient want to be seen by Chronic Care Management for management of  their diabetes?  No  Would the patient like to be referred to a Nutritionist or for Diabetic Management?  No   Diabetic Exams:  Diabetic Eye Exam: Completed 03/2021 Diabetic Foot Exam: Overdue, Pt has been advised about the importance in completing this exam. Pt is scheduled for diabetic foot exam on next office visit .          Activities of Daily Living No flowsheet data found.  Patient Care Team: [name], MD as PCP - General (Family Medicine) Jettie Booze, MD as PCP - Cardiology (Cardiology) Viona Gilmore, [facility] as Pharmacist (Pharmacist)  Indicate any recent Medical Services you may have received from other than [facility] providers in the past year (date may be approximate).     Assessment:   This is a routine wellness examination for [place].  Hearing/Vision screen No results found.  Dietary issues and exercise activities discussed:     Goals Addressed   None    Depression Screen PHQ 2/9 Scores 04/26/2020 11/06/2019 01/18/2018 01/10/2018 01/06/2017 01/06/2017 12/25/2015  PHQ - 2 Score 0 0 0 3 0 0 0  PHQ- 9 Score 0 0 - 4 - - -    Fall Risk Fall Risk  04/26/2020 11/06/2019 01/18/2018 01/10/2018 01/06/2017  Falls in the past year? 0 0 No No No  Number falls in past yr: 0 - - - -  Injury with Fall? 0 - - - -  Risk for fall due to : Medication side effect - - - -  Follow up Falls evaluation completed;Falls prevention discussed - - - -    FALL RISK PREVENTION  PERTAINING TO THE HOME:  Any stairs in or around the home? No  If so, are there any without handrails? Yes  Home free of loose throw rugs in walkways, pet beds, electrical cords, etc? Yes  Adequate lighting in your home to reduce risk of falls? Yes   ASSISTIVE DEVICES UTILIZED TO PREVENT FALLS:  Life alert? No  Use of a cane, walker or w/c? No  Grab bars in the bathroom? Yes  Shower chair or bench in shower? Yes  Elevated toilet seat or a handicapped toilet? Yes    Cognitive Function: Normal cognitive status assessed by direct observation by this Nurse Health Advisor. No abnormalities found.   MMSE - Mini Mental State Exam 01/18/2018 01/06/2017  Not completed: (No Data) (No Data)        Immunizations Immunization History  Administered Date(s) Administered   Fluad Quad(high Dose 65+) 05/09/2019, 06/11/2020   Influenza Split 10/05/2011   Influenza Whole 07/16/2009   Influenza, High Dose Seasonal PF 05/22/2015, 06/29/2016, 06/15/2017, 05/20/2018   Influenza,inj,Quad PF,6+ Mos 07/07/2013, 05/04/2014   PFIZER(Purple Top)SARS-COV-2 Vaccination 10/22/2019, 11/15/2019, 07/09/2020   Pneumococcal Conjugate-13 08/21/2014   Pneumococcal Polysaccharide-23 12/05/2012   Pneumococcal-Unspecified 10/11/2007   Td 03/09/2006   Tdap 03/25/2016   Zoster, Live 03/31/2016    TDAP status: Up to date  Flu Vaccine status: Up to date  Pneumococcal vaccine status: Up to date  Covid-19 vaccine status: Completed vaccines  Qualifies for Shingles Vaccine? Yes   Zostavax completed No   Shingrix Completed?: No.    Education has been provided regarding the importance of this vaccine. Patient has been advised to call insurance company to determine out of pocket expense if they have not yet received this vaccine. Advised may also receive vaccine at local pharmacy or Health Dept. Verbalized acceptance and understanding.  Screening Tests Health Maintenance  Topic Date Due  Zoster Vaccines- Shingrix  (1 of 2) Never done   COVID-19 Vaccine (4 - Booster for Pfizer series) 11/06/2020   INFLUENZA VACCINE  03/31/2021   OPHTHALMOLOGY EXAM  04/02/2021   FOOT EXAM  06/30/2021 (Originally 05/08/2020)   PNA vac Low Risk Adult (2 of 2 - PPSV23) 03/16/2034 (Originally 12/05/2017)   HEMOGLOBIN A1C  09/10/2021   COLONOSCOPY (Pts 45-74yr Insurance coverage will need to be confirmed)  06/10/2022   TETANUS/TDAP  03/25/2026   Hepatitis C Screening  Completed   HPV VACCINES  Aged Out    Health Maintenance  Health Maintenance Due  Topic Date Due   Zoster Vaccines- Shingrix (1 of 2) Never done   COVID-19 Vaccine (4 - Booster for Pfizer series) 11/06/2020   INFLUENZA VACCINE  03/31/2021   OPHTHALMOLOGY EXAM  04/02/2021    Colorectal cancer screening: Type of screening: Colonoscopy. Completed 06/10/2012. Repeat every 10 years  Lung Cancer Screening: (Low Dose CT Chest recommended if Age 73-80years, 30 pack-year currently smoking OR have quit w/in 15years.) does not qualify.   Lung Cancer Screening Referral: n/a  Additional Screening:  Hepatitis C Screening: does not qualify; Completed 03/25/2016  Vision Screening: Recommended annual ophthalmology exams for early detection of glaucoma and other disorders of the eye. Is the patient up to date with their annual eye exam?  Yes  Who is the provider or what is the name of the office in which the patient attends annual eye exams? Dr.McFarland  If pt is not established with a provider, would they like to b e referred to a provider to establish care? No .   Dental Screening: Recommended annual dental exams for proper oral hygiene  Community Resource Referral / Chronic Care Management: CRR required this visit?  No   CCM required this visit?  No      Plan:     I have personally reviewed and noted the following in the patient's chart:   Medical and social history Use of alcohol, tobacco or illicit drugs  Current medications and supplements  including opioid prescriptions. Patient is not currently taking opioid prescriptions. Functional ability and status Nutritional status Physical activity Advanced directives List of other physicians Hospitalizations, surgeries, and ER visits in previous 12 months Vitals Screenings to include cognitive, depression, and falls Referrals and appointments  In addition, I have reviewed and discussed with patient certain preventive protocols, quality metrics, and best practice recommendations. A written personalized care plan for preventive services as well as general preventive health recommendations were provided to patient.     LRandel Pigg LPN   [id number]  Nurse Notes: none

## 2021-05-06 DIAGNOSIS — Z85828 Personal history of other malignant neoplasm of skin: Secondary | ICD-10-CM | POA: Diagnosis not present

## 2021-05-06 DIAGNOSIS — D1801 Hemangioma of skin and subcutaneous tissue: Secondary | ICD-10-CM | POA: Diagnosis not present

## 2021-05-06 DIAGNOSIS — L814 Other melanin hyperpigmentation: Secondary | ICD-10-CM | POA: Diagnosis not present

## 2021-05-06 DIAGNOSIS — L570 Actinic keratosis: Secondary | ICD-10-CM | POA: Diagnosis not present

## 2021-05-06 DIAGNOSIS — L821 Other seborrheic keratosis: Secondary | ICD-10-CM | POA: Diagnosis not present

## 2021-05-12 ENCOUNTER — Telehealth: Payer: Self-pay | Admitting: Pharmacist

## 2021-05-12 NOTE — Chronic Care Management (AMB) (Signed)
    Chronic Care Management Pharmacy Assistant  05-12-2021 APPOINTMENT Lucas Warren was reminded to have all medications, supplements and any blood glucose and blood pressure readings available for review with [name], Pharm. D, at his telephone visit on 05-13-2021 at 1.  Care Gaps: Zoster Vaccine - Overdue Covid Booster#4 Therapist, music) - Overdue Flu Vaccine - Overdue Eye Exam - Overdue AWV - Scheduled for 04-29-21 CCM F/U call - 05-13-21 1 pm   Star Rating Drug: Jardiance 10 mg - Last filed 04-04-21 30 [facility] at [facility] Rosuvastatin 20 mg - Last filled 04-04-21 30 [facility] at [facility] Lisinopril 10 mg- Last filled 04-04-2021 30 [facility] at [facility] Pioglitazone 45 mg - Last filled 04-04-21 30 [facility] at [facility] Metformin 500 mg - Last filled 04-04-2021 30 DS at [facility] Ozempic 0.5 mg - not taking    Any gaps in medications fill history? None   Medications: Outpatient Encounter Medications as of 05/12/2021  Medication Sig   aspirin 81 MG tablet Take 1 tablet by mouth at bedtime.    empagliflozin (JARDIANCE) 10 MG TABS tablet Take 1 tablet (10 mg total) by mouth daily.   glimepiride (AMARYL) 4 MG tablet TAKE ONE TABLET BY MOUTH EVERY MORNING   glucose blood (ONETOUCH ULTRA) test strip Use one strip twice daily to check blood sugars   Lancets (ONETOUCH DELICA PLUS 123XX123) MISC USE 1  TO CHECK GLUCOSE ONCE DAILY   Lancets (ONETOUCH DELICA PLUS 123XX123) MISC USE 1  TO CHECK GLUCOSE TWICE DAILY   lisinopril (ZESTRIL) 10 MG tablet Take 1 tablet (10 mg total) by mouth daily.   metFORMIN (GLUCOPHAGE) 500 MG tablet TAKE TWO TABLETS BY MOUTH TWICE DAILY WITH A meal   montelukast (SINGULAIR) 10 MG tablet Take 1 tablet (10 mg total) by mouth at bedtime.   nitroGLYCERIN (NITROSTAT) 0.4 MG SL tablet Take one tablet sublingual as needed for chest pain up to three doses as needed. (Patient taking differently: Place 0.4 mg under the tongue every 5 (five) minutes as needed for chest pain.)    pioglitazone (ACTOS) 45 MG tablet TAKE ONE TABLET BY MOUTH EVERY MORNING   rosuvastatin (CRESTOR) 20 MG tablet Take 1 tablet (20 mg total) by mouth daily.   Semaglutide,0.25 or 0.'5MG'$ /DOS, (OZEMPIC, 0.25 OR 0.5 MG/DOSE,) 2 MG/1.5ML SOPN Start 0.25 mg [place] once weekly for 4 weeks and then increase to 0.5 mg [place] weekly.   tadalafil (CIALIS) 20 MG tablet Take one every other day as needed for erectile dysfunction.   vitamin B-12 (CYANOCOBALAMIN) 1000 MCG tablet Take 1,000 mcg by mouth at bedtime.    No facility-administered encounter medications on file as of 05/12/2021.   [place] Clinical Pharmacist Assistant [phone]

## 2021-05-13 ENCOUNTER — Ambulatory Visit (INDEPENDENT_AMBULATORY_CARE_PROVIDER_SITE_OTHER): Payer: Medicare Other | Admitting: Pharmacist

## 2021-05-13 DIAGNOSIS — I10 Essential (primary) hypertension: Secondary | ICD-10-CM

## 2021-05-13 DIAGNOSIS — E1165 Type 2 diabetes mellitus with hyperglycemia: Secondary | ICD-10-CM

## 2021-05-13 NOTE — Progress Notes (Signed)
 Chronic Care Management Pharmacy Note  05/14/2021 Name:  Lucas Warren MRN:  [id number] DOB:  02/21/1946  Summary: A1c not at goal < 7% but home blood sugars have improved  Recommendations/Changes made from today's visit: -Recommend increasing Ozempic to 0.75 mg weekly based on patient's concern for side effects with the goal to increase to 1 mg weekly. -Could consider decreasing glimepiride to 2 mg depending on home blood sugars with Ozempic  Plan: -Follow up DM assessment in 2 weeks  Subjective: Lucas Warren is an 75 y.o. year old male who is a primary patient of Burchette, Bruce W, MD.  The CCM team was consulted for assistance with disease management and care coordination needs.    Engaged with patient by telephone for follow up visit in response to provider referral for pharmacy case management and/or care coordination services.   Consent to Services:  The patient was given information about Chronic Care Management services, agreed to services, and gave verbal consent prior to initiation of services.  Please see initial visit note for detailed documentation.   Patient Care Team: Burchette, Bruce W, MD as PCP - General (Family Medicine) Varanasi, Jayadeep S, MD as PCP - Cardiology (Cardiology) Pryor, Madeline G, RPH as Pharmacist (Pharmacist)  Recent office visits: 04/29/21 Laura Viers, LPN: Patient presented for AWV.  03/10/21 Bruce Burchette, MD: Patient presented for DM follow up. A1c decreased to 7.7%. Patient preferred to work on diet and exercise.   12/09/20 Bruce Burchette, MD: Patient presented for DM follow up. A1c decreased to 7.9%. Recommended to increase Ozempic to 0.5 mg once weekly next week.  Recent consult visits: 08/08/20 05/07/20 Jayadeep Varanasi, MD (cardiology): Patient presented for follow up.  No changes made.   05/07/20 Jayadeep Varanasi, MD (cardiology): Patient presented with chest pressure and initial work up. Switched pravastatin 40 mg to  rosuvastatin 20 mg and started isosorbide mononitrate 30 mg daily.  Hospital visits: None in previous 6 months  Objective:  Lab Results  Component Value Date   CREATININE 1.62 (H) 06/11/2020   BUN 28 (H) 06/11/2020   GFR 42.45 (L) 08/15/2019   GFRNONAA 42 (L) 06/11/2020   GFRAA 48 (L) 06/11/2020   NA 140 06/11/2020   K 4.3 06/11/2020   CALCIUM 9.1 06/11/2020   CO2 23 06/11/2020   GLUCOSE 95 06/11/2020    Lab Results  Component Value Date/Time   HGBA1C 7.7 (A) 03/10/2021 09:26 AM   HGBA1C 7.9 (A) 12/09/2020 07:58 AM   HGBA1C 8.3 (H) 12/25/2015 08:39 AM   HGBA1C 8.4 10/14/2015 12:00 AM   HGBA1C 7.3 (H) 05/22/2015 09:53 AM   GFR 42.45 (L) 08/15/2019 07:32 AM   GFR 45.36 (L) 08/07/2019 08:48 AM   MICROALBUR 2.5 06/11/2020 08:23 AM   MICROALBUR 4.3 (H) 01/10/2018 08:54 AM    Last diabetic Eye exam:  Lab Results  Component Value Date/Time   HMDIABEYEEXA No Retinopathy 04/02/2020 12:00 AM    Last diabetic Foot exam:  Lab Results  Component Value Date/Time   HMDIABFOOTEX normal 03/25/2016 12:00 AM     Lab Results  Component Value Date   CHOL 127 06/11/2020   HDL 50 06/11/2020   LDLCALC 57 06/11/2020   TRIG 117 06/11/2020   CHOLHDL 2.5 06/11/2020    Hepatic Function Latest Ref Rng & Units 06/11/2020 08/07/2019 11/11/2018  Total Protein 6.1 - 8.1 g/dL 6.7 7.0 7.2  Albumin 3.5 - 5.2 g/dL - 4.5 4.6  AST 10 - 35 U/L 12 12 11  ALT    Chronic Care Management Pharmacy Note  05/14/2021 Name:  Lucas Warren MRN:  [id number] DOB:  02/21/1946  Summary: A1c not at goal < 7% but home blood sugars have improved  Recommendations/Changes made from today's visit: -Recommend increasing Ozempic to 0.75 mg weekly based on patient's concern for side effects with the goal to increase to 1 mg weekly. -Could consider decreasing glimepiride to 2 mg depending on home blood sugars with Ozempic  Plan: -Follow up DM assessment in 2 weeks  Subjective: Lucas Warren is an 75 y.o. year old male who is a primary patient of Burchette, Bruce W, MD.  The CCM team was consulted for assistance with disease management and care coordination needs.    Engaged with patient by telephone for follow up visit in response to provider referral for pharmacy case management and/or care coordination services.   Consent to Services:  The patient was given information about Chronic Care Management services, agreed to services, and gave verbal consent prior to initiation of services.  Please see initial visit note for detailed documentation.   Patient Care Team: Burchette, Bruce W, MD as PCP - General (Family Medicine) Varanasi, Jayadeep S, MD as PCP - Cardiology (Cardiology) Houa Ackert G, RPH as Pharmacist (Pharmacist)  Recent office visits: 04/29/21 Laura Viers, LPN: Patient presented for AWV.  03/10/21 Bruce Burchette, MD: Patient presented for DM follow up. A1c decreased to 7.7%. Patient preferred to work on diet and exercise.   12/09/20 Bruce Burchette, MD: Patient presented for DM follow up. A1c decreased to 7.9%. Recommended to increase Ozempic to 0.5 mg once weekly next week.  Recent consult visits: 08/08/20 05/07/20 Jayadeep Varanasi, MD (cardiology): Patient presented for follow up.  No changes made.   05/07/20 Jayadeep Varanasi, MD (cardiology): Patient presented with chest pressure and initial work up. Switched pravastatin 40 mg to  rosuvastatin 20 mg and started isosorbide mononitrate 30 mg daily.  Hospital visits: None in previous 6 months  Objective:  Lab Results  Component Value Date   CREATININE 1.62 (H) 06/11/2020   BUN 28 (H) 06/11/2020   GFR 42.45 (L) 08/15/2019   GFRNONAA 42 (L) 06/11/2020   GFRAA 48 (L) 06/11/2020   NA 140 06/11/2020   K 4.3 06/11/2020   CALCIUM 9.1 06/11/2020   CO2 23 06/11/2020   GLUCOSE 95 06/11/2020    Lab Results  Component Value Date/Time   HGBA1C 7.7 (A) 03/10/2021 09:26 AM   HGBA1C 7.9 (A) 12/09/2020 07:58 AM   HGBA1C 8.3 (H) 12/25/2015 08:39 AM   HGBA1C 8.4 10/14/2015 12:00 AM   HGBA1C 7.3 (H) 05/22/2015 09:53 AM   GFR 42.45 (L) 08/15/2019 07:32 AM   GFR 45.36 (L) 08/07/2019 08:48 AM   MICROALBUR 2.5 06/11/2020 08:23 AM   MICROALBUR 4.3 (H) 01/10/2018 08:54 AM    Last diabetic Eye exam:  Lab Results  Component Value Date/Time   HMDIABEYEEXA No Retinopathy 04/02/2020 12:00 AM    Last diabetic Foot exam:  Lab Results  Component Value Date/Time   HMDIABFOOTEX normal 03/25/2016 12:00 AM     Lab Results  Component Value Date   CHOL 127 06/11/2020   HDL 50 06/11/2020   LDLCALC 57 06/11/2020   TRIG 117 06/11/2020   CHOLHDL 2.5 06/11/2020    Hepatic Function Latest Ref Rng & Units 06/11/2020 08/07/2019 11/11/2018  Total Protein 6.1 - 8.1 g/dL 6.7 7.0 7.2  Albumin 3.5 - 5.2 g/dL - 4.5 4.6  AST 10 - 35 U/L 12 12 11  ALT   Chronic Care Management Pharmacy Note  05/14/2021 Name:  Lucas Warren MRN:  [id number] DOB:  Dec 07, 1945  Summary: A1c not at goal < 7% but home blood sugars have improved  Recommendations/Changes made from today's visit: -Recommend increasing Ozempic to 0.75 mg weekly based on patient's concern for side effects with the goal to increase to 1 mg weekly. -Could consider decreasing glimepiride to 2 mg depending on home blood sugars with Ozempic  Plan: -Follow up DM assessment in 2 weeks  Subjective: Lucas Warren is an 75 y.o. year old male who is a primary patient of Burchette, Alinda Sierras, MD.  The CCM team was consulted for assistance with disease management and care coordination needs.    Engaged with patient by telephone for follow up visit in response to provider referral for pharmacy case management and/or care coordination services.   Consent to Services:  The patient was given information about Chronic Care Management services, agreed to services, and gave verbal consent prior to initiation of services.  Please see initial visit note for detailed documentation.   Patient Care Team: [name], MD as PCP - General (Family Medicine) Jettie Booze, MD as PCP - Cardiology (Cardiology) Viona Gilmore, [facility] as Pharmacist (Pharmacist)  Recent office visits: 04/29/21 Randel Pigg, LPN: Patient presented for AWV.  03/10/21 [name], MD: Patient presented for DM follow up. A1c decreased to 7.7%. Patient preferred to work on diet and exercise.   12/09/20 [name], MD: Patient presented for DM follow up. A1c decreased to 7.9%. Recommended to increase Ozempic to 0.5 mg once weekly next week.  Recent consult visits: 08/08/20 05/07/20 [name], MD (cardiology): Patient presented for follow up.  No changes made.   05/07/20 [name], MD (cardiology): Patient presented with chest pressure and initial work up. Switched pravastatin 40 mg to  rosuvastatin 20 mg and started isosorbide mononitrate 30 mg daily.  Hospital visits: None in previous 6 months  Objective:  Lab Results  Component Value Date   CREATININE 1.62 (H) 06/11/2020   BUN 28 (H) 06/11/2020   GFR 42.45 (L) 08/15/2019   GFRNONAA 42 (L) 06/11/2020   GFRAA 48 (L) 06/11/2020   NA 140 06/11/2020   K 4.3 06/11/2020   CALCIUM 9.1 06/11/2020   CO2 23 06/11/2020   GLUCOSE 95 06/11/2020    Lab Results  Component Value Date/Time   HGBA1C 7.7 (A) 03/10/2021 09:26 AM   HGBA1C 7.9 (A) 12/09/2020 07:58 AM   HGBA1C 8.3 (H) 12/25/2015 08:39 AM   HGBA1C 8.4 10/14/2015 12:00 AM   HGBA1C 7.3 (H) 05/22/2015 09:53 AM   GFR 42.45 (L) 08/15/2019 07:32 AM   GFR 45.36 (L) 08/07/2019 08:48 AM   MICROALBUR 2.5 06/11/2020 08:23 AM   MICROALBUR 4.3 (H) 01/10/2018 08:54 AM    Last diabetic Eye exam:  Lab Results  Component Value Date/Time   HMDIABEYEEXA No Retinopathy 04/02/2020 12:00 AM    Last diabetic Foot exam:  Lab Results  Component Value Date/Time   HMDIABFOOTEX normal 03/25/2016 12:00 AM     Lab Results  Component Value Date   CHOL 127 06/11/2020   HDL 50 06/11/2020   LDLCALC 57 06/11/2020   TRIG 117 06/11/2020   CHOLHDL 2.5 06/11/2020    Hepatic Function Latest Ref Rng & Units 06/11/2020 08/07/2019 11/11/2018  Total Protein 6.1 - 8.1 g/dL 6.7 7.0 7.2  Albumin 3.5 - 5.2 g/dL - 4.5 4.6  AST 10 - 35 U/L _0 ALT  Chronic Care Management Pharmacy Note  05/14/2021 Name:  Lucas Warren MRN:  [id number] DOB:  Dec 07, 1945  Summary: A1c not at goal < 7% but home blood sugars have improved  Recommendations/Changes made from today's visit: -Recommend increasing Ozempic to 0.75 mg weekly based on patient's concern for side effects with the goal to increase to 1 mg weekly. -Could consider decreasing glimepiride to 2 mg depending on home blood sugars with Ozempic  Plan: -Follow up DM assessment in 2 weeks  Subjective: Lucas Warren is an 75 y.o. year old male who is a primary patient of Burchette, Alinda Sierras, MD.  The CCM team was consulted for assistance with disease management and care coordination needs.    Engaged with patient by telephone for follow up visit in response to provider referral for pharmacy case management and/or care coordination services.   Consent to Services:  The patient was given information about Chronic Care Management services, agreed to services, and gave verbal consent prior to initiation of services.  Please see initial visit note for detailed documentation.   Patient Care Team: [name], MD as PCP - General (Family Medicine) Jettie Booze, MD as PCP - Cardiology (Cardiology) Viona Gilmore, [facility] as Pharmacist (Pharmacist)  Recent office visits: 04/29/21 Randel Pigg, LPN: Patient presented for AWV.  03/10/21 [name], MD: Patient presented for DM follow up. A1c decreased to 7.7%. Patient preferred to work on diet and exercise.   12/09/20 [name], MD: Patient presented for DM follow up. A1c decreased to 7.9%. Recommended to increase Ozempic to 0.5 mg once weekly next week.  Recent consult visits: 08/08/20 05/07/20 [name], MD (cardiology): Patient presented for follow up.  No changes made.   05/07/20 [name], MD (cardiology): Patient presented with chest pressure and initial work up. Switched pravastatin 40 mg to  rosuvastatin 20 mg and started isosorbide mononitrate 30 mg daily.  Hospital visits: None in previous 6 months  Objective:  Lab Results  Component Value Date   CREATININE 1.62 (H) 06/11/2020   BUN 28 (H) 06/11/2020   GFR 42.45 (L) 08/15/2019   GFRNONAA 42 (L) 06/11/2020   GFRAA 48 (L) 06/11/2020   NA 140 06/11/2020   K 4.3 06/11/2020   CALCIUM 9.1 06/11/2020   CO2 23 06/11/2020   GLUCOSE 95 06/11/2020    Lab Results  Component Value Date/Time   HGBA1C 7.7 (A) 03/10/2021 09:26 AM   HGBA1C 7.9 (A) 12/09/2020 07:58 AM   HGBA1C 8.3 (H) 12/25/2015 08:39 AM   HGBA1C 8.4 10/14/2015 12:00 AM   HGBA1C 7.3 (H) 05/22/2015 09:53 AM   GFR 42.45 (L) 08/15/2019 07:32 AM   GFR 45.36 (L) 08/07/2019 08:48 AM   MICROALBUR 2.5 06/11/2020 08:23 AM   MICROALBUR 4.3 (H) 01/10/2018 08:54 AM    Last diabetic Eye exam:  Lab Results  Component Value Date/Time   HMDIABEYEEXA No Retinopathy 04/02/2020 12:00 AM    Last diabetic Foot exam:  Lab Results  Component Value Date/Time   HMDIABFOOTEX normal 03/25/2016 12:00 AM     Lab Results  Component Value Date   CHOL 127 06/11/2020   HDL 50 06/11/2020   LDLCALC 57 06/11/2020   TRIG 117 06/11/2020   CHOLHDL 2.5 06/11/2020    Hepatic Function Latest Ref Rng & Units 06/11/2020 08/07/2019 11/11/2018  Total Protein 6.1 - 8.1 g/dL 6.7 7.0 7.2  Albumin 3.5 - 5.2 g/dL - 4.5 4.6  AST 10 - 35 U/L _0 ALT   Chronic Care Management Pharmacy Note  05/14/2021 Name:  Lucas Warren MRN:  [id number] DOB:  02/21/1946  Summary: A1c not at goal < 7% but home blood sugars have improved  Recommendations/Changes made from today's visit: -Recommend increasing Ozempic to 0.75 mg weekly based on patient's concern for side effects with the goal to increase to 1 mg weekly. -Could consider decreasing glimepiride to 2 mg depending on home blood sugars with Ozempic  Plan: -Follow up DM assessment in 2 weeks  Subjective: Lucas Warren is an 75 y.o. year old male who is a primary patient of Burchette, Bruce W, MD.  The CCM team was consulted for assistance with disease management and care coordination needs.    Engaged with patient by telephone for follow up visit in response to provider referral for pharmacy case management and/or care coordination services.   Consent to Services:  The patient was given information about Chronic Care Management services, agreed to services, and gave verbal consent prior to initiation of services.  Please see initial visit note for detailed documentation.   Patient Care Team: Burchette, Bruce W, MD as PCP - General (Family Medicine) Varanasi, Jayadeep S, MD as PCP - Cardiology (Cardiology) , Salayah Meares G, RPH as Pharmacist (Pharmacist)  Recent office visits: 04/29/21 Laura Viers, LPN: Patient presented for AWV.  03/10/21 Bruce Burchette, MD: Patient presented for DM follow up. A1c decreased to 7.7%. Patient preferred to work on diet and exercise.   12/09/20 Bruce Burchette, MD: Patient presented for DM follow up. A1c decreased to 7.9%. Recommended to increase Ozempic to 0.5 mg once weekly next week.  Recent consult visits: 08/08/20 05/07/20 Jayadeep Varanasi, MD (cardiology): Patient presented for follow up.  No changes made.   05/07/20 Jayadeep Varanasi, MD (cardiology): Patient presented with chest pressure and initial work up. Switched pravastatin 40 mg to  rosuvastatin 20 mg and started isosorbide mononitrate 30 mg daily.  Hospital visits: None in previous 6 months  Objective:  Lab Results  Component Value Date   CREATININE 1.62 (H) 06/11/2020   BUN 28 (H) 06/11/2020   GFR 42.45 (L) 08/15/2019   GFRNONAA 42 (L) 06/11/2020   GFRAA 48 (L) 06/11/2020   NA 140 06/11/2020   K 4.3 06/11/2020   CALCIUM 9.1 06/11/2020   CO2 23 06/11/2020   GLUCOSE 95 06/11/2020    Lab Results  Component Value Date/Time   HGBA1C 7.7 (A) 03/10/2021 09:26 AM   HGBA1C 7.9 (A) 12/09/2020 07:58 AM   HGBA1C 8.3 (H) 12/25/2015 08:39 AM   HGBA1C 8.4 10/14/2015 12:00 AM   HGBA1C 7.3 (H) 05/22/2015 09:53 AM   GFR 42.45 (L) 08/15/2019 07:32 AM   GFR 45.36 (L) 08/07/2019 08:48 AM   MICROALBUR 2.5 06/11/2020 08:23 AM   MICROALBUR 4.3 (H) 01/10/2018 08:54 AM    Last diabetic Eye exam:  Lab Results  Component Value Date/Time   HMDIABEYEEXA No Retinopathy 04/02/2020 12:00 AM    Last diabetic Foot exam:  Lab Results  Component Value Date/Time   HMDIABFOOTEX normal 03/25/2016 12:00 AM     Lab Results  Component Value Date   CHOL 127 06/11/2020   HDL 50 06/11/2020   LDLCALC 57 06/11/2020   TRIG 117 06/11/2020   CHOLHDL 2.5 06/11/2020    Hepatic Function Latest Ref Rng & Units 06/11/2020 08/07/2019 11/11/2018  Total Protein 6.1 - 8.1 g/dL 6.7 7.0 7.2  Albumin 3.5 - 5.2 g/dL - 4.5 4.6  AST 10 - 35 U/L 12 12 11  ALT

## 2021-05-14 ENCOUNTER — Other Ambulatory Visit: Payer: Self-pay

## 2021-05-14 NOTE — Telephone Encounter (Signed)
Received 5 boxes of Ozempic from the patient assistance program. Spoke with the patient. He is aware his medications have come in and will come by to pick them up. They have been placed in the fridge in the back pod.

## 2021-05-14 NOTE — Patient Instructions (Signed)
Visit Information   Goals Addressed   None    Patient Care Plan: CCM Pharmacy Care Plan     Problem Identified: Problem: Hypertension, Hyperlipidemia, Diabetes, Chronic Kidney Disease and ED      Long-Range Goal: Patient-Specific Goal   Start Date: 11/20/2020  Expected End Date: 11/20/2021  Recent Progress: On track  Priority: High  Note:   Current Barriers:  Unable to independently monitor therapeutic efficacy Unable to achieve control of diabetes   Pharmacist Clinical Goal(s):  Patient will achieve adherence to monitoring guidelines and medication adherence to achieve therapeutic efficacy achieve control of diabetes as evidenced by A1c  through collaboration with PharmD and provider.   Interventions: 1:1 collaboration with [name], MD regarding development and update of comprehensive plan of care as evidenced by provider attestation and co-signature Inter-disciplinary care team collaboration (see longitudinal plan of care) Comprehensive medication review performed; medication list updated in electronic medical record  Hypertension (BP goal <140/90) -Controlled -Current treatment: Lisinopril 58m, 1 tablet once daily -Medications previously tried: isosorbide  -Current home readings: not checking regularly -Current dietary habits: did not discuss -Current exercise habits: did not discuss -Denies hypotensive/hypertensive symptoms -Educated on Exercise goal of 150 minutes per week; Importance of home blood pressure monitoring; -Counseled to monitor BP at home weekly, document, and provide log at future appointments -Counseled on diet and exercise extensively Recommended to continue current medication  Hyperlipidemia: (LDL goal < 70) -Controlled -Current treatment: Rosuvastatin 272m 1 tablet once daily -Medications previously tried:  rosuvastatin (elevated BGs), simvastatin (myalgias)  -Current dietary patterns: did not discuss -Current exercise habits: did  not discuss -Educated on Cholesterol goals;  Importance of limiting foods high in cholesterol; Exercise goal of 150 minutes per week; -Recommended to continue current medication  Diabetes (A1c goal <7%) -Uncontrolled -Current medications: Glimepiride 46m90m1 tablet once daily with breakfast Empagliflozin (Jardiance) 20m25m tablet once daily Metformin 500mg61mtablets twice daily Pioglitazone (Actos) 45mg,81mablet once daily Ozempic, inject 0.5 mg once weekly -Medications previously tried: Onglyza (cost), Januvia (switch to GLP1), Trulicity (nausea)  -Current home glucose readings fasting glucose: 87, 90s, 106, 116, 125 (highest) post prandial glucose: 150 -Denies hypoglycemic/hyperglycemic symptoms -Current meal patterns:  breakfast: jimmy dean sausage biscuits x 2, blueberry muffin lunch: sandwich, beans, franks  dinner: cheeseburger; apple pie; frozen stuff peppers; [place] mac n'cheese and apples snacks: ice cream sandwiches, klondike bar drinks: n/a -Current exercise: little walking; plans to do more with cooler weather -Educated on Exercise goal of 150 minutes per week; Benefits of routine self-monitoring of blood sugar; Carbohydrate counting and/or plate method -Counseled to check feet daily and get yearly eye exams -Counseled on diet and exercise extensively Recommended to continue current medication Recommended adding protein to breakfast and adding more non starchy vegetables to meals  CKD (Goal: improve kidney function) -Controlled -Current treatment  Jardiance 10 mg 1 tablet daily -Medications previously tried: none  -Recommended to continue current medication  Primary prevention of ASCVD (Goal: prevent heart attacks and strokes) -Controlled -Current treatment  Aspirin 81 mg 1 tablet daily -Medications previously tried: none  -Counseled on monitoring for bleeding and bruising  ED (Goal: minimize symptoms) -Controlled -Current treatment  Cialis 5 mg 1  tablet as needed -Medications previously tried: sildenafil (no longer effective)  -Recommended to continue current medication   Health Maintenance -Vaccine gaps: shingrix, COVID booster, influenza -Current therapy:  Vitamin B12 1000 mcg 1 tablet daily -Educated on Cost vs benefit of each product must be carefully  weighed by individual consumer -Patient is satisfied with current therapy and denies issues -Recommended to continue current medication  Patient Goals/Self-Care Activities Patient will:  - take medications as prescribed check glucose daily, document, and provide at future appointments check blood pressure weekly, document, and provide at future appointments target a minimum of 150 minutes of moderate intensity exercise weekly engage in dietary modifications by eating more non-starchy vegetables than carbs  Follow Up Plan: Telephone follow up appointment with care management team member scheduled for: 3 months       Patient verbalizes understanding of instructions provided today and agrees to view in [place].  The pharmacy team will reach out to the patient again over the next 30 days.   Viona Gilmore, [facility]

## 2021-05-16 ENCOUNTER — Telehealth: Payer: Self-pay | Admitting: Pharmacist

## 2021-05-16 NOTE — Telephone Encounter (Signed)
Called patient to make him aware of the change with his Ozempic after discussion with PCP from CCM visit. Patient is aware to inject 0.75 mg (0.5 mg x 1 and then 0.25 mg x 1) weekly for his Ozempic. Patient verbalized his understanding. Patient has plenty of supply on hand right now and will pick up more today from the office.  Will plan to titrate his dose to 1 mg as long as he tolerates at PCP visit in October. Can consider reducing or stopping glimepiride at that time.

## 2021-05-18 ENCOUNTER — Other Ambulatory Visit: Payer: Self-pay | Admitting: Family Medicine

## 2021-05-23 ENCOUNTER — Telehealth: Payer: Self-pay | Admitting: Pharmacist

## 2021-05-23 NOTE — Chronic Care Management (AMB) (Signed)
Chronic Care Management Pharmacy Assistant   Name: Lucas Warren  MRN: [id number] DOB: October 09, 1945   Reason for Encounter: Medication Review/ Medication Coordination Call   Recent office visits:  None  Recent consult visits:  None   Hospital visits:  None in previous 6 months  Medications: Outpatient Encounter Medications as of 05/23/2021  Medication Sig   aspirin 81 MG tablet Take 1 tablet by mouth at bedtime.    empagliflozin (JARDIANCE) 10 MG TABS tablet Take 1 tablet (10 mg total) by mouth daily.   glimepiride (AMARYL) 4 MG tablet TAKE ONE TABLET BY MOUTH EVERY MORNING   glucose blood (ONETOUCH ULTRA) test strip Use one strip twice daily to check blood sugars   Lancets (ONETOUCH DELICA PLUS [id number]) MISC USE 1  TO CHECK GLUCOSE ONCE DAILY   Lancets (ONETOUCH DELICA PLUS [id number]) MISC USE 1  TO CHECK GLUCOSE TWICE DAILY   lisinopril (ZESTRIL) 10 MG tablet Take 1 tablet (10 mg total) by mouth daily.   metFORMIN (GLUCOPHAGE) 500 MG tablet TAKE TWO TABLETS BY MOUTH TWICE DAILY WITH A meal   montelukast (SINGULAIR) 10 MG tablet TAKE ONE TABLET BY MOUTH EVERYDAY AT BEDTIME   nitroGLYCERIN (NITROSTAT) 0.4 MG SL tablet Take one tablet sublingual as needed for chest pain up to three doses as needed. (Patient taking differently: Place 0.4 mg under the tongue every 5 (five) minutes as needed for chest pain.)   pioglitazone (ACTOS) 45 MG tablet TAKE ONE TABLET BY MOUTH EVERY MORNING   rosuvastatin (CRESTOR) 20 MG tablet Take 1 tablet (20 mg total) by mouth daily.   Semaglutide,0.25 or 0.5MG /DOS, (OZEMPIC, 0.25 OR 0.5 MG/DOSE,) 2 MG/1.5ML SOPN Start 0.25 mg [place] once weekly for 4 weeks and then increase to 0.5 mg [place] weekly.   tadalafil (CIALIS) 20 MG tablet TAKE ONE TABLET BY MOUTH every other DAY AS NEEDED   vitamin B-12 (CYANOCOBALAMIN) 1000 MCG tablet Take 1,000 mcg by mouth at bedtime.    No facility-administered encounter medications on file as of 05/23/2021.  Reviewed chart for  medication changes ahead of medication coordination call.  No OVs, Consults, or hospital visits since last care coordination call/Pharmacist visit. (If appropriate, list visit date, provider name)  No medication changes indicated OR if recent visit, treatment plan here.  BP Readings from Last 3 Encounters:  03/10/21 118/60  12/09/20 130/70  09/10/20 122/68    Lab Results  Component Value Date   HGBA1C 7.7 (A) 03/10/2021     Patient obtains medications through Adherence Packaging  30 Days   Last adherence delivery included:  Metformin (Glucophage) 500 mg: two tablets at breakfast and two at dinner Pioglitazone (Actos) 45 mg: one tablet at breakfast Glimepiride (Amaryl) 4 mg: one tablet at breakfast Lisinopril (Zestril) 10 mg: one tablet at dinner Rosuvastatin 20 mg: one tablet at bedtime Empagliflozin (Jardiance) 10 mg: one tablet at breakfast Montelukast (Singulair) 10 mg : one tablet at bedtime  Patient is due for next adherence delivery on: 06-02-2021. Called patient and reviewed medications and coordinated delivery. Confirmed Packaging for 30 DS   This delivery to include: Tadalafil (Cialis) 20 mg : take one tablet every other day PRN (Vial)  Montelukast (Singulair) 10 mg : one tablet at bedtime Metformin (Glucophage) 500 mg: two tablets at breakfast and two at dinner Pioglitazone (Actos) 45 mg: one tablet at breakfast Glimepiride (Amaryl) 4 mg: one tablet at breakfast Lisinopril (Zestril) 10 mg: one tablet at dinner Empagliflozin (Jardiance) 10 mg: one tablet at breakfast  Rosuvastatin 20 mg: one tablet at bedtime   Patient noted he was ok on strips and lancets has enough on hand  Confirmed delivery date of 06-02-2021, advised patient that pharmacy will contact them the morning of delivery.   Care Gaps: Zoster Vaccine - Overdue Covid Booster#4 Therapist, music) - Overdue Flu Vaccine - Overdue Eye Exam - Overdue AWV - Done 04-29-21 CCM - 09-08-20   Star Rating  Drug: Jardiance 10 mg - Last filed 04-28-21 30 DS at [facility] Rosuvastatin 20 mg - Last filled 04-28-21 30 [facility] at [facility] Lisinopril 10 mg- Last filled 04-28-2021 30 [facility] at [facility] Pioglitazone 45 mg - Last filled 04-28-21 30 [facility] at [facility] Metformin 500 mg - Last filled 04-28-2021 30 DS at [facility] Ozempic 0.5 mg - not taking  Ned Clines [place] Clinical Pharmacist Assistant [phone]

## 2021-05-26 ENCOUNTER — Other Ambulatory Visit: Payer: Self-pay | Admitting: Family Medicine

## 2021-05-28 ENCOUNTER — Encounter: Payer: Medicare Other | Admitting: Physician Assistant

## 2021-05-28 ENCOUNTER — Encounter: Payer: Self-pay | Admitting: Family Medicine

## 2021-05-28 ENCOUNTER — Telehealth (INDEPENDENT_AMBULATORY_CARE_PROVIDER_SITE_OTHER): Payer: Medicare Other | Admitting: Family Medicine

## 2021-05-28 ENCOUNTER — Telehealth: Payer: Self-pay | Admitting: Family Medicine

## 2021-05-28 VITALS — Ht 72.0 in

## 2021-05-28 DIAGNOSIS — U071 COVID-19: Secondary | ICD-10-CM

## 2021-05-28 DIAGNOSIS — R519 Headache, unspecified: Secondary | ICD-10-CM

## 2021-05-28 DIAGNOSIS — J310 Chronic rhinitis: Secondary | ICD-10-CM

## 2021-05-28 MED ORDER — MOLNUPIRAVIR EUA 200MG CAPSULE: 4.0000 | ORAL_CAPSULE | Freq: Two times a day (BID) | ORAL | 0 refills | Status: AC

## 2021-05-28 MED ORDER — IPRATROPIUM BROMIDE 0.06 % NA SOLN: 2.0000 | Freq: Four times a day (QID) | NASAL | 12 refills | Status: DC

## 2021-05-28 MED ORDER — PREDNISONE 20 MG PO TABS: 40.0000 mg | ORAL_TABLET | Freq: Every day | ORAL | 0 refills | Status: AC

## 2021-05-28 MED ORDER — DOXYCYCLINE HYCLATE 100 MG PO TABS: 100.0000 mg | ORAL_TABLET | Freq: Two times a day (BID) | ORAL | 0 refills | Status: DC

## 2021-05-28 NOTE — Telephone Encounter (Signed)
PT called to advise that he did the covid test and it ame back Positive and wanted to let Dr.Jordan know so she can change the medication. Please advise.

## 2021-05-28 NOTE — Telephone Encounter (Signed)
Medication has been changed.

## 2021-05-28 NOTE — Progress Notes (Addendum)
Virtual Visit via Video Note I connected with Lucas Warren on 05/28/21 by a video enabled telemedicine application and verified that I am speaking with the correct person using two identifiers.  Location patient: home Location provider:Home office Persons participating in the virtual visit: patient, provider  I discussed the limitations of evaluation and management by telemedicine and the availability of in person appointments. The patient expressed understanding and agreed to proceed.  Chief Complaint  Patient presents with   Sinus Problem   HPI: Lucas Warren is a 75 yo male with hx of DM II,HTN,CKD III, and seasonal allergies c/o 1 to 2 days of frontal throbbing headache, subjective fever, chills, nasal congestion, rhinorrhea, earache,and productive cough with clearish sputum. He felt "bad" last night, could not sleep well due to headache. Not sure about exacerbating or alleviating factors.  No associated visual changes, MS changes,or focal weakness.  Negative for sore throat, wheezing, dyspnea, CP, palpitations, abdominal pain, nausea, vomiting, changes in bowel habits, urinary symptoms, body aches, or skin rash.  Today he is feeling better.  Clear rhinorrhea is not uncommon, usually exacerbated by eating.  For allergy rhinitis he takes Singulair and Nasacort. No known sick contact. No recent travel. DM2: BS are "good", 80's.  ROS: See pertinent positives and negatives per HPI.  Past Medical History:  Diagnosis Date   Allergy    seasonal   Anxiety    Chest pain, atypical    Colon polyps    hyperplastic 2003   DJD (degenerative joint disease)    DM (diabetes mellitus) ([place])    GERD (gastroesophageal reflux disease)    History of headache    Hypercholesteremia    Psoriasis     Past Surgical History:  Procedure Laterality Date   CATARACT EXTRACTION, BILATERAL Bilateral    Dr. Tommy Rainwater;    [place] CATH AND CORONARY ANGIOGRAPHY N/A  05/20/2020   Procedure: LEFT HEART CATH AND CORONARY ANGIOGRAPHY;  Surgeon: Jettie Booze, MD;  Location: [facility];  Service: Cardiovascular;  Laterality: N/A;   NASAL SEPTUM SURGERY  1989    Family History  Problem Relation Age of Onset   Lung cancer Mother    Alzheimer's disease Father    Heart disease Father    Leukemia Sister     Social History   Socioeconomic History   Marital status: Divorced    Spouse name: carolyn   Number of children: Not on file   Years of education: Not on file   Highest education level: Not on file  Occupational History    Comment: Patent examiner  Tobacco Use   Smoking status: Former    Packs/day: 2.00    Years: 20.00    Pack years: 40.00    Types: Cigarettes    Quit date: 08/31/1981    Years since quitting: 39.7   Smokeless tobacco: Never  Vaping Use   Vaping Use: Never used  Substance and Sexual Activity   Alcohol use: Yes    Comment: rarely a beer   Drug use: No   Sexual activity: Not on file  Other Topics Concern   Not on file  Social History Narrative   Not on file   Social Determinants of Health   Financial Resource Strain: Low Risk    Difficulty of Paying Living Expenses: Not hard at all  Food Insecurity: No Food Insecurity   Worried About [place] in the Last Year: Never  true   Ran Out of Food in the Last Year: Never true  Transportation Needs: No Transportation Needs   Lack of Transportation (Medical): No   Lack of Transportation (Non-Medical): No  Physical Activity: Insufficiently Active   Days of Exercise per Week: 3 days   Minutes of Exercise per Session: 20 min  Stress: No Stress Concern Present   Feeling of Stress : Not at all  Social Connections: Moderately Integrated   Frequency of Communication with Friends and Family: Three times a week   Frequency of Social Gatherings with Friends and Family: Three times a week   Attends Religious Services: 1 to 4 times per year   Active  Member of Clubs or Organizations: Yes   Attends Music therapist: More than 4 times per year   Marital Status: Divorced  Human resources officer Violence: Not At Risk   Fear of Current or Ex-Partner: No   Emotionally Abused: No   Physically Abused: No   Sexually Abused: No    Current Outpatient Medications:    aspirin 81 MG tablet, Take 1 tablet by mouth at bedtime. , Disp: , Rfl:    empagliflozin (JARDIANCE) 10 MG TABS tablet, Take 1 tablet (10 mg total) by mouth daily., Disp: 7 tablet, Rfl: 0   glimepiride (AMARYL) 4 MG tablet, TAKE ONE TABLET BY MOUTH EVERY MORNING, Disp: 90 tablet, Rfl: 3   glucose blood (ONETOUCH ULTRA) test strip, Use one strip twice daily to check blood sugars, Disp: 180 each, Rfl: 3   Lancets (ONETOUCH DELICA PLUS [id number]) MISC, USE 1  TO CHECK GLUCOSE ONCE DAILY, Disp: 100 each, Rfl: 4   Lancets (ONETOUCH DELICA PLUS [id number]) MISC, USE 1  TO CHECK GLUCOSE TWICE DAILY, Disp: 180 each, Rfl: 3   lisinopril (ZESTRIL) 10 MG tablet, TAKE ONE TABLET BY MOUTH EVERY EVENING, Disp: 90 tablet, Rfl: 3   metFORMIN (GLUCOPHAGE) 500 MG tablet, TAKE TWO TABLETS BY MOUTH TWICE DAILY WITH A meal, Disp: 180 tablet, Rfl: 3   montelukast (SINGULAIR) 10 MG tablet, TAKE ONE TABLET BY MOUTH EVERYDAY AT BEDTIME, Disp: 30 tablet, Rfl: 3   nitroGLYCERIN (NITROSTAT) 0.4 MG SL tablet, Take one tablet sublingual as needed for chest pain up to three doses as needed. (Patient taking differently: Place 0.4 mg under the tongue every 5 (five) minutes as needed for chest pain.), Disp: 20 tablet, Rfl: 0   pioglitazone (ACTOS) 45 MG tablet, TAKE ONE TABLET BY MOUTH EVERY MORNING, Disp: 90 tablet, Rfl: 3   rosuvastatin (CRESTOR) 20 MG tablet, Take 1 tablet (20 mg total) by mouth daily., Disp: 90 tablet, Rfl: 3   Semaglutide,0.25 or 0.5MG /DOS, (OZEMPIC, 0.25 OR 0.5 MG/DOSE,) 2 MG/1.5ML SOPN, Start 0.25 mg [place] once weekly for 4 weeks and then increase to 0.5 mg [place] weekly., Disp: 1.5 mL, Rfl: 11    tadalafil (CIALIS) 20 MG tablet, TAKE ONE TABLET BY MOUTH every other DAY AS NEEDED, Disp: 30 tablet, Rfl: 3   vitamin B-12 (CYANOCOBALAMIN) 1000 MCG tablet, Take 1,000 mcg by mouth at bedtime. , Disp: , Rfl:   EXAM:  VITALS per patient if applicable:Ht 6' (0.076 m)   BMI 29.44 kg/m   GENERAL: alert, oriented, appears well and in no acute distress  HEENT: atraumatic, conjunctiva clear, no obvious abnormalities on inspection of external nose and ears No tenderness upon frontal or maxillary sinuses percussion or palpation.  NECK: normal movements of the head and neck No swollen or tender glands noted when he palpates around anterior  and posterior cervical areas.  LUNGS: on inspection no signs of respiratory distress, breathing rate appears normal, no obvious gross SOB, gasping or wheezing. Non productive cough a couple times during visit.  CV: no obvious cyanosis  MS: moves all visible extremities without noticeable abnormality  PSYCH/NEURO: pleasant and cooperative, no obvious depression or anxiety, speech and thought processing grossly intact  ASSESSMENT AND PLAN:  Discussed the following assessment and plan:  Frontal headache Improved. We discussed possible etiologies. Most likely caused by vital infection, so symptomatic treatment recommended at this time. Plenty of po fluids. Monitor for new symptoms and check temp.  Explained that I do not think abx is needed at this time. But if not any better in 4 days (mainly frontal pressure), he was instructed to start oral abx.Some side effects discussed.  Recommend running a home COVID 19 test and to let me know about result.  Tylenol 500 mg 3-4 times per day as needed. He was clearly instructed about warning signs, in which case he needs to seek immediate medical attention.  Rhinitis, unspecified type  This problem can be contributing to frontal sinus pain. Short course of Prednisone may help with sinus/nasal congestion,  recommend starting it if sinus congestion is not any better in 48 hours. We discussed some side effects of Prednisone, instructed to take it with breakfast x 3 days.Monitor BS closely. Atrovent nasal spray added today. Nasal saline irrigations as needed. Continue Nasocort nasal spray and Singulair 10 mg daily.  - ipratropium (ATROVENT) 0.06 % nasal spray; Place 2 sprays into both nostrils 4 (four) times daily.  Dispense: 15 mL; Refill: 12 - predniSONE (DELTASONE) 20 MG tablet; Take 2 tablets (40 mg total) by mouth daily with breakfast for 3 days.  Dispense: 6 tablet; Refill: 0 URI, acute  COVID-19 virus infection Patient called back reporting positive COVID 19 test, so plan changed. Doxycycline prescription cancelled. I called pt to discussed new plan. We discussed Dx,possible complications and treatment options.  He has a mild to moderate case with risk for complications. We discussed oral antiviral options and side effects. Because hx of CKD III, recommend Molnupiravir x 5 days.. Symptomatic treatment with plenty of fluids,and rest. 7 days of quarantine,starting from symptoms onset.  Explained that cough and congestion may last a few more days and even weeks after acute symptoms have resolved. Clearly instructed about warning signs.  - molnupiravir EUA (LAGEVRIO) 200 mg CAPS capsule; Take 4 capsules (800 mg total) by mouth 2 (two) times daily for 5 days.  Dispense: 40 capsule; Refill: 0 - MyChart COVID-19 home monitoring program; Future  I discussed the assessment and treatment plan with the patient. Mr. Warchol was provided an opportunity to ask questions and all were answered. The patient agreed with the plan and demonstrated an understanding of the instructions. He repeated instructions back to me.  Return if symptoms worsen or fail to improve.  Karlisha Mathena Martinique, MD

## 2021-05-28 NOTE — Progress Notes (Signed)
Patient just had visit with PCP. Trying to follow-up with them. Instructed patient to give their office a call. I will send the provider he saw a direct message as well.

## 2021-05-28 NOTE — Addendum Note (Signed)
Addended by: Martinique, Quanesha Klimaszewski G on: 05/28/2021 12:13 PM   Modules accepted: Orders

## 2021-05-30 DIAGNOSIS — E1165 Type 2 diabetes mellitus with hyperglycemia: Secondary | ICD-10-CM | POA: Diagnosis not present

## 2021-05-30 DIAGNOSIS — I10 Essential (primary) hypertension: Secondary | ICD-10-CM

## 2021-06-05 ENCOUNTER — Telehealth: Payer: Self-pay

## 2021-06-05 NOTE — Telephone Encounter (Signed)
Patient called stating he is still having symptoms of Covid and took a home test and the results were positive with a faint line. Pt would like a call back to discuss  more medication if necessary.

## 2021-06-06 ENCOUNTER — Encounter: Payer: Self-pay | Admitting: Family Medicine

## 2021-06-06 ENCOUNTER — Telehealth (INDEPENDENT_AMBULATORY_CARE_PROVIDER_SITE_OTHER): Payer: Medicare Other | Admitting: Family Medicine

## 2021-06-06 DIAGNOSIS — U071 COVID-19: Secondary | ICD-10-CM

## 2021-06-06 NOTE — Telephone Encounter (Signed)
Spoke with the patient. He is aware of Dr. Erick Blinks message. PCP did not have any openings. A virtual visit was scheduled with Dr. Martinique.

## 2021-06-06 NOTE — Progress Notes (Signed)
Virtual Visit via Video Note  I connected with Lucas Warren on 06/06/21 at  2:00 PM EDT by a video enabled telemedicine application 2/2 [id number] pandemic and verified that I am speaking with the correct person using two identifiers.  Location patient: home Location provider:work or home office Persons participating in the virtual visit: patient, provider  I discussed the limitations of evaluation and management by telemedicine and the availability of in person appointments. The patient expressed understanding and agreed to proceed.  Chief Complaint  Patient presents with   Follow-up    Patient took another covid test, and it is showing a faint line that he still has Covid. Only has chest congestion, coughing up phlem, and feeling week. Just wants see if he needs to wait longer because of the last test.    HPI: Pt seen virtually 05/28/21 by Dr. Martinique for cold-like sx 2/2 COVID-19 as patient's PCP was out of office.  Given molnupiravir and prednisone burst.  Pt still having some chest congestion, occasional cough with clear sputum, and nasal drainage.  Was concerned as he re-tested and noticed the test was still faintly positive for COVID.  Denies headache, sore throat, fever, chills, nausea, vomiting, SOB, fatigue.  Has Vicks vapor rub at home but has not used it.  Taking cough drops.  Takes Nasacort and Singulair regularly for allergies.  ROS: See pertinent positives and negatives per HPI.  Past Medical History:  Diagnosis Date   Allergy    seasonal   Anxiety    Chest pain, atypical    Colon polyps    hyperplastic 2003   DJD (degenerative joint disease)    DM (diabetes mellitus) ([place])    GERD (gastroesophageal reflux disease)    History of headache    Hypercholesteremia    Psoriasis     Past Surgical History:  Procedure Laterality Date   CATARACT EXTRACTION, BILATERAL Bilateral    Dr. Tommy Rainwater;    [place] CATH AND CORONARY ANGIOGRAPHY N/A  05/20/2020   Procedure: LEFT HEART CATH AND CORONARY ANGIOGRAPHY;  Surgeon: Jettie Booze, MD;  Location: [facility];  Service: Cardiovascular;  Laterality: N/A;   NASAL SEPTUM SURGERY  1989    Family History  Problem Relation Age of Onset   Lung cancer Mother    Alzheimer's disease Father    Heart disease Father    Leukemia Sister     Current Outpatient Medications:    aspirin 81 MG tablet, Take 1 tablet by mouth at bedtime. , Disp: , Rfl:    empagliflozin (JARDIANCE) 10 MG TABS tablet, Take 1 tablet (10 mg total) by mouth daily., Disp: 7 tablet, Rfl: 0   glimepiride (AMARYL) 4 MG tablet, TAKE ONE TABLET BY MOUTH EVERY MORNING, Disp: 90 tablet, Rfl: 3   glucose blood (ONETOUCH ULTRA) test strip, Use one strip twice daily to check blood sugars, Disp: 180 each, Rfl: 3   ipratropium (ATROVENT) 0.06 % nasal spray, Place 2 sprays into both nostrils 4 (four) times daily., Disp: 15 mL, Rfl: 12   Lancets (ONETOUCH DELICA PLUS [id number]) MISC, USE 1  TO CHECK GLUCOSE ONCE DAILY, Disp: 100 each, Rfl: 4   Lancets (ONETOUCH DELICA PLUS [id number]) MISC, USE 1  TO CHECK GLUCOSE TWICE DAILY, Disp: 180 each, Rfl: 3   lisinopril (ZESTRIL) 10 MG tablet, TAKE ONE TABLET BY MOUTH EVERY EVENING, Disp: 90 tablet, Rfl: 3   metFORMIN (GLUCOPHAGE) 500 MG tablet, TAKE TWO TABLETS BY  MOUTH TWICE DAILY WITH A meal, Disp: 180 tablet, Rfl: 3   montelukast (SINGULAIR) 10 MG tablet, TAKE ONE TABLET BY MOUTH EVERYDAY AT BEDTIME, Disp: 30 tablet, Rfl: 3   nitroGLYCERIN (NITROSTAT) 0.4 MG SL tablet, Take one tablet sublingual as needed for chest pain up to three doses as needed. (Patient taking differently: Place 0.4 mg under the tongue every 5 (five) minutes as needed for chest pain.), Disp: 20 tablet, Rfl: 0   pioglitazone (ACTOS) 45 MG tablet, TAKE ONE TABLET BY MOUTH EVERY MORNING, Disp: 90 tablet, Rfl: 3   rosuvastatin (CRESTOR) 20 MG tablet, Take 1 tablet (20 mg total) by mouth daily., Disp: 90 tablet,  Rfl: 3   Semaglutide,0.25 or 0.5MG /DOS, (OZEMPIC, 0.25 OR 0.5 MG/DOSE,) 2 MG/1.5ML SOPN, Start 0.25 mg [place] once weekly for 4 weeks and then increase to 0.5 mg [place] weekly., Disp: 1.5 mL, Rfl: 11   tadalafil (CIALIS) 20 MG tablet, TAKE ONE TABLET BY MOUTH every other DAY AS NEEDED, Disp: 30 tablet, Rfl: 3   vitamin B-12 (CYANOCOBALAMIN) 1000 MCG tablet, Take 1,000 mcg by mouth at bedtime. , Disp: , Rfl:   EXAM:  VITALS per patient if applicable: RR between 93-26 bpm GENERAL: alert, oriented, appears well and in no acute distress  HEENT: atraumatic, conjunctiva clear, no obvious abnormalities on inspection of external nose and ears  NECK: normal movements of the head and neck  LUNGS: on inspection no signs of respiratory distress, breathing rate appears normal, no obvious gross SOB, gasping or wheezing  CV: no obvious cyanosis  MS: moves all visible extremities without noticeable abnormality  PSYCH/NEURO: pleasant and cooperative, no obvious depression or anxiety, speech and thought processing grossly intact  ASSESSMENT AND PLAN:  Discussed the following assessment and plan:  COVID-19 virus infection -Resolving -Positive test 05/28/2021 s/p Molnupiravir and prednisone burst -For lingering symptoms continue supportive care including drinking warm fluids, steam from shower, OTC cough/cold medication, Vicks vapor rub, nasal spray. -Patient wishes to start cough syrup he has at home. -Given precautions  Follow-up as needed for continued or worsening symptoms   I discussed the assessment and treatment plan with the patient. The patient was provided an opportunity to ask questions and all were answered. The patient agreed with the plan and demonstrated an understanding of the instructions.   The patient was advised to call back or seek an in-person evaluation if the symptoms worsen or if the condition fails to improve as anticipated.  Billie Ruddy, MD

## 2021-06-13 ENCOUNTER — Other Ambulatory Visit: Payer: Self-pay

## 2021-06-13 ENCOUNTER — Ambulatory Visit (INDEPENDENT_AMBULATORY_CARE_PROVIDER_SITE_OTHER): Payer: Medicare Other | Admitting: Family Medicine

## 2021-06-13 VITALS — BP 122/60 | HR 78 | Temp 97.8°F | Wt 211.8 lb

## 2021-06-13 DIAGNOSIS — E1165 Type 2 diabetes mellitus with hyperglycemia: Secondary | ICD-10-CM | POA: Diagnosis not present

## 2021-06-13 DIAGNOSIS — M79652 Pain in left thigh: Secondary | ICD-10-CM

## 2021-06-13 DIAGNOSIS — Z23 Encounter for immunization: Secondary | ICD-10-CM

## 2021-06-13 LAB — POCT GLYCOSYLATED HEMOGLOBIN (HGB A1C): Hemoglobin A1C: 8.7 % — AB (ref 4.0–5.6)

## 2021-06-13 NOTE — Patient Instructions (Signed)
Surprisingly A1C was 8.7% today- and may reflect at least partly recent prednisone and Covid  Increase the Ozempic to 1 mg [place] once weekly.     Let's plan on 3 month follow up.

## 2021-06-13 NOTE — Progress Notes (Signed)
Established Patient Office Visit  Subjective:  Patient ID: Lucas Warren, male    DOB: 11-17-1945  Age: 75 y.o. MRN: [id number]  CC:  Chief Complaint  Patient presents with   Follow-up    Follow up on diabetes, sore spot on the outside of the left leg, know known injury, no visible injury/bruising     HPI Lucas Warren presents for follow-up type 2 diabetes.  This has been suboptimally controlled for some time.  Is on multiple medications.  Recently started on Ozempic currently 0.75 mg once weekly and tolerating well.  Has lost about 6 or 7 pounds since last July.  He is very pleased with this overall.  He did get COVID a few weeks ago and states his blood sugars shot up then.  He apparently was treated with 3 days of prednisone.  He states his blood sugars are just now coming back down.  Fasting this morning 106.  We had recently stopped his Januvia because of the Ozempic.  Compliant with all of his other medications.  We discussed diet in some detail.  He currently frequently eats couple sausage biscuits or muffins for breakfast.  Frequently has a sandwich for lunch.  Complains of about 1 week history of a "sore "area left lower thigh anteriorly.  Denies any injury.  No skin changes.  No visible swelling.  Ambulating without difficulty.  No limp.  Past Medical History:  Diagnosis Date   Allergy    seasonal   Anxiety    Chest pain, atypical    Colon polyps    hyperplastic 2003   DJD (degenerative joint disease)    DM (diabetes mellitus) ([place])    GERD (gastroesophageal reflux disease)    History of headache    Hypercholesteremia    Psoriasis     Past Surgical History:  Procedure Laterality Date   CATARACT EXTRACTION, BILATERAL Bilateral    Dr. Tommy Rainwater;    [place] CATH AND CORONARY ANGIOGRAPHY N/A 05/20/2020   Procedure: LEFT HEART CATH AND CORONARY ANGIOGRAPHY;  Surgeon: Jettie Booze, MD;  Location: [facility];  Service:  Cardiovascular;  Laterality: N/A;   NASAL SEPTUM SURGERY  1989    Family History  Problem Relation Age of Onset   Lung cancer Mother    Alzheimer's disease Father    Heart disease Father    Leukemia Sister     Social History   Socioeconomic History   Marital status: Divorced    Spouse name: carolyn   Number of children: Not on file   Years of education: Not on file   Highest education level: Not on file  Occupational History    Comment: Patent examiner  Tobacco Use   Smoking status: Former    Packs/day: 2.00    Years: 20.00    Pack years: 40.00    Types: Cigarettes    Quit date: 08/31/1981    Years since quitting: 39.8   Smokeless tobacco: Never  Vaping Use   Vaping Use: Never used  Substance and Sexual Activity   Alcohol use: Yes    Comment: rarely a beer   Drug use: No   Sexual activity: Not on file  Other Topics Concern   Not on file  Social History Narrative   Not on file   Social Determinants of Health   Financial Resource Strain: Low Risk    Difficulty of Paying Living Expenses: Not hard at  all  Food Insecurity: No Food Insecurity   Worried About Charity fundraiser in the Last Year: Never true   Ran Out of Food in the Last Year: Never true  Transportation Needs: No Transportation Needs   Lack of Transportation (Medical): No   Lack of Transportation (Non-Medical): No  Physical Activity: Insufficiently Active   Days of Exercise per Week: 3 days   Minutes of Exercise per Session: 20 min  Stress: No Stress Concern Present   Feeling of Stress : Not at all  Social Connections: Moderately Integrated   Frequency of Communication with Friends and Family: Three times a week   Frequency of Social Gatherings with Friends and Family: Three times a week   Attends Religious Services: 1 to 4 times per year   Active Member of Clubs or Organizations: Yes   Attends Music therapist: More than 4 times per year   Marital Status: Divorced  Arboriculturist Violence: Not At Risk   Fear of Current or Ex-Partner: No   Emotionally Abused: No   Physically Abused: No   Sexually Abused: No    Outpatient Medications Prior to Visit  Medication Sig Dispense Refill   aspirin 81 MG tablet Take 1 tablet by mouth at bedtime.      empagliflozin (JARDIANCE) 10 MG TABS tablet Take 1 tablet (10 mg total) by mouth daily. 7 tablet 0   glimepiride (AMARYL) 4 MG tablet TAKE ONE TABLET BY MOUTH EVERY MORNING 90 tablet 3   glucose blood (ONETOUCH ULTRA) test strip Use one strip twice daily to check blood sugars 180 each 3   ipratropium (ATROVENT) 0.06 % nasal spray Place 2 sprays into both nostrils 4 (four) times daily. 15 mL 12   Lancets (ONETOUCH DELICA PLUS [id number]) MISC USE 1  TO CHECK GLUCOSE ONCE DAILY 100 each 4   Lancets (ONETOUCH DELICA PLUS [id number]) MISC USE 1  TO CHECK GLUCOSE TWICE DAILY 180 each 3   lisinopril (ZESTRIL) 10 MG tablet TAKE ONE TABLET BY MOUTH EVERY EVENING 90 tablet 3   metFORMIN (GLUCOPHAGE) 500 MG tablet TAKE TWO TABLETS BY MOUTH TWICE DAILY WITH A meal 180 tablet 3   montelukast (SINGULAIR) 10 MG tablet TAKE ONE TABLET BY MOUTH EVERYDAY AT BEDTIME 30 tablet 3   nitroGLYCERIN (NITROSTAT) 0.4 MG SL tablet Take one tablet sublingual as needed for chest pain up to three doses as needed. (Patient taking differently: Place 0.4 mg under the tongue every 5 (five) minutes as needed for chest pain.) 20 tablet 0   pioglitazone (ACTOS) 45 MG tablet TAKE ONE TABLET BY MOUTH EVERY MORNING 90 tablet 3   rosuvastatin (CRESTOR) 20 MG tablet Take 1 tablet (20 mg total) by mouth daily. 90 tablet 3   Semaglutide,0.25 or 0.5MG /DOS, (OZEMPIC, 0.25 OR 0.5 MG/DOSE,) 2 MG/1.5ML SOPN Start 0.25 mg [place] once weekly for 4 weeks and then increase to 0.5 mg [place] weekly. 1.5 mL 11   tadalafil (CIALIS) 20 MG tablet TAKE ONE TABLET BY MOUTH every other DAY AS NEEDED 30 tablet 3   vitamin B-12 (CYANOCOBALAMIN) 1000 MCG tablet Take 1,000 mcg by mouth at bedtime.       No facility-administered medications prior to visit.    Allergies  Allergen Reactions   Lisinopril Cough   Erythromycin     REACTION: abd pain   Trulicity [Dulaglutide] Nausea Only    ROS Review of Systems  Constitutional:  Negative for fatigue.  Eyes:  Negative for visual disturbance.  Respiratory:  Negative for cough, chest tightness and shortness of breath.   Cardiovascular:  Negative for chest pain, palpitations and leg swelling.  Endocrine: Negative for polydipsia and polyuria.  Neurological:  Negative for dizziness, syncope, weakness, light-headedness and headaches.     Objective:    Physical Exam Vitals reviewed.  Constitutional:      Appearance: Normal appearance.  Cardiovascular:     Rate and Rhythm: Normal rate and regular rhythm.  Pulmonary:     Effort: Pulmonary effort is normal.     Breath sounds: Normal breath sounds.  Musculoskeletal:     Comments: Left thigh examined.  No visible swelling.  No erythema.  No ecchymosis.  No reproducible tenderness to palpation.  No masses palpated.  No pain with knee extension.  Neurological:     Mental Status: He is alert.    BP 122/60 (BP Location: Left Arm, Patient Position: Sitting, Cuff Size: Normal)   Pulse 78   Temp 97.8 F (36.6 C) (Oral)   Wt 211 lb 12.8 oz (96.1 kg)   SpO2 98%   BMI 28.73 kg/m  Wt Readings from Last 3 Encounters:  06/13/21 211 lb 12.8 oz (96.1 kg)  03/10/21 217 lb 1.6 oz (98.5 kg)  12/09/20 220 lb 12.8 oz (100.2 kg)     Health Maintenance Due  Topic Date Due   Zoster Vaccines- Shingrix (1 of 2) Never done   COVID-19 Vaccine (4 - Booster for Pfizer series) 11/06/2020   INFLUENZA VACCINE  03/31/2021    There are no preventive care reminders to display for this patient.  Lab Results  Component Value Date   TSH 0.86 07/24/2015   Lab Results  Component Value Date   WBC 8.3 05/15/2020   HGB 12.9 (L) 05/15/2020   HCT 38.6 05/15/2020   MCV 86 05/15/2020   PLT 227 05/15/2020    Lab Results  Component Value Date   NA 140 06/11/2020   K 4.3 06/11/2020   CO2 23 06/11/2020   GLUCOSE 95 06/11/2020   BUN 28 (H) 06/11/2020   CREATININE 1.62 (H) 06/11/2020   BILITOT 0.4 06/11/2020   ALKPHOS 51 08/07/2019   AST 12 06/11/2020   ALT 8 (L) 06/11/2020   PROT 6.7 06/11/2020   ALBUMIN 4.5 08/07/2019   CALCIUM 9.1 06/11/2020   GFR 42.45 (L) 08/15/2019   Lab Results  Component Value Date   CHOL 127 06/11/2020   Lab Results  Component Value Date   HDL 50 06/11/2020   Lab Results  Component Value Date   LDLCALC 57 06/11/2020   Lab Results  Component Value Date   TRIG 117 06/11/2020   Lab Results  Component Value Date   CHOLHDL 2.5 06/11/2020   Lab Results  Component Value Date   HGBA1C 8.7 (A) 06/13/2021      Assessment & Plan:   #1 type 2 diabetes poorly controlled with A1c 8.7%.  This is somewhat surprising since he recently went on Ozempic and discontinue Januvia.  He has lost some weight.  Did have COVID infection recently was on prednisone but only took this for about 3 days.  -Increase Ozempic further to 1 mg subcutaneous weekly -Discussed diet in some detail.  We strongly advised to get away from muffins and biscuits for breakfast and try to look at alternatives such as eggs -75-month follow-up  #2 soreness left anterior thigh.  Nonfocal exam.  This sounds be more muscular.  Recommend observation for now and if not improving over next  couple weeks be in touch   No orders of the defined types were placed in this encounter.   Follow-up: Return in about 3 months (around 09/13/2021).    [name], MD

## 2021-06-17 ENCOUNTER — Other Ambulatory Visit: Payer: Self-pay | Admitting: Family Medicine

## 2021-06-18 ENCOUNTER — Telehealth: Payer: Self-pay | Admitting: Pharmacist

## 2021-06-18 NOTE — Chronic Care Management (AMB) (Signed)
Chronic Care Management Pharmacy Assistant   Name: Lucas Warren  MRN: [id number] DOB: [birth year]  Reason for Encounter: Medication Review / Medication Coordination   Recent office visits:  06/18/21 [name], MD - Patient presented for Poorly controlled type 2 diabetes mellitus and other concerns. No medication changes.  06/06/21 Billie Ruddy, MD - Patient presented for  COVID -19 infection. No medication changes.  05/28/21 [place], Betty G, MD - Patient presented for COVID-19 virus infection and other concerns. Prescribed Ipratropium Bromide 0.06%, Molnupiravir and Prednisone.   Recent consult visits:  None  Hospital visits:  None in previous 6 months  Medications: Outpatient Encounter Medications as of 06/18/2021  Medication Sig   aspirin 81 MG tablet Take 1 tablet by mouth at bedtime.    empagliflozin (JARDIANCE) 10 MG TABS tablet Take 1 tablet (10 mg total) by mouth daily.   glimepiride (AMARYL) 4 MG tablet TAKE ONE TABLET BY MOUTH EVERY MORNING   glucose blood (ONETOUCH ULTRA) test strip Use one strip twice daily to check blood sugars   ipratropium (ATROVENT) 0.06 % nasal spray Place 2 sprays into both nostrils 4 (four) times daily.   Lancets (ONETOUCH DELICA PLUS [id number]) MISC USE 1  TO CHECK GLUCOSE ONCE DAILY   Lancets (ONETOUCH DELICA PLUS [id number]) MISC USE 1  TO CHECK GLUCOSE TWICE DAILY   lisinopril (ZESTRIL) 10 MG tablet TAKE ONE TABLET BY MOUTH EVERY EVENING   metFORMIN (GLUCOPHAGE) 500 MG tablet TAKE TWO TABLETS BY MOUTH TWICE DAILY WITH A meal   montelukast (SINGULAIR) 10 MG tablet TAKE ONE TABLET BY MOUTH EVERYDAY AT BEDTIME   nitroGLYCERIN (NITROSTAT) 0.4 MG SL tablet Take one tablet sublingual as needed for chest pain up to three doses as needed. (Patient taking differently: Place 0.4 mg under the tongue every 5 (five) minutes as needed for chest pain.)   pioglitazone (ACTOS) 45 MG tablet TAKE ONE TABLET BY MOUTH EVERY MORNING   rosuvastatin  (CRESTOR) 20 MG tablet Take 1 tablet (20 mg total) by mouth daily.   Semaglutide,0.25 or 0.5MG /DOS, (OZEMPIC, 0.25 OR 0.5 MG/DOSE,) 2 MG/1.5ML SOPN Start 0.25 mg [place] once weekly for 4 weeks and then increase to 0.5 mg [place] weekly.   tadalafil (CIALIS) 20 MG tablet TAKE ONE TABLET BY MOUTH every other DAY AS NEEDED   vitamin B-12 (CYANOCOBALAMIN) 1000 MCG tablet Take 1,000 mcg by mouth at bedtime.    No facility-administered encounter medications on file as of 06/18/2021.  Reviewed chart for medication changes ahead of medication coordination call.  No OVs, Consults, or hospital visits since last care coordination call/Pharmacist visit.  No medication changes indicated  BP Readings from Last 3 Encounters:  06/13/21 122/60  03/10/21 118/60  12/09/20 130/70    Lab Results  Component Value Date   HGBA1C 8.7 (A) 06/13/2021     Patient obtains medications through Adherence Packaging  30 Days   Last adherence delivery included:  Tadalafil (Cialis) 20 mg : take one tablet every other day PRN (Vial)  Montelukast (Singulair) 10 mg : one tablet at bedtime Metformin (Glucophage) 500 mg: two tablets at breakfast and two at dinner Pioglitazone (Actos) 45 mg: one tablet at breakfast Glimepiride (Amaryl) 4 mg: one tablet at breakfast Lisinopril (Zestril) 10 mg: one tablet at dinner Empagliflozin (Jardiance) 10 mg: one tablet at breakfast Rosuvastatin 20 mg: one tablet at bedtime     Patient noted he was ok on strips and lancets has enough on hand  Patient is due  for next adherence delivery on: 06/30/21. Called patient and reviewed medications and coordinated delivery. Confirmed Packaging for 30 DS  This delivery to include:  Montelukast (Singulair) 10 mg : one tablet at bedtime Metformin (Glucophage) 500 mg: two tablets at breakfast and two at dinner Pioglitazone (Actos) 45 mg: one tablet at breakfast Glimepiride (Amaryl) 4 mg: one tablet at breakfast Lisinopril (Zestril) 10 mg: one tablet  at dinner Empagliflozin (Jardiance) 10 mg: one tablet at breakfast Rosuvastatin 20 mg: one tablet at bedtime  Patient reports he is ok still with strips and lancets does not need at this time.  Patient declined the following medications (meds) due to (reason) Tadalafil (Cialis) 20 mg : take one tablet every other day PRN (Vial) Patient reports he has plenty on hand still Confirmed delivery date of 06/30/21, advised patient that pharmacy will contact him the morning of delivery.   Care Gaps: Zoster Vaccine - Overdue Covid Booster#4 Therapist, music) - Overdue Flu Vaccine - Overdue Eye Exam - Overdue AWV - Done 8/22 CCM - 1/22 A1C - 8.7 BP- 122/60  Star Rating Drugs: Jardiance 10 mg - Last filed 05/26/21 [facility] Rosuvastatin 20 mg - Last filled 05/26/21 [facility] Lisinopril 10 mg- Last filled 05/27/2021 [facility] Pioglitazone 45 mg - Last filled 05/2621 [facility] Metformin 500 mg - Last filled 9-26/2022 [facility] Ozempic 0.5 mg - not taking   [place] Clinical Pharmacist Assistant [phone]

## 2021-06-23 ENCOUNTER — Other Ambulatory Visit: Payer: Self-pay | Admitting: Interventional Cardiology

## 2021-06-23 ENCOUNTER — Other Ambulatory Visit: Payer: Self-pay | Admitting: Family Medicine

## 2021-07-18 ENCOUNTER — Telehealth: Payer: Self-pay | Admitting: Pharmacist

## 2021-07-18 NOTE — Chronic Care Management (AMB) (Addendum)
Chronic Care Management Pharmacy Assistant   Name: BIJON MINEER  MRN: [id number] DOB: 08-05-1946  Reason for Encounter: Medication Review BP/DM Assessent   Conditions to be addressed/monitored: HTN and DMII  Recent office visits:  None  Recent consult visits:  None  Hospital visits:  None in previous 6 months  Medications: Outpatient Encounter Medications as of 07/18/2021  Medication Sig   aspirin 81 MG tablet Take 1 tablet by mouth at bedtime.    empagliflozin (JARDIANCE) 10 MG TABS tablet Take 1 tablet (10 mg total) by mouth daily.   glimepiride (AMARYL) 4 MG tablet TAKE ONE TABLET BY MOUTH EVERY MORNING   ipratropium (ATROVENT) 0.06 % nasal spray Place 2 sprays into both nostrils 4 (four) times daily.   Lancets (ONETOUCH DELICA PLUS [id number]) MISC USE 1  TO CHECK GLUCOSE ONCE DAILY   Lancets (ONETOUCH DELICA PLUS [id number]) MISC USE ONE TO CHECK GLUCOSE TWICE DAILY   lisinopril (ZESTRIL) 10 MG tablet TAKE ONE TABLET BY MOUTH EVERY EVENING   metFORMIN (GLUCOPHAGE) 500 MG tablet TAKE TWO TABLETS BY MOUTH TWICE DAILY WITH A meal   montelukast (SINGULAIR) 10 MG tablet TAKE ONE TABLET BY MOUTH EVERYDAY AT BEDTIME   nitroGLYCERIN (NITROSTAT) 0.4 MG SL tablet Take one tablet sublingual as needed for chest pain up to three doses as needed. (Patient taking differently: Place 0.4 mg under the tongue every 5 (five) minutes as needed for chest pain.)   ONETOUCH ULTRA test strip USE ONE STRIP TWICE DAILY TO CHECK BLOOD SUGARS   pioglitazone (ACTOS) 45 MG tablet TAKE ONE TABLET BY MOUTH EVERY MORNING   rosuvastatin (CRESTOR) 20 MG tablet TAKE ONE TABLET BY MOUTH EVERYDAY AT BEDTIME   Semaglutide,0.25 or 0.5MG /DOS, (OZEMPIC, 0.25 OR 0.5 MG/DOSE,) 2 MG/1.5ML SOPN Start 0.25 mg [place] once weekly for 4 weeks and then increase to 0.5 mg [place] weekly.   tadalafil (CIALIS) 20 MG tablet TAKE ONE TABLET BY MOUTH every other DAY AS NEEDED   vitamin B-12 (CYANOCOBALAMIN) 1000 MCG tablet Take 1,000  mcg by mouth at bedtime.    No facility-administered encounter medications on file as of 07/18/2021.   Recent Relevant Labs: Lab Results  Component Value Date/Time   HGBA1C 8.7 (A) 06/13/2021 08:34 AM   HGBA1C 7.7 (A) 03/10/2021 09:26 AM   HGBA1C 8.3 (H) 12/25/2015 08:39 AM   HGBA1C 8.4 10/14/2015 12:00 AM   HGBA1C 7.3 (H) 05/22/2015 09:53 AM   MICROALBUR 2.5 06/11/2020 08:23 AM   MICROALBUR 4.3 (H) 01/10/2018 08:54 AM    Kidney Function Lab Results  Component Value Date/Time   CREATININE 1.62 (H) 06/11/2020 08:31 AM   CREATININE 1.82 (H) 05/15/2020 10:45 AM   CREATININE 1.61 (H) 08/15/2019 07:32 AM   GFR 42.45 (L) 08/15/2019 07:32 AM   GFRNONAA 42 (L) 06/11/2020 08:31 AM   GFRAA 48 (L) 06/11/2020 08:31 AM    Current antihyperglycemic regimen:  Glimepiride 4mg , 1 tablet once daily with breakfast Empagliflozin (Jardiance) 10mg , 1 tablet once daily Metformin 500mg , 1 tablets twice daily Pioglitazone (Actos) 45mg , 1 tablet once daily Ozempic, inject 0.5 mg once weekly What recent interventions/DTPs have been made to improve glycemic control:  Patient reports no change Have there been any recent hospitalizations or ED visits since last visit with CPP? No Patient denies hypoglycemic symptoms, including None Patient denies hyperglycemic symptoms, including none How often are you checking your blood sugar? Patent reports he checks once for the most part sometimes 2 times What are your blood sugars ranging?  Fasting: 104 on today During the week, how often does your blood glucose drop below 70? Never  Adherence Review: Is the patient currently on a STATIN medication? Yes Is the patient currently on ACE/ARB medication? No Does the patient have >5 day gap between last estimated fill dates? No   Reviewed chart prior to disease state call. Spoke with patient regarding BP  Recent Office Vitals: BP Readings from Last 3 Encounters:  06/13/21 122/60  03/10/21 118/60  12/09/20  130/70   Pulse Readings from Last 3 Encounters:  06/13/21 78  03/10/21 65  12/09/20 68    Wt Readings from Last 3 Encounters:  06/13/21 211 lb 12.8 oz (96.1 kg)  03/10/21 217 lb 1.6 oz (98.5 kg)  12/09/20 220 lb 12.8 oz (100.2 kg)     Kidney Function Lab Results  Component Value Date/Time   CREATININE 1.62 (H) 06/11/2020 08:31 AM   CREATININE 1.82 (H) 05/15/2020 10:45 AM   CREATININE 1.61 (H) 08/15/2019 07:32 AM   GFR 42.45 (L) 08/15/2019 07:32 AM   GFRNONAA 42 (L) 06/11/2020 08:31 AM   GFRAA 48 (L) 06/11/2020 08:31 AM    BMP Latest Ref Rng & Units 06/11/2020 05/15/2020 08/15/2019  Glucose 65 - 99 mg/dL 95 249(H) 109(H)  BUN 7 - 25 mg/dL 28(H) 25 28(H)  Creatinine 0.70 - 1.18 mg/dL 1.62(H) 1.82(H) 1.61(H)  BUN/Creat Ratio 6 - 22 (calc) 17 14 -  Sodium 135 - 146 mmol/L 140 138 138  Potassium 3.5 - 5.3 mmol/L 4.3 4.7 4.2  Chloride 98 - 110 mmol/L 105 104 107  CO2 20 - 32 mmol/L 23 20 22   Calcium 8.6 - 10.3 mg/dL 9.1 9.2 9.1    Current antihypertensive regimen:  Lisinopril 10mg , 1 tablet once daily How often are you checking your Blood Pressure? Patient reports he does not check his pressures regularly at home. Current home BP readings: Last office reading on 10/14 was 122/60 What recent interventions/DTPs have been made by any provider to improve Blood Pressure control since last CPP Visit: Patient reports no changes Any recent hospitalizations or ED visits since last visit with CPP? No  Adherence Review: Is the patient currently on ACE/ARB medication? No Does the patient have >5 day gap between last estimated fill dates? No   Reviewed chart for medication changes ahead of medication coordination call.  No OVs, Consults, or hospital visits since last care coordination call/Pharmacist visit.   No medication changes indicated BP Readings from Last 3 Encounters:  06/13/21 122/60  03/10/21 118/60  12/09/20 130/70    Lab Results  Component Value Date   HGBA1C 8.7  (A) 06/13/2021     Patient obtains medications through Adherence Packaging  30 Days   Last adherence delivery included:  Montelukast (Singulair) 10 mg : one tablet at bedtime Metformin (Glucophage) 500 mg: two tablets at breakfast and two at dinner Pioglitazone (Actos) 45 mg: one tablet at breakfast Glimepiride (Amaryl) 4 mg: one tablet at breakfast Lisinopril (Zestril) 10 mg: one tablet at dinner Empagliflozin (Jardiance) 10 mg: one tablet at breakfast Rosuvastatin 20 mg: one tablet at bedtime Patient reports he is ok still with strips and lancets does not need at this time.   Patient declined the following medications (meds) due to (reason) Tadalafil (Cialis) 20 mg : take one tablet every other day PRN (Vial) Patient reports he has plenty on hand still  Patient is due for next adherence delivery on: 07/30/21. (Patient requested 12 th will be working on the 30 th)  Called patient and reviewed medications and coordinated delivery. Confirmed Packaging for 30 DS  This delivery to include: Rosuvastatin 20 mg: one tablet at bedtime Montelukast (Singulair) 10 mg : one tablet at bedtime Metformin (Glucophage) 500 mg: two tablets at breakfast and two at dinner Glimepiride (Amaryl) 4 mg: one tablet at breakfast Pioglitazone (Actos) 45 mg: one tablet at breakfast Lisinopril (Zestril) 10 mg: one tablet at dinner Empagliflozin (Jardiance) 10 mg: one tablet at breakfast  Patient declined the following medications (meds) due to (reason) Tadalafil (Cialis) 20 mg : take one tablet every other day PRN  (on hand supply)  Patient needs refills for  Lancets   Confirmed delivery date of 07/29/21, advised patient that pharmacy will contact him the morning of delivery.  Care Gaps: Zoster Vaccine - Overdue Covid Booster#4 Therapist, music) - Overdue Flu Vaccine - Overdue Foot Exam - Overdue AWV - 8/22 CCM - 1/22 BP- 122/60 (06/13/21)   Lab Results  Component Value Date   HGBA1C 8.7 (A) 06/13/2021     Star Rating Drugs: Jardiance 10 mg - Last filed 06/23/21 [facility] Rosuvastatin 20 mg - Last filled 06/23/21 [facility] Lisinopril 10 mg- Last filled 06/23/2021 [facility] Pioglitazone 45 mg - Last filled 06/23/2021 [facility] Metformin 500 mg - Last filled 06/23/2021 [facility] Ozempic 0.5 mg - obtained through [place] Pharmacist Assistant [phone]

## 2021-07-22 NOTE — Progress Notes (Signed)
Ozempic Patient Assistance Application filled out to be mailed to patient for completion.  [place] Clinical Pharmacist Assistant [phone]

## 2021-08-14 IMAGING — DX DG CHEST 2V
2 series · 2 of 2 positions shown · non-contrast
Comparison: 08/19/2016

CLINICAL DATA: 74-year-old male with dyspnea

EXAM:
CHEST - 2 VIEW

[chest pa]
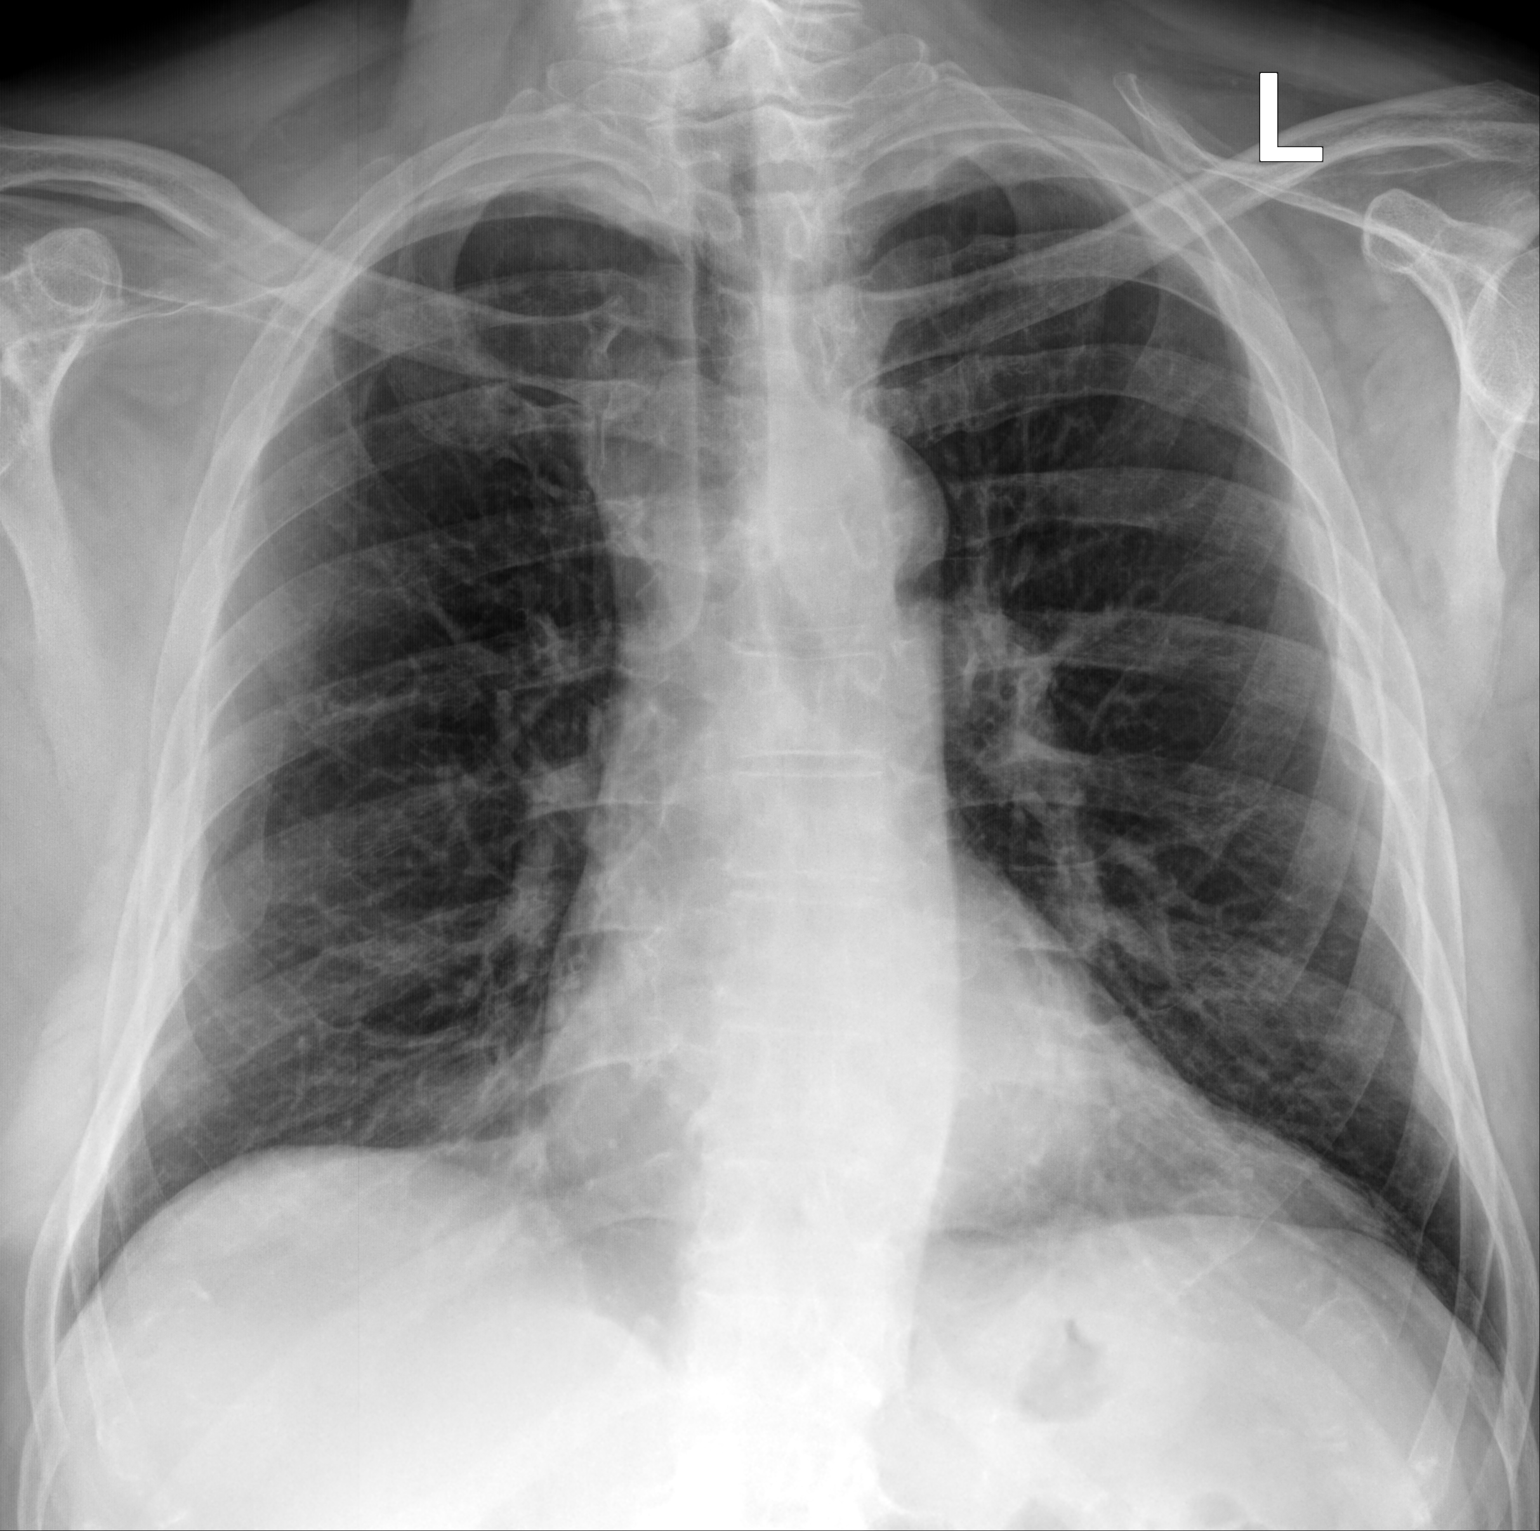

[chest lat]
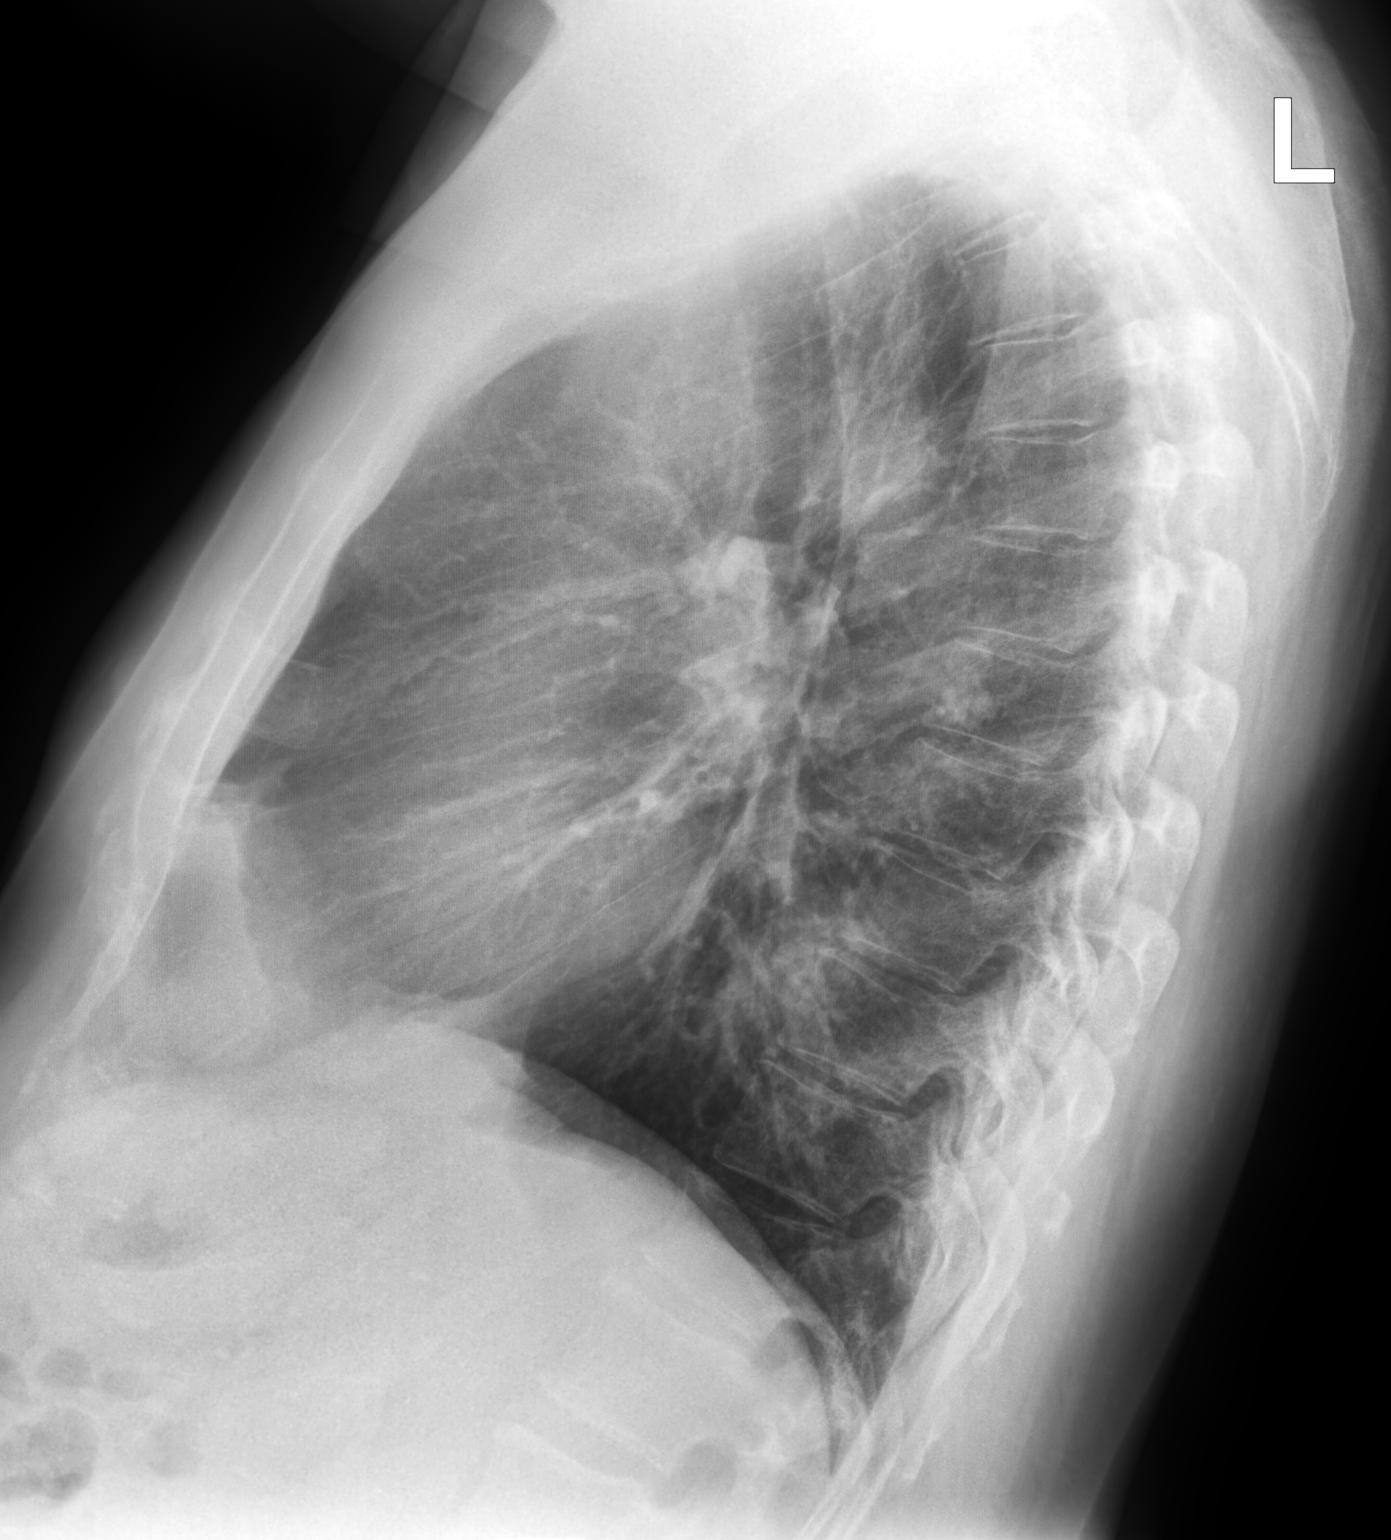

[2 of 2 positions shown; findings below may reference images not displayed]

FINDINGS: Cardiomediastinal silhouette unchanged in size and contour. No
evidence of central vascular congestion. No interlobular septal
thickening. Coarsened interstitial markings similar to the prior. No
confluent airspace disease. No pneumothorax or pleural effusion.

No displaced fracture.  Mild degenerative changes of the spine.
IMPRESSION: Negative for acute cardiopulmonary disease

## 2021-08-15 ENCOUNTER — Telehealth: Payer: Self-pay | Admitting: Pharmacist

## 2021-08-15 NOTE — Chronic Care Management (AMB) (Signed)
Chronic Care Management Pharmacy Assistant   Name: Lucas Warren  MRN: [id number] DOB: Apr 01, 1946   Reason for Encounter: Medication Review   Conditions to be addressed/monitored: Diabetes  Recent office visits:  None  Recent consult visits:  None  Hospital visits:  None in previous 6 months  Medications: Outpatient Encounter Medications as of 08/15/2021  Medication Sig   aspirin 81 MG tablet Take 1 tablet by mouth at bedtime.    empagliflozin (JARDIANCE) 10 MG TABS tablet Take 1 tablet (10 mg total) by mouth daily.   glimepiride (AMARYL) 4 MG tablet TAKE ONE TABLET BY MOUTH EVERY MORNING   ipratropium (ATROVENT) 0.06 % nasal spray Place 2 sprays into both nostrils 4 (four) times daily.   Lancets (ONETOUCH DELICA PLUS [id number]) MISC USE 1  TO CHECK GLUCOSE ONCE DAILY   Lancets (ONETOUCH DELICA PLUS [id number]) MISC USE ONE TO CHECK GLUCOSE TWICE DAILY   lisinopril (ZESTRIL) 10 MG tablet TAKE ONE TABLET BY MOUTH EVERY EVENING   metFORMIN (GLUCOPHAGE) 500 MG tablet TAKE TWO TABLETS BY MOUTH TWICE DAILY WITH A meal   montelukast (SINGULAIR) 10 MG tablet TAKE ONE TABLET BY MOUTH EVERYDAY AT BEDTIME   nitroGLYCERIN (NITROSTAT) 0.4 MG SL tablet Take one tablet sublingual as needed for chest pain up to three doses as needed. (Patient taking differently: Place 0.4 mg under the tongue every 5 (five) minutes as needed for chest pain.)   ONETOUCH ULTRA test strip USE ONE STRIP TWICE DAILY TO CHECK BLOOD SUGARS   pioglitazone (ACTOS) 45 MG tablet TAKE ONE TABLET BY MOUTH EVERY MORNING   rosuvastatin (CRESTOR) 20 MG tablet TAKE ONE TABLET BY MOUTH EVERYDAY AT BEDTIME   Semaglutide,0.25 or 0.5MG /DOS, (OZEMPIC, 0.25 OR 0.5 MG/DOSE,) 2 MG/1.5ML SOPN Start 0.25 mg [place] once weekly for 4 weeks and then increase to 0.5 mg [place] weekly.   tadalafil (CIALIS) 20 MG tablet TAKE ONE TABLET BY MOUTH every other DAY AS NEEDED   vitamin B-12 (CYANOCOBALAMIN) 1000 MCG tablet Take 1,000 mcg by mouth at  bedtime.    No facility-administered encounter medications on file as of 08/15/2021.  Reviewed chart prior to disease state call. Spoke with patient regarding BP  Recent Office Vitals: BP Readings from Last 3 Encounters:  06/13/21 122/60  03/10/21 118/60  12/09/20 130/70   Pulse Readings from Last 3 Encounters:  06/13/21 78  03/10/21 65  12/09/20 68    Wt Readings from Last 3 Encounters:  06/13/21 211 lb 12.8 oz (96.1 kg)  03/10/21 217 lb 1.6 oz (98.5 kg)  12/09/20 220 lb 12.8 oz (100.2 kg)     Kidney Function Lab Results  Component Value Date/Time   CREATININE 1.62 (H) 06/11/2020 08:31 AM   CREATININE 1.82 (H) 05/15/2020 10:45 AM   CREATININE 1.61 (H) 08/15/2019 07:32 AM   GFR 42.45 (L) 08/15/2019 07:32 AM   GFRNONAA 42 (L) 06/11/2020 08:31 AM   GFRAA 48 (L) 06/11/2020 08:31 AM    BMP Latest Ref Rng & Units 06/11/2020 05/15/2020 08/15/2019  Glucose 65 - 99 mg/dL 95 249(H) 109(H)  BUN 7 - 25 mg/dL 28(H) 25 28(H)  Creatinine 0.70 - 1.18 mg/dL 1.62(H) 1.82(H) 1.61(H)  BUN/Creat Ratio 6 - 22 (calc) 17 14 -  Sodium 135 - 146 mmol/L 140 138 138  Potassium 3.5 - 5.3 mmol/L 4.3 4.7 4.2  Chloride 98 - 110 mmol/L 105 104 107  CO2 20 - 32 mmol/L 23 20 22   Calcium 8.6 - 10.3 mg/dL 9.1 9.2 9.1  Current antihypertensive regimen:  Lisinopril 10mg , 1 tablet once daily How often are you checking your Blood Pressure? Patient reports he does not check his pressures regularly at home Current home BP readings:  BP Readings from Last 3 Encounters:  06/13/21 122/60  03/10/21 118/60  12/09/20 130/70   Adherence Review: Is the patient currently on ACE/ARB medication? Yes Does the patient have >5 day gap between last estimated fill dates? No  Recent Relevant Labs: Lab Results  Component Value Date/Time   HGBA1C 8.7 (A) 06/13/2021 08:34 AM   HGBA1C 7.7 (A) 03/10/2021 09:26 AM   HGBA1C 8.3 (H) 12/25/2015 08:39 AM   HGBA1C 8.4 10/14/2015 12:00 AM   HGBA1C 7.3 (H) 05/22/2015  09:53 AM   MICROALBUR 2.5 06/11/2020 08:23 AM   MICROALBUR 4.3 (H) 01/10/2018 08:54 AM    Kidney Function Lab Results  Component Value Date/Time   CREATININE 1.62 (H) 06/11/2020 08:31 AM   CREATININE 1.82 (H) 05/15/2020 10:45 AM   CREATININE 1.61 (H) 08/15/2019 07:32 AM   GFR 42.45 (L) 08/15/2019 07:32 AM   GFRNONAA 42 (L) 06/11/2020 08:31 AM   GFRAA 48 (L) 06/11/2020 08:31 AM    Current antihyperglycemic regimen:  Glimepiride 4mg , 1 tablet once daily with breakfast Empagliflozin (Jardiance) 10mg , 1 tablet once daily Metformin 500mg , 1 tablets twice daily Pioglitazone (Actos) 45mg , 1 tablet once daily Ozempic, inject 0.5 mg once weekly What recent interventions/DTPs have been made to improve glycemic control:  Patient reports no changes Have there been any recent hospitalizations or ED visits since last visit with CPP? No Patient denies hypoglycemic symptoms, including None Patient denies hyperglycemic symptoms, including none How often are you checking your blood sugar? once daily What are your blood sugars ranging?  Fasting: 93 on today  During the week, how often does your blood glucose drop below 70? Never  Adherence Review: Is the patient currently on a STATIN medication? Yes Is the patient currently on ACE/ARB medication? Yes Does the patient have >5 day gap between last estimated fill dates? No  Reviewed chart for medication changes ahead of medication coordination call.  No OVs, Consults, or hospital visits since last care coordination call/Pharmacist visit.  No medication changes indicated BP Readings from Last 3 Encounters:  06/13/21 122/60  03/10/21 118/60  12/09/20 130/70    Lab Results  Component Value Date   HGBA1C 8.7 (A) 06/13/2021     Patient obtains medications through Adherence Packaging  30 Days   Last adherence delivery included:  Rosuvastatin 20 mg: one tablet at bedtime Montelukast (Singulair) 10 mg : one tablet at bedtime Metformin  (Glucophage) 500 mg: two tablets at breakfast and two at dinner Glimepiride (Amaryl) 4 mg: one tablet at breakfast Pioglitazone (Actos) 45 mg: one tablet at breakfast Lisinopril (Zestril) 10 mg: one tablet at dinner Empagliflozin (Jardiance) 10 mg: one tablet at breakfast   Patient declined the following medications (meds) due to (reason) Tadalafil (Cialis) 20 mg : take one tablet every other day PRN  (on hand supply)   Patient needed refills for  Lancets     Patient is due for next adherence delivery on: 08/28/21. Called patient and reviewed medications and coordinated delivery. Confirmed packaging for 30 DS  This delivery to include: Rosuvastatin 20 mg: one tablet at bedtime Montelukast (Singulair) 10 mg : one tablet at bedtime Metformin (Glucophage) 500 mg: two tablets at breakfast and two at dinner Glimepiride (Amaryl) 4 mg: one tablet at breakfast Pioglitazone (Actos) 45 mg: one tablet at breakfast Lisinopril (  Zestril) 10 mg: one tablet at dinner Empagliflozin (Jardiance) 10 mg: one tablet at breakfast Patient requested that we add in the following Tadalafil (Cialis) 20 mg : take one tablet every other day PRN  (on hand supply)  Test Strips   Confirmed delivery date of 08/28/21, advised patient that pharmacy will contact them the morning of delivery.   Care Gaps: Zoster Vaccine - Overdue Covid Booster#4 Therapist, music) - Overdue Flu Vaccine - Overdue Foot Exam - Overdue AWV - 8/22 CCM - 1/22 BP- 122/60 (06/13/21) Lab Results  Component Value Date   HGBA1C 8.7 (A) 06/13/2021    Star Rating Drugs: Jardiance 10 mg - Last filed 11/25//22 [facility] at [facility] Rosuvastatin 20 mg - Last filled 07/25/21 [facility] Lisinopril 10 mg- Last filled 07/25/2021 30 DS at [facility] Pioglitazone 45 mg - Last filled 11/25//2022 [facility] Metformin 500 mg - Last filled 07/25/2021 30 DS at [facility] Ozempic 0.5 mg - obtained through PAP   Patient Assistance: Ozempic -pt  confirmed receipt and that it was returned to prescribing office  [place] Clinical Pharmacist Assistant [phone]

## 2021-08-21 ENCOUNTER — Telehealth: Payer: Medicare Other

## 2021-08-29 ENCOUNTER — Telehealth: Payer: Medicare Other

## 2021-08-29 NOTE — Progress Notes (Signed)
Ozempic PAP renewal approved should receive by 09/23/21 per [facility]   Patient Assistance: Ozempic [facility]) Approved for [place] Pharmacist Assistant [phone]

## 2021-09-05 ENCOUNTER — Telehealth: Payer: Self-pay | Admitting: Pharmacist

## 2021-09-05 NOTE — Chronic Care Management (AMB) (Signed)
° ° °  Chronic Care Management Pharmacy Assistant   Name: Lucas Warren  MRN: [id number] DOB: 1946-08-04  09/05/21 APPOINTMENT REMINDER   Patient was reminded to have all medications, supplements and any blood glucose and blood pressure readings available for review with [name], Pharm. D, for telephone visit on 09/08/21 at 12.   Care Gaps: Zoster Vaccine - Overdue Covid Booster#4 Therapist, music) - Overdue Flu Vaccine - Overdue Foot Exam - Overdue AWV - 8/22 CCM - 1/22 BP- 122/60 (06/13/21) Lab Results  Component Value Date   HGBA1C 8.7 (A) 06/13/2021    Star Rating Drug: Jardiance 10 mg - Last filed 11/25//22 [facility] Rosuvastatin 20 mg - Last filled 07/25/21 30 [facility] Lisinopril 10 mg- Last filled 07/25/2021 30 [facility] Pioglitazone 45 mg - Last filled 11/25//2022 30 [facility] Metformin 500 mg - Last filled 07/25/2021 30 DS at [facility] Ozempic 0.5 mg - obtained through PAP  Any gaps in medications fill history?  None    Medications: Outpatient Encounter Medications as of 09/05/2021  Medication Sig   aspirin 81 MG tablet Take 1 tablet by mouth at bedtime.    empagliflozin (JARDIANCE) 10 MG TABS tablet Take 1 tablet (10 mg total) by mouth daily.   glimepiride (AMARYL) 4 MG tablet TAKE ONE TABLET BY MOUTH EVERY MORNING   ipratropium (ATROVENT) 0.06 % nasal spray Place 2 sprays into both nostrils 4 (four) times daily.   Lancets (ONETOUCH DELICA PLUS [id number]) MISC USE 1  TO CHECK GLUCOSE ONCE DAILY   Lancets (ONETOUCH DELICA PLUS [id number]) MISC USE ONE TO CHECK GLUCOSE TWICE DAILY   lisinopril (ZESTRIL) 10 MG tablet TAKE ONE TABLET BY MOUTH EVERY EVENING   metFORMIN (GLUCOPHAGE) 500 MG tablet TAKE TWO TABLETS BY MOUTH TWICE DAILY WITH A meal   montelukast (SINGULAIR) 10 MG tablet TAKE ONE TABLET BY MOUTH EVERYDAY AT BEDTIME   nitroGLYCERIN (NITROSTAT) 0.4 MG SL tablet Take one tablet sublingual as needed for chest pain up to three doses as  needed. (Patient taking differently: Place 0.4 mg under the tongue every 5 (five) minutes as needed for chest pain.)   ONETOUCH ULTRA test strip USE ONE STRIP TWICE DAILY TO CHECK BLOOD SUGARS   pioglitazone (ACTOS) 45 MG tablet TAKE ONE TABLET BY MOUTH EVERY MORNING   rosuvastatin (CRESTOR) 20 MG tablet TAKE ONE TABLET BY MOUTH EVERYDAY AT BEDTIME   Semaglutide,0.25 or 0.5MG /DOS, (OZEMPIC, 0.25 OR 0.5 MG/DOSE,) 2 MG/1.5ML SOPN Start 0.25 mg [place] once weekly for 4 weeks and then increase to 0.5 mg [place] weekly.   tadalafil (CIALIS) 20 MG tablet TAKE ONE TABLET BY MOUTH every other DAY AS NEEDED   vitamin B-12 (CYANOCOBALAMIN) 1000 MCG tablet Take 1,000 mcg by mouth at bedtime.    No facility-administered encounter medications on file as of 09/05/2021.    Patient Assistance: Ozempic  Renewal 2023 approved  [place] Pharmacist Assistant [phone]

## 2021-09-07 NOTE — Progress Notes (Signed)
Chronic Care Management Pharmacy Note  09/08/2021 Name:  Lucas Warren MRN:  [id number] DOB:  18-Nov-1945  Summary: A1c not at goal < 7% but home blood sugars have improved  Recommendations/Changes made from today's visit: -Recommend alternating times of checking blood sugars 1-2 hours after eating -Recommended restarting fiber tablets  -Could consider decreasing glimepiride to 2 mg depending on A1c -Consider CGM for 2 week period -Recommend repeat lipid panel and BMET  Plan: -Follow up DM assessment in 2 weeks  Subjective: Lucas Warren is an 76 y.o. year old male who is a primary patient of Burchette, Alinda Sierras, MD.  The CCM team was consulted for assistance with disease management and care coordination needs.    Engaged with patient by telephone for follow up visit in response to provider referral for pharmacy case management and/or care coordination services.   Consent to Services:  The patient was given information about Chronic Care Management services, agreed to services, and gave verbal consent prior to initiation of services.  Please see initial visit note for detailed documentation.   Patient Care Team: [name], MD as PCP - General (Family Medicine) Jettie Booze, MD as PCP - Cardiology (Cardiology) Viona Gilmore, [facility] as Pharmacist (Pharmacist)  Recent office visits: 06/18/21 [name], MD - Patient presented for Poorly controlled type 2 diabetes mellitus and other concerns. A1c increased to 8.7%. Increased Ozempic to 1 mg weekly.   06/06/21 Billie Ruddy, MD - Patient presented for  COVID -19 infection. No medication changes.   05/28/21 Martinique, Betty G, MD - Patient presented for COVID-19 virus infection and other concerns. Prescribed Ipratropium Bromide 0.06%, Molnupiravir and Prednisone.   04/29/21 Randel Pigg, LPN: Patient presented for AWV.  Recent consult visits: 08/08/20 05/07/20 [name], MD (cardiology): Patient  presented for follow up.  No changes made.   05/07/20 [name], MD (cardiology): Patient presented with chest pressure and initial work up. Switched pravastatin 40 mg to rosuvastatin 20 mg and started isosorbide mononitrate 30 mg daily.  Hospital visits: None in previous 6 months  Objective:  Lab Results  Component Value Date   CREATININE 1.62 (H) 06/11/2020   BUN 28 (H) 06/11/2020   GFR 42.45 (L) 08/15/2019   GFRNONAA 42 (L) 06/11/2020   GFRAA 48 (L) 06/11/2020   NA 140 06/11/2020   K 4.3 06/11/2020   CALCIUM 9.1 06/11/2020   CO2 23 06/11/2020   GLUCOSE 95 06/11/2020    Lab Results  Component Value Date/Time   HGBA1C 8.7 (A) 06/13/2021 08:34 AM   HGBA1C 7.7 (A) 03/10/2021 09:26 AM   HGBA1C 8.3 (H) 12/25/2015 08:39 AM   HGBA1C 8.4 10/14/2015 12:00 AM   HGBA1C 7.3 (H) 05/22/2015 09:53 AM   GFR 42.45 (L) 08/15/2019 07:32 AM   GFR 45.36 (L) 08/07/2019 08:48 AM   MICROALBUR 2.5 06/11/2020 08:23 AM   MICROALBUR 4.3 (H) 01/10/2018 08:54 AM    Last diabetic Eye exam:  Lab Results  Component Value Date/Time   HMDIABEYEEXA No Retinopathy 04/02/2020 12:00 AM    Last diabetic Foot exam:  Lab Results  Component Value Date/Time   HMDIABFOOTEX normal 03/25/2016 12:00 AM     Lab Results  Component Value Date   CHOL 127 06/11/2020   HDL 50 06/11/2020   LDLCALC 57 06/11/2020   TRIG 117 06/11/2020   CHOLHDL 2.5 06/11/2020    Hepatic Function Latest Ref Rng & Units 06/11/2020 08/07/2019 11/11/2018  Total Protein 6.1 - 8.1 g/dL  6.7 7.0 7.2  Albumin 3.5 - 5.2 g/dL - 4.5 4.6  AST 10 - 35 U/L _0 ALT 9 - 46 U/L 8(L) 7 9  Alk Phosphatase 39 - 117 U/L - 51 58  Total Bilirubin 0.2 - 1.2 mg/dL 0.4 0.5 0.5  Bilirubin, Direct 0.0 - 0.3 mg/dL - 0.1 -    Lab Results  Component Value Date/Time   TSH 0.86 07/24/2015 12:35 PM   TSH 0.71 12/05/2012 10:44 AM    CBC Latest Ref Rng & Units 05/15/2020 11/11/2018 08/19/2016  WBC 3.4 - 10.8 x10E3/uL 8.3 9.1 9.2  Hemoglobin  13.0 - 17.7 g/dL 12.9(L) 13.9 12.6(L)  Hematocrit 37.5 - 51.0 % 38.6 42.0 37.4(L)  Platelets 150 - 450 x10E3/uL 227 222.0 198.0    No results found for: VD25OH  Clinical ASCVD: No  The ASCVD Risk score (Arnett DK, et al., 2019) failed to calculate for the following reasons:   The valid total cholesterol range is 130 to 320 mg/dL    Depression screen [facility] 2/9 06/13/2021 04/29/2021 04/29/2021  Decreased Interest 0 0 0  Down, Depressed, Hopeless 0 0 0  PHQ - 2 Score 0 0 0  Altered sleeping - - -  Tired, decreased energy - - -  Change in appetite - - -  Feeling bad or failure about yourself  - - -  Trouble concentrating - - -  Moving slowly or fidgety/restless - - -  Suicidal thoughts - - -  PHQ-9 Score - - -  Difficult doing work/chores - - -      Social History   Tobacco Use  Smoking Status Former   Packs/day: 2.00   Years: 20.00   Pack years: 40.00   Types: Cigarettes   Quit date: 08/31/1981   Years since quitting: 40.0  Smokeless Tobacco Never   BP Readings from Last 3 Encounters:  06/13/21 122/60  03/10/21 118/60  12/09/20 130/70   Pulse Readings from Last 3 Encounters:  06/13/21 78  03/10/21 65  12/09/20 68   Wt Readings from Last 3 Encounters:  06/13/21 211 lb 12.8 oz (96.1 kg)  03/10/21 217 lb 1.6 oz (98.5 kg)  12/09/20 220 lb 12.8 oz (100.2 kg)   BMI Readings from Last 3 Encounters:  06/13/21 28.73 kg/m  05/28/21 29.44 kg/m  03/10/21 29.44 kg/m    Assessment/Interventions: Review of patient past medical history, allergies, medications, health status, including review of consultants reports, laboratory and other test data, was performed as part of comprehensive evaluation and provision of chronic care management services.   SDOH:  (Social Determinants of Health) assessments and interventions performed: No   CCM Care Plan  Allergies  Allergen Reactions   Lisinopril Cough   Erythromycin     REACTION: abd pain   Trulicity [Dulaglutide] Nausea  Only    Medications Reviewed Today     Reviewed by [name], MD (Physician) on 06/13/21 at [phone]  Med List Status: <None>   Medication Order Taking? Sig Documenting Provider Last Dose Status Informant  aspirin 81 MG tablet [id number] Yes Take 1 tablet by mouth at bedtime.  [provider] Taking Active Self  empagliflozin (JARDIANCE) 10 MG TABS tablet [id number] Yes Take 1 tablet (10 mg total) by mouth daily. [name], MD Taking Active   glimepiride (AMARYL) 4 MG tablet [id number] Yes TAKE ONE TABLET BY MOUTH EVERY MORNING Burchette, Alinda Sierras, MD Taking Active   glucose blood (ONETOUCH ULTRA) test strip [id number] Yes Use one  strip twice daily to check blood sugars Burchette, Alinda Sierras, MD Taking Active   ipratropium (ATROVENT) 0.06 % nasal spray [id number] Yes Place 2 sprays into both nostrils 4 (four) times daily. Martinique, Betty G, MD Taking Active   Lancets (ONETOUCH DELICA PLUS ATFTDD22G) [place] [id number] Yes USE 1  TO CHECK GLUCOSE ONCE DAILY Burchette, Alinda Sierras, MD Taking Active Self  Lancets (ONETOUCH DELICA PLUS [id number]) [place] [id number] Yes USE 1  TO CHECK GLUCOSE TWICE DAILY Burchette, Alinda Sierras, MD Taking Active   lisinopril (ZESTRIL) 10 MG tablet [id number] Yes TAKE ONE TABLET BY MOUTH EVERY EVENING Burchette, Alinda Sierras, MD Taking Active   metFORMIN (GLUCOPHAGE) 500 MG tablet [id number] Yes TAKE TWO TABLETS BY MOUTH TWICE DAILY WITH A meal Burchette, Alinda Sierras, MD Taking Active   montelukast (SINGULAIR) 10 MG tablet [id number] Yes TAKE ONE TABLET BY MOUTH EVERYDAY AT BEDTIME Burchette, Alinda Sierras, MD Taking Active   nitroGLYCERIN (NITROSTAT) 0.4 MG SL tablet [id number] Yes Take one tablet sublingual as needed for chest pain up to three doses as needed.  Patient taking differently: Place 0.4 mg under the tongue every 5 (five) minutes as needed for chest pain.   [name], MD Taking Active   pioglitazone (ACTOS) 45 MG tablet [id number] Yes TAKE ONE TABLET BY MOUTH EVERY  MORNING Burchette, Alinda Sierras, MD Taking Active   rosuvastatin (CRESTOR) 20 MG tablet [id number] Yes Take 1 tablet (20 mg total) by mouth daily. Jettie Booze, MD Taking Active   Semaglutide,0.25 or 0.5MG/DOS, (OZEMPIC, 0.25 OR 0.5 MG/DOSE,) 2 MG/1.5ML SOPN [id number] Yes Start 0.25 mg [place] once weekly for 4 weeks and then increase to 0.5 mg [place] weekly. [name], MD Taking Active   tadalafil (CIALIS) 20 MG tablet [id number] Yes TAKE ONE TABLET BY MOUTH every other DAY AS NEEDED Burchette, Alinda Sierras, MD Taking Active   vitamin B-12 (CYANOCOBALAMIN) 1000 MCG tablet [id number] Yes Take 1,000 mcg by mouth at bedtime.  [provider] Taking Active Self            Patient Active Problem List   Diagnosis Date Noted   Erectile dysfunction 03/11/2020   CKD (chronic kidney disease) stage 3, GFR 30-59 ml/min (HCC) 11/06/2019   Laryngopharyngeal reflux (LPR) 06/12/2019   Perennial allergic rhinitis 06/12/2019   Chronic right shoulder pain 09/24/2016   Cough 08/19/2016   Hemoptysis 08/19/2016   Normocytic anemia 06/29/2016   Hypertension 12/25/2015   Obesity (BMI 30-39.9) 02/02/2014   Acute URI 10/10/2012   Overweight 04/05/2012   Venous insufficiency 04/02/2011   HEADACHE 02/25/2008   CHEST PAIN, ATYPICAL 02/25/2008   HYPERCHOLESTEROLEMIA 02/24/2008   Poorly controlled type 2 diabetes mellitus ([place]) 02/23/2008   ANXIETY 02/23/2008   GERD 02/23/2008   PSORIASIS 02/23/2008   DEGENERATIVE JOINT DISEASE 02/23/2008   COLONIC POLYPS, HX OF 02/23/2008    Immunization History  Administered Date(s) Administered   Fluad Quad(high Dose 65+) 05/09/2019, 06/11/2020   Influenza Split 10/05/2011   Influenza Whole 07/16/2009   Influenza, High Dose Seasonal PF 05/22/2015, 06/29/2016, 06/15/2017, 05/20/2018   Influenza,inj,Quad PF,6+ Mos 07/07/2013, 05/04/2014   PFIZER(Purple Top)SARS-COV-2 Vaccination 10/22/2019, 11/15/2019, 07/09/2020   Pneumococcal Conjugate-13 08/21/2014    Pneumococcal Polysaccharide-23 12/05/2012   Pneumococcal-Unspecified 10/11/2007   Td 03/09/2006   Tdap 03/25/2016   Zoster, Live 03/31/2016   Patient report he doesn't go to the bathroom as often as he was before. He sometimes goes days without having a BM. This has been ongoing for a while. He was  taking fiber tablets at one point and had stopped.  Conditions to be addressed/monitored:  Hypertension, Hyperlipidemia, Diabetes, Chronic Kidney Disease and ED  Conditions addressed this visit: Diabetes, hyperlipidemia  Care Plan : CCM Pharmacy Care Plan  Updates made by Viona Gilmore, [place] since 09/08/2021 12:00 AM     Problem: Problem: Hypertension, Hyperlipidemia, Diabetes, Chronic Kidney Disease and ED      Long-Range Goal: Patient-Specific Goal   Start Date: 11/20/2020  Expected End Date: 11/20/2021  Recent Progress: On track  Priority: High  Note:   Current Barriers:  Unable to independently monitor therapeutic efficacy Unable to achieve control of diabetes   Pharmacist Clinical Goal(s):  Patient will achieve adherence to monitoring guidelines and medication adherence to achieve therapeutic efficacy achieve control of diabetes as evidenced by A1c  through collaboration with PharmD and provider.   Interventions: 1:1 collaboration with [name], MD regarding development and update of comprehensive plan of care as evidenced by provider attestation and co-signature Inter-disciplinary care team collaboration (see longitudinal plan of care) Comprehensive medication review performed; medication list updated in electronic medical record  Hypertension (BP goal <140/90) -Controlled -Current treatment: Lisinopril 463m, 1 tablet once daily - appropriate, effective, safe, accessible -Medications previously tried: isosorbide  -Current home readings: not checking regularly -Current dietary habits: did not discuss -Current exercise habits: did not discuss -Denies  hypotensive/hypertensive symptoms -Educated on Exercise goal of 150 minutes per week; Importance of home blood pressure monitoring; -Counseled to monitor BP at home weekly, document, and provide log at future appointments -Counseled on diet and exercise extensively Recommended to continue current medication  Hyperlipidemia: (LDL goal < 70) -Controlled -Current treatment: Rosuvastatin 215m 1 tablet once daily - appropriate, effective, safe, acessible -Medications previously tried:  rosuvastatin (elevated BGs), simvastatin (myalgias)  -Current dietary patterns: did not discuss -Current exercise habits: did not discuss -Educated on Cholesterol goals;  Importance of limiting foods high in cholesterol; Exercise goal of 150 minutes per week; -Recommended to continue current medication Recommended repeat lipid panel.  Diabetes (A1c goal <7%) -Uncontrolled -Current medications: Glimepiride 63m6m1 tablet once daily with breakfast - appropriate, query effective Empagliflozin (Jardiance) 71m48m tablet once daily - appropriate, query effective  Metformin 500mg363mtablets twice daily - appropriate, query effective Pioglitazone (Actos) 45mg,763mablet once daily - appropriate, query effective Ozempic, inject 1 mg once weekly - appropriate, query effective -Medications previously tried: Onglyza (cost), Januvia (switch to GLP1), Trulicity (nausea)  -Current home glucose readings fasting glucose: 75, 79, 110, 120, 78, 101, 76, 97, 89, 75 (80-120 in the morning, highest was 135 in Dec) post prandial glucose: n/a -Denies hypoglycemic/hyperglycemic symptoms -Current meal patterns:  breakfast: jimmy dean sausage biscuits x 2, blueberry muffin lunch: sandwich, beans, franks  dinner: cheeseburger; apple pie; frozen stuff peppers; [place] mac n'cheese and apples snacks: ice cream sandwiches, klondike bar drinks: n/a -Current exercise: little walking; plans to do more with cooler weather -Educated  on Exercise goal of 150 minutes per week; Benefits of routine self-monitoring of blood sugar; Carbohydrate counting and/or plate method -Counseled to check feet daily and get yearly eye exams -Counseled on diet and exercise extensively Recommended to continue current medication Recommended checking blood sugars 1-2 hours after meals.  CKD (Goal: improve kidney function) -Controlled -Current treatment  Jardiance 10 mg 1 tablet daily - appropriate, effective, safe, accessible -Medications previously tried: none  -Recommended to continue current medication  Primary prevention of ASCVD (Goal: prevent heart attacks and strokes) -Controlled -Current treatment  Aspirin 81 mg 1 tablet daily - appropriate, effective, safe, accessible -Medications previously tried: none  -Counseled on monitoring for bleeding and bruising  ED (Goal: minimize symptoms) -Controlled -Current treatment  Cialis 5 mg 1 tablet as needed - appropriate, effective, safe, accessible -Medications previously tried: sildenafil (no longer effective)  -Recommended to continue current medication   Health Maintenance -Vaccine gaps: shingrix, COVID booster, influenza -Current therapy:  Vitamin B12 1000 mcg 1 tablet daily -Educated on Cost vs benefit of each product must be carefully weighed by individual consumer -Patient is satisfied with current therapy and denies issues -Recommended to continue current medication  Patient Goals/Self-Care Activities Patient will:  - take medications as prescribed check glucose daily, document, and provide at future appointments check blood pressure weekly, document, and provide at future appointments target a minimum of 150 minutes of moderate intensity exercise weekly engage in dietary modifications by eating more non-starchy vegetables than carbs  Follow Up Plan: The care management team will reach out to the patient again over the next 30 days.       Medication Assistance:   Ozempic obtained through [employer] medication assistance program.  Enrollment ends 08/30/22    Compliance/Adherence/Medication fill history: Care Gaps: Shingrix, COVID booster, influenza, Prevnar 20 BP: 122/60 (06/13/21) A1c: 8.7% (06/13/21)  Star-Rating Drugs: Jardiance 10 mg - Last filed 08/21/21 30 DS at [facility] Rosuvastatin 20 mg - Last filled 08/21/21 30 DS at [facility] Lisinopril 10 mg- Last filled 08/21/2021 30 DS at [facility] Pioglitazone 45 mg - Last filled 08/21/2021 30 DS at [facility] Metformin 500 mg - Last filled 08/21/2021 30 DS at [facility] Ozempic 0.5 mg - obtained through PAP  Patient's preferred pharmacy is:  [facility] [place], [place] - [address] [place] [address] Phone: [phone] Fax: [phone]   Uses pill box? No - adherence packaging Pt endorses 100% compliance  We discussed: Benefits of medication synchronization, packaging and delivery as well as enhanced pharmacist oversight with [facility]. Patient decided to: Utilize [facility] pharmacy for medication synchronization, packaging and delivery  Care Plan and Follow Up Patient Decision:  Patient agrees to Care Plan and Follow-up.  Plan: The care management team will reach out to the patient again over the next 30 days.  [name], PharmD [facility] Clinical Pharmacist [place] [facility] [place]

## 2021-09-08 ENCOUNTER — Ambulatory Visit (INDEPENDENT_AMBULATORY_CARE_PROVIDER_SITE_OTHER): Payer: Medicare Other | Admitting: Pharmacist

## 2021-09-08 DIAGNOSIS — I10 Essential (primary) hypertension: Secondary | ICD-10-CM

## 2021-09-08 DIAGNOSIS — E1165 Type 2 diabetes mellitus with hyperglycemia: Secondary | ICD-10-CM

## 2021-09-08 NOTE — Patient Instructions (Signed)
Hi Lucas Warren,  It was great to catch up with you again! Please check your blood sugars 1-2 hours after eating meals to see how these are looking prior to your appointment with Dr. Elease Warren.  Please reach out to me if you have any questions or need anything!  Best, Lucas Warren  Lucas Warren, PharmD, Lucas Warren at [place]  Visit Information   Goals Addressed   None    Patient Care Plan: CCM Pharmacy Care Plan     Problem Identified: Problem: Hypertension, Hyperlipidemia, Diabetes, Chronic Kidney Disease and ED      Long-Range Goal: Patient-Specific Goal   Start Date: 11/20/2020  Expected End Date: 11/20/2021  Recent Progress: On track  Priority: High  Note:   Current Barriers:  Unable to independently monitor therapeutic efficacy Unable to achieve control of diabetes   Pharmacist Clinical Goal(s):  Patient will achieve adherence to monitoring guidelines and medication adherence to achieve therapeutic efficacy achieve control of diabetes as evidenced by A1c  through collaboration with PharmD and provider.   Interventions: 1:1 collaboration with Lucas Post, MD regarding development and update of comprehensive plan of care as evidenced by provider attestation and co-signature Inter-disciplinary care team collaboration (see longitudinal plan of care) Comprehensive medication review performed; medication list updated in electronic medical record  Hypertension (BP goal <140/90) -Controlled -Current treatment: Lisinopril 16m, 1 tablet once daily - appropriate, effective, safe, accessible -Medications previously tried: isosorbide  -Current home readings: not checking regularly -Current dietary habits: did not discuss -Current exercise habits: did not discuss -Denies hypotensive/hypertensive symptoms -Educated on Exercise goal of 150 minutes per week; Importance of home blood pressure monitoring; -Counseled to monitor  BP at home weekly, document, and provide log at future appointments -Counseled on diet and exercise extensively Recommended to continue current medication  Hyperlipidemia: (LDL goal < 70) -Controlled -Current treatment: Rosuvastatin 272m 1 tablet once daily - appropriate, effective, safe, acessible -Medications previously tried:  rosuvastatin (elevated BGs), simvastatin (myalgias)  -Current dietary patterns: did not discuss -Current exercise habits: did not discuss -Educated on Cholesterol goals;  Importance of limiting foods high in cholesterol; Exercise goal of 150 minutes per week; -Recommended to continue current medication Recommended repeat lipid panel.  Diabetes (A1c goal <7%) -Uncontrolled -Current medications: Glimepiride 43m58m1 tablet once daily with breakfast - appropriate, query effective Empagliflozin (Jardiance) 21m41m tablet once daily - appropriate, query effective  Metformin 500mg69mtablets twice daily - appropriate, query effective Pioglitazone (Actos) 45mg,77mablet once daily - appropriate, query effective Ozempic, inject 1 mg once weekly - appropriate, query effective -Medications previously tried: Onglyza (cost), Januvia (switch to GLP1), Trulicity (nausea)  -Current home glucose readings fasting glucose: 75, 79, 110, 120, 78, 101, 76, 97, 89, 75 (80-120 in the morning, highest was 135 in Dec) Warren prandial glucose: n/a -Denies hypoglycemic/hyperglycemic symptoms -Current meal patterns:  breakfast: jimmy dean sausage biscuits x 2, blueberry muffin lunch: sandwich, beans, franks  dinner: cheeseburger; apple pie; frozen stuff peppers; [place] mac n'cheese and apples snacks: ice cream sandwiches, klondike bar drinks: n/a -Current exercise: little walking; plans to do more with cooler weather -Educated on Exercise goal of 150 minutes per week; Benefits of routine self-monitoring of blood sugar; Carbohydrate counting and/or plate method -Counseled to  check feet daily and get yearly eye exams -Counseled on diet and exercise extensively Recommended to continue current medication Recommended checking blood sugars 1-2 hours after meals.  CKD (Goal: improve kidney function) -Controlled -Current treatment  Jardiance  10 mg 1 tablet daily - appropriate, effective, safe, accessible -Medications previously tried: none  -Recommended to continue current medication  Primary prevention of ASCVD (Goal: prevent heart attacks and strokes) -Controlled -Current treatment  Aspirin 81 mg 1 tablet daily - appropriate, effective, safe, accessible -Medications previously tried: none  -Counseled on monitoring for bleeding and bruising  ED (Goal: minimize symptoms) -Controlled -Current treatment  Cialis 5 mg 1 tablet as needed - appropriate, effective, safe, accessible -Medications previously tried: sildenafil (no longer effective)  -Recommended to continue current medication   Health Maintenance -Vaccine gaps: shingrix, COVID booster, influenza -Current therapy:  Vitamin B12 1000 mcg 1 tablet daily -Educated on Cost vs benefit of each product must be carefully weighed by individual consumer -Patient is satisfied with current therapy and denies issues -Recommended to continue current medication  Patient Goals/Self-Care Activities Patient will:  - take medications as prescribed check glucose daily, document, and provide at future appointments check blood pressure weekly, document, and provide at future appointments target a minimum of 150 minutes of moderate intensity exercise weekly engage in dietary modifications by eating more non-starchy vegetables than carbs  Follow Up Plan: The care management team will reach out to the patient again over the next 30 days.        [name] was given information about Chronic Care Management services today including:  CCM service includes personalized support from designated clinical staff supervised  by his physician, including individualized plan of care and coordination with other care providers 24/7 contact phone numbers for assistance for urgent and routine care needs. Standard insurance, coinsurance, copays and deductibles apply for chronic care management only during months in which we provide at least 20 minutes of these services. Most insurances cover these services at 100%, however patients may be responsible for any copay, coinsurance and/or deductible if applicable. This service may help you avoid the need for more expensive face-to-face services. Only one practitioner may furnish and bill the service in a calendar month. The patient may stop CCM services at any time (effective at the end of the month) by phone call to the office staff.  Patient agreed to services and verbal consent obtained.   Patient verbalizes understanding of instructions provided today and agrees to view in [place].  The pharmacy team will reach out to the patient again over the next 30 days.   Viona Gilmore, [facility]

## 2021-09-15 ENCOUNTER — Ambulatory Visit (INDEPENDENT_AMBULATORY_CARE_PROVIDER_SITE_OTHER): Payer: Medicare Other | Admitting: Family Medicine

## 2021-09-15 VITALS — BP 126/60 | HR 73 | Temp 97.7°F | Wt 211.3 lb

## 2021-09-15 DIAGNOSIS — E7800 Pure hypercholesterolemia, unspecified: Secondary | ICD-10-CM | POA: Diagnosis not present

## 2021-09-15 DIAGNOSIS — I10 Essential (primary) hypertension: Secondary | ICD-10-CM | POA: Diagnosis not present

## 2021-09-15 DIAGNOSIS — E1165 Type 2 diabetes mellitus with hyperglycemia: Secondary | ICD-10-CM

## 2021-09-15 LAB — HEPATIC FUNCTION PANEL
ALT: 6 U/L (ref 0–53)
AST: 10 U/L (ref 0–37)
Albumin: 4.3 g/dL (ref 3.5–5.2)
Alkaline Phosphatase: 39 U/L (ref 39–117)
Bilirubin, Direct: 0.1 mg/dL (ref 0.0–0.3)
Total Bilirubin: 0.4 mg/dL (ref 0.2–1.2)
Total Protein: 7.1 g/dL (ref 6.0–8.3)

## 2021-09-15 LAB — BASIC METABOLIC PANEL
BUN: 20 mg/dL (ref 6–23)
CO2: 25 mEq/L (ref 19–32)
Calcium: 9.6 mg/dL (ref 8.4–10.5)
Chloride: 105 mEq/L (ref 96–112)
Creatinine, Ser: 1.47 mg/dL (ref 0.40–1.50)
GFR: 46.97 mL/min — ABNORMAL LOW (ref >60.00)
Glucose, Bld: 125 mg/dL — ABNORMAL HIGH (ref 70–99)
Potassium: 4.8 mEq/L (ref 3.5–5.1)
Sodium: 138 mEq/L (ref 135–145)

## 2021-09-15 LAB — LIPID PANEL
Cholesterol: 109 mg/dL (ref 0–200)
HDL: 50.5 mg/dL (ref >39.00)
LDL Cholesterol: 37 mg/dL (ref 0–99)
NonHDL: 58.77
Total CHOL/HDL Ratio: 2
Triglycerides: 109 mg/dL (ref 0.0–149.0)
VLDL: 21.8 mg/dL (ref 0.0–40.0)

## 2021-09-15 LAB — POCT GLYCOSYLATED HEMOGLOBIN (HGB A1C): Hemoglobin A1C: 7.8 % — AB (ref 4.0–5.6)

## 2021-09-15 NOTE — Patient Instructions (Signed)
A1C today improved from 8.7 to 7.8%.     Better but would like to still see < 7  Tighten up diet and continue weight loss and A1C should improve further  We will call for lab results.

## 2021-09-15 NOTE — Progress Notes (Signed)
Established Patient Office Visit  Subjective:  Patient ID: Lucas Warren, male    DOB: [birth year]  Age: 76 y.o. MRN: [id number]  CC:  Chief Complaint  Patient presents with   Follow-up    HPI Lucas Warren presents for medical follow-up.  He has had poorly controlled diabetes.  He states his blood sugars tend to run up more in the evenings frequently low 200s but fastings are frequently 70s to 80s.  His last A1c was 8.7%.  Recently titrated Ozempic to 1 mg.  Remains on multiple other medications including glimepiride, Actos, Jardiance, metformin.  He is struggling get his weight down.  He feels like he is not in a lot of carbs but does tend to still eat sandwiches or crackers for lunch.  Needs follow-up labs including lipids and chemistries.  His blood pressures have remained very well controlled lisinopril.  Not consistently exercising.  No recent chest pains.  Past Medical History:  Diagnosis Date   Allergy    seasonal   Anxiety    Chest pain, atypical    Colon polyps    hyperplastic 2003   DJD (degenerative joint disease)    DM (diabetes mellitus) ([place])    GERD (gastroesophageal reflux disease)    History of headache    Hypercholesteremia    Psoriasis     Past Surgical History:  Procedure Laterality Date   CATARACT EXTRACTION, BILATERAL Bilateral    Dr. Tommy Rainwater;    [facility] CATH AND CORONARY ANGIOGRAPHY N/A 05/20/2020   Procedure: LEFT HEART CATH AND CORONARY ANGIOGRAPHY;  Surgeon: Jettie Booze, MD;  Location: [facility];  Service: Cardiovascular;  Laterality: N/A;   NASAL SEPTUM SURGERY  1989    Family History  Problem Relation Age of Onset   Lung cancer Mother    Alzheimer's disease Father    Heart disease Father    Leukemia Sister     Social History   Socioeconomic History   Marital status: Divorced    Spouse name: carolyn   Number of children: Not on file   Years of education: Not on file   Highest  education level: Not on file  Occupational History    Comment: Patent examiner  Tobacco Use   Smoking status: Former    Packs/day: 2.00    Years: 20.00    Pack years: 40.00    Types: Cigarettes    Quit date: 08/31/1981    Years since quitting: 40.0   Smokeless tobacco: Never  Vaping Use   Vaping Use: Never used  Substance and Sexual Activity   Alcohol use: Yes    Comment: rarely a beer   Drug use: No   Sexual activity: Not on file  Other Topics Concern   Not on file  Social History Narrative   Not on file   Social Determinants of Health   Financial Resource Strain: Low Risk    Difficulty of Paying Living Expenses: Not hard at all  Food Insecurity: No Food Insecurity   Worried About Charity fundraiser in the Last Year: Never true   [place] in the Last Year: Never true  Transportation Needs: No Transportation Needs   Lack of Transportation (Medical): No   Lack of Transportation (Non-Medical): No  Physical Activity: Insufficiently Active   Days of Exercise per Week: 3 days   Minutes of Exercise per Session: 20 min  Stress: No Stress Concern  Present   Feeling of Stress : Not at all  Social Connections: Moderately Integrated   Frequency of Communication with Friends and Family: Three times a week   Frequency of Social Gatherings with Friends and Family: Three times a week   Attends Religious Services: 1 to 4 times per year   Active Member of Clubs or Organizations: Yes   Attends Music therapist: More than 4 times per year   Marital Status: Divorced  Human resources officer Violence: Not At Risk   Fear of Current or Ex-Partner: No   Emotionally Abused: No   Physically Abused: No   Sexually Abused: No    Outpatient Medications Prior to Visit  Medication Sig Dispense Refill   aspirin 81 MG tablet Take 1 tablet by mouth at bedtime.      empagliflozin (JARDIANCE) 10 MG TABS tablet Take 1 tablet (10 mg total) by mouth daily. 7 tablet 0   glimepiride  (AMARYL) 4 MG tablet TAKE ONE TABLET BY MOUTH EVERY MORNING 90 tablet 3   ipratropium (ATROVENT) 0.06 % nasal spray Place 2 sprays into both nostrils 4 (four) times daily. 15 mL 12   Lancets (ONETOUCH DELICA PLUS [id number]) MISC USE 1  TO CHECK GLUCOSE ONCE DAILY 100 each 4   Lancets (ONETOUCH DELICA PLUS [id number]) MISC USE ONE TO CHECK GLUCOSE TWICE DAILY 180 each 3   lisinopril (ZESTRIL) 10 MG tablet TAKE ONE TABLET BY MOUTH EVERY EVENING 90 tablet 3   metFORMIN (GLUCOPHAGE) 500 MG tablet TAKE TWO TABLETS BY MOUTH TWICE DAILY WITH A meal 180 tablet 3   montelukast (SINGULAIR) 10 MG tablet TAKE ONE TABLET BY MOUTH EVERYDAY AT BEDTIME 30 tablet 3   nitroGLYCERIN (NITROSTAT) 0.4 MG SL tablet Take one tablet sublingual as needed for chest pain up to three doses as needed. (Patient taking differently: Place 0.4 mg under the tongue every 5 (five) minutes as needed for chest pain.) 20 tablet 0   ONETOUCH ULTRA test strip USE ONE STRIP TWICE DAILY TO CHECK BLOOD SUGARS 180 strip 3   pioglitazone (ACTOS) 45 MG tablet TAKE ONE TABLET BY MOUTH EVERY MORNING 90 tablet 3   rosuvastatin (CRESTOR) 20 MG tablet TAKE ONE TABLET BY MOUTH EVERYDAY AT BEDTIME 90 tablet 3   Semaglutide, 1 MG/DOSE, (OZEMPIC, 1 MG/DOSE,) 2 MG/1.5ML SOPN Inject 1 mg into the skin. Take 1 mg [place] once weekly     tadalafil (CIALIS) 20 MG tablet TAKE ONE TABLET BY MOUTH every other DAY AS NEEDED 30 tablet 3   vitamin B-12 (CYANOCOBALAMIN) 1000 MCG tablet Take 1,000 mcg by mouth at bedtime.      Semaglutide,0.25 or 0.5MG /DOS, (OZEMPIC, 0.25 OR 0.5 MG/DOSE,) 2 MG/1.5ML SOPN Start 0.25 mg [place] once weekly for 4 weeks and then increase to 0.5 mg [place] weekly. 1.5 mL 11   No facility-administered medications prior to visit.    Allergies  Allergen Reactions   Lisinopril Cough   Erythromycin     REACTION: abd pain   Trulicity [Dulaglutide] Nausea Only    ROS Review of Systems  Constitutional:  Negative for fatigue and unexpected weight  change.  Eyes:  Negative for visual disturbance.  Respiratory:  Negative for cough, chest tightness and shortness of breath.   Cardiovascular:  Negative for chest pain, palpitations and leg swelling.  Endocrine: Negative for polydipsia and polyuria.  Neurological:  Negative for dizziness, syncope, weakness, light-headedness and headaches.     Objective:    Physical Exam Constitutional:  Appearance: He is well-developed.  HENT:     Right Ear: External ear normal.     Left Ear: External ear normal.  Eyes:     Pupils: Pupils are equal, round, and reactive to light.  Neck:     Thyroid: No thyromegaly.  Cardiovascular:     Rate and Rhythm: Normal rate and regular rhythm.  Pulmonary:     Effort: Pulmonary effort is normal. No respiratory distress.     Breath sounds: Normal breath sounds. No wheezing or rales.  Musculoskeletal:     Cervical back: Neck supple.  Neurological:     Mental Status: He is alert and oriented to person, place, and time.    BP 126/60 (BP Location: Left Arm, Patient Position: Sitting, Cuff Size: Normal)    Pulse 73    Temp 97.7 F (36.5 C) (Oral)    Wt 211 lb 4.8 oz (95.8 kg)    SpO2 98%    BMI 28.66 kg/m  Wt Readings from Last 3 Encounters:  09/15/21 211 lb 4.8 oz (95.8 kg)  06/13/21 211 lb 12.8 oz (96.1 kg)  03/10/21 217 lb 1.6 oz (98.5 kg)     Health Maintenance Due  Topic Date Due   Zoster Vaccines- Shingrix (1 of 2) Never done   Pneumonia Vaccine 91+ Years old (86 - PPSV23 if available, else PCV20) 12/05/2017   FOOT EXAM  05/08/2020   COVID-19 Vaccine (4 - Booster for Pfizer series) 09/03/2020   INFLUENZA VACCINE  03/31/2021    There are no preventive care reminders to display for this patient.  Lab Results  Component Value Date   TSH 0.86 07/24/2015   Lab Results  Component Value Date   WBC 8.3 05/15/2020   HGB 12.9 (L) 05/15/2020   HCT 38.6 05/15/2020   MCV 86 05/15/2020   PLT 227 05/15/2020   Lab Results  Component Value Date    NA 140 06/11/2020   K 4.3 06/11/2020   CO2 23 06/11/2020   GLUCOSE 95 06/11/2020   BUN 28 (H) 06/11/2020   CREATININE 1.62 (H) 06/11/2020   BILITOT 0.4 06/11/2020   ALKPHOS 51 08/07/2019   AST 12 06/11/2020   ALT 8 (L) 06/11/2020   PROT 6.7 06/11/2020   ALBUMIN 4.5 08/07/2019   CALCIUM 9.1 06/11/2020   GFR 42.45 (L) 08/15/2019   Lab Results  Component Value Date   CHOL 127 06/11/2020   Lab Results  Component Value Date   HDL 50 06/11/2020   Lab Results  Component Value Date   LDLCALC 57 06/11/2020   Lab Results  Component Value Date   TRIG 117 06/11/2020   Lab Results  Component Value Date   CHOLHDL 2.5 06/11/2020   Lab Results  Component Value Date   HGBA1C 7.8 (A) 09/15/2021      Assessment & Plan:   Problem List Items Addressed This Visit       Unprioritized   Hypertension   Relevant Orders   Basic metabolic panel   Poorly controlled type 2 diabetes mellitus ([place]) - Primary   Relevant Medications   Semaglutide, 1 MG/DOSE, (OZEMPIC, 1 MG/DOSE,) 2 MG/1.5ML SOPN   Other Relevant Orders   POCT glycosylated hemoglobin (Hb A1C) (Completed)   HYPERCHOLESTEROLEMIA   Relevant Orders   Lipid panel   Hepatic function panel  Patient has had some modest weight loss since starting Ozempic.  A1c is improved to 7.8% but still suboptimal.  -We had a long discussion regarding diet.  Have strongly  suggested he try to move away from carbs sources such as bread and crackers at lunch.  We challenged him to try to get his weight below 200 pounds.  We did mention the fact that his Ozempic could be titrated further but he declines at this time.  We discussed continuous glucose monitoring but his insurance would not cover this.  -Check further labs with lipid, hepatic, basic metabolic panel  -Establish more consistent exercise such as walking  -We would eventually like to scale back his glimepiride to reduce risk of hypoglycemia but he has had no hypoglycemic episodes  recently at this point with minimal improvement in A1c will not make any medication changes  -Set up 33-month follow-up  No orders of the defined types were placed in this encounter.   Follow-up: Return in about 3 months (around 12/14/2021).    [name], MD

## 2021-09-16 ENCOUNTER — Other Ambulatory Visit: Payer: Self-pay | Admitting: Family Medicine

## 2021-09-16 ENCOUNTER — Telehealth: Payer: Self-pay | Admitting: Pharmacist

## 2021-09-16 NOTE — Progress Notes (Signed)
Chronic Care Management Pharmacy Assistant   Name: Lucas Warren  MRN: [id number] DOB: [birth year]  Reason for Encounter: Medication Review Medication Coordination   Conditions to be addressed/monitored: HTN  Recent office visits:  09/08/21 [name], MD - Patient presented for Poorly controlled type 2 diabetes mellitus and other concerns. Decreased Semaglutide to 1 mg weekly  Recent consult visits:  None  Hospital visits:  None in previous 6 months  Medications: Outpatient Encounter Medications as of 09/16/2021  Medication Sig   aspirin 81 MG tablet Take 1 tablet by mouth at bedtime.    glimepiride (AMARYL) 4 MG tablet TAKE ONE TABLET BY MOUTH EVERY MORNING   ipratropium (ATROVENT) 0.06 % nasal spray Place 2 sprays into both nostrils 4 (four) times daily.   JARDIANCE 10 MG TABS tablet TAKE ONE TABLET BY MOUTH EVERY MORNING   Lancets (ONETOUCH DELICA PLUS [id number]) MISC USE 1  TO CHECK GLUCOSE ONCE DAILY   Lancets (ONETOUCH DELICA PLUS [id number]) MISC USE ONE TO CHECK GLUCOSE TWICE DAILY   lisinopril (ZESTRIL) 10 MG tablet TAKE ONE TABLET BY MOUTH EVERY EVENING   metFORMIN (GLUCOPHAGE) 500 MG tablet TAKE TWO TABLETS BY MOUTH TWICE DAILY with a meal   montelukast (SINGULAIR) 10 MG tablet TAKE ONE TABLET BY MOUTH EVERYDAY AT BEDTIME   nitroGLYCERIN (NITROSTAT) 0.4 MG SL tablet Take one tablet sublingual as needed for chest pain up to three doses as needed. (Patient taking differently: Place 0.4 mg under the tongue every 5 (five) minutes as needed for chest pain.)   ONETOUCH ULTRA test strip USE ONE STRIP TWICE DAILY TO CHECK BLOOD SUGARS   pioglitazone (ACTOS) 45 MG tablet TAKE ONE TABLET BY MOUTH EVERY MORNING   rosuvastatin (CRESTOR) 20 MG tablet TAKE ONE TABLET BY MOUTH EVERYDAY AT BEDTIME   Semaglutide, 1 MG/DOSE, (OZEMPIC, 1 MG/DOSE,) 2 MG/1.5ML SOPN Inject 1 mg into the skin. Take 1 mg [place] once weekly   tadalafil (CIALIS) 20 MG tablet TAKE ONE TABLET BY MOUTH every  other DAY AS NEEDED   vitamin B-12 (CYANOCOBALAMIN) 1000 MCG tablet Take 1,000 mcg by mouth at bedtime.    No facility-administered encounter medications on file as of 09/16/2021.   Reviewed chart prior to disease state call. Spoke with patient regarding BP  Recent Office Vitals: BP Readings from Last 3 Encounters:  09/15/21 126/60  06/13/21 122/60  03/10/21 118/60   Pulse Readings from Last 3 Encounters:  09/15/21 73  06/13/21 78  03/10/21 65    Wt Readings from Last 3 Encounters:  09/15/21 211 lb 4.8 oz (95.8 kg)  06/13/21 211 lb 12.8 oz (96.1 kg)  03/10/21 217 lb 1.6 oz (98.5 kg)     Kidney Function Lab Results  Component Value Date/Time   CREATININE 1.47 09/15/2021 08:57 AM   CREATININE 1.62 (H) 06/11/2020 08:31 AM   CREATININE 1.82 (H) 05/15/2020 10:45 AM   GFR 46.97 (L) 09/15/2021 08:57 AM   GFRNONAA 42 (L) 06/11/2020 08:31 AM   GFRAA 48 (L) 06/11/2020 08:31 AM    BMP Latest Ref Rng & Units 09/15/2021 06/11/2020 05/15/2020  Glucose 70 - 99 mg/dL 125(H) 95 249(H)  BUN 6 - 23 mg/dL 20 28(H) 25  Creatinine 0.40 - 1.50 mg/dL 1.47 1.62(H) 1.82(H)  BUN/Creat Ratio 6 - 22 (calc) - 17 14  Sodium 135 - 145 mEq/L 138 140 138  Potassium 3.5 - 5.1 mEq/L 4.8 4.3 4.7  Chloride 96 - 112 mEq/L 105 105 104  CO2 19 -  32 mEq/L 25 23 20   Calcium 8.4 - 10.5 mg/dL 9.6 9.1 9.2    Current antihypertensive regimen:  Lisinopril 10mg , 1 tablet once daily - appropriate, effective, safe, accessible How often are you checking your Blood Pressure? weekly Current home BP readings:  BP Readings from Last 3 Encounters:  09/15/21 126/60  06/13/21 122/60  03/10/21 118/60   What recent interventions/DTPs have been made by any provider to improve Blood Pressure control since last CPP Visit: Patient reports no changes advised he had just seen PCP on yesterday and pressures have been good. Any recent hospitalizations or ED visits since last visit with CPP? No  Adherence Review: Is the patient  currently on ACE/ARB medication? Yes Does the patient have >5 day gap between last estimated fill dates? No  Reviewed chart for medication changes ahead of medication coordination call.  No OVs, Consults, or hospital visits since last care coordination call/Pharmacist visit. (If appropriate, list visit date, provider name)  No medication changes indicated OR if recent visit, treatment plan here.  BP Readings from Last 3 Encounters:  09/15/21 126/60  06/13/21 122/60  03/10/21 118/60    Lab Results  Component Value Date   HGBA1C 7.8 (A) 09/15/2021     Patient obtains medications through Adherence Packaging  30 Days   Last adherence delivery included:   Rosuvastatin 20 mg: one tablet at bedtime Montelukast (Singulair) 10 mg : one tablet at bedtime Metformin (Glucophage) 500 mg: two tablets at breakfast and two at dinner Glimepiride (Amaryl) 4 mg: one tablet at breakfast Pioglitazone (Actos) 45 mg: one tablet at breakfast Lisinopril (Zestril) 10 mg: one tablet at dinner Empagliflozin (Jardiance) 10 mg: one tablet at breakfast Patient requested that we add in the following Tadalafil (Cialis) 20 mg : take one tablet every other day PRN    Test Strips    Patient is due for next adherence delivery on: 09/29/21. Called patient and reviewed medications and coordinated delivery. Confirmed Packs 30 DS  This delivery to include: Test Strips (One Touch) Lancets (One Touch delica) Rosuvastatin 20 mg: one tablet at bedtime Montelukast (Singulair) 10 mg : one tablet at bedtime Metformin (Glucophage) 500 mg: two tablets at breakfast and two at dinner Glimepiride (Amaryl) 4 mg: one tablet at breakfast Pioglitazone (Actos) 45 mg: one tablet at breakfast Lisinopril (Zestril) 10 mg: one tablet at dinner Empagliflozin (Jardiance) 10 mg: one tablet at breakfast  Patient declined the following medications (meds) due to (reason) Tadalafil (Cialis) 20 mg : take one tablet every other day PRN   (on hand supply)   Confirmed delivery date of 09/29/21, advised patient that pharmacy will contact them the morning of delivery.   Care Gaps: BP- 09/15/21 126/60 Zoster Vaccine - Overdue Covid Booster#4 [employer]) - Overdue Flu Vaccine - Overdue Foot Exam - Overdue AWV - 8/22 CCM - 1/22 Lab Results  Component Value Date   HGBA1C 7.8 (A) 09/15/2021    Star Rating Drugs: Jardiance 10 mg - Last filed 08/21/21 30 [facility] at [facility] Rosuvastatin 20 mg - Last filled 08/21/21 [facility] Lisinopril 10 mg- Last filled 08/21/2021 [facility] Pioglitazone 45 mg - Last filled 12/22//2022 [facility] Metformin 500 mg - Last filled 08/21/2021 30 [facility] Ozempic 0.5 mg - obtained through [facility] Pharmacist Assistant [phone]

## 2021-09-30 DIAGNOSIS — E1165 Type 2 diabetes mellitus with hyperglycemia: Secondary | ICD-10-CM

## 2021-09-30 DIAGNOSIS — I10 Essential (primary) hypertension: Secondary | ICD-10-CM | POA: Diagnosis not present

## 2021-10-06 ENCOUNTER — Telehealth: Payer: Self-pay

## 2021-10-06 NOTE — Telephone Encounter (Signed)
Patient assistance 4 boxes of Ozempic came in for the patient. He is aware we have received these and will come to pick them up. They have placed in the samples fridge in the [employer] pod

## 2021-10-17 ENCOUNTER — Telehealth: Payer: Self-pay | Admitting: Pharmacist

## 2021-10-17 NOTE — Chronic Care Management (AMB) (Signed)
Chronic Care Management Pharmacy Assistant   Name: Lucas Warren  MRN: [id number] DOB: 1946/02/01  Reason for Encounter: Medication Review Medication Coordination    Conditions to be addressed/monitored: DMII  Recent office visits:  None  Recent consult visits:  None  Hospital visits:  None in previous 6 months  Medications: Recent Relevant Labs: Lab Results  Component Value Date/Time   HGBA1C 7.8 (A) 09/15/2021 08:45 AM   HGBA1C 8.7 (A) 06/13/2021 08:34 AM   HGBA1C 8.3 (H) 12/25/2015 08:39 AM   HGBA1C 8.4 10/14/2015 12:00 AM   HGBA1C 7.3 (H) 05/22/2015 09:53 AM   MICROALBUR 2.5 06/11/2020 08:23 AM   MICROALBUR 4.3 (H) 01/10/2018 08:54 AM    Kidney Function Lab Results  Component Value Date/Time   CREATININE 1.47 09/15/2021 08:57 AM   CREATININE 1.62 (H) 06/11/2020 08:31 AM   CREATININE 1.82 (H) 05/15/2020 10:45 AM   GFR 46.97 (L) 09/15/2021 08:57 AM   GFRNONAA 42 (L) 06/11/2020 08:31 AM   GFRAA 48 (L) 06/11/2020 08:31 AM    Current antihyperglycemic regimen:  Glimepiride 4mg , 1 tablet once daily with breakfast - appropriate, query effective Empagliflozin (Jardiance) 10mg , 1 tablet once daily - appropriate, query effective  Metformin 500mg , 1 tablets twice daily - appropriate, query effective Pioglitazone (Actos) 45mg , 1 tablet once daily - appropriate, query effective Ozempic, inject 1 mg once weekly - appropriate, query effective What recent interventions/DTPs have been made to improve glycemic control:  Patient reports no changes Have there been any recent hospitalizations or ED visits since last visit with CPP? No Patient denies hypoglycemic symptoms, including None Patient denies hyperglycemic symptoms, including none How often are you checking your blood sugar? twice daily What are your blood sugars ranging?  Fasting: 89, 89, 97, 144 (he had eaten late) During the week, how often does your blood glucose drop below 70? Never   Adherence  Review: Is the patient currently on a STATIN medication? Yes Is the patient currently on ACE/ARB medication? Yes Does the patient have >5 day gap between last estimated fill dates? No   Reviewed chart for medication changes ahead of medication coordination call.  No OVs, Consults, or hospital visits since last care coordination call/Pharmacist visit.   No medication changes indicated.  BP Readings from Last 3 Encounters:  09/15/21 126/60  06/13/21 122/60  03/10/21 118/60    Lab Results  Component Value Date   HGBA1C 7.8 (A) 09/15/2021     Patient obtains medications through Adherence Packaging  30 Days   Last adherence delivery included:   Test Strips (One Touch) Lancets (One Touch delica) Rosuvastatin 20 mg: one tablet at bedtime Montelukast (Singulair) 10 mg : one tablet at bedtime Metformin (Glucophage) 500 mg: two tablets at breakfast and two at dinner Glimepiride (Amaryl) 4 mg: one tablet at breakfast Pioglitazone (Actos) 45 mg: one tablet at breakfast Lisinopril (Zestril) 10 mg: one tablet at dinner Empagliflozin (Jardiance) 10 mg: one tablet at breakfast  Patient declined the following medications (meds) due to (reason) Tadalafil (Cialis) 20 mg : take one tablet every other day PRN  (on hand supply   Patient is due for next adherence delivery on: 10/23/21. Called patient and reviewed medications and coordinated delivery. Confirmed Packaging for 30 DS  This delivery to include: Rosuvastatin 20 mg: one tablet at bedtime Montelukast (Singulair) 10 mg : one tablet at bedtime Metformin (Glucophage) 500 mg: two tablets at breakfast and two at dinner Glimepiride (Amaryl) 4 mg: one tablet at breakfast Pioglitazone (Actos)  45 mg: one tablet at breakfast Empagliflozin (Jardiance) 10 mg: one tablet at breakfast Lisinopril (Zestril) 10 mg: one tablet at dinner  Patient declined the following medications (meds) due to (reason) Tadalafil (Cialis) 20 mg : take one tablet  every other day PRN  (on hand supply   Confirmed delivery date of 10/28/21, advised patient that pharmacy will contact them the morning of delivery.    Care Gaps: BP- 120/60 (09/15/21) AWV- 8/22 PNA Vaccine - Overdue Foot Exam - Overdue COVID Booster - Overdue Flu Vaccine - Overdue Lab Results  Component Value Date   HGBA1C 7.8 (A) 09/15/2021    Star Rating Drugs: Jardiance 10 mg - Last filed 09/22/21 [facility] Rosuvastatin 20 mg - Last filled 09/22/21 [facility] Lisinopril 10 mg- Last filled 09/22/21 30 [facility] Pioglitazone 45 mg - Last filled 09/22/21 30 [facility] Metformin 500 mg - Last filled 09/22/21 30 DS at [facility] Ozempic 0.5 mg - obtained through [facility]    Patient Assistance: Ozempic approved for [place] Pharmacist Assistant [phone]

## 2021-11-17 ENCOUNTER — Telehealth: Payer: Self-pay | Admitting: Pharmacist

## 2021-11-17 NOTE — Chronic Care Management (AMB) (Addendum)
? ? ?Chronic Care Management ?Pharmacy Assistant  ? ?Name: Lucas Warren  MRN: [id number] DOB: 09-03-1945 ? ?Reason for Encounter: Medication Review Medication Coordination ?  ?Conditions to be addressed/monitored: ?HTN ? ?Recent office visits:  ?09/15/21 [name], MD - Patient presented for Poorly controled type 2 diabetes mellitus and other concerns. ? ?Recent consult visits:  ?None ? ?Hospital visits:  ?None in previous 6 months ? ?Medications: ?Outpatient Encounter Medications as of 11/17/2021  ?Medication Sig  ? aspirin 81 MG tablet Take 1 tablet by mouth at bedtime.   ? glimepiride (AMARYL) 4 MG tablet TAKE ONE TABLET BY MOUTH EVERY MORNING  ? ipratropium (ATROVENT) 0.06 % nasal spray Place 2 sprays into both nostrils 4 (four) times daily.  ? JARDIANCE 10 MG TABS tablet TAKE ONE TABLET BY MOUTH EVERY MORNING  ? Lancets (ONETOUCH DELICA PLUS [id number]) MISC USE 1  TO CHECK GLUCOSE ONCE DAILY  ? Lancets (ONETOUCH DELICA PLUS [id number]) MISC USE ONE TO CHECK GLUCOSE TWICE DAILY  ? lisinopril (ZESTRIL) 10 MG tablet TAKE ONE TABLET BY MOUTH EVERY EVENING  ? metFORMIN (GLUCOPHAGE) 500 MG tablet TAKE TWO TABLETS BY MOUTH TWICE DAILY with a meal  ? montelukast (SINGULAIR) 10 MG tablet TAKE ONE TABLET BY MOUTH EVERYDAY AT BEDTIME  ? nitroGLYCERIN (NITROSTAT) 0.4 MG SL tablet Take one tablet sublingual as needed for chest pain up to three doses as needed. (Patient taking differently: Place 0.4 mg under the tongue every 5 (five) minutes as needed for chest pain.)  ? ONETOUCH ULTRA test strip USE ONE STRIP TWICE DAILY TO CHECK BLOOD SUGARS  ? pioglitazone (ACTOS) 45 MG tablet TAKE ONE TABLET BY MOUTH EVERY MORNING  ? rosuvastatin (CRESTOR) 20 MG tablet TAKE ONE TABLET BY MOUTH EVERYDAY AT BEDTIME  ? Semaglutide, 1 MG/DOSE, (OZEMPIC, 1 MG/DOSE,) 2 MG/1.5ML SOPN Inject 1 mg into the skin. Take 1 mg [place] once weekly  ? tadalafil (CIALIS) 20 MG tablet TAKE ONE TABLET BY MOUTH every other DAY AS NEEDED  ? vitamin B-12  (CYANOCOBALAMIN) 1000 MCG tablet Take 1,000 mcg by mouth at bedtime.   ? ?No facility-administered encounter medications on file as of 11/17/2021.  ?Reviewed chart prior to disease state call. Spoke with patient regarding BP ? ?Recent Office Vitals: ?BP Readings from Last 3 Encounters:  ?09/15/21 126/60  ?06/13/21 122/60  ?03/10/21 118/60  ? ?Pulse Readings from Last 3 Encounters:  ?09/15/21 73  ?06/13/21 78  ?03/10/21 65  ?  ?Wt Readings from Last 3 Encounters:  ?09/15/21 211 lb 4.8 oz (95.8 kg)  ?06/13/21 211 lb 12.8 oz (96.1 kg)  ?03/10/21 217 lb 1.6 oz (98.5 kg)  ?  ? ?Kidney Function ?Lab Results  ?Component Value Date/Time  ? CREATININE 1.47 09/15/2021 08:57 AM  ? CREATININE 1.62 (H) 06/11/2020 08:31 AM  ? CREATININE 1.82 (H) 05/15/2020 10:45 AM  ? GFR 46.97 (L) 09/15/2021 08:57 AM  ? GFRNONAA 42 (L) 06/11/2020 08:31 AM  ? GFRAA 48 (L) 06/11/2020 08:31 AM  ? ? ?BMP Latest Ref Rng & Units 09/15/2021 06/11/2020 05/15/2020  ?Glucose 70 - 99 mg/dL 125(H) 95 249(H)  ?BUN 6 - 23 mg/dL 20 28(H) 25  ?Creatinine 0.40 - 1.50 mg/dL 1.47 1.62(H) 1.82(H)  ?BUN/Creat Ratio 6 - 22 (calc) - 17 14  ?Sodium 135 - 145 mEq/L 138 140 138  ?Potassium 3.5 - 5.1 mEq/L 4.8 4.3 4.7  ?Chloride 96 - 112 mEq/L 105 105 104  ?CO2 19 - 32 mEq/L '25 23 20  '$ ?Calcium  8.4 - 10.5 mg/dL 9.6 9.1 9.2  ? ? ?Current antihypertensive regimen:  ?Lisinopril '10mg'$ , 1 tablet once daily - appropriate, effective, safe, accessible ?How often are you checking your Blood Pressure? weekly ?Current home BP readings:  ?BP Readings from Last 3 Encounters:  ?09/15/21 126/60  ?06/13/21 122/60  ?03/10/21 118/60  ?Patient denies any hypo/hypertensive symptoms says he never has trouble with high readings is on this medication as a preventative measure with his diabetes. ?What recent interventions/DTPs have been made by any provider to improve Blood Pressure control since last CPP Visit: Patient reports none ?Any recent hospitalizations or ED visits since last visit with CPP?  No ? ?Adherence Review: ?Is the patient currently on ACE/ARB medication? Yes ?Does the patient have >5 day gap between last estimated fill dates? No ? ? ?Reviewed chart for medication changes ahead of medication coordination call. ? ?No OVs, Consults, or hospital visits since last care coordination call/Pharmacist visit. ? ?No medication changes indicated  ? ?BP Readings from Last 3 Encounters:  ?09/15/21 126/60  ?06/13/21 122/60  ?03/10/21 118/60  ?  ?Lab Results  ?Component Value Date  ? HGBA1C 7.8 (A) 09/15/2021  ?  ? ?Patient obtains medications through Adherence Packaging  30 Days  ? ?Last adherence delivery included:  ?Rosuvastatin 20 mg: one tablet at bedtime ?Montelukast (Singulair) 10 mg : one tablet at bedtime ?Metformin (Glucophage) 500 mg: two tablets at breakfast and two at dinner ?Glimepiride (Amaryl) 4 mg: one tablet at breakfast ?Pioglitazone (Actos) 45 mg: one tablet at breakfast ?Empagliflozin (Jardiance) 10 mg: one tablet at breakfast ?Lisinopril (Zestril) 10 mg: one tablet at dinner ?  ?Patient declined the following medications (meds) due to (reason) ?Tadalafil (Cialis) 20 mg : take one tablet every other day PRN  (on hand supply ? ?Patient is due for next adherence delivery on: 11/27/21. ?Called patient and reviewed medications and coordinated delivery. ?Confirmed Packs for 30 DS ? ?This delivery to include: ?Rosuvastatin 20 mg: one tablet at bedtime ?Metformin (Glucophage) 500 mg: two tablets at breakfast and two at dinner ?Glimepiride (Amaryl) 4 mg: one tablet at breakfast ?Pioglitazone (Actos) 45 mg: one tablet at breakfast ?Empagliflozin (Jardiance) 10 mg: one tablet at breakfast ?Lisinopril (Zestril) 10 mg: one tablet at dinner ? ? ?Patient declined the following medications (meds) due to (reason) ?One Touch Ultra Test Strips ?One Touch Delica Lancets ?Montelukast - pt does not think this helps and PCP is ok with this ? ?Confirmed delivery date of 11/27/21, advised patient that pharmacy will  contact them the morning of delivery.  ? ?Notes: ?Patient reports he does not think the Singulair is working at all for him, he reports that he is also using an OTC allergy medication. He states he goes through a box of kleenex every 2-3 days. [name] advised. ?Per MP advised patient that he may stop the Singulair and try in place of the Zyrtec , Claritin or Allegra and also add Flonase. Patient reports he will switch the pill and he already has some Flonase.  ? ?Care Gaps: ?Zoster Vaccine - Overdue  ?Pna Vaccine - Overdue ?Foot Exam - Overdue ?COVID Booster - Overdue ?Flu Vaccine - Overdue ?BP- 126/60 (09/15/21) ?CCM- 7/23 ?AWV- 8/22 ?Lab Results  ?Component Value Date  ? HGBA1C 7.8 (A) 09/15/2021  ? ? ?Star Rating Drugs: ?Jardiance 10 mg - Last filed 10/21/21 30 DS at [facility] ?Rosuvastatin 20 mg - Last filled 10/21/21 30 DS at [facility] ?Lisinopril 10 mg- Last filled 10/21/21 30 DS at [facility] ?  Pioglitazone 45 mg - Last filled 10/21/21 30 DS at [facility] ?Metformin 500 mg - Last filled 10/21/21 30 DS at [facility] ?Ozempic 0.5 mg - obtained through [facility] ? ? ?Ned Clines CMA ?Clinical Pharmacist Assistant ?[phone] ? ?

## 2021-11-26 ENCOUNTER — Ambulatory Visit (INDEPENDENT_AMBULATORY_CARE_PROVIDER_SITE_OTHER): Payer: Medicare Other | Admitting: Family Medicine

## 2021-11-26 ENCOUNTER — Ambulatory Visit (INDEPENDENT_AMBULATORY_CARE_PROVIDER_SITE_OTHER): Payer: Medicare Other

## 2021-11-26 ENCOUNTER — Other Ambulatory Visit: Payer: Self-pay

## 2021-11-26 ENCOUNTER — Encounter: Payer: Self-pay | Admitting: Family Medicine

## 2021-11-26 VITALS — BP 120/60 | HR 74 | Resp 16 | Ht 72.0 in | Wt 201.2 lb

## 2021-11-26 DIAGNOSIS — R059 Cough, unspecified: Secondary | ICD-10-CM | POA: Diagnosis not present

## 2021-11-26 DIAGNOSIS — R1084 Generalized abdominal pain: Secondary | ICD-10-CM | POA: Diagnosis not present

## 2021-11-26 DIAGNOSIS — R053 Chronic cough: Secondary | ICD-10-CM

## 2021-11-26 DIAGNOSIS — J310 Chronic rhinitis: Secondary | ICD-10-CM | POA: Diagnosis not present

## 2021-11-26 DIAGNOSIS — K5900 Constipation, unspecified: Secondary | ICD-10-CM | POA: Diagnosis not present

## 2021-11-26 LAB — COMPREHENSIVE METABOLIC PANEL
ALT: 9 U/L (ref 0–53)
AST: 11 U/L (ref 0–37)
Albumin: 4.5 g/dL (ref 3.5–5.2)
Alkaline Phosphatase: 44 U/L (ref 39–117)
BUN: 22 mg/dL (ref 6–23)
CO2: 22 mEq/L (ref 19–32)
Calcium: 10 mg/dL (ref 8.4–10.5)
Chloride: 104 mEq/L (ref 96–112)
Creatinine, Ser: 1.61 mg/dL — ABNORMAL HIGH (ref 0.40–1.50)
GFR: 42.05 mL/min — ABNORMAL LOW (ref >60.00)
Glucose, Bld: 241 mg/dL — ABNORMAL HIGH (ref 70–99)
Potassium: 4 mEq/L (ref 3.5–5.1)
Sodium: 139 mEq/L (ref 135–145)
Total Bilirubin: 0.4 mg/dL (ref 0.2–1.2)
Total Protein: 7.2 g/dL (ref 6.0–8.3)

## 2021-11-26 LAB — CBC
HCT: 35.7 % — ABNORMAL LOW (ref 39.0–52.0)
Hemoglobin: 11.8 g/dL — ABNORMAL LOW (ref 13.0–17.0)
MCHC: 33 g/dL (ref 30.0–36.0)
MCV: 86.4 fl (ref 78.0–100.0)
Platelets: 218 10*3/uL (ref 150.0–400.0)
RBC: 4.13 Mil/uL — ABNORMAL LOW (ref 4.22–5.81)
RDW: 15.1 % (ref 11.5–15.5)
WBC: 7.4 10*3/uL (ref 4.0–10.5)

## 2021-11-26 LAB — TSH: TSH: 0.71 u[IU]/mL (ref 0.35–5.50)

## 2021-11-26 MED ORDER — IPRATROPIUM BROMIDE 0.06 % NA SOLN: 2.0000 | Freq: Four times a day (QID) | NASAL | 0 refills | Status: DC

## 2021-11-26 NOTE — Progress Notes (Signed)
? ? ?ACUTE VISIT ?Chief Complaint  ?Patient presents with  ? stomach cramping  ?  Starts from the bottom of the breast bone and goes down; comes & goes and is intense cramping.   ? Constipation  ?  Has not been able to go to the bathroom, tried last night but was unable to go.  ? ?HPI: ?[name] is a 76 y.o. male with hx of DM II, HLD,GERD,and CKD here today complaining of 3 days of abdominal pain as described above. ?Generalized abdominal pain. ?It lasts a few minutes at the time. ?He has not identified exacerbating factors, it is not worse or better with food intake. ?No recent travel or sick contact. ? ?Having bowel movements every 3-4 days for the past 6-8 months. ?Last bowel movement 4 days ago. ?He is passing gas. ? ?Abdominal Pain ?This is a new problem. The current episode started in the past 7 days. The onset quality is sudden. The problem occurs intermittently. The problem has been gradually improving. The pain is located in the generalized abdominal region. The abdominal pain does not radiate. Associated symptoms include belching and constipation. Pertinent negatives include no diarrhea, dysuria, fever, frequency, headaches, hematochezia, hematuria, melena, myalgias, vomiting or weight loss. The pain is relieved by Passing flatus and belching. There is no history of abdominal surgery.  ?Burping and passing gas help some. ?He has some heartburn and nausea last night, took Tums. ?+ Fatigue since COVID 19 infection, 05/2021. ? ?He is trying to eat healthier, has decreased portion size to better control his glucose. ? ?He is also concerned about productive cough for over 4 weeks. ?Negative for wheezing,SOB,or CP. ?He would like imaging done. ? ?Rhinorrhea, worse when eating. Chronic and worse for the past 2 years. ?Flonase nasal spray and Nasacort caused nose bleed. ? ?Review of Systems  ?Constitutional:  Negative for activity change, appetite change, chills, fever and weight loss.  ?HENT:  Positive  for postnasal drip. Negative for mouth sores and trouble swallowing.   ?Gastrointestinal:  Positive for abdominal pain and constipation. Negative for diarrhea, hematochezia, melena and vomiting.  ?Endocrine: Negative for cold intolerance and heat intolerance.  ?Genitourinary:  Negative for dysuria, frequency and hematuria.  ?Musculoskeletal:  Negative for gait problem and myalgias.  ?Skin:  Negative for rash.  ?Neurological:  Negative for headaches.  ?Hematological:  Negative for adenopathy. Does not bruise/bleed easily.  ?Rest see pertinent positives and negatives per HPI. ? ?Current Outpatient Medications on File Prior to Visit  ?Medication Sig Dispense Refill  ? aspirin 81 MG tablet Take 1 tablet by mouth at bedtime.     ? glimepiride (AMARYL) 4 MG tablet TAKE ONE TABLET BY MOUTH EVERY MORNING 90 tablet 3  ? JARDIANCE 10 MG TABS tablet TAKE ONE TABLET BY MOUTH EVERY MORNING 90 tablet 3  ? Lancets (ONETOUCH DELICA PLUS [id number]) MISC USE 1  TO CHECK GLUCOSE ONCE DAILY 100 each 4  ? Lancets (ONETOUCH DELICA PLUS [id number]) MISC USE ONE TO CHECK GLUCOSE TWICE DAILY 180 each 3  ? lisinopril (ZESTRIL) 10 MG tablet TAKE ONE TABLET BY MOUTH EVERY EVENING 90 tablet 3  ? metFORMIN (GLUCOPHAGE) 500 MG tablet TAKE TWO TABLETS BY MOUTH TWICE DAILY with a meal 180 tablet 3  ? nitroGLYCERIN (NITROSTAT) 0.4 MG SL tablet Take one tablet sublingual as needed for chest pain up to three doses as needed. (Patient taking differently: Place 0.4 mg under the tongue every 5 (five) minutes as needed for chest pain.) 20 tablet  0  ? ONETOUCH ULTRA test strip USE ONE STRIP TWICE DAILY TO CHECK BLOOD SUGARS 180 strip 3  ? pioglitazone (ACTOS) 45 MG tablet TAKE ONE TABLET BY MOUTH EVERY MORNING 90 tablet 3  ? rosuvastatin (CRESTOR) 20 MG tablet TAKE ONE TABLET BY MOUTH EVERYDAY AT BEDTIME 90 tablet 3  ? Semaglutide, 1 MG/DOSE, (OZEMPIC, 1 MG/DOSE,) 2 MG/1.5ML SOPN Inject 1 mg into the skin. Take 1 mg [place] once weekly    ? tadalafil (CIALIS) 20  MG tablet TAKE ONE TABLET BY MOUTH every other DAY AS NEEDED 30 tablet 3  ? vitamin B-12 (CYANOCOBALAMIN) 1000 MCG tablet Take 1,000 mcg by mouth at bedtime.     ? ?No current facility-administered medications on file prior to visit.  ? ?Past Medical History:  ?Diagnosis Date  ? Allergy   ? seasonal  ? Anxiety   ? Chest pain, atypical   ? Colon polyps   ? hyperplastic 2003  ? DJD (degenerative joint disease)   ? DM (diabetes mellitus) ([place])   ? GERD (gastroesophageal reflux disease)   ? History of headache   ? Hypercholesteremia   ? Psoriasis   ? ?Allergies  ?Allergen Reactions  ? Lisinopril Cough  ? Erythromycin   ?  REACTION: abd pain  ? Trulicity [Dulaglutide] Nausea Only  ? ? ?Social History  ? ?Socioeconomic History  ? Marital status: Divorced  ?  Spouse name: carolyn  ? Number of children: Not on file  ? Years of education: Not on file  ? Highest education level: Not on file  ?Occupational History  ?  Comment: Patent examiner  ?Tobacco Use  ? Smoking status: Former  ?  Packs/day: 2.00  ?  Years: 20.00  ?  Pack years: 40.00  ?  Types: Cigarettes  ?  Quit date: 08/31/1981  ?  Years since quitting: 40.2  ? Smokeless tobacco: Never  ?Vaping Use  ? Vaping Use: Never used  ?Substance and Sexual Activity  ? Alcohol use: Yes  ?  Comment: rarely a beer  ? Drug use: No  ? Sexual activity: Not on file  ?Other Topics Concern  ? Not on file  ?Social History Narrative  ? Not on file  ? ?Social Determinants of Health  ? ?Financial Resource Strain: Low Risk   ? Difficulty of Paying Living Expenses: Not hard at all  ?Food Insecurity: No Food Insecurity  ? Worried About Charity fundraiser in the Last Year: Never true  ? Ran Out of Food in the Last Year: Never true  ?Transportation Needs: No Transportation Needs  ? Lack of Transportation (Medical): No  ? Lack of Transportation (Non-Medical): No  ?Physical Activity: Insufficiently Active  ? Days of Exercise per Week: 3 days  ? Minutes of Exercise per Session: 20 min   ?Stress: No Stress Concern Present  ? Feeling of Stress : Not at all  ?Social Connections: Moderately Integrated  ? Frequency of Communication with Friends and Family: Three times a week  ? Frequency of Social Gatherings with Friends and Family: Three times a week  ? Attends Religious Services: 1 to 4 times per year  ? Active Member of Clubs or Organizations: Yes  ? Attends Archivist Meetings: More than 4 times per year  ? Marital Status: Divorced  ? ? ?Vitals:  ? 11/26/21 1025  ?BP: 120/60  ?Pulse: 74  ?Resp: 16  ?SpO2: 99%  ? ?Body mass index is 27.29 kg/m?. ? ?Physical Exam ?Vitals  and nursing note reviewed.  ?Constitutional:   ?   General: He is not in acute distress. ?   Appearance: He is well-developed.  ?HENT:  ?   Head: Normocephalic and atraumatic.  ?   Nose: No rhinorrhea.  ?   Right Turbinates: Not enlarged.  ?   Left Turbinates: Not enlarged.  ?   Mouth/Throat:  ?   Mouth: Mucous membranes are moist.  ?   Pharynx: Oropharynx is clear.  ?Eyes:  ?   Conjunctiva/sclera: Conjunctivae normal.  ?Cardiovascular:  ?   Rate and Rhythm: Normal rate and regular rhythm.  ?   Heart sounds: No murmur heard. ?   Comments: Trace pitting LE edema, bilateral. ?Pulmonary:  ?   Effort: Pulmonary effort is normal. No respiratory distress.  ?   Breath sounds: Normal breath sounds.  ?Abdominal:  ?   Palpations: Abdomen is soft. There is no hepatomegaly or mass.  ?   Tenderness: There is no abdominal tenderness.  ?Lymphadenopathy:  ?   Cervical: No cervical adenopathy.  ?Skin: ?   General: Skin is warm.  ?   Findings: No erythema or rash.  ?Neurological:  ?   Mental Status: He is alert and oriented to person, place, and time.  ?   Cranial Nerves: No cranial nerve deficit.  ?   Comments: Stable gait, not assisted.  ?Psychiatric:     ?   Mood and Affect: Mood is anxious.  ?   Comments: Well groomed, good eye contact.  ? ?ASSESSMENT AND PLAN: ? ?[name] was seen today for stomach cramping and  constipation. ? ?Diagnoses and all orders for this visit: ?Orders Placed This Encounter  ?Procedures  ? DG Chest 2 View  ? CBC  ? Comprehensive metabolic panel  ? TSH  ? ?Lab Results  ?Component Value Date  ? TSH 0.71 11/26/2021

## 2021-11-26 NOTE — Patient Instructions (Signed)
A few things to remember from today's visit: ? ? ?Rhinitis, unspecified type - Plan: ipratropium (ATROVENT) 0.06 % nasal spray ? ?Generalized abdominal pain - Plan: CBC, Comprehensive metabolic panel ? ?Constipation, unspecified constipation type - Plan: Comprehensive metabolic panel, TSH ? ?Persistent cough - Plan: DG Chest 2 View ? ?Do not use My Chart to request refills or for acute issues that need immediate attention. ?  ?Abdominal pain could be caused by constipation. ?Miralax at bedtime daily until a good bowel movement then every other day. ?If in 2 days you are still having problem, add Bisacodyl 5 mg at bedtime. ?Continue adequate hydration and fiber intake. ?If not able to pass gas or pain gets worse, fever,vomiting you need to go to the ER. ? ?Please be sure medication list is accurate. ?If a new problem present, please set up appointment sooner than planned today. ?Constipation, Adult ?Constipation is when a person has fewer than three bowel movements in a week, has difficulty having a bowel movement, or has stools (feces) that are dry, hard, or larger than normal. Constipation may be caused by an underlying condition. It may become worse with age if a person takes certain medicines and does not take in enough fluids. ?Follow these instructions at home: ?Eating and drinking ? ?Eat foods that have a lot of fiber, such as beans, whole grains, and fresh fruits and vegetables. ?Limit foods that are low in fiber and high in fat and processed sugars, such as fried or sweet foods. These include french fries, hamburgers, cookies, candies, and soda. ?Drink enough fluid to keep your urine pale yellow. ?General instructions ?Exercise regularly or as told by your health care provider. Try to do 150 minutes of moderate exercise each week. ?Use the bathroom when you have the urge to go. Do not hold it in. ?Take over-the-counter and prescription medicines only as told by your health care provider. This includes any  fiber supplements. ?During bowel movements: ?Practice deep breathing while relaxing the lower abdomen. ?Practice pelvic floor relaxation. ?Watch your condition for any changes. Let your health care provider know about them. ?Keep all follow-up visits as told by your health care provider. This is important. ?Contact a health care provider if: ?You have pain that gets worse. ?You have a fever. ?You do not have a bowel movement after 4 days. ?You vomit. ?You are not hungry or you lose weight. ?You are bleeding from the opening between the buttocks (anus). ?You have thin, pencil-like stools. ?Get help right away if: ?You have a fever and your symptoms suddenly get worse. ?You leak stool or have blood in your stool. ?Your abdomen is bloated. ?You have severe pain in your abdomen. ?You feel dizzy or you faint. ?Summary ?Constipation is when a person has fewer than three bowel movements in a week, has difficulty having a bowel movement, or has stools (feces) that are dry, hard, or larger than normal. ?Eat foods that have a lot of fiber, such as beans, whole grains, and fresh fruits and vegetables. ?Drink enough fluid to keep your urine pale yellow. ?Take over-the-counter and prescription medicines only as told by your health care provider. This includes any fiber supplements. ?This information is not intended to replace advice given to you by your health care provider. Make sure you discuss any questions you have with your health care provider. ?Document Revised: 07/05/2019 Document Reviewed: 07/05/2019 ?Elsevier Patient Education ? 2022 [place]. ? ? ? ? ? ? ? ?

## 2021-12-12 ENCOUNTER — Ambulatory Visit: Payer: Medicare Other | Admitting: Family Medicine

## 2021-12-15 ENCOUNTER — Ambulatory Visit (INDEPENDENT_AMBULATORY_CARE_PROVIDER_SITE_OTHER): Payer: Medicare Other | Admitting: Family Medicine

## 2021-12-15 ENCOUNTER — Encounter: Payer: Self-pay | Admitting: Family Medicine

## 2021-12-15 VITALS — BP 106/60 | HR 72 | Temp 97.6°F | Ht 72.0 in | Wt 202.0 lb

## 2021-12-15 DIAGNOSIS — J3089 Other allergic rhinitis: Secondary | ICD-10-CM

## 2021-12-15 DIAGNOSIS — J3489 Other specified disorders of nose and nasal sinuses: Secondary | ICD-10-CM | POA: Diagnosis not present

## 2021-12-15 DIAGNOSIS — E1165 Type 2 diabetes mellitus with hyperglycemia: Secondary | ICD-10-CM | POA: Diagnosis not present

## 2021-12-15 DIAGNOSIS — I10 Essential (primary) hypertension: Secondary | ICD-10-CM | POA: Diagnosis not present

## 2021-12-15 LAB — POCT GLYCOSYLATED HEMOGLOBIN (HGB A1C): Hemoglobin A1C: 8.1 % — AB (ref 4.0–5.6)

## 2021-12-15 MED ORDER — SEMAGLUTIDE (2 MG/DOSE) 8 MG/3ML ~~LOC~~ SOPN: 2.0000 mg | PEN_INJECTOR | SUBCUTANEOUS | 11 refills | Status: DC

## 2021-12-15 NOTE — Patient Instructions (Signed)
INCREASE THE OZEMPIC TO 2 MG [place] ONCE WEEKLY ? ?REDUCE THE AMARYL 4 MG TO ONE HALF TABLET DAILY.  ? ?SET UP 3 MONTH FOLLOW UP.  ?

## 2021-12-15 NOTE — Progress Notes (Signed)
? ?Established Patient Office Visit ? ?Subjective:  ?Patient ID: Lucas Warren, male    DOB: 07/10/1946  Age: 76 y.o. MRN: [id number] ? ?CC:  ?Chief Complaint  ?Patient presents with  ? Follow-up  ? ? ?HPI ?Lucas Warren presents for medical follow-up.  He has chronic medical problems including hypertension, perennial allergic rhinitis, GERD, type 2 diabetes, chronic kidney disease stage III, hyperlipidemia.  Just recently started back walking about a mile per day.  His diabetes been difficult to control.  On multiple medications including glimepiride, Jardiance, metformin, Actos, and Ozempic.  Currently on 1 mg subcutaneous Ozempic per week.  Tolerating well.  Has lost some weight.  Down 9 pounds from last visit.  Last A1c had decreased from 8.7-7.8.  Fasting blood sugar this morning 78.  He continues to have low early morning sugars and occasional hypoglycemic symptoms.  Lately sugars tend to be higher.  He is trying to restrict carbohydrates but states that his diet "still has some room to improve ". ? ?Chronic rhinorrhea.  He has allergic rhinitis.  Currently on Claritin.  Tried nasal steroids but had nosebleed with Flonase and felt like Nasacort did not work.  He tried Atrovent nasal spray without much improvement.  His symptoms tend to be worse with meals. ? ?Past Medical History:  ?Diagnosis Date  ? Allergy   ? seasonal  ? Anxiety   ? Chest pain, atypical   ? Colon polyps   ? hyperplastic 2003  ? DJD (degenerative joint disease)   ? DM (diabetes mellitus) ([place])   ? GERD (gastroesophageal reflux disease)   ? History of headache   ? Hypercholesteremia   ? Psoriasis   ? ? ?Past Surgical History:  ?Procedure Laterality Date  ? CATARACT EXTRACTION, BILATERAL Bilateral   ? Dr. Tommy Rainwater;   ? [place]  ? LEFT HEART CATH AND CORONARY ANGIOGRAPHY N/A 05/20/2020  ? Procedure: LEFT HEART CATH AND CORONARY ANGIOGRAPHY;  Surgeon: Jettie Booze, MD;  Location: [facility];  Service:  Cardiovascular;  Laterality: N/A;  ? NASAL SEPTUM SURGERY  1989  ? ? ?Family History  ?Problem Relation Age of Onset  ? Lung cancer Mother   ? Alzheimer's disease Father   ? Heart disease Father   ? Leukemia Sister   ? ? ?Social History  ? ?Socioeconomic History  ? Marital status: Divorced  ?  Spouse name: carolyn  ? Number of children: Not on file  ? Years of education: Not on file  ? Highest education level: Not on file  ?Occupational History  ?  Comment: Patent examiner  ?Tobacco Use  ? Smoking status: Former  ?  Packs/day: 2.00  ?  Years: 20.00  ?  Pack years: 40.00  ?  Types: Cigarettes  ?  Quit date: 08/31/1981  ?  Years since quitting: 40.3  ? Smokeless tobacco: Never  ?Vaping Use  ? Vaping Use: Never used  ?Substance and Sexual Activity  ? Alcohol use: Yes  ?  Comment: rarely a beer  ? Drug use: No  ? Sexual activity: Not on file  ?Other Topics Concern  ? Not on file  ?Social History Narrative  ? Not on file  ? ?Social Determinants of Health  ? ?Financial Resource Strain: Low Risk   ? Difficulty of Paying Living Expenses: Not hard at all  ?Food Insecurity: No Food Insecurity  ? Worried About Charity fundraiser in the Last Year: Never true  ?  Ran Out of Food in the Last Year: Never true  ?Transportation Needs: No Transportation Needs  ? Lack of Transportation (Medical): No  ? Lack of Transportation (Non-Medical): No  ?Physical Activity: Insufficiently Active  ? Days of Exercise per Week: 3 days  ? Minutes of Exercise per Session: 20 min  ?Stress: No Stress Concern Present  ? Feeling of Stress : Not at all  ?Social Connections: Moderately Integrated  ? Frequency of Communication with Friends and Family: Three times a week  ? Frequency of Social Gatherings with Friends and Family: Three times a week  ? Attends Religious Services: 1 to 4 times per year  ? Active Member of Clubs or Organizations: Yes  ? Attends Archivist Meetings: More than 4 times per year  ? Marital Status: Divorced  ?Intimate  Partner Violence: Not At Risk  ? Fear of Current or Ex-Partner: No  ? Emotionally Abused: No  ? Physically Abused: No  ? Sexually Abused: No  ? ? ?Outpatient Medications Prior to Visit  ?Medication Sig Dispense Refill  ? aspirin 81 MG tablet Take 1 tablet by mouth at bedtime.     ? glimepiride (AMARYL) 4 MG tablet TAKE ONE TABLET BY MOUTH EVERY MORNING (Patient taking differently: 2 mg. TAKE ONE HALF TABLET BY MOUTH EVERY MORNING) 90 tablet 3  ? ipratropium (ATROVENT) 0.06 % nasal spray Place 2 sprays into both nostrils 4 (four) times daily. 15 mL 0  ? JARDIANCE 10 MG TABS tablet TAKE ONE TABLET BY MOUTH EVERY MORNING 90 tablet 3  ? Lancets (ONETOUCH DELICA PLUS [id number]) MISC USE 1  TO CHECK GLUCOSE ONCE DAILY 100 each 4  ? Lancets (ONETOUCH DELICA PLUS [id number]) MISC USE ONE TO CHECK GLUCOSE TWICE DAILY 180 each 3  ? lisinopril (ZESTRIL) 10 MG tablet TAKE ONE TABLET BY MOUTH EVERY EVENING 90 tablet 3  ? metFORMIN (GLUCOPHAGE) 500 MG tablet TAKE TWO TABLETS BY MOUTH TWICE DAILY with a meal 180 tablet 3  ? nitroGLYCERIN (NITROSTAT) 0.4 MG SL tablet Take one tablet sublingual as needed for chest pain up to three doses as needed. (Patient taking differently: Place 0.4 mg under the tongue every 5 (five) minutes as needed for chest pain.) 20 tablet 0  ? ONETOUCH ULTRA test strip USE ONE STRIP TWICE DAILY TO CHECK BLOOD SUGARS 180 strip 3  ? pioglitazone (ACTOS) 45 MG tablet TAKE ONE TABLET BY MOUTH EVERY MORNING 90 tablet 3  ? rosuvastatin (CRESTOR) 20 MG tablet TAKE ONE TABLET BY MOUTH EVERYDAY AT BEDTIME 90 tablet 3  ? tadalafil (CIALIS) 20 MG tablet TAKE ONE TABLET BY MOUTH every other DAY AS NEEDED 30 tablet 3  ? vitamin B-12 (CYANOCOBALAMIN) 1000 MCG tablet Take 1,000 mcg by mouth at bedtime.     ? Semaglutide, 1 MG/DOSE, (OZEMPIC, 1 MG/DOSE,) 2 MG/1.5ML SOPN Inject 1 mg into the skin. Take 1 mg  once weekly    ? ?No facility-administered medications prior to visit.  ? ? ?Allergies  ?Allergen Reactions  ?  Lisinopril Cough  ? Erythromycin   ?  REACTION: abd pain  ? Trulicity [Dulaglutide] Nausea Only  ? ? ?ROS ?Review of Systems  ?Constitutional:  Negative for fatigue.  ?HENT:  Positive for rhinorrhea. Negative for nosebleeds.   ?Eyes:  Negative for visual disturbance.  ?Respiratory:  Negative for cough, chest tightness and shortness of breath.   ?Cardiovascular:  Negative for chest pain, palpitations and leg swelling.  ?Endocrine: Negative for polydipsia and polyuria.  ?Neurological:  Negative for dizziness, syncope, weakness, light-headedness and headaches.  ? ?  ?Objective:  ?  ?Physical Exam ?Vitals reviewed.  ?Cardiovascular:  ?   Rate and Rhythm: Normal rate and regular rhythm.  ?Pulmonary:  ?   Effort: Pulmonary effort is normal.  ?   Breath sounds: Normal breath sounds.  ?Skin: ?   Comments: Feet reveal no skin lesions. Good distal foot pulses. Good capillary refill. No calluses. Normal sensation with monofilament testing ?  ?Neurological:  ?   Mental Status: He is alert.  ? ? ?BP 106/60 (BP Location: Left Arm, Patient Position: Sitting, Cuff Size: Normal)   Pulse 72   Temp 97.6 ?F (36.4 ?C) (Oral)   Ht 6' (1.829 m)   Wt 202 lb (91.6 kg)   SpO2 97%   BMI 27.40 kg/m?  ?Wt Readings from Last 3 Encounters:  ?12/15/21 202 lb (91.6 kg)  ?11/26/21 201 lb 4 oz (91.3 kg)  ?09/15/21 211 lb 4.8 oz (95.8 kg)  ? ? ? ?Health Maintenance Due  ?Topic Date Due  ? Zoster Vaccines- Shingrix (1 of 2) Never done  ? Pneumonia Vaccine 103+ Years old (28) 12/05/2017  ? COVID-19 Vaccine (4 - Booster for Pfizer series) 09/03/2020  ? ? ?There are no preventive care reminders to display for this patient. ? ?Lab Results  ?Component Value Date  ? TSH 0.71 11/26/2021  ? ?Lab Results  ?Component Value Date  ? WBC 7.4 11/26/2021  ? HGB 11.8 (L) 11/26/2021  ? HCT 35.7 (L) 11/26/2021  ? MCV 86.4 11/26/2021  ? PLT 218.0 11/26/2021  ? ?Lab Results  ?Component Value Date  ? NA 139 11/26/2021  ? K 4.0 11/26/2021  ? CO2 22 11/26/2021  ? GLUCOSE  241 (H) 11/26/2021  ? BUN 22 11/26/2021  ? CREATININE 1.61 (H) 11/26/2021  ? BILITOT 0.4 11/26/2021  ? ALKPHOS 44 11/26/2021  ? AST 11 11/26/2021  ? ALT 9 11/26/2021  ? PROT 7.2 11/26/2021  ? ALBUMIN 4.5 11/26/2021  ?

## 2021-12-16 ENCOUNTER — Telehealth: Payer: Self-pay | Admitting: Pharmacist

## 2021-12-16 NOTE — Chronic Care Management (AMB) (Signed)
? ? ?Chronic Care Management ?Pharmacy Assistant  ? ?Name: Lucas Warren  MRN: [id number] DOB: 08/29/1946 ? ?Reason for Encounter: Medication Review Medication Coordination  ?  ?Recent office visits:  ?12/15/21 [name], MD - Patient presented for Poorly controlled type 2 diabetes and other concerns.Increased Semaglutide ? ?11/26/21 Martinique, Betty G, MD - Patient presented for Generalized abdominal pain and other concerns. Stopped Montelukast  ? ?Recent consult visits:  ?None ? ?Hospital visits:  ?None in previous 6 months ? ?Medications: ?Outpatient Encounter Medications as of 12/16/2021  ?Medication Sig  ? aspirin 81 MG tablet Take 1 tablet by mouth at bedtime.   ? glimepiride (AMARYL) 4 MG tablet TAKE ONE TABLET BY MOUTH EVERY MORNING (Patient taking differently: 2 mg. TAKE ONE HALF TABLET BY MOUTH EVERY MORNING)  ? ipratropium (ATROVENT) 0.06 % nasal spray Place 2 sprays into both nostrils 4 (four) times daily.  ? JARDIANCE 10 MG TABS tablet TAKE ONE TABLET BY MOUTH EVERY MORNING  ? Lancets (ONETOUCH DELICA PLUS [id number]) MISC USE 1  TO CHECK GLUCOSE ONCE DAILY  ? Lancets (ONETOUCH DELICA PLUS [id number]) MISC USE ONE TO CHECK GLUCOSE TWICE DAILY  ? lisinopril (ZESTRIL) 10 MG tablet TAKE ONE TABLET BY MOUTH EVERY EVENING  ? metFORMIN (GLUCOPHAGE) 500 MG tablet TAKE TWO TABLETS BY MOUTH TWICE DAILY with a meal  ? nitroGLYCERIN (NITROSTAT) 0.4 MG SL tablet Take one tablet sublingual as needed for chest pain up to three doses as needed. (Patient taking differently: Place 0.4 mg under the tongue every 5 (five) minutes as needed for chest pain.)  ? ONETOUCH ULTRA test strip USE ONE STRIP TWICE DAILY TO CHECK BLOOD SUGARS  ? pioglitazone (ACTOS) 45 MG tablet TAKE ONE TABLET BY MOUTH EVERY MORNING  ? rosuvastatin (CRESTOR) 20 MG tablet TAKE ONE TABLET BY MOUTH EVERYDAY AT BEDTIME  ? Semaglutide, 2 MG/DOSE, 8 MG/3ML SOPN Inject 2 mg as directed once a week.  ? tadalafil (CIALIS) 20 MG tablet TAKE ONE TABLET BY  MOUTH every other DAY AS NEEDED  ? vitamin B-12 (CYANOCOBALAMIN) 1000 MCG tablet Take 1,000 mcg by mouth at bedtime.   ? ?No facility-administered encounter medications on file as of 12/16/2021.  ?Reviewed chart for medication changes ahead of medication coordination call. ? ?No OVs, Consults, or hospital visits since last care coordination call/Pharmacist visit.  ? ?No medication changes indicated  ?BP Readings from Last 3 Encounters:  ?12/15/21 106/60  ?11/26/21 120/60  ?09/15/21 126/60  ?  ?Lab Results  ?Component Value Date  ? HGBA1C 8.1 (A) 12/15/2021  ?  ? ?Patient obtains medications through Adherence Packaging  30 Days  ? ?Last adherence delivery included:  ?Rosuvastatin 20 mg: one tablet at bedtime ?Metformin (Glucophage) 500 mg: two tablets at breakfast and two at dinner ?Glimepiride (Amaryl) 4 mg: one tablet at breakfast ?Pioglitazone (Actos) 45 mg: one tablet at breakfast ?Empagliflozin (Jardiance) 10 mg: one tablet at breakfast ?Lisinopril (Zestril) 10 mg: one tablet at dinner ?  ?  ?Patient declined the following medications (meds) due to (reason) ?One Touch Ultra Test Strips ?One Touch Delica Lancets ?Montelukast - pt does not think this helps and PCP is ok with this ? ?Patient is due for next adherence delivery on: 12/26/21. ?Called patient and reviewed medications and coordinated delivery. ? ?This delivery to include: ?Empagliflozin (Jardiance) 10 mg: one tablet at breakfast ?Glimepiride (Amaryl) 4 mg: one tablet at breakfast ?Rosuvastatin 20 mg: one tablet at bedtime ?Metformin (Glucophage) 500 mg: two tablets at breakfast and  two at dinner ?Lisinopril (Zestril) 10 mg: one tablet at dinner ?Pioglitazone (Actos) 45 mg: one tablet at breakfast ? ?Patient declined the following medications (meds) due to (reason) ?One Touch Ultra Test Strips ?One Touch Delica Lancets ?Cialis ? ? ?Confirmed delivery date of 12/26/21, advised patient that pharmacy will contact them the morning of delivery.  ? ?Care  Gaps: ?Zoster Vaccine - Overdue ?PNA Vaccine - Overdue ?COVID Booster - Overdue ?BP- 106/60 ( 12/15/21) ?CCM- 8/23 ?AWV- 8/22 ?Lab Results  ?Component Value Date  ? HGBA1C 8.1 (A) 12/15/2021  ? ? ?Star Rating Drugs: ?Jardiance 10 mg - Last filed 11/21/21 30 [facility] at [facility] ?Rosuvastatin 20 mg - Last filled 11/21/21 30 [facility] at [facility] ?Lisinopril 10 mg- Last filled 11/21/21 30 [facility] at [facility] ?Pioglitazone 45 mg - Last filled 11/21/21 30 [facility] at [facility] ?Metformin 500 mg - Last filled 11/21/21 30 DS at [facility] ?Ozempic 0.5 mg - obtained through [facility]AP ? ? ?Ned Clines CMA ?Clinical Pharmacist Assistant ?[phone] ? ?

## 2021-12-18 NOTE — Telephone Encounter (Signed)
Faxed [facility] refill request for Ozempic dose change to PCP. It will need to be signed and faxed to [facility] [phone]. ?

## 2021-12-19 NOTE — Telephone Encounter (Signed)
Noted  

## 2022-01-15 ENCOUNTER — Telehealth: Payer: Self-pay | Admitting: Pharmacist

## 2022-01-15 NOTE — Chronic Care Management (AMB) (Signed)
Chronic Care Management Pharmacy Assistant   Name: Lucas Warren  MRN: [id number] DOB: May 10, 1946  Reason for Encounter: Medication Review Medication Coordination   Recent office visits:  None  Recent consult visits:  None                     Hospital visits:  None in previous 6 months  Medications: Outpatient Encounter Medications as of 01/15/2022  Medication Sig   aspirin 81 MG tablet Take 1 tablet by mouth at bedtime.    glimepiride (AMARYL) 4 MG tablet TAKE ONE TABLET BY MOUTH EVERY MORNING (Patient taking differently: 2 mg. TAKE ONE HALF TABLET BY MOUTH EVERY MORNING)   ipratropium (ATROVENT) 0.06 % nasal spray Place 2 sprays into both nostrils 4 (four) times daily.   JARDIANCE 10 MG TABS tablet TAKE ONE TABLET BY MOUTH EVERY MORNING   Lancets (ONETOUCH DELICA PLUS [id number]) MISC USE 1  TO CHECK GLUCOSE ONCE DAILY   Lancets (ONETOUCH DELICA PLUS [id number]) MISC USE ONE TO CHECK GLUCOSE TWICE DAILY   lisinopril (ZESTRIL) 10 MG tablet TAKE ONE TABLET BY MOUTH EVERY EVENING   metFORMIN (GLUCOPHAGE) 500 MG tablet TAKE TWO TABLETS BY MOUTH TWICE DAILY with a meal   nitroGLYCERIN (NITROSTAT) 0.4 MG SL tablet Take one tablet sublingual as needed for chest pain up to three doses as needed. (Patient taking differently: Place 0.4 mg under the tongue every 5 (five) minutes as needed for chest pain.)   ONETOUCH ULTRA test strip USE ONE STRIP TWICE DAILY TO CHECK BLOOD SUGARS   pioglitazone (ACTOS) 45 MG tablet TAKE ONE TABLET BY MOUTH EVERY MORNING   rosuvastatin (CRESTOR) 20 MG tablet TAKE ONE TABLET BY MOUTH EVERYDAY AT BEDTIME   Semaglutide, 2 MG/DOSE, 8 MG/3ML SOPN Inject 2 mg as directed once a week.   tadalafil (CIALIS) 20 MG tablet TAKE ONE TABLET BY MOUTH every other DAY AS NEEDED   vitamin B-12 (CYANOCOBALAMIN) 1000 MCG tablet Take 1,000 mcg by mouth at bedtime.    No facility-administered encounter medications on file as of 01/15/2022.  Reviewed chart for medication changes  ahead of medication coordination call.  No OVs, Consults, or hospital visits since last care coordination call/Pharmacist visit.   No medication changes indicated.  BP Readings from Last 3 Encounters:  12/15/21 106/60  11/26/21 120/60  09/15/21 126/60    Lab Results  Component Value Date   HGBA1C 8.1 (A) 12/15/2021     Patient obtains medications through Adherence Packaging  30 Days   Last adherence delivery included:  Empagliflozin (Jardiance) 10 mg: one tablet at breakfast Glimepiride (Amaryl) 4 mg: one tablet at breakfast Rosuvastatin 20 mg: one tablet at bedtime Metformin (Glucophage) 500 mg: two tablets at breakfast and two at dinner Lisinopril (Zestril) 10 mg: one tablet at dinner Pioglitazone (Actos) 45 mg: one tablet at breakfast   Patient declined the following medications (meds) due to (reason) One Touch Ultra Test Strips One Touch Delica Lancets Cialis   Patient is due for next adherence delivery on: 01/27/22. Called patient and reviewed medications and coordinated delivery. Packs 30 DS  This delivery to include: Empagliflozin (Jardiance) 10 mg: one tablet at breakfast Glimepiride (Amaryl) 4 mg: one tablet at breakfast Rosuvastatin 20 mg: one tablet at bedtime Metformin (Glucophage) 500 mg: two tablets at breakfast and two at dinner Lisinopril (Zestril) 10 mg: one tablet at dinner Pioglitazone (Actos) 45 mg: one tablet at breakfast  Added One Touch Ultra Test Strips One Touch Lucas Warren  Lancets:  Patient declined  Cialis     Confirmed delivery date of 01/27/22, advised patient that pharmacy will contact them the morning of delivery.   Care Gaps: Zoster Vaccine - Overdue PNA Vaccine  - Overdue COVID Booster - Overdue CCM- 8/23 AWV- 8/22 Lab Results  Component Value Date   HGBA1C 8.1 (A) 12/15/2021    Star Rating Drugs: Jardiance 10 mg - Last filed 12/22/21 30 DS at [facility] Rosuvastatin 20 mg - Last filled 12/22/21 30 [facility] Lisinopril 10  mg- Last filled 12/22/21 30 [facility] Pioglitazone 45 mg - Last filled 12/22/21 30 [facility] Metformin 500 mg - Last filled 12/22/21 30 [facility] at [facility] Ozempic 0.5 mg - obtained through [facility] Pharmacist Assistant [phone]

## 2022-02-12 ENCOUNTER — Telehealth: Payer: Self-pay | Admitting: Pharmacist

## 2022-02-12 ENCOUNTER — Other Ambulatory Visit: Payer: Self-pay | Admitting: Family Medicine

## 2022-02-12 NOTE — Chronic Care Management (AMB) (Signed)
Chronic Care Management Pharmacy Assistant   Name: Lucas Warren  MRN: [id number] DOB: [birth year]  Reason for Encounter: Medication Review   Conditions to be addressed/monitored: DMII  Recent office visits:  None  Recent consult visits:  None  Hospital visits:  None in previous 6 months  Medications: Outpatient Encounter Medications as of 02/12/2022  Medication Sig   aspirin 81 MG tablet Take 1 tablet by mouth at bedtime.    glimepiride (AMARYL) 4 MG tablet TAKE ONE TABLET BY MOUTH EVERY MORNING (Patient taking differently: 2 mg. TAKE ONE HALF TABLET BY MOUTH EVERY MORNING)   ipratropium (ATROVENT) 0.06 % nasal spray Place 2 sprays into both nostrils 4 (four) times daily.   JARDIANCE 10 MG TABS tablet TAKE ONE TABLET BY MOUTH EVERY MORNING   Lancets (ONETOUCH DELICA PLUS [id number]) MISC USE 1  TO CHECK GLUCOSE ONCE DAILY   Lancets (ONETOUCH DELICA PLUS [id number]) MISC USE ONE TO CHECK GLUCOSE TWICE DAILY   lisinopril (ZESTRIL) 10 MG tablet TAKE ONE TABLET BY MOUTH EVERY EVENING   metFORMIN (GLUCOPHAGE) 500 MG tablet TAKE TWO TABLETS BY MOUTH TWICE DAILY with a meal   nitroGLYCERIN (NITROSTAT) 0.4 MG SL tablet Take one tablet sublingual as needed for chest pain up to three doses as needed. (Patient taking differently: Place 0.4 mg under the tongue every 5 (five) minutes as needed for chest pain.)   ONETOUCH ULTRA test strip USE ONE STRIP TWICE DAILY TO CHECK BLOOD SUGARS   pioglitazone (ACTOS) 45 MG tablet TAKE ONE TABLET BY MOUTH EVERY MORNING   rosuvastatin (CRESTOR) 20 MG tablet TAKE ONE TABLET BY MOUTH EVERYDAY AT BEDTIME   Semaglutide, 2 MG/DOSE, 8 MG/3ML SOPN Inject 2 mg as directed once a week.   tadalafil (CIALIS) 20 MG tablet TAKE ONE TABLET BY MOUTH every other DAY AS NEEDED   vitamin B-12 (CYANOCOBALAMIN) 1000 MCG tablet Take 1,000 mcg by mouth at bedtime.    No facility-administered encounter medications on file as of 02/12/2022.   Recent Relevant Labs: Lab  Results  Component Value Date/Time   HGBA1C 8.1 (A) 12/15/2021 08:31 AM   HGBA1C 7.8 (A) 09/15/2021 08:45 AM   HGBA1C 8.3 (H) 12/25/2015 08:39 AM   HGBA1C 8.4 10/14/2015 12:00 AM   HGBA1C 7.3 (H) 05/22/2015 09:53 AM   MICROALBUR 2.5 06/11/2020 08:23 AM   MICROALBUR 4.3 (H) 01/10/2018 08:54 AM    Kidney Function Lab Results  Component Value Date/Time   CREATININE 1.61 (H) 11/26/2021 11:14 AM   CREATININE 1.47 09/15/2021 08:57 AM   CREATININE 1.62 (H) 06/11/2020 08:31 AM   GFR 42.05 (L) 11/26/2021 11:14 AM   GFRNONAA 42 (L) 06/11/2020 08:31 AM   GFRAA 48 (L) 06/11/2020 08:31 AM    Current antihyperglycemic regimen:  Glimepiride '4mg'$ , 1 tablet once daily with breakfast - appropriate, query effective ( taking half) Empagliflozin (Jardiance) '10mg'$ , 1 tablet once daily - appropriate, query effective  Metformin '500mg'$ , 1 tablets twice daily - appropriate, query effective Pioglitazone (Actos) '45mg'$ , 1 tablet once daily - appropriate, query effective Ozempic, inject 2 mg once weekly - appropriate, query effective What recent interventions/DTPs have been made to improve glycemic control:  Provider instructed to take half of glimepiride tab daily and increase in ozempic Have there been any recent hospitalizations or ED visits since last visit with CPP? No Patient denies hypoglycemic symptoms, including None Patient denies hyperglycemic symptoms, including none How often are you checking your blood sugar? twice daily What are your blood sugars ranging?  Patient reports his sugars are good in the 80's fasting in the mornings but tend to be higher by the end of the day. Reports he is due for an A1C check soon with PCP During the week, how often does your blood glucose drop below 70? Never  Adherence Review: Is the patient currently on a STATIN medication? Yes Is the patient currently on ACE/ARB medication? Yes Does the patient have >5 day gap between last estimated fill dates? No   Reviewed  chart for medication changes ahead of medication coordination call.  No OVs, Consults, or hospital visits since last care coordination call/Pharmacist visit.   No medication changes indicated .  BP Readings from Last 3 Encounters:  12/15/21 106/60  11/26/21 120/60  09/15/21 126/60    Lab Results  Component Value Date   HGBA1C 8.1 (A) 12/15/2021     Patient obtains medications through Adherence Packaging  30 Days   Last adherence delivery included:  Empagliflozin (Jardiance) 10 mg: one tablet at breakfast Glimepiride (Amaryl) 4 mg: one tablet at breakfast Rosuvastatin 20 mg: one tablet at bedtime Metformin (Glucophage) 500 mg: two tablets at breakfast and two at dinner Lisinopril (Zestril) 10 mg: one tablet at dinner Pioglitazone (Actos) 45 mg: one tablet at breakfast   Added One Touch Ultra Test Strips One Touch Delica Lancets:   Patient declined  Cialis  Patient is due for next adherence delivery on: 02/25/22. Called patient and reviewed medications and coordinated delivery. Packs 30 DS  This delivery to include: Empagliflozin (Jardiance) 10 mg: one tablet at breakfast Glimepiride (Amaryl) 4 mg: one tablet at breakfast Rosuvastatin 20 mg: one tablet at bedtime Metformin (Glucophage) 500 mg: two tablets at breakfast and two at dinner Lisinopril (Zestril) 10 mg: one tablet at dinner Pioglitazone (Actos) 45 mg: one tablet at breakfast   Due in July One Touch Ultra Test Strips One Touch Delica Lancets   Confirmed delivery date of 02/26/22, advised patient that pharmacy will contact them the morning of delivery.  Changed from 6/28 as patient reports he works on [employer].  Care Gaps: Zoster Vaccine - Overdue PNA Vaccine - Overdue COVID Booster - Overdue CCM- 8/23 BP- 106/60 ( 12/15/21) AWV- 8/22 Lab Results  Component Value Date   HGBA1C 8.1 (A) 12/15/2021    Star Rating Drugs: Jardiance 10 mg - Last filed 01/20/22 30 DS at [facility] Rosuvastatin 20 mg - Last  filled 01/20/22 30 [facility] Lisinopril 10 mg- Last filled 01/20/22 30 [facility] Pioglitazone 45 mg - Last filled 01/20/22 30 [facility] Metformin 500 mg - Last filled 01/20/22 30 DS at [facility] Ozempic 2 mg - obtained through [facility]   Patient Assistance: Ozempic 2 mg - obtained through [facility]  [place] Pharmacist Assistant [phone]

## 2022-03-03 NOTE — Progress Notes (Unsigned)
Cardiology Office Note   Date:  03/03/2022   ID:  Lucas Warren, DOB [birth year], MRN [id number]  PCP:  [name], MD    No chief complaint on file.  CAD  Wt Readings from Last 3 Encounters:  12/15/21 202 lb (91.6 kg)  11/26/21 201 lb 4 oz (91.3 kg)  09/15/21 211 lb 4.8 oz (95.8 kg)       History of Present Illness: Lucas Warren is a 76 y.o. male   who I first saw in September 2021 for chest discomfort.  He underwent cardiac catheterization in September 2021 showing: "Ost RCA to Prox RCA lesion is 10% stenosed. Prox LAD lesion is 10% stenosed. Mid Cx lesion is 10% stenosed. The left ventricular systolic function is normal. LV end diastolic pressure is mildly elevated. The left ventricular ejection fraction is 55-65% by visual estimate. There is no aortic valve stenosis.   Mild, scattered, nonobstructive coronary atherosclerosis.  Continue preventive therapy."  He had some atypical chest pain post cath.   He took COVID shots.   Since last visit, he Denies : Chest pain. Dizziness. Leg edema. Nitroglycerin use. Orthopnea. Palpitations. Paroxysmal nocturnal dyspnea. Shortness of breath. Syncope.    He does mild activity. He had COVID and activity level dropped of after that.  He feels some leg weakness.     Past Medical History:  Diagnosis Date   Allergy    seasonal   Anxiety    Chest pain, atypical    Colon polyps    hyperplastic 2003   DJD (degenerative joint disease)    DM (diabetes mellitus) ([place])    GERD (gastroesophageal reflux disease)    History of headache    Hypercholesteremia    Psoriasis     Past Surgical History:  Procedure Laterality Date   CATARACT EXTRACTION, BILATERAL Bilateral    Dr. Tommy Rainwater;    [place] CATH AND CORONARY ANGIOGRAPHY N/A 05/20/2020   Procedure: LEFT HEART CATH AND CORONARY ANGIOGRAPHY;  Surgeon: Jettie Booze, MD;  Location: [facility];  Service: Cardiovascular;   Laterality: N/A;   NASAL SEPTUM SURGERY  1989     Current Outpatient Medications  Medication Sig Dispense Refill   aspirin 81 MG tablet Take 1 tablet by mouth at bedtime.      glimepiride (AMARYL) 4 MG tablet TAKE ONE TABLET BY MOUTH EVERY MORNING (Patient taking differently: 2 mg. TAKE ONE HALF TABLET BY MOUTH EVERY MORNING) 90 tablet 3   ipratropium (ATROVENT) 0.06 % nasal spray Place 2 sprays into both nostrils 4 (four) times daily. 15 mL 0   JARDIANCE 10 MG TABS tablet TAKE ONE TABLET BY MOUTH EVERY MORNING 90 tablet 3   Lancets (ONETOUCH DELICA PLUS URKYHC62B) MISC USE 1  TO CHECK GLUCOSE ONCE DAILY 100 each 4   Lancets (ONETOUCH DELICA PLUS [id number]) MISC USE ONE TO CHECK GLUCOSE TWICE DAILY 180 each 3   lisinopril (ZESTRIL) 10 MG tablet TAKE ONE TABLET BY MOUTH EVERY EVENING 90 tablet 3   metFORMIN (GLUCOPHAGE) 500 MG tablet TAKE TWO TABLETS BY MOUTH TWICE DAILY with a meal 180 tablet 3   nitroGLYCERIN (NITROSTAT) 0.4 MG SL tablet Take one tablet sublingual as needed for chest pain up to three doses as needed. (Patient taking differently: Place 0.4 mg under the tongue every 5 (five) minutes as needed for chest pain.) 20 tablet 0   ONETOUCH ULTRA test strip USE ONE STRIP TWICE DAILY  TO CHECK BLOOD SUGARS 180 strip 3   pioglitazone (ACTOS) 45 MG tablet TAKE ONE TABLET BY MOUTH EVERY MORNING 90 tablet 0   rosuvastatin (CRESTOR) 20 MG tablet TAKE ONE TABLET BY MOUTH EVERYDAY AT BEDTIME 90 tablet 3   Semaglutide, 2 MG/DOSE, 8 MG/3ML SOPN Inject 2 mg as directed once a week. 3 mL 11   tadalafil (CIALIS) 20 MG tablet TAKE ONE TABLET BY MOUTH every other DAY AS NEEDED 30 tablet 3   vitamin B-12 (CYANOCOBALAMIN) 1000 MCG tablet Take 1,000 mcg by mouth at bedtime.      No current facility-administered medications for this visit.    Allergies:   Lisinopril, Erythromycin, and Trulicity [dulaglutide]    Social History:  The patient  reports that he quit smoking about 40 years ago. His  smoking use included cigarettes. He has a 40.00 pack-year smoking history. He has never used smokeless tobacco. He reports current alcohol use. He reports that he does not use drugs.   Family History:  The patient's family history includes Alzheimer's disease in his father; Heart disease in his father; Leukemia in his sister; Lung cancer in his mother.    ROS:  Please see the history of present illness.   Otherwise, review of systems are positive for leg weakness.   All other systems are reviewed and negative.    PHYSICAL EXAM: VS:  There were no vitals taken for this visit. , BMI There is no height or weight on file to calculate BMI. GEN: Well nourished, well developed, in no acute distress HEENT: normal Neck: no JVD, carotid bruits, or masses Cardiac: RRR; no murmurs, rubs, or gallops,no edema  Respiratory:  clear to auscultation bilaterally, normal work of breathing GI: soft, nontender, nondistended, + BS MS: no deformity or atrophy Skin: warm and dry, no rash Neuro:  Strength and sensation are intact Psych: euthymic mood, full affect   EKG:   The ekg ordered today demonstrates NSR, PRWP, LAD   Recent Labs: 11/26/2021: ALT 9; BUN 22; Creatinine, Ser 1.61; Hemoglobin 11.8; Platelets 218.0; Potassium 4.0; Sodium 139; TSH 0.71   Lipid Panel    Component Value Date/Time   CHOL 109 09/15/2021 0857   TRIG 109.0 09/15/2021 0857   HDL 50.50 09/15/2021 0857   CHOLHDL 2 09/15/2021 0857   VLDL 21.8 09/15/2021 0857   LDLCALC 37 09/15/2021 0857   LDLCALC 57 06/11/2020 0823     Other studies Reviewed: Additional studies/ records that were reviewed today with results demonstrating: Labs reviewed.   ASSESSMENT AND PLAN:  Mild CAD: No angina.  Takes aspirin, not daily.  We discussed secondary prevention.  Healthy diet and increasing regular exercise to target noted below will be helpful for preventing heart disease as well as improving his A1c. DM: A1C 8.1 in 4/23.  Whole food, plant  based diet.  Hyperlipidemia: LDL 37 in January 2023, triglycerides 109, HDL 50,Total cholesterol 109. CKD: Creatinine 1.6.  Avoid nephrotoxins.    Current medicines are reviewed at length with the patient today.  The patient concerns regarding his medicines were addressed.  The following changes have been made:  No change  Labs/ tests ordered today include:  No orders of the defined types were placed in this encounter.   Recommend 150 minutes/week of aerobic exercise Low fat, low carb, high fiber diet recommended  Disposition:   FU in 1 year   Signed, [name], MD  03/03/2022 11:31 PM    [facility]  [address] [place], [place]  [address] Phone: [phone]; Fax: [phone]

## 2022-03-04 ENCOUNTER — Ambulatory Visit: Payer: Medicare Other | Admitting: Interventional Cardiology

## 2022-03-04 ENCOUNTER — Encounter: Payer: Self-pay | Admitting: Interventional Cardiology

## 2022-03-04 VITALS — BP 102/60 | HR 71 | Ht 72.0 in | Wt 201.0 lb

## 2022-03-04 DIAGNOSIS — E1159 Type 2 diabetes mellitus with other circulatory complications: Secondary | ICD-10-CM | POA: Diagnosis not present

## 2022-03-04 DIAGNOSIS — N1831 Chronic kidney disease, stage 3a: Secondary | ICD-10-CM | POA: Diagnosis not present

## 2022-03-04 DIAGNOSIS — E782 Mixed hyperlipidemia: Secondary | ICD-10-CM

## 2022-03-04 DIAGNOSIS — I25118 Atherosclerotic heart disease of native coronary artery with other forms of angina pectoris: Secondary | ICD-10-CM

## 2022-03-04 NOTE — Patient Instructions (Signed)
Medication Instructions:  Your physician recommends that you continue on your current medications as directed. Please refer to the Current Medication list given to you today.  *If you need a refill on your cardiac medications before your next appointment, please call your pharmacy*  Lab Work: If you have labs (blood work) drawn today and your tests are completely normal, you will receive your results only by: [facility] (if you have MyChart) OR A paper copy in the mail If you have any lab test that is abnormal or we need to change your treatment, we will call you to review the results.  Follow-Up: At [facility], you and your health needs are our priority.  As part of our continuing mission to provide you with exceptional heart care, we have created designated Provider Care Teams.  These Care Teams include your primary Cardiologist (physician) and Advanced Practice Providers (APPs -  Physician Assistants and Nurse Practitioners) who all work together to provide you with the care you need, when you need it.  We recommend signing up for the patient portal called "MyChart".  Sign up information is provided on this After Visit Summary.  MyChart is used to connect with patients for Virtual Visits (Telemedicine).  Patients are able to view lab/test results, encounter notes, upcoming appointments, etc.  Non-urgent messages can be sent to your provider as well.   To learn more about what you can do with MyChart, go to [email or web address].    Your next appointment:   1 year(s)  The format for your next appointment:   In Person  Provider:   [name], MD {   Important Information About Sugar

## 2022-03-13 ENCOUNTER — Other Ambulatory Visit: Payer: Self-pay | Admitting: Family Medicine

## 2022-03-16 ENCOUNTER — Ambulatory Visit (INDEPENDENT_AMBULATORY_CARE_PROVIDER_SITE_OTHER): Payer: Medicare Other | Admitting: Family Medicine

## 2022-03-16 ENCOUNTER — Telehealth: Payer: Medicare Other

## 2022-03-16 ENCOUNTER — Encounter: Payer: Self-pay | Admitting: Family Medicine

## 2022-03-16 VITALS — BP 126/66 | HR 65 | Temp 97.6°F | Ht 72.0 in | Wt 201.5 lb

## 2022-03-16 DIAGNOSIS — J3089 Other allergic rhinitis: Secondary | ICD-10-CM | POA: Diagnosis not present

## 2022-03-16 DIAGNOSIS — I10 Essential (primary) hypertension: Secondary | ICD-10-CM

## 2022-03-16 DIAGNOSIS — E1165 Type 2 diabetes mellitus with hyperglycemia: Secondary | ICD-10-CM | POA: Diagnosis not present

## 2022-03-16 LAB — POCT GLYCOSYLATED HEMOGLOBIN (HGB A1C): Hemoglobin A1C: 7.8 % — AB (ref 4.0–5.6)

## 2022-03-16 NOTE — Progress Notes (Signed)
Established Patient Office Visit  Subjective   Patient ID: Lucas Warren, male    DOB: 1946/07/08  Age: 76 y.o. MRN: [id number]  Chief Complaint  Patient presents with   Follow-up    HPI   Lucas Warren is here for medical follow-up.  His main concern is that he is having ongoing daily rhinitis symptoms.  He has tried multiple things including Singulair, Xyzal, Zyrtec, Flonase, Nasacort AQ, Astelin all without success.  Has daily rhinorrhea when he first gets up in the morning and pretty much throughout the day.  This is year-round.  No pets.  He had nosebleed issues with Flonase but did not see any improvement with Nasacort AQ either.  He is not aware of specific triggers.  He suspects dust but suspects other triggers as well.  He would like to see allergist at this time.  Has never had immunotherapy  Type 2 diabetes.  Challenging to control and on multiple medications.  We recently increased his Ozempic to 2 mg once weekly.  A1c is down slightly today.  Weight only down 1 pound from last visit.  Tolerating Ozempic well.  No nausea.  Currently not exercising much.  He takes lisinopril for hypertension.  No recent orthostatic symptoms.  No chest pains.  Past Medical History:  Diagnosis Date   Allergy    seasonal   Anxiety    Chest pain, atypical    Colon polyps    hyperplastic 2003   DJD (degenerative joint disease)    DM (diabetes mellitus) ([place])    GERD (gastroesophageal reflux disease)    History of headache    Hypercholesteremia    Psoriasis    Past Surgical History:  Procedure Laterality Date   CATARACT EXTRACTION, BILATERAL Bilateral    Dr. Tommy Rainwater;    [place] CATH AND CORONARY ANGIOGRAPHY N/A 05/20/2020   Procedure: LEFT HEART CATH AND CORONARY ANGIOGRAPHY;  Surgeon: Jettie Booze, MD;  Location: [facility];  Service: Cardiovascular;  Laterality: N/A;   [place]    reports that he quit smoking about 40  years ago. His smoking use included cigarettes. He has a 40.00 pack-year smoking history. He has never used smokeless tobacco. He reports current alcohol use. He reports that he does not use drugs. family history includes Alzheimer's disease in his father; Heart disease in his father; Leukemia in his sister; Lung cancer in his mother. Allergies  Allergen Reactions   Lisinopril Cough   Erythromycin     REACTION: abd pain   Trulicity [Dulaglutide] Nausea Only    Review of Systems  Constitutional:  Negative for malaise/fatigue.  HENT:  Positive for congestion. Negative for nosebleeds and sinus pain.   Eyes:  Negative for blurred vision.  Respiratory:  Negative for shortness of breath.   Cardiovascular:  Negative for chest pain.  Neurological:  Negative for dizziness, weakness and headaches.      Objective:     BP 126/66 (BP Location: Left Arm, Patient Position: Sitting, Cuff Size: Normal)   Pulse 65   Temp 97.6 F (36.4 C) (Oral)   Ht 6' (1.829 m)   Wt 201 lb 8 oz (91.4 kg)   SpO2 98%   BMI 27.33 kg/m    Physical Exam Constitutional:      Appearance: He is well-developed.  Eyes:     Pupils: Pupils are equal, round, and reactive to light.  Neck:  Thyroid: No thyromegaly.  Cardiovascular:     Rate and Rhythm: Normal rate and regular rhythm.  Pulmonary:     Effort: Pulmonary effort is normal. No respiratory distress.     Breath sounds: Normal breath sounds. No wheezing or rales.  Musculoskeletal:     Cervical back: Neck supple.  Neurological:     Mental Status: He is alert and oriented to person, place, and time.      Results for orders placed or performed in visit on 03/16/22  POCT glycosylated hemoglobin (Hb A1C)  Result Value Ref Range   Hemoglobin A1C 7.8 (A) 4.0 - 5.6 %   HbA1c POC (<> result, manual entry)     HbA1c, POC (prediabetic range)     HbA1c, POC (controlled diabetic range)        The ASCVD Risk score (Arnett DK, et al., 2019) failed to  calculate for the following reasons:   The valid total cholesterol range is 130 to 320 mg/dL    Assessment & Plan:   Problem List Items Addressed This Visit       Unprioritized   Perennial allergic rhinitis   Relevant Orders   Ambulatory referral to Allergy   Hypertension   Poorly controlled type 2 diabetes mellitus ([place]) - Primary   Relevant Orders   POCT glycosylated hemoglobin (Hb A1C) (Completed)  -Diabetes is slightly improved with A1c down to 7.8%.  We elected to continue his current medications.  We have challenged him to try to step up his exercise with walking.  Set up 70-monthfollow-up.  Continue with yearly diabetic eye exam.  Check urine microalbumin at follow-up  -Set up referral to allergist.  He has been on multiple medications as above without success in managing his daily rhinitis symptoms.  Has not been allergy tested in the past.  -Hypertension well-controlled on lisinopril 10 mg daily.  Continue current regimen.  No follow-ups on file.    BCarolann Littler MD

## 2022-03-16 NOTE — Patient Instructions (Addendum)
Let me know if you have not heard back regarding allergy referral in 2 weeks.    A1C improved to 7.8%.    let's plan on 3 month follow up.

## 2022-03-17 ENCOUNTER — Telehealth: Payer: Self-pay | Admitting: Pharmacist

## 2022-03-17 NOTE — Chronic Care Management (AMB) (Signed)
Chronic Care Management Pharmacy Assistant   Name: Lucas Warren  MRN: [id number] DOB: [birth year]  Reason for Encounter: Medication Review/ Medication Coordination   Recent office visits:  03/16/22 [name], MD - Patient presented for Poorly controlled type 2 diabetes mellitus and other concerns. Stopped Ipratropium Bromide. Referred to Allergist.  Recent consult visits:  03/04/22 Jettie Booze, MD (Cardiology) - Patient presented for CAD involving native coronary artery of native heart with other form of angina pectoris and other concerns. No medication changes. Recommended 150 min of exercise a week & low fat/carb and high fiber diet.   Hospital visits:  None in previous 6 months  Medications: Outpatient Encounter Medications as of 03/17/2022  Medication Sig   aspirin 81 MG tablet Take 1 tablet by mouth at bedtime.    glimepiride (AMARYL) 4 MG tablet TAKE ONE TABLET BY MOUTH EVERY MORNING (Patient taking differently: 2 mg. TAKE ONE HALF TABLET BY MOUTH EVERY MORNING)   JARDIANCE 10 MG TABS tablet TAKE ONE TABLET BY MOUTH EVERY MORNING   Lancets (ONETOUCH DELICA PLUS [id number]) MISC USE ONE TO CHECK GLUCOSE TWICE DAILY   lisinopril (ZESTRIL) 10 MG tablet TAKE ONE TABLET BY MOUTH EVERY EVENING   metFORMIN (GLUCOPHAGE) 500 MG tablet TAKE TWO TABLETS BY MOUTH TWICE DAILY with a meal   nitroGLYCERIN (NITROSTAT) 0.4 MG SL tablet Take one tablet sublingual as needed for chest pain up to three doses as needed.   ONETOUCH ULTRA test strip USE ONE STRIP TWICE DAILY TO CHECK BLOOD SUGARS   pioglitazone (ACTOS) 45 MG tablet TAKE ONE TABLET BY MOUTH EVERY MORNING   rosuvastatin (CRESTOR) 20 MG tablet TAKE ONE TABLET BY MOUTH EVERYDAY AT BEDTIME   Semaglutide, 2 MG/DOSE, 8 MG/3ML SOPN Inject 2 mg as directed once a week.   tadalafil (CIALIS) 20 MG tablet TAKE ONE TABLET BY MOUTH every other DAY AS NEEDED   vitamin B-12 (CYANOCOBALAMIN) 1000 MCG tablet Take 1,000 mcg by mouth at  bedtime.    No facility-administered encounter medications on file as of 03/17/2022.    BP Readings from Last 3 Encounters:  03/16/22 126/66  03/04/22 102/60  12/15/21 106/60    Lab Results  Component Value Date   HGBA1C 7.8 (A) 03/16/2022     Patient obtains medications through Adherence Packaging  30 Days   Last adherence delivery included:  Empagliflozin (Jardiance) 10 mg: one tablet at breakfast Glimepiride (Amaryl) 4 mg: one tablet at breakfast Rosuvastatin 20 mg: one tablet at bedtime Metformin (Glucophage) 500 mg: two tablets at breakfast and two at dinner Lisinopril (Zestril) 10 mg: one tablet at dinner Pioglitazone (Actos) 45 mg: one tablet at breakfast     Due in July One Touch Ultra Test Strips One Touch Delica Lancets     Confirmed delivery date of 02/26/22, advised patient that pharmacy will contact them the morning of delivery.  Changed from 6/28 as patient reports he works on [employer].    Patient is due for next adherence delivery on: 03/26/22. Called patient and reviewed medications and coordinated delivery. Packs 30 DS  This delivery to include: Empagliflozin (Jardiance) 10 mg: one tablet at breakfast Glimepiride (Amaryl) 4 mg: one tablet at breakfast Rosuvastatin 20 mg: one tablet at bedtime Metformin (Glucophage) 500 mg: two tablets at breakfast and two at dinner Lisinopril (Zestril) 10 mg: one tablet at dinner Pioglitazone (Actos) 45 mg: one tablet at breakfast      Patient declined the following medications (meds) due to (reason)  One Touch Ultra Test Strips One Touch Delica Lancets    Confirmed delivery date of 03/26/22, advised patient that pharmacy will contact them the morning of delivery.  Care Gaps: Zoster Vaccine - Overdue PNA Vaccine - Overdue COVID Booster - Overdue BP- 122/66 03/16/22 AWV-8/22 CCM- 8/23 Lab Results  Component Value Date   HGBA1C 7.8 (A) 03/16/2022    [facility]: Jardiance 10 mg - Last filed 02/19/22  30 DS at [facility] Rosuvastatin 20 mg - Last filled 02/19/22 30 DS at [facility] Lisinopril 10 mg- Last filled 02/19/22 30 DS at [facility] Pioglitazone 45 mg - Last filled 02/19/22 30 DS at [facility] Metformin 500 mg - Last filled 02/19/22 30 DS at [facility] Ozempic 2 mg - obtained through [facility] Pharmacist Assistant [phone]

## 2022-03-19 ENCOUNTER — Other Ambulatory Visit: Payer: Self-pay

## 2022-03-19 ENCOUNTER — Other Ambulatory Visit: Payer: Self-pay | Admitting: Family Medicine

## 2022-03-19 MED ORDER — METFORMIN HCL 1000 MG PO TABS: 1000.0000 mg | ORAL_TABLET | Freq: Two times a day (BID) | ORAL | 1 refills | Status: DC

## 2022-04-15 ENCOUNTER — Telehealth: Payer: Self-pay | Admitting: Pharmacist

## 2022-04-15 NOTE — Chronic Care Management (AMB) (Signed)
Chronic Care Management Pharmacy Assistant   Name: Lucas Warren  MRN: [id number] DOB: [birth year]  Reason for Encounter: Medication Review Medication Coordination   Recent office visits:  03/16/22 [name], MD - Patient presented for Poorly controlled type 2 diabetes and other concerns. Stopped Ipratropium Bromide.  Recent consult visits:  03/04/22 Jettie Booze, MD (Cardiology) - Patient presented for CAD involving native coronary artery of native heart with other form of angina pectoris and other concerns. No medication changes.  Hospital visits:  None in previous 6 months  Medications: Outpatient Encounter Medications as of 04/15/2022  Medication Sig   aspirin 81 MG tablet Take 1 tablet by mouth at bedtime.    glimepiride (AMARYL) 4 MG tablet TAKE ONE TABLET BY MOUTH EVERY MORNING (Patient taking differently: 2 mg. TAKE ONE HALF TABLET BY MOUTH EVERY MORNING)   JARDIANCE 10 MG TABS tablet TAKE ONE TABLET BY MOUTH EVERY MORNING   Lancets (ONETOUCH DELICA PLUS [id number]) MISC USE ONE TO CHECK GLUCOSE TWICE DAILY   lisinopril (ZESTRIL) 10 MG tablet TAKE ONE TABLET BY MOUTH EVERY EVENING   metFORMIN (GLUCOPHAGE) 1000 MG tablet Take 1 tablet (1,000 mg total) by mouth 2 (two) times daily with a meal.   nitroGLYCERIN (NITROSTAT) 0.4 MG SL tablet Take one tablet sublingual as needed for chest pain up to three doses as needed.   ONETOUCH ULTRA test strip USE ONE STRIP TWICE DAILY TO CHECK BLOOD SUGARS   pioglitazone (ACTOS) 45 MG tablet TAKE ONE TABLET BY MOUTH EVERY MORNING   rosuvastatin (CRESTOR) 20 MG tablet TAKE ONE TABLET BY MOUTH EVERYDAY AT BEDTIME   Semaglutide, 2 MG/DOSE, 8 MG/3ML SOPN Inject 2 mg as directed once a week.   tadalafil (CIALIS) 20 MG tablet TAKE ONE TABLET BY MOUTH every other DAY AS NEEDED   vitamin B-12 (CYANOCOBALAMIN) 1000 MCG tablet Take 1,000 mcg by mouth at bedtime.    No facility-administered encounter medications on file as of 04/15/2022.    Reviewed chart for medication changes ahead of medication coordination call.  BP Readings from Last 3 Encounters:  03/16/22 126/66  03/04/22 102/60  12/15/21 106/60    Lab Results  Component Value Date   HGBA1C 7.8 (A) 03/16/2022     Patient obtains medications through Adherence Packaging  30 Days   Last adherence delivery included:  Empagliflozin (Jardiance) 10 mg: one tablet at breakfast Glimepiride (Amaryl) 4 mg: one tablet at breakfast Rosuvastatin 20 mg: one tablet at bedtime Metformin (Glucophage) 500 mg: two tablets at breakfast and two at dinner Lisinopril (Zestril) 10 mg: one tablet at dinner Pioglitazone (Actos) 45 mg: one tablet at breakfast         Patient declined the following medications (meds) due to (reason)  One Touch Ultra Test Strips One Touch Delica Lancets    Patient is due for next adherence delivery on: 04/27/22. Called patient and reviewed medications and coordinated delivery. Packs 30 DS  This delivery to include: Empagliflozin (Jardiance) 10 mg: one tablet at breakfast Glimepiride (Amaryl) 4 mg: one tablet at breakfast Rosuvastatin 20 mg: one tablet at bedtime Metformin (Glucophage) 500 mg: two tablets at breakfast and two at dinner Lisinopril (Zestril) 10 mg: one tablet at dinner Pioglitazone (Actos) 45 mg: one tablet at breakfast  Patient asked to fill  One Touch Ultra Test Strips One Touch Delica Lancets    Confirmed delivery date of 04/27/22, advised patient that pharmacy will contact them the morning of delivery.   Care Gaps: Zoster  Vaccine - Overdue PNA Vaccine  - Overdue COVID Booster  - Overdue Flu Vaccine - Overdue CCM-8/23 BP- 126/66 03/16/22 AWV- 8/22 Lab Results  Component Value Date   HGBA1C 7.8 (A) 03/16/2022    [facility]: Jardiance 10 mg - Last filed 03/19/22 30 DS at [facility] Rosuvastatin 20 mg - Last filled 03/19/22 30 DS at [facility] Lisinopril 10 mg- Last filled 03/19/22 30 DS at [facility] Pioglitazone  45 mg - Last filled 03/19/22 30 DS at [facility] Metformin 500 mg - Last filled 03/19/22 30 DS at [facility] Ozempic 2 mg - obtained through [facility] Pharmacist Assistant [phone]

## 2022-04-24 ENCOUNTER — Telehealth: Payer: Self-pay | Admitting: Pharmacist

## 2022-04-24 NOTE — Progress Notes (Deleted)
Chronic Care Management Pharmacy Note  04/24/2022 Name:  Lucas Warren MRN:  [id number] DOB:  August 31, 1946  Summary: A1c not at goal < 7% but home blood sugars have improved  Recommendations/Changes made from today's visit: -Recommend alternating times of checking blood sugars 1-2 hours after eating -Recommended restarting fiber tablets  -Could consider decreasing glimepiride to 2 mg depending on A1c -Consider CGM for 2 week period -Recommend repeat lipid panel and BMET  Plan: -Follow up DM assessment in 2 weeks  Subjective: Lucas Warren is an 76 y.o. year old male who is a primary patient of Burchette, Alinda Sierras, MD.  The CCM team was consulted for assistance with disease management and care coordination needs.    Engaged with patient by telephone for follow up visit in response to provider referral for pharmacy case management and/or care coordination services.   Consent to Services:  The patient was given information about Chronic Care Management services, agreed to services, and gave verbal consent prior to initiation of services.  Please see initial visit note for detailed documentation.   Patient Care Team: [name], MD as PCP - General (Family Medicine) Jettie Booze, MD as PCP - Cardiology (Cardiology) Viona Gilmore, [facility] as Pharmacist (Pharmacist)  Recent office visits: 03/16/22 [name], MD - Patient presented for Poorly controlled type 2 diabetes mellitus and other concerns. Stopped Ipratropium Bromide. Referred to Allergist.  12/15/21 Burchette, Alinda Sierras, MD - Patient presented for Poorly controlled type 2 diabetes and other concerns.Increased Semaglutide   11/26/21 [place], Betty G, MD - Patient presented for Generalized abdominal pain and other concerns. Stopped Montelukast.  Recent consult visits: 03/04/22 Jettie Booze, MD (Cardiology) - Patient presented for CAD involving native coronary artery of native heart with other form of  angina pectoris and other concerns. No medication changes. Recommended 150 min of exercise a week & low fat/carb and high fiber diet.  Hospital visits: None in previous 6 months  Objective:  Lab Results  Component Value Date   CREATININE 1.61 (H) 11/26/2021   BUN 22 11/26/2021   GFR 42.05 (L) 11/26/2021   GFRNONAA 42 (L) 06/11/2020   GFRAA 48 (L) 06/11/2020   NA 139 11/26/2021   K 4.0 11/26/2021   CALCIUM 10.0 11/26/2021   CO2 22 11/26/2021   GLUCOSE 241 (H) 11/26/2021    Lab Results  Component Value Date/Time   HGBA1C 7.8 (A) 03/16/2022 08:22 AM   HGBA1C 8.1 (A) 12/15/2021 08:31 AM   HGBA1C 8.3 (H) 12/25/2015 08:39 AM   HGBA1C 8.4 10/14/2015 12:00 AM   HGBA1C 7.3 (H) 05/22/2015 09:53 AM   GFR 42.05 (L) 11/26/2021 11:14 AM   GFR 46.97 (L) 09/15/2021 08:57 AM   MICROALBUR 2.5 06/11/2020 08:23 AM   MICROALBUR 4.3 (H) 01/10/2018 08:54 AM    Last diabetic Eye exam:  Lab Results  Component Value Date/Time   HMDIABEYEEXA No Retinopathy 04/02/2020 12:00 AM    Last diabetic Foot exam:  Lab Results  Component Value Date/Time   HMDIABFOOTEX normal 03/25/2016 12:00 AM     Lab Results  Component Value Date   CHOL 109 09/15/2021   HDL 50.50 09/15/2021   LDLCALC 37 09/15/2021   TRIG 109.0 09/15/2021   CHOLHDL 2 09/15/2021       Latest Ref Rng & Units 11/26/2021   11:14 AM 09/15/2021    8:57 AM 06/11/2020    8:31 AM  Hepatic Function  Total Protein 6.0 - 8.3 g/dL 7.2  7.1  6.7   Albumin 3.5 - 5.2 g/dL 4.5  4.3    AST 0 - 37 U/L 11  10  12    ALT 0 - 53 U/L 9  6  8    Alk Phosphatase 39 - 117 U/L 44  39    Total Bilirubin 0.2 - 1.2 mg/dL 0.4  0.4  0.4   Bilirubin, Direct 0.0 - 0.3 mg/dL  0.1      Lab Results  Component Value Date/Time   TSH 0.71 11/26/2021 11:14 AM   TSH 0.86 07/24/2015 12:35 PM       Latest Ref Rng & Units 11/26/2021   11:14 AM 05/15/2020   10:45 AM 11/11/2018   12:56 PM  CBC  WBC 4.0 - 10.5 K/uL 7.4  8.3  9.1   Hemoglobin 13.0 - 17.0 g/dL  11.8  12.9  13.9   Hematocrit 39.0 - 52.0 % 35.7  38.6  42.0   Platelets 150.0 - 400.0 K/uL 218.0  227  222.0     No results found for: "VD25OH"  Clinical ASCVD: No  The ASCVD Risk score (Arnett DK, et al., 2019) failed to calculate for the following reasons:   The valid total cholesterol range is 130 to 320 mg/dL       11/26/2021   10:39 AM 06/13/2021    8:37 AM 04/29/2021    9:13 AM  Depression screen PHQ 2/9  Decreased Interest 2 0 0  Down, Depressed, Hopeless 0 0 0  PHQ - 2 Score 2 0 0  Altered sleeping 0    Tired, decreased energy 1    Change in appetite 1    Feeling bad or failure about yourself  0    Trouble concentrating 0    Moving slowly or fidgety/restless 0    Suicidal thoughts 0    PHQ-9 Score 4    Difficult doing work/chores Not difficult at all        Social History   Tobacco Use  Smoking Status Former   Packs/day: 2.00   Years: 20.00   Total pack years: 40.00   Types: Cigarettes   Quit date: 08/31/1981   Years since quitting: 40.6  Smokeless Tobacco Never   BP Readings from Last 3 Encounters:  03/16/22 126/66  03/04/22 102/60  12/15/21 106/60   Pulse Readings from Last 3 Encounters:  03/16/22 65  03/04/22 71  12/15/21 72   Wt Readings from Last 3 Encounters:  03/16/22 201 lb 8 oz (91.4 kg)  03/04/22 201 lb (91.2 kg)  12/15/21 202 lb (91.6 kg)   BMI Readings from Last 3 Encounters:  03/16/22 27.33 kg/m  03/04/22 27.26 kg/m  12/15/21 27.40 kg/m    Assessment/Interventions: Review of patient past medical history, allergies, medications, health status, including review of consultants reports, laboratory and other test data, was performed as part of comprehensive evaluation and provision of chronic care management services.   SDOH:  (Social Determinants of Health) assessments and interventions performed: No   CCM Care Plan  Allergies  Allergen Reactions   Lisinopril Cough   Erythromycin     REACTION: abd pain   Trulicity  [Dulaglutide] Nausea Only    Medications Reviewed Today     Reviewed by [name], MD (Physician) on 03/16/22 at [phone]  Med List Status: <None>   Medication Order Taking? Sig Documenting Provider Last Dose Status Informant  aspirin 81 MG tablet [id number] Yes Take 1 tablet by mouth at bedtime.  [provider]  Taking Active Self  glimepiride (AMARYL) 4 MG tablet [id number] Yes TAKE ONE TABLET BY MOUTH EVERY MORNING  Patient taking differently: 2 mg. TAKE ONE HALF TABLET BY MOUTH EVERY MORNING   [name], MD Taking Active   Discontinued 03/16/22 0834   JARDIANCE 10 MG TABS tablet [id number] Yes TAKE ONE TABLET BY MOUTH EVERY MORNING Burchette, Alinda Sierras, MD Taking Active   Lancets [facility] Donaciano Eva PLUS [id number]) [place] [id number] Yes USE ONE TO CHECK GLUCOSE TWICE DAILY Burchette, Alinda Sierras, MD Taking Active   lisinopril (ZESTRIL) 10 MG tablet [id number] Yes TAKE ONE TABLET BY MOUTH EVERY EVENING Burchette, Alinda Sierras, MD Taking Active   metFORMIN (GLUCOPHAGE) 500 MG tablet [id number] Yes TAKE TWO TABLETS BY MOUTH TWICE DAILY with a meal Burchette, Alinda Sierras, MD Taking Active   nitroGLYCERIN (NITROSTAT) 0.4 MG SL tablet [id number] Yes Take one tablet sublingual as needed for chest pain up to three doses as needed. [name], MD Taking Active   [facility] ULTRA test strip [id number] Yes USE ONE STRIP TWICE DAILY TO CHECK BLOOD SUGARS [name], MD Taking Active   pioglitazone (ACTOS) 45 MG tablet [id number] Yes TAKE ONE TABLET BY MOUTH EVERY MORNING Burchette, Alinda Sierras, MD Taking Active   rosuvastatin (CRESTOR) 20 MG tablet [id number] Yes TAKE ONE TABLET BY MOUTH EVERYDAY AT BEDTIME Jettie Booze, MD Taking Active   Semaglutide, 2 MG/DOSE, 8 MG/3ML SOPN [id number] Yes Inject 2 mg as directed once a week. [name], MD Taking Active   tadalafil (CIALIS) 20 MG tablet [id number] Yes TAKE ONE TABLET BY MOUTH every other DAY AS NEEDED Burchette, Alinda Sierras, MD Taking  Active   vitamin B-12 (CYANOCOBALAMIN) 1000 MCG tablet [id number] Yes Take 1,000 mcg by mouth at bedtime.  [provider] Taking Active Self            Patient Active Problem List   Diagnosis Date Noted   Erectile dysfunction 03/11/2020   CKD (chronic kidney disease) stage 3, GFR 30-59 ml/min (HCC) 11/06/2019   Laryngopharyngeal reflux (LPR) 06/12/2019   Perennial allergic rhinitis 06/12/2019   Chronic right shoulder pain 09/24/2016   Cough 08/19/2016   Hemoptysis 08/19/2016   Normocytic anemia 06/29/2016   Hypertension 12/25/2015   Obesity (BMI 30-39.9) 02/02/2014   Acute URI 10/10/2012   Overweight 04/05/2012   Venous insufficiency 04/02/2011   HEADACHE 02/25/2008   CHEST PAIN, ATYPICAL 02/25/2008   HYPERCHOLESTEROLEMIA 02/24/2008   Poorly controlled type 2 diabetes mellitus ([place]) 02/23/2008   ANXIETY 02/23/2008   GERD 02/23/2008   PSORIASIS 02/23/2008   DEGENERATIVE JOINT DISEASE 02/23/2008   COLONIC POLYPS, HX OF 02/23/2008    Immunization History  Administered Date(s) Administered   Fluad Quad(high Dose 65+) 05/09/2019, 06/11/2020   Influenza Split 10/05/2011   Influenza Whole 07/16/2009   Influenza, High Dose Seasonal PF 05/22/2015, 06/29/2016, 06/15/2017, 05/20/2018   Influenza,inj,Quad PF,6+ Mos 07/07/2013, 05/04/2014   PFIZER(Purple Top)SARS-COV-2 Vaccination 10/22/2019, 11/15/2019, 07/09/2020   Pneumococcal Conjugate-13 08/21/2014   Pneumococcal Polysaccharide-23 12/05/2012   Pneumococcal-Unspecified 10/11/2007   Td 03/09/2006   Tdap 03/25/2016   Zoster, Live 03/31/2016   Patient report he doesn't go to the bathroom as often as he was before. He sometimes goes days without having a BM. This has been ongoing for a while. He was taking fiber tablets at one point and had stopped.  -getting higher dose of ozempic from [facility]?  -increase jardiance?  -trial of insulin with CGM?   Conditions to be addressed/monitored:  Hypertension,  Hyperlipidemia,  Diabetes, Chronic Kidney Disease and ED  Conditions addressed this visit: Diabetes, hyperlipidemia  There are no care plans that you recently modified to display for this patient.   Medication Assistance:  Ozempic obtained through [employer] medication assistance program.  Enrollment ends 08/30/22    Compliance/Adherence/Medication fill history: Care Gaps: Shingrix, COVID booster, influenza, Prevnar 20 BP: 126/66 03/16/22 A1c: 7.8% (03/16/22)  Star-Rating Drugs: Glimepiride 4 mg - Last filled 04/22/22 30 DS at [facility] Jardiance 10 mg - Last filed 04/22/22 30 DS at [facility] Rosuvastatin 20 mg - Last filled 04/22/22 30 DS at [facility] Lisinopril 10 mg- Last filled 04/22/22 30 DS at [facility] Pioglitazone 45 mg - Last filled 04/22/22 30 DS at [facility] Metformin 500 mg - Last filled 04/22/22 30 DS at [facility] Ozempic 2 mg - obtained through [facility]  Patient's preferred pharmacy is:  [facility] [place], [place] - [address] [place] [address] Phone: [phone] Fax: [phone]   Uses pill box? No - adherence packaging Pt endorses 100% compliance  We discussed: Benefits of medication synchronization, packaging and delivery as well as enhanced pharmacist oversight with [facility]. Patient decided to: Utilize [facility] pharmacy for medication synchronization, packaging and delivery  Care Plan and Follow Up Patient Decision:  Patient agrees to Care Plan and Follow-up.  Plan: The care management team will reach out to the patient again over the next 30 days.  [name], PharmD [facility] Clinical Pharmacist [place] [facility]

## 2022-04-24 NOTE — Chronic Care Management (AMB) (Signed)
    Chronic Care Management Pharmacy Assistant   Name: Lucas Warren  MRN: [id number] DOB: Aug 15, 1946 04/24/22 APPOINTMENT REMINDER    Patient  was reminded to have all medications, supplements and any blood glucose and blood pressure readings available for review with [name], Pharm. D, for telephone visit on 04/27/22 at 10:30.    Care Gaps: Zoster Vaccine - Overdue PNA Vaccine - Overdue COVID Booster - Overdue Flu Vaccine - Overdue BP- 126/66 03/16/22 AWV- 8/22 Lab Results  Component Value Date   HGBA1C 7.8 (A) 03/16/2022    Star Rating Drug: Glimepiride 4 mg - Last filled 04/22/22 30 DS at [facility] Jardiance 10 mg - Last filed 04/22/22 30 DS at [facility] Rosuvastatin 20 mg - Last filled 04/22/22 30 DS at [facility] Lisinopril 10 mg- Last filled 04/22/22 30 DS at [facility] Pioglitazone 45 mg - Last filled 04/22/22 30 DS at [facility] Metformin 500 mg - Last filled 04/22/22 30 DS at [facility] Ozempic 2 mg - obtained through [facility]    Medications: Outpatient Encounter Medications as of 04/24/2022  Medication Sig   aspirin 81 MG tablet Take 1 tablet by mouth at bedtime.    glimepiride (AMARYL) 4 MG tablet TAKE ONE TABLET BY MOUTH EVERY MORNING (Patient taking differently: 2 mg. TAKE ONE HALF TABLET BY MOUTH EVERY MORNING)   JARDIANCE 10 MG TABS tablet TAKE ONE TABLET BY MOUTH EVERY MORNING   Lancets (ONETOUCH DELICA PLUS [id number]) MISC USE ONE TO CHECK GLUCOSE TWICE DAILY   lisinopril (ZESTRIL) 10 MG tablet TAKE ONE TABLET BY MOUTH EVERY EVENING   metFORMIN (GLUCOPHAGE) 1000 MG tablet Take 1 tablet (1,000 mg total) by mouth 2 (two) times daily with a meal.   nitroGLYCERIN (NITROSTAT) 0.4 MG SL tablet Take one tablet sublingual as needed for chest pain up to three doses as needed.   ONETOUCH ULTRA test strip USE ONE STRIP TWICE DAILY TO CHECK BLOOD SUGARS   pioglitazone (ACTOS) 45 MG tablet TAKE ONE TABLET BY MOUTH EVERY MORNING   rosuvastatin (CRESTOR) 20 MG tablet TAKE ONE TABLET  BY MOUTH EVERYDAY AT BEDTIME   Semaglutide, 2 MG/DOSE, 8 MG/3ML SOPN Inject 2 mg as directed once a week.   tadalafil (CIALIS) 20 MG tablet TAKE ONE TABLET BY MOUTH every other DAY AS NEEDED   vitamin B-12 (CYANOCOBALAMIN) 1000 MCG tablet Take 1,000 mcg by mouth at bedtime.    No facility-administered encounter medications on file as of 04/24/2022.     [facility] Clinical Pharmacist Assistant [phone]

## 2022-04-27 ENCOUNTER — Telehealth: Payer: Medicare Other

## 2022-04-30 ENCOUNTER — Ambulatory Visit (INDEPENDENT_AMBULATORY_CARE_PROVIDER_SITE_OTHER): Payer: Medicare Other

## 2022-04-30 VITALS — Ht 72.0 in | Wt 197.0 lb

## 2022-04-30 DIAGNOSIS — Z0000 Encounter for general adult medical examination without abnormal findings: Secondary | ICD-10-CM | POA: Diagnosis not present

## 2022-04-30 NOTE — Progress Notes (Signed)
Subjective:   Lucas Warren is a 76 y.o. male who presents for Medicare Annual/Subsequent preventive examination.  Review of Systems    Virtual Visit via Telephone Note  I connected with  Lucas Warren on 04/30/22 at  9:15 AM EDT by telephone and verified that I am speaking with the correct person using two identifiers.  Location: Patient: Home Provider: Office Persons participating in the virtual visit: patient/Nurse Health Advisor   I discussed the limitations, risks, security and privacy concerns of performing an evaluation and management service by telephone and the availability of in person appointments. The patient expressed understanding and agreed to proceed.  Interactive audio and video telecommunications were attempted between this nurse and patient, however failed, due to patient having technical difficulties OR patient did not have access to video capability.  We continued and completed visit with audio only.  Some vital signs may be absent or patient reported.   Criselda Peaches, LPN  Cardiac Risk Factors include: advanced age (>61mn, >>52women)     Objective:    Today's Vitals   04/30/22 0925  Weight: 197 lb (89.4 kg)  Height: 6' (1.829 m)   Body mass index is 26.72 kg/m.     04/30/2022    9:33 AM 04/29/2021    9:10 AM 05/20/2020    7:06 AM 04/26/2020    9:08 AM 01/18/2018    9:31 AM 01/06/2017    8:35 AM 03/25/2016    7:22 AM  Advanced Directives  Does Patient Have a Medical Advance Directive? Yes Yes Yes Yes Yes Yes Yes  Type of [place]of [place] will Healthcare Power of [place] will   Living will;Healthcare Power of Attorney  Does patient want to make changes to medical advance directive?    No - Patient declined     Copy of [place] Chart? No - copy requested No - copy requested  No - copy requested       Current  Medications (verified) Outpatient Encounter Medications as of 04/30/2022  Medication Sig   aspirin 81 MG tablet Take 1 tablet by mouth at bedtime.    glimepiride (AMARYL) 4 MG tablet TAKE ONE TABLET BY MOUTH EVERY MORNING (Patient taking differently: 2 mg. TAKE ONE HALF TABLET BY MOUTH EVERY MORNING)   JARDIANCE 10 MG TABS tablet TAKE ONE TABLET BY MOUTH EVERY MORNING   Lancets (ONETOUCH DELICA PLUS [id number] MISC USE ONE TO CHECK GLUCOSE TWICE DAILY   lisinopril (ZESTRIL) 10 MG tablet TAKE ONE TABLET BY MOUTH EVERY EVENING   metFORMIN (GLUCOPHAGE) 1000 MG tablet Take 1 tablet (1,000 mg total) by mouth 2 (two) times daily with a meal.   nitroGLYCERIN (NITROSTAT) 0.4 MG SL tablet Take one tablet sublingual as needed for chest pain up to three doses as needed.   ONETOUCH ULTRA test strip USE ONE STRIP TWICE DAILY TO CHECK BLOOD SUGARS   pioglitazone (ACTOS) 45 MG tablet TAKE ONE TABLET BY MOUTH EVERY MORNING   rosuvastatin (CRESTOR) 20 MG tablet TAKE ONE TABLET BY MOUTH EVERYDAY AT BEDTIME   Semaglutide, 2 MG/DOSE, 8 MG/3ML SOPN Inject 2 mg as directed once a week.   tadalafil (CIALIS) 20 MG tablet TAKE ONE TABLET BY MOUTH every other DAY AS NEEDED   vitamin B-12 (CYANOCOBALAMIN) 1000 MCG tablet Take 1,000 mcg by mouth at bedtime.    No facility-administered encounter medications on file as of 04/30/2022.  Allergies (verified) Lisinopril, Erythromycin, and Trulicity [dulaglutide]   History: Past Medical History:  Diagnosis Date   Allergy    seasonal   Anxiety    Chest pain, atypical    Colon polyps    hyperplastic 2003   DJD (degenerative joint disease)    DM (diabetes mellitus) ([place])    GERD (gastroesophageal reflux disease)    History of headache    Hypercholesteremia    Psoriasis    Past Surgical History:  Procedure Laterality Date   CATARACT EXTRACTION, BILATERAL Bilateral    Dr. Tommy Rainwater;    [place] CATH AND CORONARY ANGIOGRAPHY N/A  05/20/2020   Procedure: LEFT HEART CATH AND CORONARY ANGIOGRAPHY;  Surgeon: Jettie Booze, MD;  Location: [facility];  Service: Cardiovascular;  Laterality: N/A;   NASAL SEPTUM SURGERY  1989   Family History  Problem Relation Age of Onset   Lung cancer Mother    Alzheimer's disease Father    Heart disease Father    Leukemia Sister    Social History   Socioeconomic History   Marital status: Divorced    Spouse name: carolyn   Number of children: Not on file   Years of education: Not on file   Highest education level: Not on file  Occupational History    Comment: Patent examiner  Tobacco Use   Smoking status: Former    Packs/day: 2.00    Years: 20.00    Total pack years: 40.00    Types: Cigarettes    Quit date: 08/31/1981    Years since quitting: 40.6   Smokeless tobacco: Never  Vaping Use   Vaping Use: Never used  Substance and Sexual Activity   Alcohol use: Yes    Comment: rarely a beer   Drug use: No   Sexual activity: Not on file  Other Topics Concern   Not on file  Social History Narrative   Not on file   Social Determinants of Health   Financial Resource Strain: Low Risk  (04/30/2022)   Overall Financial Resource Strain (CARDIA)    Difficulty of Paying Living Expenses: Not hard at all  Food Insecurity: No Food Insecurity (04/30/2022)   Hunger Vital Sign    Worried About Running Out of Food in the Last Year: Never true    [place] in the Last Year: Never true  Transportation Needs: No Transportation Needs (04/30/2022)   PRAPARE - Hydrologist (Medical): No    Lack of Transportation (Non-Medical): No  Physical Activity: Inactive (04/30/2022)   Exercise Vital Sign    Days of Exercise per Week: 0 days    Minutes of Exercise per Session: 0 min  Stress: No Stress Concern Present (04/30/2022)   Gleed    Feeling of Stress : Not at all  Social  Connections: Moderately Integrated (04/30/2022)   Social Connection and Isolation Panel [NHANES]    Frequency of Communication with Friends and Family: More than three times a week    Frequency of Social Gatherings with Friends and Family: More than three times a week    Attends Religious Services: More than 4 times per year    Active Member of [employer] or Organizations: Yes    Attends Music therapist: More than 4 times per year    Marital Status: Divorced    Tobacco Counseling Counseling given: Not Answered  Clinical Intake:  Pre-visit preparation completed: NoNutrition Risk Assessment:  Has the patient had any N/V/D within the last 2 months?  No  Does the patient have any non-healing wounds?  No  Has the patient had any unintentional weight loss or weight gain?  No   Diabetes:  Is the patient diabetic?  Yes  If diabetic, was a CBG obtained today?  Yes CBG 89 Taken by patient Did the patient bring in their glucometer from home?  No  How often do you monitor your CBG's? Daily.   Financial Strains and Diabetes Management:  Are you having any financial strains with the device, your supplies or your medication? No .  Does the patient want to be seen by Chronic Care Management for management of their diabetes?  No  Would the patient like to be referred to a Nutritionist or for Diabetic Management?  No   Diabetic Exams:  Diabetic Eye Exam: Completed No. Overdue for diabetic eye exam. Pt has been advised about the importance in completing this exam. A referral has been placed today. Message sent to referral coordinator for scheduling purposes. Advised pt to expect a call from office referred to regarding appt.  Diabetic Foot Exam: Completed No. Pt has been advised about the importance in completing this exam. Pt is scheduled for diabetic foot exam on Followed by PCP.    Diabetic?  Yes  Interpreter Needed?: NoActivities of Daily Living    04/30/2022    9:31 AM  In  your present state of health, do you have any difficulty performing the following activities:  Hearing? 0  Vision? 0  Difficulty concentrating or making decisions? 0  Walking or climbing stairs? 0  Dressing or bathing? 0  Doing errands, shopping? 0  Preparing Food and eating ? N  Using the Toilet? N  In the past six months, have you accidently leaked urine? N  Do you have problems with loss of bowel control? N  Managing your Medications? N  Managing your Finances? N  Housekeeping or managing your Housekeeping? N    Patient Care Team: [name], MD as PCP - General (Family Medicine) Jettie Booze, MD as PCP - Cardiology (Cardiology) Viona Gilmore, [facility] as Pharmacist (Pharmacist)  Indicate any recent Medical Services you may have received from other than [facility] providers in the past year (date may be approximate).     Assessment:   This is a routine wellness examination for [place].  Hearing/Vision screen Hearing Screening - Comments:: Denies hearing difficulties   Vision Screening - Comments:: Wears reading glasses - up to date with routine eye exams with  Dr Einar Gip  Dietary issues and exercise activities discussed: Current Exercise Habits: Home exercise routine, Type of exercise: walking, Time (Minutes): 10, Frequency (Times/Week): 1, Weekly Exercise (Minutes/Week): 10, Intensity: Mild, Exercise limited by: None identified   Goals Addressed               This Visit's Progress     Maintain weight (pt-stated)         Depression Screen    04/30/2022    9:29 AM 11/26/2021   10:39 AM 06/13/2021    8:37 AM 04/29/2021    9:13 AM 04/29/2021    9:07 AM 04/26/2020    9:12 AM 11/06/2019    8:24 AM  PHQ 2/9 Scores  PHQ - 2 Score 0 2 0 0 0 0 0  PHQ- 9 Score  4    0 0    Fall  Risk    04/30/2022    9:32 AM 11/26/2021   10:40 AM 04/29/2021    9:13 AM 04/26/2020    9:10 AM 11/06/2019    8:24 AM  Fall Risk   Falls in the past year? 0 1 0 0 0  Number falls in  past yr: 0 0 0 0   Injury with Fall? 0 0 0 0   Risk for fall due to : No Fall Risks No Fall Risks No Fall Risks Medication side effect   Follow up Falls prevention discussed Falls evaluation completed Falls evaluation completed Falls evaluation completed;Falls prevention discussed     FALL RISK PREVENTION PERTAINING TO THE HOME:  Any stairs in or around the home? Yes  If so, are there any without handrails? No  Home free of loose throw rugs in walkways, pet beds, electrical cords, etc? Yes  Adequate lighting in your home to reduce risk of falls? Yes   ASSISTIVE DEVICES UTILIZED TO PREVENT FALLS:  Life alert? No  Use of a cane, walker or w/c? No  Grab bars in the bathroom? Yes  Shower chair or bench in shower? No  Elevated toilet seat or a handicapped toilet? Yes  TIMED UP AND GO:  Was the test performed? No . Audio Visit  Cognitive Function:        04/30/2022    9:33 AM  6CIT Screen  What Year? 0 points  What month? 0 points  What time? 0 points  Count back from 20 0 points  Months in reverse 0 points  Repeat phrase 0 points  Total Score 0 points    Immunizations Immunization History  Administered Date(s) Administered   Fluad Quad(high Dose 65+) 05/09/2019, 06/11/2020   Influenza Split 10/05/2011   Influenza Whole 07/16/2009   Influenza, High Dose Seasonal PF 05/22/2015, 06/29/2016, 06/15/2017, 05/20/2018   Influenza,inj,Quad PF,6+ Mos 07/07/2013, 05/04/2014   PFIZER(Purple Top)SARS-COV-2 Vaccination 10/22/2019, 11/15/2019, 07/09/2020   Pneumococcal Conjugate-13 08/21/2014   Pneumococcal Polysaccharide-23 12/05/2012   Pneumococcal-Unspecified 10/11/2007   Td 03/09/2006   Tdap 03/25/2016   Zoster, Live 03/31/2016    TDAP status: Up to date  Flu Vaccine status: Due, Education has been provided regarding the importance of this vaccine. Advised may receive this vaccine at local pharmacy or Health Dept. Aware to provide a copy of the vaccination record if  obtained from local pharmacy or Health Dept. Verbalized acceptance and understanding.  Pneumococcal vaccine status: Due, Education has been provided regarding the importance of this vaccine. Advised may receive this vaccine at local pharmacy or Health Dept. Aware to provide a copy of the vaccination record if obtained from local pharmacy or Health Dept. Verbalized acceptance and understanding.  Covid-19 vaccine status: Completed vaccines  Qualifies for Shingles Vaccine? Yes   Zostavax completed No   Shingrix Completed?: No.    Education has been provided regarding the importance of this vaccine. Patient has been advised to call insurance company to determine out of pocket expense if they have not yet received this vaccine. Advised may also receive vaccine at local pharmacy or Health Dept. Verbalized acceptance and understanding.  Screening Tests Health Maintenance  Topic Date Due   INFLUENZA VACCINE  03/31/2022   COVID-19 Vaccine (4 - Pfizer series) 05/16/2022 (Originally 09/03/2020)   Zoster Vaccines- Shingrix (1 of 2) 07/30/2022 (Originally 01/11/1998)   Pneumonia Vaccine 79+ Years old (3 - PPSV23 or PCV20) 05/01/2023 (Originally 12/05/2017)   OPHTHALMOLOGY EXAM  05/14/2022   COLONOSCOPY (Pts 45-98yr Insurance coverage will  need to be confirmed)  06/10/2022   HEMOGLOBIN A1C  09/16/2022   FOOT EXAM  12/16/2022   TETANUS/TDAP  03/25/2026   Hepatitis C Screening  Completed   HPV VACCINES  Aged Out    Health Maintenance  Health Maintenance Due  Topic Date Due   INFLUENZA VACCINE  03/31/2022    Colorectal cancer screening: Type of screening: Colonoscopy. Completed 06/10/12. Repeat every 10 years  Lung Cancer Screening: (Low Dose CT Chest recommended if Age 26-80 years, 30 pack-year currently smoking OR have quit w/in 15years.) does not qualify.     Additional Screening:  Hepatitis C Screening: does qualify; Completed 03/25/16  Vision Screening: Recommended annual ophthalmology  exams for early detection of glaucoma and other disorders of the eye. Is the patient up to date with their annual eye exam?  Yes  Who is the provider or what is the name of the office in which the patient attends annual eye exams? Dr Einar Gip If pt is not established with a provider, would they like to be referred to a provider to establish care? No .   Dental Screening: Recommended annual dental exams for proper oral hygiene  Community Resource Referral / Chronic Care Management:  CRR required this visit?  No   CCM required this visit?  No      Plan:     I have personally reviewed and noted the following in the patient's chart:   Medical and social history Use of alcohol, tobacco or illicit drugs  Current medications and supplements including opioid prescriptions. Patient is not currently taking opioid prescriptions. Functional ability and status Nutritional status Physical activity Advanced directives List of other physicians Hospitalizations, surgeries, and ER visits in previous 12 months Vitals Screenings to include cognitive, depression, and falls Referrals and appointments  In addition, I have reviewed and discussed with patient certain preventive protocols, quality metrics, and best practice recommendations. A written personalized care plan for preventive services as well as general preventive health recommendations were provided to patient.     Criselda Peaches, LPN   [id number]   Nurse Notes: None

## 2022-04-30 NOTE — Patient Instructions (Addendum)
Lucas Warren , Thank you for taking time to come for your Medicare Wellness Visit. I appreciate your ongoing commitment to your health goals. Please review the following plan we discussed and let me know if I can assist you in the future.   Screening recommendations/referrals: Colonoscopy: Done 06/10/12 Repeat 10 yrs Recommended yearly ophthalmology/optometry visit for glaucoma screening and checkup Recommended yearly dental visit for hygiene and checkup  Vaccinations: Influenza vaccine: Due Pneumococcal vaccine: Due Tdap vaccine: Up to date Shingles vaccine: Due   Covid-19: Done  Advanced directives: Please bring a copy of your health care power of attorney and living will to the office to be added to your chart at your convenience.   Conditions/risks identified: None  Next appointment: Follow up in one year for your annual wellness visit.    Preventive Care 74 Years and Older, Male  Preventive care refers to lifestyle choices and visits with your health care provider that can promote health and wellness. What does preventive care include? A yearly physical exam. This is also called an annual well check. Dental exams once or twice a year. Routine eye exams. Ask your health care provider how often you should have your eyes checked. Personal lifestyle choices, including: Daily care of your teeth and gums. Regular physical activity. Eating a healthy diet. Avoiding tobacco and drug use. Limiting alcohol use. Practicing safe sex. Taking low doses of aspirin every day. Taking vitamin and mineral supplements as recommended by your health care provider. What happens during an annual well check? The services and screenings done by your health care provider during your annual well check will depend on your age, overall health, lifestyle risk factors, and family history of disease. Counseling  Your health care provider may ask you questions about your: Alcohol use. Tobacco use. Drug  use. Emotional well-being. Home and relationship well-being. Sexual activity. Eating habits. History of falls. Memory and ability to understand (cognition). Work and work Statistician. Screening  You may have the following tests or measurements: Height, weight, and BMI. Blood pressure. Lipid and cholesterol levels. These may be checked every 5 years, or more frequently if you are over 99 years old. Skin check. Lung cancer screening. You may have this screening every year starting at age 48 if you have a 30-pack-year history of smoking and currently smoke or have quit within the past 15 years. Fecal occult blood test (FOBT) of the stool. You may have this test every year starting at age 28. Flexible sigmoidoscopy or colonoscopy. You may have a sigmoidoscopy every 5 years or a colonoscopy every 10 years starting at age 48. Prostate cancer screening. Recommendations will vary depending on your family history and other risks. Hepatitis C blood test. Hepatitis B blood test. Sexually transmitted disease (STD) testing. Diabetes screening. This is done by checking your blood sugar (glucose) after you have not eaten for a while (fasting). You may have this done every 1-3 years. Abdominal aortic aneurysm (AAA) screening. You may need this if you are a current or former smoker. Osteoporosis. You may be screened starting at age 55 if you are at high risk. Talk with your health care provider about your test results, treatment options, and if necessary, the need for more tests. Vaccines  Your health care provider may recommend certain vaccines, such as: Influenza vaccine. This is recommended every year. Tetanus, diphtheria, and acellular pertussis (Tdap, Td) vaccine. You may need a Td booster every 10 years. Zoster vaccine. You may need this after age 69. Pneumococcal  13-valent conjugate (PCV13) vaccine. One dose is recommended after age 35. Pneumococcal polysaccharide (PPSV23) vaccine. One dose is  recommended after age 61. Talk to your health care provider about which screenings and vaccines you need and how often you need them. This information is not intended to replace advice given to you by your health care provider. Make sure you discuss any questions you have with your health care provider. Document Released: 09/13/2015 Document Revised: 05/06/2016 Document Reviewed: 06/18/2015 Elsevier Interactive Patient Education  2017 [place] Prevention in the Home Falls can cause injuries. They can happen to people of all ages. There are many things you can do to make your home safe and to help prevent falls. What can I do on the outside of my home? Regularly fix the edges of walkways and driveways and fix any cracks. Remove anything that might make you trip as you walk through a door, such as a raised step or threshold. Trim any bushes or trees on the path to your home. Use bright outdoor lighting. Clear any walking paths of anything that might make someone trip, such as rocks or tools. Regularly check to see if handrails are loose or broken. Make sure that both sides of any steps have handrails. Any raised decks and porches should have guardrails on the edges. Have any leaves, snow, or ice cleared regularly. Use sand or salt on walking paths during winter. Clean up any spills in your garage right away. This includes oil or grease spills. What can I do in the bathroom? Use night lights. Install grab bars by the toilet and in the tub and shower. Do not use towel bars as grab bars. Use non-skid mats or decals in the tub or shower. If you need to sit down in the shower, use a plastic, non-slip stool. Keep the floor dry. Clean up any water that spills on the floor as soon as it happens. Remove soap buildup in the tub or shower regularly. Attach bath mats securely with double-sided non-slip rug tape. Do not have throw rugs and other things on the floor that can make you  trip. What can I do in the bedroom? Use night lights. Make sure that you have a light by your bed that is easy to reach. Do not use any sheets or blankets that are too big for your bed. They should not hang down onto the floor. Have a firm chair that has side arms. You can use this for support while you get dressed. Do not have throw rugs and other things on the floor that can make you trip. What can I do in the kitchen? Clean up any spills right away. Avoid walking on wet floors. Keep items that you use a lot in easy-to-reach places. If you need to reach something above you, use a strong step stool that has a grab bar. Keep electrical cords out of the way. Do not use floor polish or wax that makes floors slippery. If you must use wax, use non-skid floor wax. Do not have throw rugs and other things on the floor that can make you trip. What can I do with my stairs? Do not leave any items on the stairs. Make sure that there are handrails on both sides of the stairs and use them. Fix handrails that are broken or loose. Make sure that handrails are as long as the stairways. Check any carpeting to make sure that it is firmly attached to the stairs. Fix any carpet  that is loose or worn. Avoid having throw rugs at the top or bottom of the stairs. If you do have throw rugs, attach them to the floor with carpet tape. Make sure that you have a light switch at the top of the stairs and the bottom of the stairs. If you do not have them, ask someone to add them for you. What else can I do to help prevent falls? Wear shoes that: Do not have high heels. Have rubber bottoms. Are comfortable and fit you well. Are closed at the toe. Do not wear sandals. If you use a stepladder: Make sure that it is fully opened. Do not climb a closed stepladder. Make sure that both sides of the stepladder are locked into place. Ask someone to hold it for you, if possible. Clearly mark and make sure that you can  see: Any grab bars or handrails. First and last steps. Where the edge of each step is. Use tools that help you move around (mobility aids) if they are needed. These include: Canes. Walkers. Scooters. Crutches. Turn on the lights when you go into a dark area. Replace any light bulbs as soon as they burn out. Set up your furniture so you have a clear path. Avoid moving your furniture around. If any of your floors are uneven, fix them. If there are any pets around you, be aware of where they are. Review your medicines with your doctor. Some medicines can make you feel dizzy. This can increase your chance of falling. Ask your doctor what other things that you can do to help prevent falls. This information is not intended to replace advice given to you by your health care provider. Make sure you discuss any questions you have with your health care provider. Document Released: 06/13/2009 Document Revised: 01/23/2016 Document Reviewed: 09/21/2014 Elsevier Interactive Patient Education  2017 [employer].

## 2022-05-06 ENCOUNTER — Ambulatory Visit: Payer: Medicare Other | Admitting: Allergy

## 2022-05-06 ENCOUNTER — Encounter: Payer: Self-pay | Admitting: Allergy

## 2022-05-06 VITALS — BP 112/68 | HR 74 | Temp 98.8°F | Resp 18 | Ht 71.0 in | Wt 204.8 lb

## 2022-05-06 DIAGNOSIS — J310 Chronic rhinitis: Secondary | ICD-10-CM | POA: Diagnosis not present

## 2022-05-06 DIAGNOSIS — J300 Vasomotor rhinitis: Secondary | ICD-10-CM

## 2022-05-06 MED ORDER — IPRATROPIUM BROMIDE 0.06 % NA SOLN
NASAL | 5 refills | Status: DC
Start: 2022-05-06 — End: 2023-08-19

## 2022-05-06 NOTE — Progress Notes (Signed)
New Patient Note  RE: Lucas Warren MRN: [id number] DOB: Jan 05, 1946 Date of Office Visit: 05/06/2022  Primary care provider: [name], MD  Chief Complaint: runny nose  History of present illness: Lucas Warren is a 76 y.o. male presenting today for evaluation of nasal drainage.   He has had nasal drainage for years.  This year has been worse than previous years for the drainage.  He has both anterior and posterior drainage.  When he gets up in the morning his nose will start dripping.  He blows his nose several times. Any time he eats the drainage is worse.   He can taste but can not smell very well. He did buy a nasal saline rinse lavage but has not tried this out yet.  He has tried a variety of different medications.  He has tried nasal atrovent 0.06% spray however was only using it once a day. He has also tried nasocort and astelin nasal sprays. He states one of the over-the-counter nasal sprays caused his nose to bleed.  He has also tried antihistamines zyrtec, claritin, allegra.  Zyrtec worked the best but this year has not worked much at all.  He states he even took down a bunch of trees in his yard (including oak trees) to see if this would help control symptoms better; it has not.  He had his deviated septum repaired.   No history of asthma, eczema, food allergy.  He does report dry skin that it is itchy.  Moisturizes with Eucerin.  Review of systems: Review of Systems  Constitutional: Negative.   HENT:         See HPI  Eyes: Negative.   Respiratory: Negative.    Cardiovascular: Negative.   Musculoskeletal: Negative.   Skin: Negative.   Allergic/Immunologic: Negative.   Neurological: Negative.     All other systems negative unless noted above in HPI  Past medical history: Past Medical History:  Diagnosis Date   Allergy    seasonal   Anxiety    Chest pain, atypical    Colon polyps    hyperplastic 2003   DJD (degenerative joint disease)    DM  (diabetes mellitus) ([place])    GERD (gastroesophageal reflux disease)    History of headache    Hypercholesteremia    Psoriasis     Past surgical history: Past Surgical History:  Procedure Laterality Date   CATARACT EXTRACTION, BILATERAL Bilateral    Dr. Tommy Rainwater;    [place] CATH AND CORONARY ANGIOGRAPHY N/A 05/20/2020   Procedure: LEFT HEART CATH AND CORONARY ANGIOGRAPHY;  Surgeon: Jettie Booze, MD;  Location: [facility];  Service: Cardiovascular;  Laterality: N/A;   NASAL SEPTUM SURGERY  1989   SINOSCOPY     TONSILLECTOMY      Family history:  Family History  Problem Relation Age of Onset   Lung cancer Mother    Alzheimer's disease Father    Heart disease Father    Leukemia Sister    Allergic rhinitis Neg Hx    Asthma Neg Hx    Eczema Neg Hx    Urticaria Neg Hx     Social history: Lives in a home with carpeting with gas and electric heating and central cooling.  No pets in the home.  There is no concern for water damage, mildew or roaches in the home. Tobacco Use   Smoking status: Former    Packs/day: 2.00  Years: 20.00    Total pack years: 40.00    Types: Cigarettes    Quit date: 08/31/1981    Years since quitting: 40.7   Smokeless tobacco: Never  Vaping Use   Vaping Use: Never used     Medication List: Current Outpatient Medications  Medication Sig Dispense Refill   aspirin 81 MG tablet Take 1 tablet by mouth at bedtime.      glimepiride (AMARYL) 4 MG tablet TAKE ONE TABLET BY MOUTH EVERY MORNING (Patient taking differently: 2 mg. TAKE ONE HALF TABLET BY MOUTH EVERY MORNING) 90 tablet 3   JARDIANCE 10 MG TABS tablet TAKE ONE TABLET BY MOUTH EVERY MORNING 90 tablet 3   Lancets (ONETOUCH DELICA PLUS [id number]) MISC USE ONE TO CHECK GLUCOSE TWICE DAILY 180 each 3   lisinopril (ZESTRIL) 10 MG tablet TAKE ONE TABLET BY MOUTH EVERY EVENING 90 tablet 3   metFORMIN (GLUCOPHAGE) 1000 MG tablet Take 1 tablet (1,000 mg  total) by mouth 2 (two) times daily with a meal. 180 tablet 1   nitroGLYCERIN (NITROSTAT) 0.4 MG SL tablet Take one tablet sublingual as needed for chest pain up to three doses as needed. 20 tablet 0   ONETOUCH ULTRA test strip USE ONE STRIP TWICE DAILY TO CHECK BLOOD SUGARS 180 strip 3   pioglitazone (ACTOS) 45 MG tablet TAKE ONE TABLET BY MOUTH EVERY MORNING 90 tablet 0   rosuvastatin (CRESTOR) 20 MG tablet TAKE ONE TABLET BY MOUTH EVERYDAY AT BEDTIME 90 tablet 3   Semaglutide, 2 MG/DOSE, 8 MG/3ML SOPN Inject 2 mg as directed once a week. 3 mL 11   tadalafil (CIALIS) 20 MG tablet TAKE ONE TABLET BY MOUTH every other DAY AS NEEDED 30 tablet 3   vitamin B-12 (CYANOCOBALAMIN) 1000 MCG tablet Take 1,000 mcg by mouth at bedtime.      No current facility-administered medications for this visit.    Known medication allergies: Allergies  Allergen Reactions   Lisinopril Cough   Erythromycin     REACTION: abd pain   Trulicity [Dulaglutide] Nausea Only     Physical examination: Blood pressure 112/68, pulse 74, temperature 98.8 F (37.1 C), temperature source Temporal, resp. rate 18, height '5\' 11"'$  (1.803 m), weight 204 lb 12.8 oz (92.9 kg), SpO2 97 %.  General: Alert, interactive, in no acute distress. HEENT: PERRLA, TMs pearly gray, turbinates minimally edematous without discharge, post-pharynx non erythematous. Neck: Supple without lymphadenopathy. Lungs: Clear to auscultation without wheezing, rhonchi or rales. {no increased work of breathing. CV: Normal S1, S2 without murmurs. Abdomen: Nondistended, nontender. Skin: Warm and dry, without lesions or rashes. Extremities:  No clubbing, cyanosis or edema. Neuro:   Grossly intact.  Diagnositics/Labs: none today   Assessment and plan: Nasal drainage -chronic rhinitis and vasomotor rhinitis - appears you may have nasal drainage that is multifactorial with allergic rhinitis and vasomotor rhinitis - for allergic rhinitis recommend skin  testing for environmental allergens.  Will schedule you for skin testing visit. Hold all antihistamines for 3 days prior to this day - for vasomotor rhinitis the nerves in the nose become activated by things like eating.  - would try again nasal Atrovent at the 0.06% strength and use 2 sprays each nostril up to 4 times a day for symptoms relief - recommend performing nasal saline rinse/lavage to help clean out the nose/sinuses.  Then follow use with your medicated nose spray - if allergy testing is positive then you should benefit from an antihistamine and can try either Xyzal  or a prescription based antihistamine - if allergy testing is positive and if medication management is not effective then would consider course of allergen immunotherapy.   Follow-up for skin testing  I appreciate the opportunity to take part in Lucas Warren's care. Please do not hesitate to contact me with questions.  Sincerely,   Prudy Feeler, MD Allergy/Immunology [facility]

## 2022-05-06 NOTE — Patient Instructions (Addendum)
Nasal drainage - appears you may have nasal drainage that is multifactorial with allergic rhinitis and vasomotor rhinitis - for allergic rhinitis recommend skin testing for environmental allergens.  Will schedule you for skin testing visit. Hold all antihistamines for 3 days prior to this day - for vasomotor rhinitis the nerves in the nose become activated by things like eating.  - would try again nasal Atrovent at the 0.06% strength and use 2 sprays each nostril up to 4 times a day for symptoms relief - recommend performing nasal saline rinse/lavage to help clean out the nose/sinuses.  Then follow use with your medicated nose spray - if allergy testing is positive then you should benefit from an antihistamine and can try either Xyzal or a prescription based antihistamine - if allergy testing is positive and if medication management is not effective then would consider course of allergen immunotherapy.   Follow-up for skin testing

## 2022-05-13 ENCOUNTER — Other Ambulatory Visit: Payer: Self-pay | Admitting: Family Medicine

## 2022-05-14 ENCOUNTER — Telehealth: Payer: Self-pay | Admitting: Pharmacist

## 2022-05-14 DIAGNOSIS — D225 Melanocytic nevi of trunk: Secondary | ICD-10-CM | POA: Diagnosis not present

## 2022-05-14 DIAGNOSIS — D2372 Other benign neoplasm of skin of left lower limb, including hip: Secondary | ICD-10-CM | POA: Diagnosis not present

## 2022-05-14 DIAGNOSIS — L814 Other melanin hyperpigmentation: Secondary | ICD-10-CM | POA: Diagnosis not present

## 2022-05-14 DIAGNOSIS — D1801 Hemangioma of skin and subcutaneous tissue: Secondary | ICD-10-CM | POA: Diagnosis not present

## 2022-05-14 DIAGNOSIS — Z85828 Personal history of other malignant neoplasm of skin: Secondary | ICD-10-CM | POA: Diagnosis not present

## 2022-05-14 NOTE — Chronic Care Management (AMB) (Signed)
Chronic Care Management Pharmacy Assistant   Name: Lucas Warren  MRN: [id number] DOB: [birth year]   Reason for Encounter: Medication Review Medication Coordination   Recent office visits:  None  Recent consult visits:  05/06/22 Kennith Gain, MD (Allergy) - Patient presented for Chronic rhinitis and other concerns. Prescribed Ipratropium Bromide 0.06 %.   Hospital visits:  None in previous 6 months  Medications: Outpatient Encounter Medications as of 05/14/2022  Medication Sig   aspirin 81 MG tablet Take 1 tablet by mouth at bedtime.    glimepiride (AMARYL) 4 MG tablet TAKE ONE TABLET BY MOUTH EVERY MORNING (Patient taking differently: 2 mg. TAKE ONE HALF TABLET BY MOUTH EVERY MORNING)   ipratropium (ATROVENT) 0.06 % nasal spray Place 2 sprays each nostril up to 4 times a day for symptoms relief   JARDIANCE 10 MG TABS tablet TAKE ONE TABLET BY MOUTH EVERY MORNING   Lancets (ONETOUCH DELICA PLUS [id number]) MISC USE ONE TO CHECK GLUCOSE TWICE DAILY   lisinopril (ZESTRIL) 10 MG tablet TAKE ONE TABLET BY MOUTH EVERY EVENING   metFORMIN (GLUCOPHAGE) 1000 MG tablet Take 1 tablet (1,000 mg total) by mouth 2 (two) times daily with a meal.   nitroGLYCERIN (NITROSTAT) 0.4 MG SL tablet Take one tablet sublingual as needed for chest pain up to three doses as needed.   ONETOUCH ULTRA test strip USE ONE STRIP TWICE DAILY TO CHECK BLOOD SUGARS   pioglitazone (ACTOS) 45 MG tablet TAKE ONE TABLET BY MOUTH EVERY MORNING   rosuvastatin (CRESTOR) 20 MG tablet TAKE ONE TABLET BY MOUTH EVERYDAY AT BEDTIME   Semaglutide, 2 MG/DOSE, 8 MG/3ML SOPN Inject 2 mg as directed once a week.   tadalafil (CIALIS) 20 MG tablet TAKE ONE TABLET BY MOUTH every other DAY AS NEEDED   vitamin B-12 (CYANOCOBALAMIN) 1000 MCG tablet Take 1,000 mcg by mouth at bedtime.    No facility-administered encounter medications on file as of 05/14/2022.   Reviewed chart for medication changes ahead of medication  coordination call.  BP Readings from Last 3 Encounters:  05/06/22 112/68  03/16/22 126/66  03/04/22 102/60    Lab Results  Component Value Date   HGBA1C 7.8 (A) 03/16/2022     Patient obtains medications through Adherence Packaging  30 Days   Last adherence delivery included: Empagliflozin (Jardiance) 10 mg: one tablet at breakfast Glimepiride (Amaryl) 4 mg: one tablet at breakfast Rosuvastatin 20 mg: one tablet at bedtime Metformin (Glucophage) 500 mg: two tablets at breakfast and two at dinner Lisinopril (Zestril) 10 mg: one tablet at dinner Pioglitazone (Actos) 45 mg: one tablet at breakfast   Patient asked to fill  One Touch Ultra Test Strips One Touch Delica Lancets   Confirmed delivery date of 04/27/22, advised patient that pharmacy will contact them the morning of delivery.    Patient is due for next adherence delivery on: 05/26/22. Called patient and reviewed medications and coordinated delivery. Packs 30 DS  This delivery to include: Empagliflozin (Jardiance) 10 mg: one tablet at breakfast Glimepiride (Amaryl) 4 mg: one tablet at breakfast Rosuvastatin 20 mg: one tablet at bedtime Metformin (Glucophage) 500 mg: two tablets at breakfast and two at dinner Lisinopril (Zestril) 10 mg: one tablet at dinner Pioglitazone (Actos) 45 mg: one tablet at breakfast     Due 06/11/22 One Touch Ultra Test Strips One Touch Delica Lancets  ipratropium bromide 42 mcg (0.06 %) nasal spray 05/07/2022    Confirmed delivery date of 05/26/22, advised patient that pharmacy will  contact them the morning of delivery.    Care Gaps: Diabetic Kidney Eval (Urine) - Overdue Flu Vaccine - Overdue Eye Exam - Overdue COVID Booster - Postponed Zoster Vaccine - Postponed PNA Vaccine - Postponed CCM- 9/23 AWV- 8/23 BP- 112/68 05/06/22 Lab Results  Component Value Date   HGBA1C 7.8 (A) 03/16/2022    [facility] Drugs: Jardiance 10 mg - Last filed 04/22/22 30 DS at  [facility] Rosuvastatin 20 mg - Last filled 04/22/22 30 DS at [facility] Lisinopril 10 mg- Last filled 04/22/22 30 DS at [facility] Pioglitazone 45 mg - Last filled 04/22/22 30 DS at [facility] Metformin 500 mg - Last filled 04/22/22 30 DS at [facility] Ozempic 2 mg - obtained through [place] Pharmacist Assistant [phone]

## 2022-05-15 ENCOUNTER — Ambulatory Visit: Payer: Self-pay | Admitting: Allergy

## 2022-05-25 ENCOUNTER — Telehealth: Payer: Self-pay | Admitting: Pharmacist

## 2022-05-25 NOTE — Chronic Care Management (AMB) (Signed)
    Chronic Care Management Pharmacy Assistant   Name: Lucas Warren  MRN: [id number] DOB: 1945/09/16  05/25/22 APPOINTMENT REMINDER  Patient was reminded to have all medications, supplements and any blood glucose and blood pressure readings available for review with [name], Pharm. D, for telephone visit on 05/26/22 at 10.    Care Gaps: COVID Booster - Overdue Diabetic Kidney Eval (Urine) - Overdue Flu Vaccine - Overdue Eye Exam - Overdue Zoster Vaccine - Postponed PNA Vaccine - Postponed BP- 112/68 05/06/22 AWV- 8/23 Lab Results  Component Value Date   HGBA1C 7.8 (A) 03/16/2022    Star Rating Drug: Jardiance 10 mg - Last filed 04/22/22 30 DS at [facility] Rosuvastatin 20 mg - Last filled 04/22/22 30 DS at [facility] Lisinopril 10 mg- Last filled 04/22/22 30 DS at [facility] Pioglitazone 45 mg - Last filled 04/22/22 30 DS at [facility] Metformin 500 mg - Last filled 04/22/22 30 DS at [facility] Ozempic 2 mg - obtained through [facility]   Medications: Outpatient Encounter Medications as of 05/25/2022  Medication Sig   aspirin 81 MG tablet Take 1 tablet by mouth at bedtime.    glimepiride (AMARYL) 4 MG tablet TAKE ONE TABLET BY MOUTH EVERY MORNING (Patient taking differently: 2 mg. TAKE ONE HALF TABLET BY MOUTH EVERY MORNING)   ipratropium (ATROVENT) 0.06 % nasal spray Place 2 sprays each nostril up to 4 times a day for symptoms relief   JARDIANCE 10 MG TABS tablet TAKE ONE TABLET BY MOUTH EVERY MORNING   Lancets (ONETOUCH DELICA PLUS [id number]) MISC USE ONE TO CHECK GLUCOSE TWICE DAILY   lisinopril (ZESTRIL) 10 MG tablet TAKE ONE TABLET BY MOUTH EVERY EVENING   metFORMIN (GLUCOPHAGE) 1000 MG tablet Take 1 tablet (1,000 mg total) by mouth 2 (two) times daily with a meal.   nitroGLYCERIN (NITROSTAT) 0.4 MG SL tablet Take one tablet sublingual as needed for chest pain up to three doses as needed.   ONETOUCH ULTRA test strip USE ONE STRIP TWICE DAILY TO CHECK BLOOD SUGARS   pioglitazone  (ACTOS) 45 MG tablet TAKE ONE TABLET BY MOUTH EVERY MORNING   rosuvastatin (CRESTOR) 20 MG tablet TAKE ONE TABLET BY MOUTH EVERYDAY AT BEDTIME   Semaglutide, 2 MG/DOSE, 8 MG/3ML SOPN Inject 2 mg as directed once a week.   tadalafil (CIALIS) 20 MG tablet TAKE ONE TABLET BY MOUTH every other DAY AS NEEDED   vitamin B-12 (CYANOCOBALAMIN) 1000 MCG tablet Take 1,000 mcg by mouth at bedtime.    No facility-administered encounter medications on file as of 05/25/2022.      Manuel Garcia Clinical Pharmacist Assistant [phone]

## 2022-05-26 ENCOUNTER — Ambulatory Visit (INDEPENDENT_AMBULATORY_CARE_PROVIDER_SITE_OTHER): Payer: Medicare Other | Admitting: Pharmacist

## 2022-05-26 DIAGNOSIS — E1165 Type 2 diabetes mellitus with hyperglycemia: Secondary | ICD-10-CM

## 2022-05-26 DIAGNOSIS — J3089 Other allergic rhinitis: Secondary | ICD-10-CM

## 2022-05-26 NOTE — Progress Notes (Signed)
Chronic Care Management Pharmacy Note  05/26/2022 Name:  Lucas Warren MRN:  [id number] DOB:  [birth year]  Summary: A1c not at goal < 7%   Recommendations/Changes made from today's visit: -Recommend alternating times of checking blood sugars 1-2 hours after eating -Recommended trial of CGM and food diary -Recommend repeat BMET and could consider increasing Jardiance depending on GFR  Plan: Follow up in office in 3 weeks for Dexcom sample  Subjective: Lucas Warren is an 76 y.o. year old male who is a primary patient of Burchette, Alinda Sierras, MD.  The CCM team was consulted for assistance with disease management and care coordination needs.    Engaged with patient by telephone for follow up visit in response to provider referral for pharmacy case management and/or care coordination services.   Consent to Services:  The patient was given information about Chronic Care Management services, agreed to services, and gave verbal consent prior to initiation of services.  Please see initial visit note for detailed documentation.   Patient Care Team: [name], MD as PCP - General (Family Medicine) Jettie Booze, MD as PCP - Cardiology (Cardiology) Viona Gilmore, [facility] as Pharmacist (Pharmacist)  Recent office visits: 04/30/22 Rolene Arbour, LPN: Patient presented for AWV.  03/16/22 Burchette, Alinda Sierras, MD - Patient presented for Poorly controlled type 2 diabetes and other concerns. Stopped Ipratropium Bromide.  12/15/21 Burchette, Alinda Sierras, MD - Patient presented for Poorly controlled type 2 diabetes and other concerns. Increased Semaglutide.   11/26/21 Martinique, Betty G, MD - Patient presented for Generalized abdominal pain and other concerns. Stopped Montelukast .  Recent consult visits: 05/06/22 Kennith Gain, MD (Allergy) - Patient presented for Chronic rhinitis and other concerns. Prescribed Ipratropium Bromide 0.06 %.   03/04/22 Jettie Booze, MD  (Cardiology) - Patient presented for CAD involving native coronary artery of native heart with other form of angina pectoris and other concerns. No medication changes.  Hospital visits: None in previous 6 months  Objective:  Lab Results  Component Value Date   CREATININE 1.61 (H) 11/26/2021   BUN 22 11/26/2021   GFR 42.05 (L) 11/26/2021   GFRNONAA 42 (L) 06/11/2020   GFRAA 48 (L) 06/11/2020   NA 139 11/26/2021   K 4.0 11/26/2021   CALCIUM 10.0 11/26/2021   CO2 22 11/26/2021   GLUCOSE 241 (H) 11/26/2021    Lab Results  Component Value Date/Time   HGBA1C 7.8 (A) 03/16/2022 08:22 AM   HGBA1C 8.1 (A) 12/15/2021 08:31 AM   HGBA1C 8.3 (H) 12/25/2015 08:39 AM   HGBA1C 8.4 10/14/2015 12:00 AM   HGBA1C 7.3 (H) 05/22/2015 09:53 AM   GFR 42.05 (L) 11/26/2021 11:14 AM   GFR 46.97 (L) 09/15/2021 08:57 AM   MICROALBUR 2.5 06/11/2020 08:23 AM   MICROALBUR 4.3 (H) 01/10/2018 08:54 AM    Last diabetic Eye exam:  Lab Results  Component Value Date/Time   HMDIABEYEEXA No Retinopathy 04/02/2020 12:00 AM    Last diabetic Foot exam:  Lab Results  Component Value Date/Time   HMDIABFOOTEX normal 03/25/2016 12:00 AM     Lab Results  Component Value Date   CHOL 109 09/15/2021   HDL 50.50 09/15/2021   LDLCALC 37 09/15/2021   TRIG 109.0 09/15/2021   CHOLHDL 2 09/15/2021       Latest Ref Rng & Units 11/26/2021   11:14 AM 09/15/2021    8:57 AM 06/11/2020    8:31 AM  Hepatic Function  Total Protein 6.0 -  8.3 g/dL 7.2  7.1  6.7   Albumin 3.5 - 5.2 g/dL 4.5  4.3    AST 0 - 37 U/L _0 ALT 0 - 53 U/L _1 Alk Phosphatase 39 - 117 U/L 44  39    Total Bilirubin 0.2 - 1.2 mg/dL 0.4  0.4  0.4   Bilirubin, Direct 0.0 - 0.3 mg/dL  0.1      Lab Results  Component Value Date/Time   TSH 0.71 11/26/2021 11:14 AM   TSH 0.86 07/24/2015 12:35 PM       Latest Ref Rng & Units 11/26/2021   11:14 AM 05/15/2020   10:45 AM 11/11/2018   12:56 PM  CBC  WBC 4.0 - 10.5 K/uL 7.4  8.3   9.1   Hemoglobin 13.0 - 17.0 g/dL 11.8  12.9  13.9   Hematocrit 39.0 - 52.0 % 35.7  38.6  42.0   Platelets 150.0 - 400.0 K/uL 218.0  227  222.0     No results found for: "VD25OH"  Clinical ASCVD: No  The ASCVD Risk score (Arnett DK, et al., 2019) failed to calculate for the following reasons:   The valid total cholesterol range is 130 to 320 mg/dL       04/30/2022    9:29 AM 11/26/2021   10:39 AM 06/13/2021    8:37 AM  Depression screen PHQ 2/9  Decreased Interest 0 2 0  Down, Depressed, Hopeless 0 0 0  PHQ - 2 Score 0 2 0  Altered sleeping  0   Tired, decreased energy  1   Change in appetite  1   Feeling bad or failure about yourself   0   Trouble concentrating  0   Moving slowly or fidgety/restless  0   Suicidal thoughts  0   PHQ-9 Score  4   Difficult doing work/chores  Not difficult at all       Social History   Tobacco Use  Smoking Status Former   Packs/day: 2.00   Years: 20.00   Total pack years: 40.00   Types: Cigarettes   Quit date: 08/31/1981   Years since quitting: 40.7  Smokeless Tobacco Never   BP Readings from Last 3 Encounters:  05/06/22 112/68  03/16/22 126/66  03/04/22 102/60   Pulse Readings from Last 3 Encounters:  05/06/22 74  03/16/22 65  03/04/22 71   Wt Readings from Last 3 Encounters:  05/06/22 204 lb 12.8 oz (92.9 kg)  04/30/22 197 lb (89.4 kg)  03/16/22 201 lb 8 oz (91.4 kg)   BMI Readings from Last 3 Encounters:  05/06/22 28.56 kg/m  04/30/22 26.72 kg/m  03/16/22 27.33 kg/m    Assessment/Interventions: Review of patient past medical history, allergies, medications, health status, including review of consultants reports, laboratory and other test data, was performed as part of comprehensive evaluation and provision of chronic care management services.   SDOH:  (Social Determinants of Health) assessments and interventions performed: Yes  SDOH Interventions    Flowsheet Row Chronic Care Management from 05/26/2022 in [place] at [place] from 04/30/2022 in [place] at [place] from 04/29/2021 in [place] at [place] Management from 05/07/2020 in [place] [facility] [place] from 04/26/2020 in [place] at [place] Management from 03/18/2020 in [place] at [place] Interventions -- Intervention  Not Indicated Intervention Not Indicated -- Intervention Not Indicated --  Housing Interventions -- Intervention Not Indicated Intervention Not Indicated -- Intervention Not Indicated --  Transportation Interventions -- Intervention Not Indicated Intervention Not Indicated Intervention Not Indicated Intervention Not Indicated --  Financial Strain Interventions _0  Other (Comment)  [Patient assistance forms mailed and faxed for Januvia and Jardiance]  Physical Activity Interventions -- Intervention Not Indicated Intervention Not Indicated -- Intervention Not Indicated --  Stress Interventions -- Intervention Not Indicated Intervention Not Indicated -- Intervention Not Indicated --  Social Connections Interventions -- Intervention Not Indicated Intervention Not Indicated -- Intervention Not Indicated --       CCM Care Plan  Allergies  Allergen Reactions   Lisinopril Cough   Erythromycin     REACTION: abd pain   Trulicity [Dulaglutide] Nausea Only    Medications Reviewed Today     Reviewed by Viona Gilmore, [facility] (Pharmacist) on 05/26/22 at 1010  Med List Status: <None>   Medication Order Taking? Sig Documenting Provider Last Dose Status Informant  aspirin 81 MG tablet [id number]  Take 1 tablet by mouth at bedtime.  [provider]  Active Self  glimepiride (AMARYL) 4 MG tablet [id number]  TAKE ONE TABLET BY  MOUTH EVERY MORNING  Patient taking differently: 2 mg. TAKE ONE HALF TABLET BY MOUTH EVERY MORNING   Burchette, Alinda Sierras, MD  Active   ipratropium (ATROVENT) 0.06 % nasal spray [id number] No Place 2 sprays each nostril up to 4 times a day for symptoms relief  Patient not taking: Reported on 05/26/2022   Kennith Gain, MD Not Taking Active   JARDIANCE 10 MG TABS tablet [id number]  TAKE ONE TABLET BY MOUTH EVERY MORNING Burchette, Alinda Sierras, MD  Active   Lancets (ONETOUCH DELICA PLUS [id number]) Gantt [id number]  USE ONE TO CHECK GLUCOSE TWICE DAILY [name], MD  Active   lisinopril (ZESTRIL) 10 MG tablet [id number]  TAKE ONE TABLET BY MOUTH EVERY EVENING Burchette, Alinda Sierras, MD  Active   metFORMIN (GLUCOPHAGE) 1000 MG tablet [id number]  Take 1 tablet (1,000 mg total) by mouth 2 (two) times daily with a meal. Burchette, Alinda Sierras, MD  Active   nitroGLYCERIN (NITROSTAT) 0.4 MG SL tablet [id number]  Take one tablet sublingual as needed for chest pain up to three doses as needed. [name], MD  Active   [facility] ULTRA test strip [id number]  USE ONE STRIP TWICE DAILY TO CHECK BLOOD SUGARS [name], MD  Active   pioglitazone (ACTOS) 45 MG tablet [id number]  TAKE ONE TABLET BY MOUTH EVERY MORNING Burchette, Alinda Sierras, MD  Active   rosuvastatin (CRESTOR) 20 MG tablet [id number]  TAKE ONE TABLET BY MOUTH EVERYDAY AT BEDTIME Jettie Booze, MD  Active   Semaglutide, 2 MG/DOSE, 8 MG/3ML SOPN [id number] Yes Inject 2 mg as directed once a week. [name], MD Taking Active   tadalafil (CIALIS) 20 MG tablet [id number]  TAKE ONE TABLET BY MOUTH every other DAY AS NEEDED Burchette, Alinda Sierras, MD  Active   vitamin B-12 (CYANOCOBALAMIN) 1000 MCG tablet [id number]  Take 1,000 mcg by mouth at bedtime.  [provider]  Active Self            Patient Active Problem List   Diagnosis Date Noted   Erectile dysfunction 03/11/2020   CKD (chronic kidney disease) stage 3, GFR  30-59 ml/min ([place]) 11/06/2019  Laryngopharyngeal reflux (LPR) 06/12/2019   Perennial allergic rhinitis 06/12/2019   Chronic right shoulder pain 09/24/2016   Cough 08/19/2016   Hemoptysis 08/19/2016   Normocytic anemia 06/29/2016   Hypertension 12/25/2015   Obesity (BMI 30-39.9) 02/02/2014   Acute URI 10/10/2012   Overweight 04/05/2012   Venous insufficiency 04/02/2011   HEADACHE 02/25/2008   CHEST PAIN, ATYPICAL 02/25/2008   HYPERCHOLESTEROLEMIA 02/24/2008   Poorly controlled type 2 diabetes mellitus ([place]) 02/23/2008   ANXIETY 02/23/2008   GERD 02/23/2008   PSORIASIS 02/23/2008   DEGENERATIVE JOINT DISEASE 02/23/2008   COLONIC POLYPS, HX OF 02/23/2008    Immunization History  Administered Date(s) Administered   Fluad Quad(high Dose 65+) 05/09/2019, 06/11/2020   Influenza Split 10/05/2011   Influenza Whole 07/16/2009   Influenza, High Dose Seasonal PF 05/22/2015, 06/29/2016, 06/15/2017, 05/20/2018   Influenza,inj,Quad PF,6+ Mos 07/07/2013, 05/04/2014   PFIZER(Purple Top)SARS-COV-2 Vaccination 10/22/2019, 11/15/2019, 07/09/2020   Pneumococcal Conjugate-13 08/21/2014   Pneumococcal Polysaccharide-23 12/05/2012   Pneumococcal-Unspecified 10/11/2007   Td 03/09/2006   Tdap 03/25/2016   Zoster, Live 03/31/2016   Patient reports he hasn't had any lows with the glimepiride and taking 1/2 tablet per day. He hasn't had any low blood sugars with taking it this way but this was changed around the same time as the Ozempic dose was increased. Patient has been taking Ozempic 2 mg and hasn't seen much of a difference in his blood sugars. He still has lost more weight though and is down to 195 lbs.  His nose is still running when he eats and when he gets up first thing in the morning. The nasal spray did not help much. He reports this has been going on for several years and he is very annoyed with it and has to carry a handkerchief with him because of how bad it is. He has never had any  allergies to amount to anything. Patient has been taking Zyrtec but it wasn't working as well so he stopped it. The Flonase made his nose bleed and the Nasacort is better with the nosebleeds.  Patient doesn't snack late at night. He tries not to eat after 7-8pm and then goes to bed between 10-12. He usually eats supper around 6-7pm. He bought a watch that will give him an A1c but isn't sure it's accurate. The A1c is reading under 7 by the watch. He does snack in the mornings and eats PB crackers and cream cheese and chive crackers.   Conditions to be addressed/monitored:  Hypertension, Hyperlipidemia, Diabetes, Chronic Kidney Disease and ED  Conditions addressed this visit: Diabetes, allergic rhinitis  Care Plan : CCM Pharmacy Care Plan  Updates made by Viona Gilmore, [place] since 05/26/2022 12:00 AM     Problem: Problem: Hypertension, Hyperlipidemia, Diabetes, Chronic Kidney Disease and ED      Long-Range Goal: Patient-Specific Goal   Start Date: 11/20/2020  Expected End Date: 11/20/2021  Recent Progress: On track  Priority: High  Note:   Current Barriers:  Unable to independently monitor therapeutic efficacy Unable to achieve control of diabetes   Pharmacist Clinical Goal(s):  Patient will achieve adherence to monitoring guidelines and medication adherence to achieve therapeutic efficacy achieve control of diabetes as evidenced by A1c  through collaboration with PharmD and provider.   Interventions: 1:1 collaboration with [name], MD regarding development and update of comprehensive plan of care as evidenced by provider attestation and co-signature Inter-disciplinary care team collaboration (see longitudinal plan of care) Comprehensive medication review performed; medication  list updated in electronic medical record  Hypertension (BP goal <140/90) -Controlled -Current treatment: Lisinopril 2m, 1 tablet once daily - Appropriate, Effective, Safe,  Accessible -Medications previously tried: isosorbide  -Current home readings: not checking regularly -Current dietary habits: did not discuss  -Current exercise habits: did not discuss -Denies hypotensive/hypertensive symptoms -Educated on Exercise goal of 150 minutes per week; Importance of home blood pressure monitoring; -Counseled to monitor BP at home weekly, document, and provide log at future appointments -Counseled on diet and exercise extensively Recommended to continue current medication  Hyperlipidemia: (LDL goal < 55) -Controlled -Current treatment: Rosuvastatin 275m 1 tablet once daily - Appropriate, Effective, Safe, Accessible -Medications previously tried:  rosuvastatin (elevated BGs), simvastatin (myalgias)  -Current dietary patterns: did not discuss -Current exercise habits: did not discuss -Educated on Cholesterol goals;  Importance of limiting foods high in cholesterol; Exercise goal of 150 minutes per week; -Recommended to continue current medication  Diabetes (A1c goal <7%) -Uncontrolled -Current medications: Glimepiride 80m37m1/2 tablet once daily with breakfast - Appropriate, Query effective, Safe, Accessible Empagliflozin (Jardiance) 58m35m tablet once daily - Appropriate, Query effective, Safe, Accessible Metformin 500mg6mtablets twice daily - Appropriate, Query effective, Safe, Accessible Pioglitazone (Actos) 45mg,280mablet once daily -  Appropriate, Query effective, Safe, Accessible Ozempic, inject 2 mg once weekly - Appropriate, Query effective, Safe, Accessible -Medications previously tried: Onglyza (cost), Januvia (switch to GLP1), Trulicity (nausea)  -Current home glucose readings fasting glucose: 80, 106, 141, 89, 70, 112, 88, 129, 90, 101, 88, 87, 76, 85, 95, 133, 95 (80-120 in the morning) post prandial glucose: not checking -Denies hypoglycemic/hyperglycemic symptoms -Current meal patterns:  breakfast: jimmy dean sausage biscuits x 2,  blueberry muffin lunch: sandwich, beans, franks  dinner: cheeseburger; apple pie; frozen stuff peppers; [place] mac n'cheese and apples snacks: ice cream sandwiches, klondike bar drinks: n/a -Current exercise: little walking; plans to do more with cooler weather -Educated on Exercise goal of 150 minutes per week; Benefits of routine self-monitoring of blood sugar; Carbohydrate counting and/or plate method -Counseled to check feet daily and get yearly eye exams -Counseled on diet and exercise extensively Recommended to continue current medication Recommended checking blood sugars 1-2 hours after meals.  CKD (Goal: improve kidney function) -Controlled -Current treatment  Jardiance 10 mg 1 tablet daily - Appropriate, Effective, Safe, Accessible -Medications previously tried: none  -Recommended to continue current medication  CAD (Goal: prevent heart attacks and strokes) -Controlled -Current treatment  Aspirin 81 mg 1 tablet daily - Appropriate, Effective, Safe, Accessible -Medications previously tried: none  -Counseled on monitoring for bleeding and bruising  ED (Goal: minimize symptoms) -Controlled -Current treatment  Cialis 5 mg 1 tablet as needed - Appropriate, Effective, Safe, Accessible -Medications previously tried: sildenafil (no longer effective)  -Recommended to continue current medication   Health Maintenance -Vaccine gaps: shingrix, COVID booster, influenza -Current therapy:  Vitamin B12 1000 mcg 1 tablet daily -Educated on Cost vs benefit of each product must be carefully weighed by individual consumer -Patient is satisfied with current therapy and denies issues -Recommended to continue current medication  Patient Goals/Self-Care Activities Patient will:  - take medications as prescribed check glucose daily, document, and provide at future appointments check blood pressure weekly, document, and provide at future appointments target a minimum of 150 minutes  of moderate intensity exercise weekly engage in dietary modifications by eating more non-starchy vegetables than carbs  Follow Up Plan: Face to Face appointment with care management team member scheduled for: 3 weeks  Medication Assistance:  Ozempic obtained through [employer] medication assistance program.  Enrollment ends 08/30/22    Compliance/Adherence/Medication fill history: Care Gaps: Shingrix, COVID booster, influenza, Prevnar 20, eye exam, UACR  BP: 112/68 (05/06/22) A1c: 7.8% (03/16/22)  Star-Rating Drugs: Jardiance 10 mg - Last filed 05/19/22 30 DS at [facility] Rosuvastatin 20 mg - Last filled 05/19/22 30 DS at [facility] Lisinopril 10 mg- Last filled 05/19/22 30 DS at [facility] Pioglitazone 45 mg - Last filled 05/19/22 30 DS at [facility] Metformin 500 mg - Last filled 05/19/22 30 DS at [facility] Ozempic 2 mg - obtained through PAP  Patient's preferred pharmacy is:  [facility] - [place], [place] - [address] [place] [address] Phone: [phone] Fax: [phone]   Uses pill box? No - adherence packaging Pt endorses 100% compliance  We discussed: Benefits of medication synchronization, packaging and delivery as well as enhanced pharmacist oversight with [facility]. Patient decided to: Utilize [facility] pharmacy for medication synchronization, packaging and delivery  Care Plan and Follow Up Patient Decision:  Patient agrees to Care Plan and Follow-up.  Plan: Face to Face appointment with care management team member scheduled for: 3 weeks  [name], PharmD [facility] Clinical Pharmacist [place] [facility] [place] [phone]

## 2022-05-26 NOTE — Patient Instructions (Signed)
Hi [place]i,  It was great to catch up again! I hope all goes well with your allergy appointment.  Please reach out to me if you have any questions or need anything before our follow up!  Best, Maddie  [name], PharmD, [place] Pharmacist [place] [facility] [place]

## 2022-05-28 ENCOUNTER — Ambulatory Visit: Payer: Medicare Other | Admitting: Family Medicine

## 2022-05-29 ENCOUNTER — Ambulatory Visit: Payer: Medicare Other | Admitting: Internal Medicine

## 2022-05-29 DIAGNOSIS — J3089 Other allergic rhinitis: Secondary | ICD-10-CM | POA: Diagnosis not present

## 2022-05-29 MED ORDER — LEVOCETIRIZINE DIHYDROCHLORIDE 5 MG PO TABS
5.0000 mg | ORAL_TABLET | Freq: Every evening | ORAL | 1 refills | Status: DC
Start: 2022-05-29 — End: 2022-12-08

## 2022-05-29 NOTE — Progress Notes (Signed)
Date of Service/Encounter:  05/29/22  Allergy testing appointment   Initial visit on 05/06/22, seen for chronic rhinitis.  Please see that note for additional details.  Today reports for allergy diagnostic testing:    DIAGNOSTICS:  Skin Testing: Environmental allergy panel and select foods. allergy testing today was epicutaneous testing was positive to grass pollen, tree pollen, dust mite intradermal testing was positive to mold mix 2 and dust mite   Adequate positive and negative controls  Results discussed with patient/family.  Airborne Adult Perc - 05/29/22 1522     Time Antigen Placed 1445    Allergen Manufacturer Lavella Hammock    Location Back    Number of Test 59    Panel 1 Select    1. Control-Buffer 50% Glycerol Negative    2. Control-Histamine 1 mg/ml 4+    3. Albumin saline Negative    4. Wilson 3+    5. [place] Negative    6. Johnson Negative    7. Holt Blue Negative    8. Meadow Fescue Negative    9. Perennial Rye Negative    10. Sweet Vernal Negative    11. Timothy Negative    12. Cocklebur Negative    13. Burweed Marshelder Negative    14. Ragweed, short Negative    15. Ragweed, Giant Negative    16. Plantain,  English Negative    17. Lamb's Quarters Negative    18. Sheep Sorrell Negative    19. Rough Pigweed Negative    20. Marsh Elder, Rough Negative    21. Mugwort, Common Negative    22. Ash mix Negative    23. Birch mix Negative    24. Beech American Negative    25. Box, Elder Negative    26. Cedar, red Negative    27. Cottonwood, [place] Negative    28. Elm mix 3+    29. Hickory Negative    30. Maple mix Negative    31. Oak, [place] mix Negative    32. Pecan Pollen Negative    33. Pine mix Negative    35. [place] Oaks, Black Pollen Negative    36. Alternaria alternata Negative    37. Cladosporium Herbarum Negative    38. Aspergillus mix Negative    39. Penicillium mix Negative    40. Bipolaris sorokiniana (Helminthosporium) Negative    41.  Drechslera spicifera (Curvularia) Negative    42. Mucor plumbeus Negative    43. Fusarium moniliforme Negative    44. Aureobasidium pullulans (pullulara) Negative    45. Rhizopus oryzae Negative    46. Botrytis cinera Negative    47. Epicoccum nigrum Negative    48. Phoma betae Negative    49. Candida Albicans Negative    50. Trichophyton mentagrophytes Negative    51. Mite, D Farinae  5,000 AU/ml 4+    52. Mite, D Pteronyssinus  5,000 AU/ml 4+    53. Cat Hair 10,000 BAU/ml Negative    54.  Dog Epithelia Negative    55. Mixed Feathers Negative    56. Horse Epithelia Negative    57. Cockroach, German Negative    58. Mouse Negative    59. Tobacco Leaf Negative             Food Perc - 05/29/22 1500       Test Information   Time Antigen Placed 4782    Allergen Manufacturer Lavella Hammock    Location Back    Number of allergen test 10      Food  1. Peanut Negative    2. Soybean food Negative    3. Wheat, whole Negative    4. Sesame Negative    5. Milk, cow Negative    6. Egg White, chicken Negative    7. Casein Negative    8. Shellfish mix Negative    9. Fish mix Negative    10. Cashew Negative             Intradermal - 05/29/22 1524     Time Antigen Placed 1500    Allergen Manufacturer Lavella Hammock    Location Arm    Number of Test 12    Intradermal Select    Control Negative    Gu[place] Negative    Johnson Negative    Ragweed mix Negative    Weed mix Negative    Mold 1 Negative    Mold 2 3+    Mold 3 Negative    Mold 4 Negative    Cat Negative    Dog Negative    Mite mix 4+             Allergy testing results were read and interpreted by myself, documented by clinical staff.  Allergic and vasomotor Rhinitis not well controlled : - allergy testing today was epicutaneous testing was positive to grass pollen, tree pollen, dust mite intradermal testing was positive to mold mix 2 and dust mite  - allergen avoidance as below - consider allergy shots as long  term control of your symptoms by teaching your immune system to be more tolerant of your allergy triggers - Consider Nasal Steroid Spray: Options include Flonase (fluticasone), Nasocort (triamcinolone), Nasonex (mometasome) 1- 2 sprays in each nostril daily (can buy over-the-counter if not covered by insurance)  Best results if used daily. - Continue Atrovent (Ipratropium Bromide) 1-2 sprays in each nostril up to 3 times a day as needed for runny nose/post nasal drip/drainage.  Use less frequently if airway gets too dry. - Start Xyzal '5mg'$  (Levocetirizine) daily   Follow up: with Dr. Nelva Bush in 4-6 weeks or sooner if you want to start allergy injections   Thank you so much for letting me partake in your care today.  Don't hesitate to reach out if you have any additional concerns!  Roney Marion, MD  Allergy and [place], [place]

## 2022-05-29 NOTE — Patient Instructions (Signed)
Allergic and vasomotor Rhinitis not well controlled : - allergy testing today was epicutaneous testing was positive to grass pollen, tree pollen, dust mite intradermal testing was positive to mold mix 2 and dust mite  - allergen avoidance as below - consider allergy shots as long term control of your symptoms by teaching your immune system to be more tolerant of your allergy triggers - Consider Nasal Steroid Spray: Options include Flonase (fluticasone), Nasocort (triamcinolone), Nasonex (mometasome) 1- 2 sprays in each nostril daily (can buy over-the-counter if not covered by insurance)  Best results if used daily. - Continue Atrovent (Ipratropium Bromide) 1-2 sprays in each nostril up to 3 times a day as needed for runny nose/post nasal drip/drainage.  Use less frequently if airway gets too dry. - Start Xyzal '5mg'$  (Levocetirizine) daily   Follow up: with Dr. Nelva Bush in 4-6 weeks or sooner if you want to start allergy injections   Thank you so much for letting me partake in your care today.  Don't hesitate to reach out if you have any additional concerns!  Roney Marion, MD  [facility] [place], [place]  Reducing Pollen Exposure  The American Academy of Allergy, Asthma and Immunology suggests the following steps to reduce your exposure to pollen during allergy seasons.    Do not hang sheets or clothing out to dry; pollen may collect on these items. Do not mow lawns or spend time around freshly cut grass; mowing stirs up pollen. Keep windows closed at night.  Keep car windows closed while driving. Minimize morning activities outdoors, a time when pollen counts are usually at their highest. Stay indoors as much as possible when pollen counts or humidity is high and on windy days when pollen tends to remain in the air longer. Use air conditioning when possible.  Many air conditioners have filters that trap the pollen spores. Use a HEPA room air filter to remove pollen form the  indoor air you breathe.  DUST MITE AVOIDANCE MEASURES:  There are three main measures that need and can be taken to avoid house dust mites:  Reduce accumulation of dust in general -reduce furniture, clothing, carpeting, books, stuffed animals, especially in bedroom  Separate yourself from the dust -use pillow and mattress encasements (can be found at stores such as [employer] or online) -avoid direct exposure to air condition flow -use a HEPA filter device, especially in the bedroom; you can also use a HEPA filter vacuum cleaner -wipe dust with a moist towel instead of a dry towel or broom when cleaning  Decrease mites and/or their secretions -wash clothing and linen and stuffed animals at highest temperature possible, at least every 2 weeks -stuffed animals can also be placed in a bag and put in a freezer overnight  Despite the above measures, it is impossible to eliminate dust mites or their allergen completely from your home.  With the above measures the burden of mites in your home can be diminished, with the goal of minimizing your allergic symptoms.  Success will be reached only when implementing and using all means together.  Control of Mold Allergen   Mold and fungi can grow on a variety of surfaces provided certain temperature and moisture conditions exist.  Outdoor molds grow on plants, decaying vegetation and soil.  The major outdoor mold, Alternaria and Cladosporium, are found in very high numbers during hot and dry conditions.  Generally, a late Summer - Fall peak is seen for common outdoor fungal spores.  Rain will temporarily lower outdoor mold spore count, but counts rise rapidly when the rainy period ends.  The most important indoor molds are Aspergillus and Penicillium.  Dark, humid and poorly ventilated basements are ideal sites for mold growth.  The next most common sites of mold growth are the bathroom and the kitchen.   Indoor (Perennial) Mold Control    Positive indoor molds via skin testing: Aspergillus and Penicillium  Maintain humidity below 50%. Clean washable surfaces with 5% bleach solution. Remove sources e.g. contaminated carpets.

## 2022-05-30 DIAGNOSIS — Z7984 Long term (current) use of oral hypoglycemic drugs: Secondary | ICD-10-CM | POA: Diagnosis not present

## 2022-05-30 DIAGNOSIS — I2510 Atherosclerotic heart disease of native coronary artery without angina pectoris: Secondary | ICD-10-CM

## 2022-05-30 DIAGNOSIS — E785 Hyperlipidemia, unspecified: Secondary | ICD-10-CM | POA: Diagnosis not present

## 2022-05-30 DIAGNOSIS — N189 Chronic kidney disease, unspecified: Secondary | ICD-10-CM | POA: Diagnosis not present

## 2022-05-30 DIAGNOSIS — Z7985 Long-term (current) use of injectable non-insulin antidiabetic drugs: Secondary | ICD-10-CM

## 2022-05-30 DIAGNOSIS — E1122 Type 2 diabetes mellitus with diabetic chronic kidney disease: Secondary | ICD-10-CM | POA: Diagnosis not present

## 2022-05-30 DIAGNOSIS — I1310 Hypertensive heart and chronic kidney disease without heart failure, with stage 1 through stage 4 chronic kidney disease, or unspecified chronic kidney disease: Secondary | ICD-10-CM

## 2022-06-11 ENCOUNTER — Other Ambulatory Visit: Payer: Self-pay | Admitting: Family Medicine

## 2022-06-12 ENCOUNTER — Encounter: Payer: Self-pay | Admitting: Family Medicine

## 2022-06-12 ENCOUNTER — Telehealth: Payer: Self-pay | Admitting: Pharmacist

## 2022-06-12 ENCOUNTER — Telehealth (INDEPENDENT_AMBULATORY_CARE_PROVIDER_SITE_OTHER): Payer: Medicare Other | Admitting: Family Medicine

## 2022-06-12 ENCOUNTER — Other Ambulatory Visit: Payer: Self-pay | Admitting: Family Medicine

## 2022-06-12 VITALS — BP 126/84 | HR 74 | Temp 97.2°F | Ht 71.0 in | Wt 195.0 lb

## 2022-06-12 DIAGNOSIS — U071 COVID-19: Secondary | ICD-10-CM

## 2022-06-12 MED ORDER — MOLNUPIRAVIR EUA 200MG CAPSULE: 4.0000 | ORAL_CAPSULE | Freq: Two times a day (BID) | ORAL | 0 refills | Status: AC

## 2022-06-12 MED ORDER — MOLNUPIRAVIR EUA 200MG CAPSULE: 4.0000 | ORAL_CAPSULE | Freq: Two times a day (BID) | ORAL | 0 refills | Status: DC

## 2022-06-12 NOTE — Progress Notes (Signed)
Patient ID: Lucas Warren, male   DOB: [birth year], 76 y.o.   MRN: [id number]   Virtual Visit via Video Note  I connected with Lucas Warren on 06/12/22 at  3:00 PM EDT by a video enabled telemedicine application and verified that I am speaking with the correct person using two identifiers.  Location patient: home Location provider:work or home office Persons participating in the virtual visit: patient, provider  I discussed the limitations of evaluation and management by telemedicine and the availability of in person appointments. The patient expressed understanding and agreed to proceed.   HPI:  Lucas Warren has COVID by home COVID test couple days ago.  He states he was around some friends last weekend and was called earlier in the week that couple of them had tested positive.  He first developed symptoms of sore throat on Tuesday.  He had home COVID test then that came back negative.  He then tested again on Wednesday and came back positive.  He had some sore throat and cough and mild nasal congestion.  No fever.  States that his symptoms are relatively mild.  Denies any nausea, vomiting, or diarrhea.  Has had COVID previously about a year ago and was treated with molnupiravir.  He would like to consider antiviral therapy.  He is 76 years old has type 2 diabetes along with hyperlipidemia and hypertension.  ROS: See pertinent positives and negatives per HPI.  Past Medical History:  Diagnosis Date   Allergy    seasonal   Anxiety    Chest pain, atypical    Colon polyps    hyperplastic 2003   DJD (degenerative joint disease)    DM (diabetes mellitus) ([place])    GERD (gastroesophageal reflux disease)    History of headache    Hypercholesteremia    Psoriasis     Past Surgical History:  Procedure Laterality Date   CATARACT EXTRACTION, BILATERAL Bilateral    Dr. Tommy Rainwater;    [place] CATH AND CORONARY ANGIOGRAPHY N/A 05/20/2020   Procedure: LEFT HEART CATH AND  CORONARY ANGIOGRAPHY;  Surgeon: Jettie Booze, MD;  Location: [facility];  Service: Cardiovascular;  Laterality: N/A;   NASAL SEPTUM SURGERY  1989   SINOSCOPY     TONSILLECTOMY      Family History  Problem Relation Age of Onset   Lung cancer Mother    Alzheimer's disease Father    Heart disease Father    Leukemia Sister    Allergic rhinitis Neg Hx    Asthma Neg Hx    Eczema Neg Hx    Urticaria Neg Hx     SOCIAL HX:   Non-smoker   Current Outpatient Medications:    aspirin 81 MG tablet, Take 1 tablet by mouth at bedtime. , Disp: , Rfl:    glimepiride (AMARYL) 4 MG tablet, TAKE ONE TABLET BY MOUTH EVERY MORNING, Disp: 90 tablet, Rfl: 0   ipratropium (ATROVENT) 0.06 % nasal spray, Place 2 sprays each nostril up to 4 times a day for symptoms relief, Disp: 15 mL, Rfl: 5   JARDIANCE 10 MG TABS tablet, TAKE ONE TABLET BY MOUTH EVERY MORNING, Disp: 90 tablet, Rfl: 3   Lancets (ONETOUCH DELICA PLUS [id number]) MISC, USE ONE TO CHECK GLUCOSE TWICE DAILY, Disp: 180 each, Rfl: 3   levocetirizine (XYZAL) 5 MG tablet, Take 1 tablet (5 mg total) by mouth every evening., Disp: 90 tablet, Rfl: 1   lisinopril (ZESTRIL) 10 MG  tablet, TAKE ONE TABLET BY MOUTH EVERY EVENING, Disp: 90 tablet, Rfl: 3   metFORMIN (GLUCOPHAGE) 1000 MG tablet, Take 1 tablet (1,000 mg total) by mouth 2 (two) times daily with a meal., Disp: 180 tablet, Rfl: 1   molnupiravir EUA (LAGEVRIO) 200 mg CAPS capsule, Take 4 capsules (800 mg total) by mouth 2 (two) times daily for 5 days., Disp: 40 capsule, Rfl: 0   nitroGLYCERIN (NITROSTAT) 0.4 MG SL tablet, Take one tablet sublingual as needed for chest pain up to three doses as needed., Disp: 20 tablet, Rfl: 0   ONETOUCH ULTRA test strip, USE ONE STRIP TWICE DAILY TO CHECK BLOOD SUGARS, Disp: 180 strip, Rfl: 3   pioglitazone (ACTOS) 45 MG tablet, TAKE ONE TABLET BY MOUTH EVERY MORNING, Disp: 90 tablet, Rfl: 3   rosuvastatin (CRESTOR) 20 MG tablet, TAKE ONE TABLET BY  MOUTH EVERYDAY AT BEDTIME, Disp: 90 tablet, Rfl: 3   Semaglutide, 2 MG/DOSE, 8 MG/3ML SOPN, Inject 2 mg as directed once a week., Disp: 3 mL, Rfl: 11   tadalafil (CIALIS) 20 MG tablet, TAKE ONE TABLET BY MOUTH every other DAY AS NEEDED, Disp: 30 tablet, Rfl: 3   vitamin B-12 (CYANOCOBALAMIN) 1000 MCG tablet, Take 1,000 mcg by mouth at bedtime. , Disp: , Rfl:   EXAM:  VITALS per patient if applicable:  GENERAL: alert, oriented, appears well and in no acute distress  HEENT: atraumatic, conjunttiva clear, no obvious abnormalities on inspection of external nose and ears  NECK: normal movements of the head and neck  LUNGS: on inspection no signs of respiratory distress, breathing rate appears normal, no obvious gross SOB, gasping or wheezing  CV: no obvious cyanosis  MS: moves all visible extremities without noticeable abnormality  PSYCH/NEURO: pleasant and cooperative, no obvious depression or anxiety, speech and thought processing grossly intact  ASSESSMENT AND PLAN:  Discussed the following assessment and plan:  COVID.   Day 3 of symptoms.  Patient is doing relatively well with relatively mild symptoms but he would like to consider starting antiviral therapy.  Given his age and comorbidities we will go ahead and send in molnupiravir 4 capsules by mouth twice daily for 5 days Plenty of fluids and rest. Isolation for 5 days and then mask use for additional 5 Follow-up for any persistent or worsening symptoms     I discussed the assessment and treatment plan with the patient. The patient was provided an opportunity to ask questions and all were answered. The patient agreed with the plan and demonstrated an understanding of the instructions.   The patient was advised to call back or seek an in-person evaluation if the symptoms worsen or if the condition fails to improve as anticipated.     [name], MD

## 2022-06-12 NOTE — Chronic Care Management (AMB) (Signed)
Chronic Care Management Pharmacy Assistant   Name: Lucas Warren  MRN: [id number] DOB: 1946/04/10  Reason for Encounter: Medication Review / Medication Coordination Call   Recent office visits:  None  Recent consult visits:  05/29/2022 Roney Marion MD (allergy) - Patient was seen for other allergic rhinitis. Started Xyzal 5 mg. Follow up with Dr. Nelva Bush in 4-6 weeks or sooner if you want to start allergy injections.  Hospital visits:  None  Medications: Outpatient Encounter Medications as of 06/12/2022  Medication Sig   aspirin 81 MG tablet Take 1 tablet by mouth at bedtime.    glimepiride (AMARYL) 4 MG tablet TAKE ONE TABLET BY MOUTH EVERY MORNING   ipratropium (ATROVENT) 0.06 % nasal spray Place 2 sprays each nostril up to 4 times a day for symptoms relief (Patient not taking: Reported on 05/26/2022)   JARDIANCE 10 MG TABS tablet TAKE ONE TABLET BY MOUTH EVERY MORNING   Lancets (ONETOUCH DELICA PLUS [id number]) MISC USE ONE TO CHECK GLUCOSE TWICE DAILY   levocetirizine (XYZAL) 5 MG tablet Take 1 tablet (5 mg total) by mouth every evening.   lisinopril (ZESTRIL) 10 MG tablet TAKE ONE TABLET BY MOUTH EVERY EVENING   metFORMIN (GLUCOPHAGE) 1000 MG tablet Take 1 tablet (1,000 mg total) by mouth 2 (two) times daily with a meal.   nitroGLYCERIN (NITROSTAT) 0.4 MG SL tablet Take one tablet sublingual as needed for chest pain up to three doses as needed.   ONETOUCH ULTRA test strip USE ONE STRIP TWICE DAILY TO CHECK BLOOD SUGARS   pioglitazone (ACTOS) 45 MG tablet TAKE ONE TABLET BY MOUTH EVERY MORNING   rosuvastatin (CRESTOR) 20 MG tablet TAKE ONE TABLET BY MOUTH EVERYDAY AT BEDTIME   Semaglutide, 2 MG/DOSE, 8 MG/3ML SOPN Inject 2 mg as directed once a week.   tadalafil (CIALIS) 20 MG tablet TAKE ONE TABLET BY MOUTH every other DAY AS NEEDED   vitamin B-12 (CYANOCOBALAMIN) 1000 MCG tablet Take 1,000 mcg by mouth at bedtime.    No facility-administered encounter medications on  file as of 06/12/2022.   Reviewed chart for medication changes ahead of medication coordination call.  BP Readings from Last 3 Encounters:  05/06/22 112/68  03/16/22 126/66  03/04/22 102/60    Lab Results  Component Value Date   HGBA1C 7.8 (A) 03/16/2022     Patient obtains medications through Adherence Packaging  30 Days    Last adherence delivery included: Empagliflozin (Jardiance) 10 mg: one tablet at breakfast Glimepiride (Amaryl) 4 mg: one tablet at breakfast Rosuvastatin 20 mg: one tablet at bedtime Metformin (Glucophage) 500 mg: two tablets at breakfast and two at dinner Lisinopril (Zestril) 10 mg: one tablet at dinner Pioglitazone (Actos) 45 mg: one tablet at breakfast   Patient asked to fill One Touch Ultra Test Strips One Touch Delica Lancets    Patient is due for next adherence delivery on: 06/24/2022  This delivery to include: Empagliflozin (Jardiance) 10 mg: one tablet at breakfast Glimepiride (Amaryl) 4 mg: one tablet at breakfast Rosuvastatin 20 mg: one tablet at bedtime Metformin (Glucophage) 500 mg: two tablets at breakfast and two at dinner Lisinopril (Zestril) 10 mg: one tablet at dinner Pioglitazone (Actos) 45 mg: one tablet at breakfast  Coordinated acute fill: No acute fills needed  Patient declined the following medications:  One Touch Ultra Test Strips One Touch Delica Lancets  Confirmed delivery date of 06/24/2022, advised patient that pharmacy will contact them the morning of delivery.  Care Gaps: AWV - scheduled  05/05/2023 Last BP - 112/68 on 05/06/2022 Last A1C - 7.8 on 03/16/2022 Covid  - overdue Urine ACR - overdue Flu - due Eye exam - overdue Colonoscopy - overdue Shingrix - postponed Pneumonia Vaccine - postponed  Star Rating Drugs: Glimepiride 4 mg - last filled 05/19/2022 30 DS at [facility] Jardiance 10 mg - last filled 05/19/2022 30 [facility] at [facility] Lisinopril 10 mg - last filled 05/19/2022 30 [facility] at [facility] Metformin 1000 mg -  last filled 05/19/2022 30 [facility] at [facility] Pioglitazone 45 mg - last filled 05/19/2022 30 [facility] Rosuvastatin 20 mg - last filled 05/19/2022 30 DS at [facility] Ozempic 2 mg - filled by [facility]  [facility] Pharmacist Assistant [phone]

## 2022-06-12 NOTE — Telephone Encounter (Signed)
Pt just called to say he had a VV today with MD. Pt then contacted pharmacy and there is an error with the medication and Pt needs a call back immediately, because he is very confused.  [phone]

## 2022-06-12 NOTE — Telephone Encounter (Signed)
Patient stated [facility] does not have Molnupiravir in stock. Ok to send to [facility] per patient request?

## 2022-06-15 NOTE — Telephone Encounter (Signed)
Molnupiravir was previously sent on 06/12/2022 to [facility]. I spoke with the patient and he reported he never received rx that was sent. Patient stated he is feeling better currently and will follow up with any worsening symptoms.

## 2022-06-15 NOTE — Telephone Encounter (Signed)
I was sorry to hear that his pharmacy did not have the medication in stock.  That is the first time of heard of any of the COVID antiviral drugs not being in stock.  At this point, if he was unable to get the molnupiravir filled he is past 5-day window and would not recommend anyway if he is improving.  [name] MD  Primary Care at [facility]

## 2022-06-16 ENCOUNTER — Ambulatory Visit: Payer: Medicare Other | Admitting: Family Medicine

## 2022-06-16 ENCOUNTER — Ambulatory Visit: Payer: Medicare Other

## 2022-06-18 ENCOUNTER — Telehealth: Payer: Self-pay | Admitting: Pharmacist

## 2022-06-18 NOTE — Progress Notes (Signed)
Renewal Application for Ozempic 2024 pre filled to be given to patient at upcoming appointment in office with [place] Clinical Pharmacist Assistant [phone]

## 2022-06-29 ENCOUNTER — Telehealth: Payer: Self-pay | Admitting: Pharmacist

## 2022-06-29 NOTE — Progress Notes (Unsigned)
Chronic Care Management Pharmacy Note  06/30/2022 Name:  Lucas Warren MRN:  [id number] DOB:  10-Aug-1946  Summary: A1c not at goal < 7%   Recommendations/Changes made from today's visit: -Demonstrated proper use and application of Dexcom CGM -Recommended food diary -Recommend repeat urine microalbumin -Submitted tier exception for Jardiance per patient request  Plan: Follow up in 2-3 weeks on Dexcom readings and food diary   Subjective: Lucas Warren is an 76 y.o. year old male who is a primary patient of Burchette, Alinda Sierras, MD.  The CCM team was consulted for assistance with disease management and care coordination needs.    Engaged with patient by telephone for follow up visit in response to provider referral for pharmacy case management and/or care coordination services.   Consent to Services:  The patient was given information about Chronic Care Management services, agreed to services, and gave verbal consent prior to initiation of services.  Please see initial visit note for detailed documentation.   Patient Care Team: [name], MD as PCP - General (Family Medicine) Jettie Booze, MD as PCP - Cardiology (Cardiology) Viona Gilmore, [facility] as Pharmacist (Pharmacist)  Recent office visits: 06/12/22 [name], MD: Patient presented for video visit due to COVID 19 infection. Prescribed molnupiravir.   04/30/22 Rolene Arbour, LPN: Patient presented for AWV.  03/16/22 Burchette, Alinda Sierras, MD - Patient presented for Poorly controlled type 2 diabetes and other concerns. Stopped Ipratropium Bromide.  Recent consult visits: 05/29/2022 Roney Marion MD (allergy) - Patient was seen for other allergic rhinitis. Started Xyzal 5 mg. Follow up with Dr. Nelva Bush in 4-6 weeks or sooner if you want to start allergy injections.  05/06/22 Kennith Gain, MD (Allergy) - Patient presented for Chronic rhinitis and other concerns. Prescribed Ipratropium  Bromide 0.06 %.   03/04/22 Jettie Booze, MD (Cardiology) - Patient presented for CAD involving native coronary artery of native heart with other form of angina pectoris and other concerns. No medication changes.  Hospital visits: None in previous 6 months  Objective:  Lab Results  Component Value Date   CREATININE 1.61 (H) 11/26/2021   BUN 22 11/26/2021   GFR 42.05 (L) 11/26/2021   GFRNONAA 42 (L) 06/11/2020   GFRAA 48 (L) 06/11/2020   NA 139 11/26/2021   K 4.0 11/26/2021   CALCIUM 10.0 11/26/2021   CO2 22 11/26/2021   GLUCOSE 241 (H) 11/26/2021    Lab Results  Component Value Date/Time   HGBA1C 7.3 (A) 06/30/2022 10:46 AM   HGBA1C 7.8 (A) 03/16/2022 08:22 AM   HGBA1C 8.3 (H) 12/25/2015 08:39 AM   HGBA1C 8.4 10/14/2015 12:00 AM   HGBA1C 7.3 (H) 05/22/2015 09:53 AM   GFR 42.05 (L) 11/26/2021 11:14 AM   GFR 46.97 (L) 09/15/2021 08:57 AM   MICROALBUR 2.5 06/11/2020 08:23 AM   MICROALBUR 4.3 (H) 01/10/2018 08:54 AM    Last diabetic Eye exam:  Lab Results  Component Value Date/Time   HMDIABEYEEXA No Retinopathy 04/02/2020 12:00 AM    Last diabetic Foot exam:  Lab Results  Component Value Date/Time   HMDIABFOOTEX normal 03/25/2016 12:00 AM     Lab Results  Component Value Date   CHOL 109 09/15/2021   HDL 50.50 09/15/2021   LDLCALC 37 09/15/2021   TRIG 109.0 09/15/2021   CHOLHDL 2 09/15/2021       Latest Ref Rng & Units 11/26/2021   11:14 AM 09/15/2021    8:57 AM 06/11/2020    8:31  AM  Hepatic Function  Total Protein 6.0 - 8.3 g/dL 7.2  7.1  6.7   Albumin 3.5 - 5.2 g/dL 4.5  4.3    AST 0 - 37 U/L _0 ALT 0 - 53 U/L _1 Alk Phosphatase 39 - 117 U/L 44  39    Total Bilirubin 0.2 - 1.2 mg/dL 0.4  0.4  0.4   Bilirubin, Direct 0.0 - 0.3 mg/dL  0.1      Lab Results  Component Value Date/Time   TSH 0.71 11/26/2021 11:14 AM   TSH 0.86 07/24/2015 12:35 PM       Latest Ref Rng & Units 11/26/2021   11:14 AM 05/15/2020   10:45 AM 11/11/2018    12:56 PM  CBC  WBC 4.0 - 10.5 K/uL 7.4  8.3  9.1   Hemoglobin 13.0 - 17.0 g/dL 11.8  12.9  13.9   Hematocrit 39.0 - 52.0 % 35.7  38.6  42.0   Platelets 150.0 - 400.0 K/uL 218.0  227  222.0     No results found for: "VD25OH"  Clinical ASCVD: No  The ASCVD Risk score (Arnett DK, et al., 2019) failed to calculate for the following reasons:   The valid total cholesterol range is 130 to 320 mg/dL       04/30/2022    9:29 AM 11/26/2021   10:39 AM 06/13/2021    8:37 AM  Depression screen PHQ 2/9  Decreased Interest 0 2 0  Down, Depressed, Hopeless 0 0 0  PHQ - 2 Score 0 2 0  Altered sleeping  0   Tired, decreased energy  1   Change in appetite  1   Feeling bad or failure about yourself   0   Trouble concentrating  0   Moving slowly or fidgety/restless  0   Suicidal thoughts  0   PHQ-9 Score  4   Difficult doing work/chores  Not difficult at all       Social History   Tobacco Use  Smoking Status Former   Packs/day: 2.00   Years: 20.00   Total pack years: 40.00   Types: Cigarettes   Quit date: 08/31/1981   Years since quitting: 40.8  Smokeless Tobacco Never   BP Readings from Last 3 Encounters:  06/30/22 100/60  06/12/22 126/84  05/06/22 112/68   Pulse Readings from Last 3 Encounters:  06/30/22 66  06/12/22 74  05/06/22 74   Wt Readings from Last 3 Encounters:  06/30/22 195 lb 6.4 oz (88.6 kg)  06/12/22 195 lb (88.5 kg)  05/06/22 204 lb 12.8 oz (92.9 kg)   BMI Readings from Last 3 Encounters:  06/30/22 27.25 kg/m  06/12/22 27.20 kg/m  05/06/22 28.56 kg/m    Assessment/Interventions: Review of patient past medical history, allergies, medications, health status, including review of consultants reports, laboratory and other test data, was performed as part of comprehensive evaluation and provision of chronic care management services.   SDOH:  (Social Determinants of Health) assessments and interventions performed: Yes  (last 05/26/22) SDOH Interventions     Flowsheet Row Chronic Care Management from 05/26/2022 in [place] at [place] from 04/30/2022 in [place] at [place] from 04/29/2021 in [place] at [place] Management from 05/07/2020 in [place] at [place] from 04/26/2020 in [place] at [place] Management from 03/18/2020 in [place] at [place]  SDOH Interventions  Food Insecurity Interventions -- Intervention Not Indicated Intervention Not Indicated -- Intervention Not Indicated --  Housing Interventions -- Intervention Not Indicated Intervention Not Indicated -- Intervention Not Indicated --  Transportation Interventions -- Intervention Not Indicated Intervention Not Indicated Intervention Not Indicated Intervention Not Indicated --  Financial Strain Interventions _0  Other (Comment)  [Patient assistance forms mailed and faxed for Januvia and Jardiance]  Physical Activity Interventions -- Intervention Not Indicated Intervention Not Indicated -- Intervention Not Indicated --  Stress Interventions -- Intervention Not Indicated Intervention Not Indicated -- Intervention Not Indicated --  Social Connections Interventions -- Intervention Not Indicated Intervention Not Indicated -- Intervention Not Indicated --       CCM Care Plan  Allergies  Allergen Reactions   Lisinopril Cough   Erythromycin     REACTION: abd pain   Trulicity [Dulaglutide] Nausea Only    Medications Reviewed Today     Reviewed by Nilda Riggs, CMA (Certified Medical Assistant) on 06/30/22 at [place] List Status: <None>   Medication Order Taking? Sig Documenting Provider Last Dose Status Informant  aspirin 81 MG tablet [id number] Yes Take 1 tablet by mouth at bedtime.  [provider] Taking Active Self  glimepiride (AMARYL) 4 MG tablet [id number] Yes TAKE ONE TABLET BY MOUTH EVERY MORNING Burchette, Alinda Sierras, MD Taking Active   ipratropium (ATROVENT) 0.06 % nasal spray [id number] Yes Place 2 sprays each nostril up to 4 times a day for symptoms relief Kennith Gain, MD Taking Active   JARDIANCE 10 MG TABS tablet [id number] Yes TAKE ONE TABLET BY MOUTH EVERY MORNING Burchette, Alinda Sierras, MD Taking Active   Lancets (ONETOUCH DELICA PLUS [id number]) [place] [id number] Yes USE ONE TO CHECK GLUCOSE TWICE DAILY Burchette, Alinda Sierras, MD Taking Active   levocetirizine (XYZAL) 5 MG tablet [id number] Yes Take 1 tablet (5 mg total) by mouth every evening. Roney Marion, MD Taking Active   lisinopril (ZESTRIL) 10 MG tablet [id number] Yes TAKE ONE TABLET BY MOUTH EVERY EVENING Burchette, Alinda Sierras, MD Taking Active   metFORMIN (GLUCOPHAGE) 1000 MG tablet [id number] Yes Take 1 tablet (1,000 mg total) by mouth 2 (two) times daily with a meal. Burchette, Alinda Sierras, MD Taking Active   nitroGLYCERIN (NITROSTAT) 0.4 MG SL tablet [id number] Yes Take one tablet sublingual as needed for chest pain up to three doses as needed. [name], MD Taking Active   [facility] ULTRA test strip [id number] Yes USE ONE STRIP TWICE DAILY TO CHECK BLOOD SUGARS [name], MD Taking Active   pioglitazone (ACTOS) 45 MG tablet [id number] Yes TAKE ONE TABLET BY MOUTH EVERY MORNING Burchette, Alinda Sierras, MD Taking Active   rosuvastatin (CRESTOR) 20 MG tablet [id number] Yes TAKE ONE TABLET BY MOUTH EVERYDAY AT BEDTIME Jettie Booze, MD Taking Active   Semaglutide, 2 MG/DOSE, 8 MG/3ML SOPN [id number] Yes Inject 2 mg as directed once a week. [name], MD Taking Active   tadalafil (CIALIS) 20 MG tablet [id number] Yes TAKE ONE TABLET BY MOUTH every other DAY AS NEEDED Burchette, Alinda Sierras, MD Taking Active   vitamin B-12 (CYANOCOBALAMIN) 1000 MCG tablet [id number] Yes Take 1,000 mcg by mouth at bedtime.   [provider] Taking Active Self            Patient Active Problem List   Diagnosis Date Noted   Erectile dysfunction 03/11/2020   CKD (chronic kidney disease) stage 3,  GFR 30-59 ml/min (HCC) 11/06/2019   Laryngopharyngeal reflux (LPR) 06/12/2019   Perennial allergic rhinitis 06/12/2019   Chronic right shoulder pain 09/24/2016   Cough 08/19/2016   Hemoptysis 08/19/2016   Normocytic anemia 06/29/2016   Hypertension 12/25/2015   Obesity (BMI 30-39.9) 02/02/2014   Acute URI 10/10/2012   Overweight 04/05/2012   Venous insufficiency 04/02/2011   HEADACHE 02/25/2008   CHEST PAIN, ATYPICAL 02/25/2008   HYPERCHOLESTEROLEMIA 02/24/2008   Poorly controlled type 2 diabetes mellitus ([place]) 02/23/2008   ANXIETY 02/23/2008   GERD 02/23/2008   PSORIASIS 02/23/2008   DEGENERATIVE JOINT DISEASE 02/23/2008   COLONIC POLYPS, HX OF 02/23/2008    Immunization History  Administered Date(s) Administered   Fluad Quad(high Dose 65+) 05/09/2019, 06/11/2020   Influenza Split 10/05/2011   Influenza Whole 07/16/2009   Influenza, High Dose Seasonal PF 05/22/2015, 06/29/2016, 06/15/2017, 05/20/2018   Influenza,inj,Quad PF,6+ Mos 07/07/2013, 05/04/2014   PFIZER(Purple Top)SARS-COV-2 Vaccination 10/22/2019, 11/15/2019, 07/09/2020   Pneumococcal Conjugate-13 08/21/2014   Pneumococcal Polysaccharide-23 12/05/2012   Pneumococcal-Unspecified 10/11/2007   Td 03/09/2006   Tdap 03/25/2016   Zoster, Live 03/31/2016   Patient sampled with Dexcom G7 CGM supplies and presents today for education on use and initial placement.  I have reviewed proper use, including but not limited to: glucose direction and how to incorporate it in treatment decisions including meal insulin and exercise, evaluating previous evening trends first thing in the morning, and downloading and attaching to email.  Sensor lag was explained. Oriented to the reader device and reminded not to discard Transmitter when the sensors are  replaced every 10 days. Set lower BG alarm to 70 and upper BG alarm to 250.  No calibration and no finger sticks needed. 2 hour warm up and staying within 20 feet of transmitter reviewed. Advised not to administer insulin within 3 inches of sensor.  Procedure and information discussed with patient. Patient has no further questions at this time. Verbalizes understanding and agrees to have CGM placed.  right arm cleaned and prepared as instructed by manufacturers instructions. Dexcom G7 CGM placed as instructed by manufacturers instructions. No redness, swelling, bleeding, or bruising noted. Sensor activated and monitoring at this time. Patient is aware to resume normal activities including bathing, getting dressed, and exercise.   Plan to follow up in 10-20 days with pharmacist or as instructed by provider.   Conditions to be addressed/monitored:  Hypertension, Hyperlipidemia, Diabetes, Chronic Kidney Disease and ED  Conditions addressed this visit: Diabetes, CKD  Care Plan : CCM Pharmacy Care Plan  Updates made by Viona Gilmore, [place] since 06/30/2022 12:00 AM     Problem: Problem: Hypertension, Hyperlipidemia, Diabetes, Chronic Kidney Disease and ED      Long-Range Goal: Patient-Specific Goal   Start Date: 11/20/2020  Expected End Date: 11/20/2021  Recent Progress: On track  Priority: High  Note:   Current Barriers:  Unable to independently monitor therapeutic efficacy Unable to achieve control of diabetes   Pharmacist Clinical Goal(s):  Patient will achieve adherence to monitoring guidelines and medication adherence to achieve therapeutic efficacy achieve control of diabetes as evidenced by A1c  through collaboration with PharmD and provider.   Interventions: 1:1 collaboration with [name], MD regarding development and update of comprehensive plan of care as evidenced by provider attestation and co-signature Inter-disciplinary care team collaboration (see  longitudinal plan of care) Comprehensive medication review performed; medication list updated in electronic medical record  Hypertension (BP goal <140/90) -Controlled -Current treatment: Lisinopril 31m, 1 tablet once  daily - Appropriate, Effective, Safe, Accessible -Medications previously tried: isosorbide  -Current home readings: not checking regularly -Current dietary habits: did not discuss  -Current exercise habits: did not discuss -Denies hypotensive/hypertensive symptoms -Educated on Exercise goal of 150 minutes per week; Importance of home blood pressure monitoring; -Counseled to monitor BP at home weekly, document, and provide log at future appointments -Counseled on diet and exercise extensively Recommended to continue current medication  Hyperlipidemia: (LDL goal < 55) -Controlled -Current treatment: Rosuvastatin 81m, 1 tablet once daily - Appropriate, Effective, Safe, Accessible -Medications previously tried:  rosuvastatin (elevated BGs), simvastatin (myalgias)  -Current dietary patterns: did not discuss -Current exercise habits: did not discuss -Educated on Cholesterol goals;  Importance of limiting foods high in cholesterol; Exercise goal of 150 minutes per week; -Recommended to continue current medication  Diabetes (A1c goal <7%) -Uncontrolled -Current medications: Glimepiride 452m 1/2 tablet once daily with breakfast - Appropriate, Query effective, Safe, Accessible Empagliflozin (Jardiance) 1040m1 tablet once daily - Appropriate, Query effective, Safe, Accessible Metformin 500m59m tablets twice daily - Appropriate, Query effective, Safe, Accessible Pioglitazone (Actos) 45mg20mtablet once daily -  Appropriate, Query effective, Safe, Accessible Ozempic, inject 2 mg once weekly - Appropriate, Query effective, Safe, Accessible -Medications previously tried: Onglyza (cost), Januvia (switch to GLP1), Trulicity (nausea)  -Current home glucose readings fasting  glucose: 80, 106, 141, 89, 70, 112, 88, 129, 90, 101, 88, 87, 76, 85, 95, 133, 95 (80-120 in the morning) post prandial glucose: not checking -Denies hypoglycemic/hyperglycemic symptoms -Current meal patterns:  breakfast: jimmy dean sausage biscuits x 2, blueberry muffin lunch: sandwich, beans, franks  dinner: cheeseburger; apple pie; frozen stuff peppers; [place] mac n'cheese and apples snacks: ice cream sandwiches, klondike bar drinks: n/a -Current exercise: little walking; plans to do more with cooler weather -Educated on Exercise goal of 150 minutes per week; Benefits of routine self-monitoring of blood sugar; Carbohydrate counting and/or plate method -Counseled to check feet daily and get yearly eye exams -Counseled on diet and exercise extensively Recommended to continue current medication Recommended checking blood sugars 1-2 hours after meals.  CKD (Goal: improve kidney function) -Controlled -Current treatment  Jardiance 10 mg 1 tablet daily - Appropriate, Effective, Safe, Accessible -Medications previously tried: none  -Recommended to continue current medication  CAD (Goal: prevent heart attacks and strokes) -Controlled -Current treatment  Aspirin 81 mg 1 tablet daily - Appropriate, Effective, Safe, Accessible -Medications previously tried: none  -Counseled on monitoring for bleeding and bruising  ED (Goal: minimize symptoms) -Controlled -Current treatment  Cialis 5 mg 1 tablet as needed - Appropriate, Effective, Safe, Accessible -Medications previously tried: sildenafil (no longer effective)  -Recommended to continue current medication  Health Maintenance -Vaccine gaps: shingrix, COVID booster, influenza -Current therapy:  Vitamin B12 1000 mcg 1 tablet daily -Educated on Cost vs benefit of each product must be carefully weighed by individual consumer -Patient is satisfied with current therapy and denies issues -Recommended to continue current  medication  Patient Goals/Self-Care Activities Patient will:  - take medications as prescribed check glucose daily, document, and provide at future appointments check blood pressure weekly, document, and provide at future appointments target a minimum of 150 minutes of moderate intensity exercise weekly engage in dietary modifications by eating more non-starchy vegetables than carbs  Follow Up Plan: Telephone follow up appointment with care management team member scheduled for: 3 weeks       Medication Assistance:  Ozempic obtained through [employer] medication assistance program.  Enrollment ends 08/30/22  Compliance/Adherence/Medication fill history: Care Gaps: Shingrix, COVID booster, influenza, Prevnar 20, eye exam, UACR , colonoscopy BP: 112/68 (05/06/22) A1c: 7.8% (03/16/22)  Star-Rating Drugs: Jardiance 10 mg - Last filed 05/19/22 30 DS at [facility] Rosuvastatin 20 mg - Last filled 05/19/22 30 DS at [facility] Lisinopril 10 mg- Last filled 05/19/22 30 DS at [facility] Pioglitazone 45 mg - Last filled 05/19/22 30 DS at [facility] Metformin 500 mg - Last filled 05/19/22 30 DS at [facility] Ozempic 2 mg - obtained through PAP Glimepiride 4 mg - last filled 05/19/22 for 30 DS at [facility]  Patient's preferred pharmacy is:  [facility] - [place], [place] - [address] [place] [address] Phone: [phone] Fax: [place], [place] [address] [place] [address] Phone: [phone] Fax: [phone]   Uses pill box? No - adherence packaging Pt endorses 100% compliance  We discussed: Benefits of medication synchronization, packaging and delivery as well as enhanced pharmacist oversight with [facility]. Patient decided to: Utilize [facility] pharmacy for medication synchronization, packaging and delivery  Care Plan and Follow Up Patient Decision:  Patient  agrees to Care Plan and Follow-up.  Plan: Telephone follow up appointment with care management team member scheduled for:  2-3 weeks  [name], PharmD [facility] Clinical Pharmacist [place] at [place] [phone]

## 2022-06-29 NOTE — Chronic Care Management (AMB) (Signed)
    Chronic Care Management Pharmacy Assistant   Name: Lucas Warren  MRN: [id number] DOB: 06/08/1946  06/29/22  APPOINTMENT REMINDER  Patient was reminded to have all medications, supplements and any blood glucose and blood pressure readings available for review with [name], Pharm. D, for telephone visit on 06/30/22  at 10.    Care Gaps: COVID Booster - Overdue Diabetic Urine - Overdue Flu Vaccine - Overdue Eye Exam - Overdue Colonoscopy - Overdue BP- 112/68 05/06/22 AWV- 8/23 Lab Results  Component Value Date   HGBA1C 7.8 (A) 03/16/2022    Star Rating Drug: Jardiance 10 mg - Last filed 05/19/22 30 DS at [facility] Rosuvastatin 20 mg - Last filled 05/19/22 30 DS at [facility] Lisinopril 10 mg- Last filled 05/19/22 30 DS at [facility] Pioglitazone 45 mg - Last filled 05/19/22 30 DS at [facility] Metformin 500 mg - Last filled 05/19/22 30 DS at [facility] Ozempic 2 mg - obtained through PAP     Medications: Outpatient Encounter Medications as of 06/29/2022  Medication Sig   aspirin 81 MG tablet Take 1 tablet by mouth at bedtime.    glimepiride (AMARYL) 4 MG tablet TAKE ONE TABLET BY MOUTH EVERY MORNING   ipratropium (ATROVENT) 0.06 % nasal spray Place 2 sprays each nostril up to 4 times a day for symptoms relief   JARDIANCE 10 MG TABS tablet TAKE ONE TABLET BY MOUTH EVERY MORNING   Lancets (ONETOUCH DELICA PLUS [id number]) MISC USE ONE TO CHECK GLUCOSE TWICE DAILY   levocetirizine (XYZAL) 5 MG tablet Take 1 tablet (5 mg total) by mouth every evening.   lisinopril (ZESTRIL) 10 MG tablet TAKE ONE TABLET BY MOUTH EVERY EVENING   metFORMIN (GLUCOPHAGE) 1000 MG tablet Take 1 tablet (1,000 mg total) by mouth 2 (two) times daily with a meal.   nitroGLYCERIN (NITROSTAT) 0.4 MG SL tablet Take one tablet sublingual as needed for chest pain up to three doses as needed.   ONETOUCH ULTRA test strip USE ONE STRIP TWICE DAILY TO CHECK BLOOD SUGARS   pioglitazone (ACTOS) 45 MG tablet TAKE ONE  TABLET BY MOUTH EVERY MORNING   rosuvastatin (CRESTOR) 20 MG tablet TAKE ONE TABLET BY MOUTH EVERYDAY AT BEDTIME   Semaglutide, 2 MG/DOSE, 8 MG/3ML SOPN Inject 2 mg as directed once a week.   tadalafil (CIALIS) 20 MG tablet TAKE ONE TABLET BY MOUTH every other DAY AS NEEDED   vitamin B-12 (CYANOCOBALAMIN) 1000 MCG tablet Take 1,000 mcg by mouth at bedtime.    No facility-administered encounter medications on file as of 06/29/2022.      [place] Clinical Pharmacist Assistant [phone]

## 2022-06-30 ENCOUNTER — Ambulatory Visit: Payer: Medicare Other | Admitting: Family Medicine

## 2022-06-30 ENCOUNTER — Encounter: Payer: Self-pay | Admitting: Family Medicine

## 2022-06-30 ENCOUNTER — Ambulatory Visit (INDEPENDENT_AMBULATORY_CARE_PROVIDER_SITE_OTHER): Payer: Medicare Other | Admitting: Family Medicine

## 2022-06-30 ENCOUNTER — Ambulatory Visit (INDEPENDENT_AMBULATORY_CARE_PROVIDER_SITE_OTHER): Payer: Medicare Other | Admitting: Pharmacist

## 2022-06-30 VITALS — BP 100/60 | HR 66 | Temp 97.6°F | Ht 71.0 in | Wt 195.4 lb

## 2022-06-30 DIAGNOSIS — Z23 Encounter for immunization: Secondary | ICD-10-CM

## 2022-06-30 DIAGNOSIS — Z7985 Long-term (current) use of injectable non-insulin antidiabetic drugs: Secondary | ICD-10-CM

## 2022-06-30 DIAGNOSIS — E785 Hyperlipidemia, unspecified: Secondary | ICD-10-CM

## 2022-06-30 DIAGNOSIS — E1165 Type 2 diabetes mellitus with hyperglycemia: Secondary | ICD-10-CM | POA: Diagnosis not present

## 2022-06-30 DIAGNOSIS — N189 Chronic kidney disease, unspecified: Secondary | ICD-10-CM | POA: Diagnosis not present

## 2022-06-30 DIAGNOSIS — E1122 Type 2 diabetes mellitus with diabetic chronic kidney disease: Secondary | ICD-10-CM | POA: Diagnosis not present

## 2022-06-30 DIAGNOSIS — I10 Essential (primary) hypertension: Secondary | ICD-10-CM

## 2022-06-30 DIAGNOSIS — R251 Tremor, unspecified: Secondary | ICD-10-CM

## 2022-06-30 DIAGNOSIS — I129 Hypertensive chronic kidney disease with stage 1 through stage 4 chronic kidney disease, or unspecified chronic kidney disease: Secondary | ICD-10-CM

## 2022-06-30 DIAGNOSIS — Z7984 Long term (current) use of oral hypoglycemic drugs: Secondary | ICD-10-CM

## 2022-06-30 DIAGNOSIS — I2510 Atherosclerotic heart disease of native coronary artery without angina pectoris: Secondary | ICD-10-CM | POA: Diagnosis not present

## 2022-06-30 LAB — POCT GLYCOSYLATED HEMOGLOBIN (HGB A1C): Hemoglobin A1C: 7.3 % — AB (ref 4.0–5.6)

## 2022-06-30 LAB — MICROALBUMIN / CREATININE URINE RATIO
Creatinine,U: 113 mg/dL
Microalb Creat Ratio: 10.7 mg/g (ref 0.0–30.0)
Microalb, Ur: 12.1 mg/dL — ABNORMAL HIGH (ref 0.0–1.9)

## 2022-06-30 NOTE — Progress Notes (Signed)
Established Patient Office Visit  Subjective   Patient ID: Lucas Warren, male    DOB: 13-Apr-1946  Age: 76 y.o. MRN: [id number]  Chief Complaint  Patient presents with   Follow-up    HPI   Mr. Lucas Warren is seen for medical follow-up.  He has history of type 2 diabetes which has been poorly controlled in past but gradually improving after addition of Ozempic.  Tolerating well.  His weight is down another 6 pounds since last July.  He had Dexcom meter placed earlier today per our clinical pharmacist.  His A1c's have been steadily improving from 8.1 to 7.8 to 7.3% today.  Does have occasional "jitters "which sometimes improves after eating M&Ms.  He does take glimepiride 4 mg 1/2 tablet daily in addition to metformin, Actos, Jardiance, and Ozempic.  Does need urine micro-albumin  Hypertension is controlled on lisinopril.  He remains on rosuvastatin for hyperlipidemia.  He has noticed some very mild intermittent tremor upper extremities especially late in the day and with activity such as working in his shop.  Minimal caffeine use.  No known family history of hereditary tremor.  Past Medical History:  Diagnosis Date   Allergy    seasonal   Anxiety    Chest pain, atypical    Colon polyps    hyperplastic 2003   DJD (degenerative joint disease)    DM (diabetes mellitus) ([place])    GERD (gastroesophageal reflux disease)    History of headache    Hypercholesteremia    Psoriasis    Past Surgical History:  Procedure Laterality Date   CATARACT EXTRACTION, BILATERAL Bilateral    Dr. Tommy Rainwater;    [facility] CATH AND CORONARY ANGIOGRAPHY N/A 05/20/2020   Procedure: LEFT HEART CATH AND CORONARY ANGIOGRAPHY;  Surgeon: Jettie Booze, MD;  Location: [facility];  Service: Cardiovascular;  Laterality: N/A;   NASAL SEPTUM SURGERY  1989   SINOSCOPY     TONSILLECTOMY      reports that he quit smoking about 40 years ago. His smoking use included  cigarettes. He has a 40.00 pack-year smoking history. He has never used smokeless tobacco. He reports current alcohol use. He reports that he does not use drugs. family history includes Alzheimer's disease in his father; Heart disease in his father; Leukemia in his sister; Lung cancer in his mother. Allergies  Allergen Reactions   Lisinopril Cough   Erythromycin     REACTION: abd pain   Trulicity [Dulaglutide] Nausea Only    Review of Systems  Constitutional:  Negative for chills and fever.  Respiratory:  Negative for shortness of breath.   Cardiovascular:  Negative for chest pain.  Neurological:  Positive for tremors. Negative for dizziness and headaches.      Objective:     BP 100/60 (BP Location: Left Arm, Patient Position: Sitting, Cuff Size: Normal)   Pulse 66   Temp 97.6 F (36.4 C) (Oral)   Ht '5\' 11"'$  (1.803 m)   Wt 195 lb 6.4 oz (88.6 kg)   SpO2 99%   BMI 27.25 kg/m    Physical Exam Vitals reviewed.  Constitutional:      Appearance: Normal appearance.  Cardiovascular:     Rate and Rhythm: Normal rate and regular rhythm.  Pulmonary:     Effort: Pulmonary effort is normal.     Breath sounds: Normal breath sounds. No wheezing or rales.  Musculoskeletal:     Right lower leg: No  edema.     Left lower leg: No edema.  Neurological:     General: No focal deficit present.     Mental Status: He is alert and oriented to person, place, and time.     Cranial Nerves: No cranial nerve deficit.     Comments: No significant tremor noted at this time.  No cogwheel rigidity.  Gait normal.      Results for orders placed or performed in visit on 06/30/22  POCT glycosylated hemoglobin (Hb A1C)  Result Value Ref Range   Hemoglobin A1C 7.3 (A) 4.0 - 5.6 %   HbA1c POC (<> result, manual entry)     HbA1c, POC (prediabetic range)     HbA1c, POC (controlled diabetic range)        The ASCVD Risk score (Arnett DK, et al., 2019) failed to calculate for the following reasons:    The valid total cholesterol range is 130 to 320 mg/dL    Assessment & Plan:   #1 type 2 diabetes improving with A1c today 7.3%.  He is describing some possible mild hypoglycemic symptoms.  He had Dexcom external monitor placed today which will help to monitor this.  We did discuss switching after he runs out of current prescription from glimepiride to glipizide 5 mg once daily.  If A1c is continue to improve would consider stopping sulfonylurea altogether -Check urine microalbumin screen -Be in touch if frequent blood sugars less than 70  #2 hypertension stable and well-controlled on lisinopril 10 mg daily.  Continue current dosage  #3 patient describes mild bilateral upper extremity tremor.  Question essential tremor.  Possibly worse with movement and activity.  Does not have any obvious other findings to suggest likely Parkinson's disease but monitor closely at this time  Set up 28-monthfollow-up and plan to get fasting lipids then along with blood chemistries  -Flu vaccine given today   Return in about 3 months (around 09/30/2022).    BCarolann Littler MD

## 2022-06-30 NOTE — Patient Instructions (Signed)
Visit Information   Goals Addressed   None    Patient Care Plan: CCM Pharmacy Care Plan     Problem Identified: Problem: Hypertension, Hyperlipidemia, Diabetes, Chronic Kidney Disease and ED      Long-Range Goal: Patient-Specific Goal   Start Date: 11/20/2020  Expected End Date: 11/20/2021  Recent Progress: On track  Priority: High  Note:   Current Barriers:  Unable to independently monitor therapeutic efficacy Unable to achieve control of diabetes   Pharmacist Clinical Goal(s):  Patient will achieve adherence to monitoring guidelines and medication adherence to achieve therapeutic efficacy achieve control of diabetes as evidenced by A1c  through collaboration with PharmD and provider.   Interventions: 1:1 collaboration with [name], MD regarding development and update of comprehensive plan of care as evidenced by provider attestation and co-signature Inter-disciplinary care team collaboration (see longitudinal plan of care) Comprehensive medication review performed; medication list updated in electronic medical record  Hypertension (BP goal <140/90) -Controlled -Current treatment: Lisinopril 38m, 1 tablet once daily - Appropriate, Effective, Safe, Accessible -Medications previously tried: isosorbide  -Current home readings: not checking regularly -Current dietary habits: did not discuss  -Current exercise habits: did not discuss -Denies hypotensive/hypertensive symptoms -Educated on Exercise goal of 150 minutes per week; Importance of home blood pressure monitoring; -Counseled to monitor BP at home weekly, document, and provide log at future appointments -Counseled on diet and exercise extensively Recommended to continue current medication  Hyperlipidemia: (LDL goal < 55) -Controlled -Current treatment: Rosuvastatin 2514m 1 tablet once daily - Appropriate, Effective, Safe, Accessible -Medications previously tried:  rosuvastatin (elevated BGs), simvastatin  (myalgias)  -Current dietary patterns: did not discuss -Current exercise habits: did not discuss -Educated on Cholesterol goals;  Importance of limiting foods high in cholesterol; Exercise goal of 150 minutes per week; -Recommended to continue current medication  Diabetes (A1c goal <7%) -Uncontrolled -Current medications: Glimepiride 14m68m1/2 tablet once daily with breakfast - Appropriate, Query effective, Safe, Accessible Empagliflozin (Jardiance) 7m38m tablet once daily - Appropriate, Query effective, Safe, Accessible Metformin 500mg32mtablets twice daily - Appropriate, Query effective, Safe, Accessible Pioglitazone (Actos) 45mg,66mablet once daily -  Appropriate, Query effective, Safe, Accessible Ozempic, inject 2 mg once weekly - Appropriate, Query effective, Safe, Accessible -Medications previously tried: Onglyza (cost), Januvia (switch to GLP1), Trulicity (nausea)  -Current home glucose readings fasting glucose: 80, 106, 141, 89, 70, 112, 88, 129, 90, 101, 88, 87, 76, 85, 95, 133, 95 (80-120 in the morning) post prandial glucose: not checking -Denies hypoglycemic/hyperglycemic symptoms -Current meal patterns:  breakfast: jimmy dean sausage biscuits x 2, blueberry muffin lunch: sandwich, beans, franks  dinner: cheeseburger; apple pie; frozen stuff peppers; [place] mac n'cheese and apples snacks: ice cream sandwiches, klondike bar drinks: n/a -Current exercise: little walking; plans to do more with cooler weather -Educated on Exercise goal of 150 minutes per week; Benefits of routine self-monitoring of blood sugar; Carbohydrate counting and/or plate method -Counseled to check feet daily and get yearly eye exams -Counseled on diet and exercise extensively Recommended to continue current medication Recommended checking blood sugars 1-2 hours after meals.  CKD (Goal: improve kidney function) -Controlled -Current treatment  Jardiance 10 mg 1 tablet daily - Appropriate,  Effective, Safe, Accessible -Medications previously tried: none  -Recommended to continue current medication  CAD (Goal: prevent heart attacks and strokes) -Controlled -Current treatment  Aspirin 81 mg 1 tablet daily - Appropriate, Effective, Safe, Accessible -Medications previously tried: none  -Counseled on monitoring for bleeding and bruising  ED (Goal: minimize symptoms) -Controlled -Current treatment  Cialis 5 mg 1 tablet as needed - Appropriate, Effective, Safe, Accessible -Medications previously tried: sildenafil (no longer effective)  -Recommended to continue current medication  Health Maintenance -Vaccine gaps: shingrix, COVID booster, influenza -Current therapy:  Vitamin B12 1000 mcg 1 tablet daily -Educated on Cost vs benefit of each product must be carefully weighed by individual consumer -Patient is satisfied with current therapy and denies issues -Recommended to continue current medication  Patient Goals/Self-Care Activities Patient will:  - take medications as prescribed check glucose daily, document, and provide at future appointments check blood pressure weekly, document, and provide at future appointments target a minimum of 150 minutes of moderate intensity exercise weekly engage in dietary modifications by eating more non-starchy vegetables than carbs  Follow Up Plan: Telephone follow up appointment with care management team member scheduled for: 3 weeks       Patient verbalizes understanding of instructions and care plan provided today and agrees to view in [facility]. Active MyChart status and patient understanding of how to access instructions and care plan via MyChart confirmed with patient.    Telephone follow up appointment with pharmacy team member scheduled for: 3 weeks  Viona Gilmore, [facility]

## 2022-06-30 NOTE — Addendum Note (Signed)
Addended by: Nilda Riggs on: 06/30/2022 11:47 AM   Modules accepted: Orders

## 2022-06-30 NOTE — Patient Instructions (Signed)
Keep up the good work- A1C down to 7.3%  Let' plan on 3 week follow up  Be in touch if you are seeing frequent blood sugars < 70

## 2022-07-01 ENCOUNTER — Encounter: Payer: Self-pay | Admitting: Internal Medicine

## 2022-07-12 ENCOUNTER — Other Ambulatory Visit: Payer: Self-pay | Admitting: Interventional Cardiology

## 2022-07-13 ENCOUNTER — Telehealth: Payer: Self-pay | Admitting: Pharmacist

## 2022-07-13 NOTE — Progress Notes (Unsigned)
Chronic Care Management Pharmacy Assistant   Name: Lucas Warren  MRN: [id number] DOB: Sep 19, 1945  Reason for Encounter: Medication Review/ Medication Coordination   Recent office visits:  06/30/22 [name], MD - Patient presented for Poorly Controlled type 2 diabetes mellitus and other concerns. No medication changes.Stopped Glimepiride. Prescribed Glipizide  Recent consult visits:  None  Hospital visits:  None in previous 6 months  Medications: Outpatient Encounter Medications as of 07/13/2022  Medication Sig   aspirin 81 MG tablet Take 1 tablet by mouth at bedtime.    glipiZIDE (GLUCOTROL) 5 MG tablet Take 5 mg by mouth daily before breakfast.   ipratropium (ATROVENT) 0.06 % nasal spray Place 2 sprays each nostril up to 4 times a day for symptoms relief   JARDIANCE 10 MG TABS tablet TAKE ONE TABLET BY MOUTH EVERY MORNING   Lancets (ONETOUCH DELICA PLUS [id number]) MISC USE ONE TO CHECK GLUCOSE TWICE DAILY   levocetirizine (XYZAL) 5 MG tablet Take 1 tablet (5 mg total) by mouth every evening.   lisinopril (ZESTRIL) 10 MG tablet TAKE ONE TABLET BY MOUTH EVERY EVENING   metFORMIN (GLUCOPHAGE) 1000 MG tablet Take 1 tablet (1,000 mg total) by mouth 2 (two) times daily with a meal.   nitroGLYCERIN (NITROSTAT) 0.4 MG SL tablet Take one tablet sublingual as needed for chest pain up to three doses as needed.   ONETOUCH ULTRA test strip USE ONE STRIP TWICE DAILY TO CHECK BLOOD SUGARS   pioglitazone (ACTOS) 45 MG tablet TAKE ONE TABLET BY MOUTH EVERY MORNING   rosuvastatin (CRESTOR) 20 MG tablet TAKE ONE TABLET BY MOUTH EVERYDAY AT BEDTIME   Semaglutide, 2 MG/DOSE, 8 MG/3ML SOPN Inject 2 mg as directed once a week.   tadalafil (CIALIS) 20 MG tablet TAKE ONE TABLET BY MOUTH every other DAY AS NEEDED   vitamin B-12 (CYANOCOBALAMIN) 1000 MCG tablet Take 1,000 mcg by mouth at bedtime.    No facility-administered encounter medications on file as of 07/13/2022.   Reviewed chart  for medication changes ahead of medication coordination call.  BP Readings from Last 3 Encounters:  06/30/22 100/60  06/12/22 126/84  05/06/22 112/68    Lab Results  Component Value Date   HGBA1C 7.3 (A) 06/30/2022     Patient obtains medications through Adherence Packaging  30 Days   Last adherence delivery included:  Empagliflozin (Jardiance) 10 mg: one tablet at breakfast Glimepiride (Amaryl) 4 mg: one tablet at breakfast Rosuvastatin 20 mg: one tablet at bedtime Metformin (Glucophage) 500 mg: two tablets at breakfast and two at dinner Lisinopril (Zestril) 10 mg: one tablet at dinner Pioglitazone (Actos) 45 mg: one tablet at breakfast   Coordinated acute fill: No acute fills needed   Patient declined the following medications:  One Touch Ultra Test Strips One Touch Delica Lancets   Confirmed delivery date of 06/24/2022, advised patient that pharmacy will contact them the morning of delivery.   Patient is due for next adherence delivery on: 07/24/22. Called patient and reviewed medications and coordinated delivery. Packs 30 DS  This delivery to include: Empagliflozin (Jardiance) 10 mg: one tablet at breakfast Rosuvastatin 20 mg: one tablet at bedtime Metformin (Glucophage) 500 mg: two tablets at breakfast and two at dinner Lisinopril (Zestril) 10 mg: one tablet at dinner Pioglitazone (Actos) 45 mg: one tablet at breakfast Levocetirizine 5 mg: one tab at dinner Glipizide 5 mg: one tab at breakfast (patient aware of switch from glimepiride)  Patient declined the following medications: Strips Lancets Patient  reports he is ok as he has been using Dexcom the past month and still has some if he is needing.   Confirmed delivery date of 07/24/22, advised patient that pharmacy will contact them the morning of delivery.   Care Gaps: COVID Booster - Overdue Eye Exam - Overdue Colonoscopy - Overdue Zoster vaccine - Postponed PNA Vaccine - Postponed CCM-  07/17/22 BP-  100/60 06/30/22 AWV- 04/30/22 Lab Results  Component Value Date   HGBA1C 7.3 (A) 06/30/2022    Star Rating Drugs: Jardiance 10 mg - Last filed 06/22/22 30 DS at [facility] Rosuvastatin 20 mg - Last filled 06/22/22 30 [facility] at [facility] Lisinopril 10 mg- Last filled 06/22/22 30 [facility] at [facility] Pioglitazone 45 mg - Last filled 06/22/22 30 DS at [facility] Metformin 500 mg - Last filled 06/22/22 30 DS at [facility] Ozempic 2 mg - obtained through [facility] Pharmacist Assistant [phone]

## 2022-07-14 ENCOUNTER — Other Ambulatory Visit: Payer: Self-pay | Admitting: Family Medicine

## 2022-07-14 MED ORDER — GLIPIZIDE 5 MG PO TABS: 5.0000 mg | ORAL_TABLET | Freq: Every day | ORAL | 3 refills | Status: DC

## 2022-07-17 ENCOUNTER — Telehealth: Payer: Medicare Other

## 2022-07-20 ENCOUNTER — Telehealth: Payer: Self-pay | Admitting: Pharmacist

## 2022-07-20 NOTE — Chronic Care Management (AMB) (Signed)
    Chronic Care Management Pharmacy Assistant   Name: Lucas Warren  MRN: [id number] DOB: 1946-02-04  07/20/22 APPOINTMENT REMINDER  Patient was reminded to have all medications, supplements and any blood glucose and blood pressure readings available for review with [name], Pharm. D, for telephone visit on 07/21/22  at 11:45.    Care Gaps: COVID Booster - Overdue Eye Exam - Overdue Colonoscopy - Overdue Zoster vaccine - Postponed PNA Vaccine - Postponed BP- 100/60 06/30/22 AWV- 04/30/22 Lab Results  Component Value Date   HGBA1C 7.3 (A) 06/30/2022    Star Rating Drug: Jardiance 10 mg - Last filed 06/22/22 30 DS at [facility] Rosuvastatin 20 mg - Last filled 06/22/22 30 [facility] at [facility] Lisinopril 10 mg- Last filled 06/22/22 30 DS at [facility] Pioglitazone 45 mg - Last filled 06/22/22 30 DS at [facility] Metformin 500 mg - Last filled 06/22/22 30 DS at [facility] Ozempic 2 mg - obtained through [facility]    Medications: Outpatient Encounter Medications as of 07/20/2022  Medication Sig   aspirin 81 MG tablet Take 1 tablet by mouth at bedtime.    glipiZIDE (GLUCOTROL) 5 MG tablet Take 1 tablet (5 mg total) by mouth daily before breakfast.   ipratropium (ATROVENT) 0.06 % nasal spray Place 2 sprays each nostril up to 4 times a day for symptoms relief   JARDIANCE 10 MG TABS tablet TAKE ONE TABLET BY MOUTH EVERY MORNING   Lancets (ONETOUCH DELICA PLUS [id number]) MISC USE ONE TO CHECK GLUCOSE TWICE DAILY   levocetirizine (XYZAL) 5 MG tablet Take 1 tablet (5 mg total) by mouth every evening.   lisinopril (ZESTRIL) 10 MG tablet TAKE ONE TABLET BY MOUTH EVERY EVENING   metFORMIN (GLUCOPHAGE) 1000 MG tablet Take 1 tablet (1,000 mg total) by mouth 2 (two) times daily with a meal.   nitroGLYCERIN (NITROSTAT) 0.4 MG SL tablet Take one tablet sublingual as needed for chest pain up to three doses as needed.   ONETOUCH ULTRA test strip USE ONE STRIP TWICE DAILY TO CHECK BLOOD SUGARS    pioglitazone (ACTOS) 45 MG tablet TAKE ONE TABLET BY MOUTH EVERY MORNING   rosuvastatin (CRESTOR) 20 MG tablet TAKE ONE TABLET BY MOUTH EVERYDAY AT BEDTIME   Semaglutide, 2 MG/DOSE, 8 MG/3ML SOPN Inject 2 mg as directed once a week.   tadalafil (CIALIS) 20 MG tablet TAKE ONE TABLET BY MOUTH every other DAY AS NEEDED   vitamin B-12 (CYANOCOBALAMIN) 1000 MCG tablet Take 1,000 mcg by mouth at bedtime.    No facility-administered encounter medications on file as of 07/20/2022.     [facility] Clinical Pharmacist Assistant [phone]

## 2022-07-21 ENCOUNTER — Telehealth: Payer: Medicare Other

## 2022-07-21 ENCOUNTER — Telehealth: Payer: Self-pay | Admitting: Pharmacist

## 2022-07-21 NOTE — Progress Notes (Deleted)
Chronic Care Management Pharmacy Note  07/21/2022 Name:  Lucas Warren MRN:  [id number] DOB:  [birth year]  Summary: A1c not at goal < 7%   Recommendations/Changes made from today's visit: -Demonstrated proper use and application of Dexcom CGM -Recommended food diary -Recommend repeat urine microalbumin -Submitted tier exception for Jardiance per patient request  Plan: Follow up in 2-3 weeks on Dexcom readings and food diary   Subjective: Lucas Warren is an 76 y.o. year old male who is a primary patient of Burchette, Alinda Sierras, MD.  The CCM team was consulted for assistance with disease management and care coordination needs.    Engaged with patient by telephone for follow up visit in response to provider referral for pharmacy case management and/or care coordination services.   Consent to Services:  The patient was given information about Chronic Care Management services, agreed to services, and gave verbal consent prior to initiation of services.  Please see initial visit note for detailed documentation.   Patient Care Team: [name], MD as PCP - General (Family Medicine) Jettie Booze, MD as PCP - Cardiology (Cardiology) Viona Gilmore, [facility] as Pharmacist (Pharmacist)  Recent office visits: 06/30/22 [name], MD - Patient presented for Poorly Controlled type 2 diabetes mellitus and other concerns. No medication changes.Stopped Glimepiride. Prescribed Glipizide   06/12/22 [name], MD: Patient presented for video visit due to COVID 19 infection. Prescribed molnupiravir.   04/30/22 Rolene Arbour, LPN: Patient presented for AWV.  03/16/22 Burchette, Alinda Sierras, MD - Patient presented for Poorly controlled type 2 diabetes and other concerns. Stopped Ipratropium Bromide.  Recent consult visits: 05/29/2022 Roney Marion MD (allergy) - Patient was seen for other allergic rhinitis. Started Xyzal 5 mg. Follow up with Dr. Nelva Bush in 4-6 weeks or  sooner if you want to start allergy injections.  05/06/22 Kennith Gain, MD (Allergy) - Patient presented for Chronic rhinitis and other concerns. Prescribed Ipratropium Bromide 0.06 %.   03/04/22 Jettie Booze, MD (Cardiology) - Patient presented for CAD involving native coronary artery of native heart with other form of angina pectoris and other concerns. No medication changes.  Hospital visits: None in previous 6 months  Objective:  Lab Results  Component Value Date   CREATININE 1.61 (H) 11/26/2021   BUN 22 11/26/2021   GFR 42.05 (L) 11/26/2021   GFRNONAA 42 (L) 06/11/2020   GFRAA 48 (L) 06/11/2020   NA 139 11/26/2021   K 4.0 11/26/2021   CALCIUM 10.0 11/26/2021   CO2 22 11/26/2021   GLUCOSE 241 (H) 11/26/2021    Lab Results  Component Value Date/Time   HGBA1C 7.3 (A) 06/30/2022 10:46 AM   HGBA1C 7.8 (A) 03/16/2022 08:22 AM   HGBA1C 8.3 (H) 12/25/2015 08:39 AM   HGBA1C 8.4 10/14/2015 12:00 AM   HGBA1C 7.3 (H) 05/22/2015 09:53 AM   GFR 42.05 (L) 11/26/2021 11:14 AM   GFR 46.97 (L) 09/15/2021 08:57 AM   MICROALBUR 12.1 (H) 06/30/2022 11:24 AM   MICROALBUR 2.5 06/11/2020 08:23 AM    Last diabetic Eye exam:  Lab Results  Component Value Date/Time   HMDIABEYEEXA No Retinopathy 04/02/2020 12:00 AM    Last diabetic Foot exam:  Lab Results  Component Value Date/Time   HMDIABFOOTEX normal 03/25/2016 12:00 AM     Lab Results  Component Value Date   CHOL 109 09/15/2021   HDL 50.50 09/15/2021   LDLCALC 37 09/15/2021   TRIG 109.0 09/15/2021   CHOLHDL 2 09/15/2021  Latest Ref Rng & Units 11/26/2021   11:14 AM 09/15/2021    8:57 AM 06/11/2020    8:31 AM  Hepatic Function  Total Protein 6.0 - 8.3 g/dL 7.2  7.1  6.7   Albumin 3.5 - 5.2 g/dL 4.5  4.3    AST 0 - 37 U/L _0 ALT 0 - 53 U/L _1 Alk Phosphatase 39 - 117 U/L 44  39    Total Bilirubin 0.2 - 1.2 mg/dL 0.4  0.4  0.4   Bilirubin, Direct 0.0 - 0.3 mg/dL  0.1      Lab  Results  Component Value Date/Time   TSH 0.71 11/26/2021 11:14 AM   TSH 0.86 07/24/2015 12:35 PM       Latest Ref Rng & Units 11/26/2021   11:14 AM 05/15/2020   10:45 AM 11/11/2018   12:56 PM  CBC  WBC 4.0 - 10.5 K/uL 7.4  8.3  9.1   Hemoglobin 13.0 - 17.0 g/dL 11.8  12.9  13.9   Hematocrit 39.0 - 52.0 % 35.7  38.6  42.0   Platelets 150.0 - 400.0 K/uL 218.0  227  222.0     No results found for: "VD25OH"  Clinical ASCVD: No  The ASCVD Risk score (Arnett DK, et al., 2019) failed to calculate for the following reasons:   The valid total cholesterol range is 130 to 320 mg/dL       04/30/2022    9:29 AM 11/26/2021   10:39 AM 06/13/2021    8:37 AM  Depression screen PHQ 2/9  Decreased Interest 0 2 0  Down, Depressed, Hopeless 0 0 0  PHQ - 2 Score 0 2 0  Altered sleeping  0   Tired, decreased energy  1   Change in appetite  1   Feeling bad or failure about yourself   0   Trouble concentrating  0   Moving slowly or fidgety/restless  0   Suicidal thoughts  0   PHQ-9 Score  4   Difficult doing work/chores  Not difficult at all       Social History   Tobacco Use  Smoking Status Former   Packs/day: 2.00   Years: 20.00   Total pack years: 40.00   Types: Cigarettes   Quit date: 08/31/1981   Years since quitting: 40.9  Smokeless Tobacco Never   BP Readings from Last 3 Encounters:  06/30/22 100/60  06/12/22 126/84  05/06/22 112/68   Pulse Readings from Last 3 Encounters:  06/30/22 66  06/12/22 74  05/06/22 74   Wt Readings from Last 3 Encounters:  06/30/22 195 lb 6.4 oz (88.6 kg)  06/12/22 195 lb (88.5 kg)  05/06/22 204 lb 12.8 oz (92.9 kg)   BMI Readings from Last 3 Encounters:  06/30/22 27.25 kg/m  06/12/22 27.20 kg/m  05/06/22 28.56 kg/m    Assessment/Interventions: Review of patient past medical history, allergies, medications, health status, including review of consultants reports, laboratory and other test data, was performed as part of comprehensive  evaluation and provision of chronic care management services.   SDOH:  (Social Determinants of Health) assessments and interventions performed: Yes  (last 05/26/22) SDOH Interventions    Flowsheet Row Chronic Care Management from 05/26/2022 in [place] at [place] from 04/30/2022 in [place] at [place] from 04/29/2021 in [place] at [place] Management from 05/07/2020 in [place] at [place]  from 04/26/2020 in [place] at [place] Management from 03/18/2020 in [place] at [place] Interventions -- Intervention Not Indicated Intervention Not Indicated -- Intervention Not Indicated --  Housing Interventions -- Intervention Not Indicated Intervention Not Indicated -- Intervention Not Indicated --  Transportation Interventions -- Intervention Not Indicated Intervention Not Indicated Intervention Not Indicated Intervention Not Indicated --  Financial Strain Interventions _0  Other (Comment)  [Patient assistance forms mailed and faxed for Januvia and Jardiance]  Physical Activity Interventions -- Intervention Not Indicated Intervention Not Indicated -- Intervention Not Indicated --  Stress Interventions -- Intervention Not Indicated Intervention Not Indicated -- Intervention Not Indicated --  Social Connections Interventions -- Intervention Not Indicated Intervention Not Indicated -- Intervention Not Indicated --       CCM Care Plan  Allergies  Allergen Reactions   Lisinopril Cough   Erythromycin     REACTION: abd pain   Trulicity [Dulaglutide] Nausea Only    Medications Reviewed Today     Reviewed by [name], MD (Physician) on 06/30/22 at [place] List Status: <None>    Medication Order Taking? Sig Documenting Provider Last Dose Status Informant  aspirin 81 MG tablet [id number] Yes Take 1 tablet by mouth at bedtime.  [provider] Taking Active Self  glipiZIDE (GLUCOTROL) 5 MG tablet [id number] Yes Take 5 mg by mouth daily before breakfast. [provider]  Active   ipratropium (ATROVENT) 0.06 % nasal spray [id number] Yes Place 2 sprays each nostril up to 4 times a day for symptoms relief Kennith Gain, MD Taking Active   JARDIANCE 10 MG TABS tablet [id number] Yes TAKE ONE TABLET BY MOUTH EVERY MORNING Burchette, Alinda Sierras, MD Taking Active   Lancets (ONETOUCH DELICA PLUS [id number]) [place] [id number] Yes USE ONE TO CHECK GLUCOSE TWICE DAILY Burchette, Alinda Sierras, MD Taking Active   levocetirizine (XYZAL) 5 MG tablet [id number] Yes Take 1 tablet (5 mg total) by mouth every evening. Roney Marion, MD Taking Active   lisinopril (ZESTRIL) 10 MG tablet [id number] Yes TAKE ONE TABLET BY MOUTH EVERY EVENING Burchette, Alinda Sierras, MD Taking Active   metFORMIN (GLUCOPHAGE) 1000 MG tablet [id number] Yes Take 1 tablet (1,000 mg total) by mouth 2 (two) times daily with a meal. Burchette, Alinda Sierras, MD Taking Active   nitroGLYCERIN (NITROSTAT) 0.4 MG SL tablet [id number] Yes Take one tablet sublingual as needed for chest pain up to three doses as needed. [name], MD Taking Active   [facility] ULTRA test strip [id number] Yes USE ONE STRIP TWICE DAILY TO CHECK BLOOD SUGARS [name], MD Taking Active   pioglitazone (ACTOS) 45 MG tablet [id number] Yes TAKE ONE TABLET BY MOUTH EVERY MORNING Burchette, Alinda Sierras, MD Taking Active   rosuvastatin (CRESTOR) 20 MG tablet [id number] Yes TAKE ONE TABLET BY MOUTH EVERYDAY AT BEDTIME Jettie Booze, MD Taking Active   Semaglutide, 2 MG/DOSE, 8 MG/3ML SOPN [id number] Yes Inject 2 mg as directed once a week. [name], MD Taking Active   tadalafil (CIALIS) 20 MG tablet [id number] Yes TAKE ONE TABLET BY  MOUTH every other DAY AS NEEDED Burchette, Alinda Sierras, MD Taking Active   vitamin B-12 (CYANOCOBALAMIN) 1000 MCG tablet [id number] Yes Take 1,000 mcg by mouth at bedtime.  [provider] Taking Active Self  Patient Active Problem List   Diagnosis Date Noted   Erectile dysfunction 03/11/2020   CKD (chronic kidney disease) stage 3, GFR 30-59 ml/min (HCC) 11/06/2019   Laryngopharyngeal reflux (LPR) 06/12/2019   Perennial allergic rhinitis 06/12/2019   Chronic right shoulder pain 09/24/2016   Cough 08/19/2016   Hemoptysis 08/19/2016   Normocytic anemia 06/29/2016   Hypertension 12/25/2015   Obesity (BMI 30-39.9) 02/02/2014   Acute URI 10/10/2012   Overweight 04/05/2012   Venous insufficiency 04/02/2011   HEADACHE 02/25/2008   CHEST PAIN, ATYPICAL 02/25/2008   HYPERCHOLESTEROLEMIA 02/24/2008   Poorly controlled type 2 diabetes mellitus ([place]) 02/23/2008   ANXIETY 02/23/2008   GERD 02/23/2008   PSORIASIS 02/23/2008   DEGENERATIVE JOINT DISEASE 02/23/2008   COLONIC POLYPS, HX OF 02/23/2008    Immunization History  Administered Date(s) Administered   Fluad Quad(high Dose 65+) 05/09/2019, 06/11/2020, 06/30/2022   Influenza Split 10/05/2011   Influenza Whole 07/16/2009   Influenza, High Dose Seasonal PF 05/22/2015, 06/29/2016, 06/15/2017, 05/20/2018   Influenza,inj,Quad PF,6+ Mos 07/07/2013, 05/04/2014   PFIZER(Purple Top)SARS-COV-2 Vaccination 10/22/2019, 11/15/2019, 07/09/2020   Pneumococcal Conjugate-13 08/21/2014   Pneumococcal Polysaccharide-23 12/05/2012   Pneumococcal-Unspecified 10/11/2007   Td 03/09/2006   Tdap 03/25/2016   Zoster, Live 03/31/2016   Patient sampled with Dexcom G7 CGM supplies and presents today for education on use and initial placement.  I have reviewed proper use, including but not limited to: glucose direction and how to incorporate it in treatment decisions including meal insulin and exercise, evaluating previous evening trends  first thing in the morning, and downloading and attaching to email.  Sensor lag was explained. Oriented to the reader device and reminded not to discard Transmitter when the sensors are replaced every 10 days. Set lower BG alarm to 70 and upper BG alarm to 250.  No calibration and no finger sticks needed. 2 hour warm up and staying within 20 feet of transmitter reviewed. Advised not to administer insulin within 3 inches of sensor.  Procedure and information discussed with patient. Patient has no further questions at this time. Verbalizes understanding and agrees to have CGM placed.  right arm cleaned and prepared as instructed by manufacturers instructions. Dexcom G7 CGM placed as instructed by manufacturers instructions. No redness, swelling, bleeding, or bruising noted. Sensor activated and monitoring at this time. Patient is aware to resume normal activities including bathing, getting dressed, and exercise.   Plan to follow up in 10-20 days with pharmacist or as instructed by provider.   CGM report:    -what has he been eating? Did he complete a food diary?  -need more sensors - will do 2 more for adjusting diet and food diary  Conditions to be addressed/monitored:  Hypertension, Hyperlipidemia, Diabetes, Chronic Kidney Disease and ED  Conditions addressed this visit: Diabetes, CKD  There are no care plans that you recently modified to display for this patient.     Medication Assistance:  Ozempic obtained through [employer] medication assistance program.  Enrollment ends 08/30/22    Compliance/Adherence/Medication fill history: Care Gaps: Shingrix, COVID booster, Prevnar 20, eye exam, colonoscopy BP- 100/60 06/30/22  A1c: 7.3% (06/30/22)  Star-Rating Drugs: Jardiance 10 mg - Last filed 05/19/22 30 DS at [facility] - Last filed 06/22/22 30 [facility] at [facility] Rosuvastatin 20 mg - Last filled 06/22/22 30 DS at [facility] Lisinopril 10 mg- Last filled 06/22/22 30 DS at  [facility] Pioglitazone 45 mg - Last filled 06/22/22 30 DS at [facility] Metformin 500 mg - Last filled 06/22/22  30 DS at [facility] Ozempic 2 mg - obtained through PAP  Patient's preferred pharmacy is:  [facility] - [place], [place] - [address] [place] [address] Phone: [phone] Fax: [place], [place] [address] [place] [address] Phone: [phone] Fax: [phone]   Uses pill box? No - adherence packaging Pt endorses 100% compliance  We discussed: Benefits of medication synchronization, packaging and delivery as well as enhanced pharmacist oversight with [facility]. Patient decided to: Utilize [facility] pharmacy for medication synchronization, packaging and delivery  Care Plan and Follow Up Patient Decision:  Patient agrees to Care Plan and Follow-up.  Plan: Telephone follow up appointment with care management team member scheduled for:  2-3 weeks  [name], PharmD [facility] Clinical Pharmacist [place] at [place] [phone]

## 2022-07-21 NOTE — Telephone Encounter (Signed)
  Chronic Care Management   Outreach Note  07/21/2022 Name: SHNEUR WHITTENBURG MRN: [id number] DOB: 26-Nov-1945  Referred by: [name], MD  Patient had a phone appointment scheduled with clinical pharmacist today.  An unsuccessful telephone outreach was attempted today. The patient was referred to the pharmacist for assistance with care management and care coordination.   If possible, a message was left to return call to: [phone] or to [facility] at [facility]: [place], PharmD, [place] Pharmacist [place] at [place]

## 2022-07-26 NOTE — Progress Notes (Signed)
Chronic Care Management Pharmacy Note  07/27/2022 Name:  Lucas Warren MRN:  [id number] DOB:  1946-04-07  Summary: A1c not at goal < 7%  CGM report demonstrated significantly elevated post prandial blood sugars  Recommendations/Changes made from today's visit: -Recommended increasing protein intake with meals to avoid blood sugar spikes -Recommended food diary for the next week  Plan: Follow up in 1 week on Dexcom report and food diary   Subjective: Lucas Warren is an 76 y.o. year old male who is a primary patient of Burchette, Alinda Sierras, MD.  The CCM team was consulted for assistance with disease management and care coordination needs.    Engaged with patient by telephone for follow up visit in response to provider referral for pharmacy case management and/or care coordination services.   Consent to Services:  The patient was given information about Chronic Care Management services, agreed to services, and gave verbal consent prior to initiation of services.  Please see initial visit note for detailed documentation.   Patient Care Team: [name], MD as PCP - General (Family Medicine) Jettie Booze, MD as PCP - Cardiology (Cardiology) Viona Gilmore, [facility] as Pharmacist (Pharmacist)  Recent office visits: 06/30/22 [name], MD - Patient presented for Poorly Controlled type 2 diabetes mellitus and other concerns. No medication changes.Stopped Glimepiride. Prescribed Glipizide   06/12/22 [name], MD: Patient presented for video visit due to COVID 19 infection. Prescribed molnupiravir.   04/30/22 Rolene Arbour, LPN: Patient presented for AWV.  03/16/22 Burchette, Alinda Sierras, MD - Patient presented for Poorly controlled type 2 diabetes and other concerns. Stopped Ipratropium Bromide.  Recent consult visits: 05/29/2022 Roney Marion MD (allergy) - Patient was seen for other allergic rhinitis. Started Xyzal 5 mg. Follow up with Dr. Nelva Bush in  4-6 weeks or sooner if you want to start allergy injections.  05/06/22 Kennith Gain, MD (Allergy) - Patient presented for Chronic rhinitis and other concerns. Prescribed Ipratropium Bromide 0.06 %.   03/04/22 Jettie Booze, MD (Cardiology) - Patient presented for CAD involving native coronary artery of native heart with other form of angina pectoris and other concerns. No medication changes.  Hospital visits: None in previous 6 months  Objective:  Lab Results  Component Value Date   CREATININE 1.61 (H) 11/26/2021   BUN 22 11/26/2021   GFR 42.05 (L) 11/26/2021   GFRNONAA 42 (L) 06/11/2020   GFRAA 48 (L) 06/11/2020   NA 139 11/26/2021   K 4.0 11/26/2021   CALCIUM 10.0 11/26/2021   CO2 22 11/26/2021   GLUCOSE 241 (H) 11/26/2021    Lab Results  Component Value Date/Time   HGBA1C 7.3 (A) 06/30/2022 10:46 AM   HGBA1C 7.8 (A) 03/16/2022 08:22 AM   HGBA1C 8.3 (H) 12/25/2015 08:39 AM   HGBA1C 8.4 10/14/2015 12:00 AM   HGBA1C 7.3 (H) 05/22/2015 09:53 AM   GFR 42.05 (L) 11/26/2021 11:14 AM   GFR 46.97 (L) 09/15/2021 08:57 AM   MICROALBUR 12.1 (H) 06/30/2022 11:24 AM   MICROALBUR 2.5 06/11/2020 08:23 AM    Last diabetic Eye exam:  Lab Results  Component Value Date/Time   HMDIABEYEEXA No Retinopathy 04/02/2020 12:00 AM    Last diabetic Foot exam:  Lab Results  Component Value Date/Time   HMDIABFOOTEX normal 03/25/2016 12:00 AM     Lab Results  Component Value Date   CHOL 109 09/15/2021   HDL 50.50 09/15/2021   LDLCALC 37 09/15/2021   TRIG 109.0 09/15/2021  CHOLHDL 2 09/15/2021       Latest Ref Rng & Units 11/26/2021   11:14 AM 09/15/2021    8:57 AM 06/11/2020    8:31 AM  Hepatic Function  Total Protein 6.0 - 8.3 g/dL 7.2  7.1  6.7   Albumin 3.5 - 5.2 g/dL 4.5  4.3    AST 0 - 37 U/L _0 ALT 0 - 53 U/L _1 Alk Phosphatase 39 - 117 U/L 44  39    Total Bilirubin 0.2 - 1.2 mg/dL 0.4  0.4  0.4   Bilirubin, Direct 0.0 - 0.3 mg/dL  0.1       Lab Results  Component Value Date/Time   TSH 0.71 11/26/2021 11:14 AM   TSH 0.86 07/24/2015 12:35 PM       Latest Ref Rng & Units 11/26/2021   11:14 AM 05/15/2020   10:45 AM 11/11/2018   12:56 PM  CBC  WBC 4.0 - 10.5 K/uL 7.4  8.3  9.1   Hemoglobin 13.0 - 17.0 g/dL 11.8  12.9  13.9   Hematocrit 39.0 - 52.0 % 35.7  38.6  42.0   Platelets 150.0 - 400.0 K/uL 218.0  227  222.0     No results found for: "VD25OH"  Clinical ASCVD: No  The ASCVD Risk score (Arnett DK, et al., 2019) failed to calculate for the following reasons:   The valid total cholesterol range is 130 to 320 mg/dL       04/30/2022    9:29 AM 11/26/2021   10:39 AM 06/13/2021    8:37 AM  Depression screen PHQ 2/9  Decreased Interest 0 2 0  Down, Depressed, Hopeless 0 0 0  PHQ - 2 Score 0 2 0  Altered sleeping  0   Tired, decreased energy  1   Change in appetite  1   Feeling bad or failure about yourself   0   Trouble concentrating  0   Moving slowly or fidgety/restless  0   Suicidal thoughts  0   PHQ-9 Score  4   Difficult doing work/chores  Not difficult at all       Social History   Tobacco Use  Smoking Status Former   Packs/day: 2.00   Years: 20.00   Total pack years: 40.00   Types: Cigarettes   Quit date: 08/31/1981   Years since quitting: 40.9  Smokeless Tobacco Never   BP Readings from Last 3 Encounters:  06/30/22 100/60  06/12/22 126/84  05/06/22 112/68   Pulse Readings from Last 3 Encounters:  06/30/22 66  06/12/22 74  05/06/22 74   Wt Readings from Last 3 Encounters:  06/30/22 195 lb 6.4 oz (88.6 kg)  06/12/22 195 lb (88.5 kg)  05/06/22 204 lb 12.8 oz (92.9 kg)   BMI Readings from Last 3 Encounters:  06/30/22 27.25 kg/m  06/12/22 27.20 kg/m  05/06/22 28.56 kg/m    Assessment/Interventions: Review of patient past medical history, allergies, medications, health status, including review of consultants reports, laboratory and other test data, was performed as part of  comprehensive evaluation and provision of chronic care management services.   SDOH:  (Social Determinants of Health) assessments and interventions performed: Yes  (last 05/26/22) SDOH Interventions    Flowsheet Row Chronic Care Management from 05/26/2022 in [place] at [place] from 04/30/2022 in [place] at [place] from 04/29/2021 in [place] at [place] Management  from 05/07/2020 in [place] at [place] from 04/26/2020 in [place] at [place] Management from 03/18/2020 in [place] at [place] Interventions -- Intervention Not Indicated Intervention Not Indicated -- Intervention Not Indicated --  Housing Interventions -- Intervention Not Indicated Intervention Not Indicated -- Intervention Not Indicated --  Transportation Interventions -- Intervention Not Indicated Intervention Not Indicated Intervention Not Indicated Intervention Not Indicated --  Financial Strain Interventions _0  Other (Comment)  [Patient assistance forms mailed and faxed for Januvia and Jardiance]  Physical Activity Interventions -- Intervention Not Indicated Intervention Not Indicated -- Intervention Not Indicated --  Stress Interventions -- Intervention Not Indicated Intervention Not Indicated -- Intervention Not Indicated --  Social Connections Interventions -- Intervention Not Indicated Intervention Not Indicated -- Intervention Not Indicated --       CCM Care Plan  Allergies  Allergen Reactions   Lisinopril Cough   Erythromycin     REACTION: abd pain   Trulicity [Dulaglutide] Nausea Only    Medications Reviewed Today     Reviewed by [name], MD (Physician) on 06/30/22 at [facility]  Status: <None>   Medication Order Taking? Sig Documenting Provider Last Dose Status Informant  aspirin 81 MG tablet [id number] Yes Take 1 tablet by mouth at bedtime.  [provider] Taking Active Self  glipiZIDE (GLUCOTROL) 5 MG tablet [id number] Yes Take 5 mg by mouth daily before breakfast. [provider]  Active   ipratropium (ATROVENT) 0.06 % nasal spray [id number] Yes Place 2 sprays each nostril up to 4 times a day for symptoms relief Kennith Gain, MD Taking Active   JARDIANCE 10 MG TABS tablet [id number] Yes TAKE ONE TABLET BY MOUTH EVERY MORNING Burchette, Alinda Sierras, MD Taking Active   Lancets (ONETOUCH DELICA PLUS [id number]) [place] [id number] Yes USE ONE TO CHECK GLUCOSE TWICE DAILY Burchette, Alinda Sierras, MD Taking Active   levocetirizine (XYZAL) 5 MG tablet [id number] Yes Take 1 tablet (5 mg total) by mouth every evening. Roney Marion, MD Taking Active   lisinopril (ZESTRIL) 10 MG tablet [id number] Yes TAKE ONE TABLET BY MOUTH EVERY EVENING Burchette, Alinda Sierras, MD Taking Active   metFORMIN (GLUCOPHAGE) 1000 MG tablet [id number] Yes Take 1 tablet (1,000 mg total) by mouth 2 (two) times daily with a meal. Burchette, Alinda Sierras, MD Taking Active   nitroGLYCERIN (NITROSTAT) 0.4 MG SL tablet [id number] Yes Take one tablet sublingual as needed for chest pain up to three doses as needed. [name], MD Taking Active   [facility] ULTRA test strip [id number] Yes USE ONE STRIP TWICE DAILY TO CHECK BLOOD SUGARS [name], MD Taking Active   pioglitazone (ACTOS) 45 MG tablet [id number] Yes TAKE ONE TABLET BY MOUTH EVERY MORNING Burchette, Alinda Sierras, MD Taking Active   rosuvastatin (CRESTOR) 20 MG tablet [id number] Yes TAKE ONE TABLET BY MOUTH EVERYDAY AT BEDTIME Jettie Booze, MD Taking Active   Semaglutide, 2 MG/DOSE, 8 MG/3ML SOPN [id number] Yes Inject 2 mg as directed once a week. [name], MD Taking Active   tadalafil (CIALIS) 20 MG tablet [id number] Yes TAKE  ONE TABLET BY MOUTH every other DAY AS NEEDED Burchette, Alinda Sierras, MD Taking Active   vitamin B-12 (CYANOCOBALAMIN) 1000 MCG tablet [id number] Yes Take 1,000 mcg by mouth at bedtime.  [provider] Taking Active  Self            Patient Active Problem List   Diagnosis Date Noted   Erectile dysfunction 03/11/2020   CKD (chronic kidney disease) stage 3, GFR 30-59 ml/min (HCC) 11/06/2019   Laryngopharyngeal reflux (LPR) 06/12/2019   Perennial allergic rhinitis 06/12/2019   Chronic right shoulder pain 09/24/2016   Cough 08/19/2016   Hemoptysis 08/19/2016   Normocytic anemia 06/29/2016   Hypertension 12/25/2015   Obesity (BMI 30-39.9) 02/02/2014   Acute URI 10/10/2012   Overweight 04/05/2012   Venous insufficiency 04/02/2011   HEADACHE 02/25/2008   CHEST PAIN, ATYPICAL 02/25/2008   HYPERCHOLESTEROLEMIA 02/24/2008   Poorly controlled type 2 diabetes mellitus ([place]) 02/23/2008   ANXIETY 02/23/2008   GERD 02/23/2008   PSORIASIS 02/23/2008   DEGENERATIVE JOINT DISEASE 02/23/2008   COLONIC POLYPS, HX OF 02/23/2008    Immunization History  Administered Date(s) Administered   Fluad Quad(high Dose 65+) 05/09/2019, 06/11/2020, 06/30/2022   Influenza Split 10/05/2011   Influenza Whole 07/16/2009   Influenza, High Dose Seasonal PF 05/22/2015, 06/29/2016, 06/15/2017, 05/20/2018   Influenza,inj,Quad PF,6+ Mos 07/07/2013, 05/04/2014   PFIZER(Purple Top)SARS-COV-2 Vaccination 10/22/2019, 11/15/2019, 07/09/2020   Pneumococcal Conjugate-13 08/21/2014   Pneumococcal Polysaccharide-23 12/05/2012   Pneumococcal-Unspecified 10/11/2007   Td 03/09/2006   Tdap 03/25/2016   Zoster, Live 03/31/2016    CGM report:   He had one episode of a low when he first put on the sensor first thing in the morning.   He just had a blueberry muffin for breakfast. He did not do a just a blueberry muffin and 2 cups of coffee with coffee mate.   He does have gatorade and Dr. Malachi Bonds and drinks them  during the day. But he mostly drinks just a swig or two. Suggested adding flavor packets to water instead of soda or gatorade due to sugar content.  Conditions to be addressed/monitored:  Hypertension, Hyperlipidemia, Diabetes, Chronic Kidney Disease and ED  Conditions addressed this visit: Diabetes, CKD  Care Plan : CCM Pharmacy Care Plan  Updates made by Viona Gilmore, [place] since 07/27/2022 12:00 AM     Problem: Problem: Hypertension, Hyperlipidemia, Diabetes, Chronic Kidney Disease and ED      Long-Range Goal: Patient-Specific Goal   Start Date: 11/20/2020  Expected End Date: 11/20/2021  Recent Progress: On track  Priority: High  Note:   Current Barriers:  Unable to independently monitor therapeutic efficacy Unable to achieve control of diabetes   Pharmacist Clinical Goal(s):  Patient will achieve adherence to monitoring guidelines and medication adherence to achieve therapeutic efficacy achieve control of diabetes as evidenced by A1c  through collaboration with PharmD and provider.   Interventions: 1:1 collaboration with [name], MD regarding development and update of comprehensive plan of care as evidenced by provider attestation and co-signature Inter-disciplinary care team collaboration (see longitudinal plan of care) Comprehensive medication review performed; medication list updated in electronic medical record  Hypertension (BP goal <140/90) -Controlled -Current treatment: Lisinopril 32m, 1 tablet once daily - Appropriate, Effective, Safe, Accessible -Medications previously tried: isosorbide  -Current home readings: not checking regularly -Current dietary habits: did not discuss  -Current exercise habits: did not discuss -Denies hypotensive/hypertensive symptoms -Educated on Exercise goal of 150 minutes per week; Importance of home blood pressure monitoring; -Counseled to monitor BP at home weekly, document, and provide log at future  appointments -Counseled on diet and exercise extensively Recommended to continue current medication  Hyperlipidemia: (LDL goal < 55) -Controlled -Current treatment: Rosuvastatin 246m 1 tablet  once daily - Appropriate, Effective, Safe, Accessible -Medications previously tried: rosuvastatin (elevated BGs), simvastatin (myalgias)  -Current dietary patterns: did not discuss -Current exercise habits: did not discuss -Educated on Cholesterol goals;  Importance of limiting foods high in cholesterol; Exercise goal of 150 minutes per week; -Recommended to continue current medication  Diabetes (A1c goal <7%) -Uncontrolled -Current medications: Glipizide 5 mg 1 tablet once daily with breakfast - Appropriate, Query effective, Safe, Accessible Empagliflozin (Jardiance) 61m, 1 tablet once daily - Appropriate, Query effective, Safe, Accessible Metformin 5082m 1 tablets twice daily - Appropriate, Query effective, Safe, Accessible Pioglitazone (Actos) 4545m1 tablet once daily -  Appropriate, Query effective, Safe, Accessible Ozempic, inject 2 mg once weekly - Appropriate, Query effective, Safe, Accessible -Medications previously tried: Onglyza (cost), Januvia (switch to GLP1), Trulicity (nausea)  -Current home glucose readings fasting glucose: refer to CGM report post prandial glucose: not checking -Denies hypoglycemic/hyperglycemic symptoms -Current meal patterns:  breakfast: jimmy dean sausage biscuits x 2, blueberry muffin lunch: sandwich, beans, franks  dinner: cheeseburger; apple pie; frozen stuff peppers; [place] mac n'cheese and apples snacks: ice cream sandwiches, klondike bar drinks: n/a -Current exercise: little walking; plans to do more with cooler weather -Educated on Exercise goal of 150 minutes per week; Benefits of routine self-monitoring of blood sugar; Carbohydrate counting and/or plate method -Counseled to check feet daily and get yearly eye exams -Counseled on diet and  exercise extensively Recommended to continue current medication Recommended checking blood sugars 1-2 hours after meals.  CKD (Goal: improve kidney function) -Controlled -Current treatment  Jardiance 10 mg 1 tablet daily - Appropriate, Effective, Safe, Accessible -Medications previously tried: none  -Recommended to continue current medication  CAD (Goal: prevent heart attacks and strokes) -Controlled -Current treatment  Aspirin 81 mg 1 tablet daily - Appropriate, Effective, Safe, Accessible -Medications previously tried: none  -Counseled on monitoring for bleeding and bruising  ED (Goal: minimize symptoms) -Controlled -Current treatment  Cialis 5 mg 1 tablet as needed - Appropriate, Effective, Safe, Accessible -Medications previously tried: sildenafil (no longer effective)  -Recommended to continue current medication  Health Maintenance -Vaccine gaps: shingrix, COVID booster -Current therapy:  Vitamin B12 1000 mcg 1 tablet daily -Educated on Cost vs benefit of each product must be carefully weighed by individual consumer -Patient is satisfied with current therapy and denies issues -Recommended to continue current medication  Patient Goals/Self-Care Activities Patient will:  - take medications as prescribed check glucose daily, document, and provide at future appointments check blood pressure weekly, document, and provide at future appointments target a minimum of 150 minutes of moderate intensity exercise weekly engage in dietary modifications by eating more non-starchy vegetables than carbs  Follow Up Plan: Telephone follow up appointment with care management team member scheduled for: 1 week      Medication Assistance:  Ozempic obtained through [employer] medication assistance program.  Enrollment ends 08/30/22    Compliance/Adherence/Medication fill history: Care Gaps: Shingrix, COVID booster, Prevnar 20, eye exam, colonoscopy BP- 100/60 06/30/22  A1c: 7.3%  (06/30/22)  Star-Rating Drugs: Jardiance 10 mg - Last filed 06/22/22 30 DS at [facility] Rosuvastatin 20 mg - Last filled 06/22/22 30 DS at [facility] Lisinopril 10 mg- Last filled 06/22/22 30 DS at [facility] Pioglitazone 45 mg - Last filled 06/22/22 30 DS at [facility] Metformin 500 mg - Last filled 06/22/22 30 DS at [facility] Ozempic 2 mg - obtained through PAP  [facility]'s [facility] pharmacy [facility] [place] [place] [address] Phone: [phone] Fax: [place], [place] [address] [place] [address] Phone: [phone] Fax: [phone]   Uses pill box? No - adherence packaging Pt endorses 100% compliance  We discussed: Benefits of medication synchronization, packaging and delivery as well as enhanced pharmacist oversight with [facility]. Patient decided to: Utilize [facility] pharmacy for medication synchronization, packaging and delivery  Care Plan and Follow Up Patient Decision:  Patient agrees to Care Plan and Follow-up.  Plan: Telephone follow up appointment with care management team member scheduled for:  1 week  [name], PharmD [place] Pharmacist [place] at [place] [phone]

## 2022-07-27 ENCOUNTER — Ambulatory Visit (INDEPENDENT_AMBULATORY_CARE_PROVIDER_SITE_OTHER): Payer: Medicare Other | Admitting: Pharmacist

## 2022-07-27 DIAGNOSIS — I10 Essential (primary) hypertension: Secondary | ICD-10-CM

## 2022-07-27 DIAGNOSIS — E1165 Type 2 diabetes mellitus with hyperglycemia: Secondary | ICD-10-CM

## 2022-07-27 NOTE — Patient Instructions (Signed)
Hi [place],  It was great to catch up again! Please make sure to keep a food diary over the next week and we will check back in next week.  Please reach out to me if you have any questions or need anything before our follow up!  Best, Maddie  [name], PharmD, [place] Pharmacist [place] at [place]

## 2022-07-30 DIAGNOSIS — I10 Essential (primary) hypertension: Secondary | ICD-10-CM

## 2022-07-30 DIAGNOSIS — E1165 Type 2 diabetes mellitus with hyperglycemia: Secondary | ICD-10-CM

## 2022-08-03 ENCOUNTER — Telehealth: Payer: Self-pay | Admitting: Pharmacist

## 2022-08-03 NOTE — Chronic Care Management (AMB) (Signed)
    Chronic Care Management Pharmacy Assistant   Name: Lucas Warren  MRN: [id number] DOB: 1946-01-30  08/03/22 APPOINTMENT REMINDER   Patient was reminded to have all medications, supplements and any blood glucose and blood pressure readings available for review with [name], Pharm. D, for telephone visit on 08/04/22 at 9.    Care Gaps: COVID Booster - Overdue Eye Exam - Overdue Colonoscopy - Overdue Zoster vaccine - Postponed PNA Vaccine - Postponed BP- 100/60 06/30/22 AWV- 04/30/22 Lab Results  Component Value Date   HGBA1C 7.3 (A) 06/30/2022    Star Rating Drug: Jardiance 10 mg - Last filed 07/20/22 30 DS at [facility] Rosuvastatin 20 mg - Last filled 07/20/22 30 DS at [facility] Lisinopril 10 mg- Last filled 07/20/22 30 DS at [facility] Pioglitazone 45 mg - Last filled 07/20/22 30 DS at [facility] Metformin 1000 mg - Last filled 07/20/22 30 DS at [facility] Ozempic 2 mg - obtained through PAP     Medications: Outpatient Encounter Medications as of 08/03/2022  Medication Sig   aspirin 81 MG tablet Take 1 tablet by mouth at bedtime.    glipiZIDE (GLUCOTROL) 5 MG tablet Take 1 tablet (5 mg total) by mouth daily before breakfast.   ipratropium (ATROVENT) 0.06 % nasal spray Place 2 sprays each nostril up to 4 times a day for symptoms relief   JARDIANCE 10 MG TABS tablet TAKE ONE TABLET BY MOUTH EVERY MORNING   Lancets (ONETOUCH DELICA PLUS [id number]) MISC USE ONE TO CHECK GLUCOSE TWICE DAILY   levocetirizine (XYZAL) 5 MG tablet Take 1 tablet (5 mg total) by mouth every evening.   lisinopril (ZESTRIL) 10 MG tablet TAKE ONE TABLET BY MOUTH EVERY EVENING   metFORMIN (GLUCOPHAGE) 1000 MG tablet Take 1 tablet (1,000 mg total) by mouth 2 (two) times daily with a meal.   nitroGLYCERIN (NITROSTAT) 0.4 MG SL tablet Take one tablet sublingual as needed for chest pain up to three doses as needed.   ONETOUCH ULTRA test strip USE ONE STRIP TWICE DAILY TO CHECK BLOOD SUGARS    pioglitazone (ACTOS) 45 MG tablet TAKE ONE TABLET BY MOUTH EVERY MORNING   rosuvastatin (CRESTOR) 20 MG tablet TAKE ONE TABLET BY MOUTH EVERYDAY AT BEDTIME   Semaglutide, 2 MG/DOSE, 8 MG/3ML SOPN Inject 2 mg as directed once a week.   tadalafil (CIALIS) 20 MG tablet TAKE ONE TABLET BY MOUTH every other DAY AS NEEDED   vitamin B-12 (CYANOCOBALAMIN) 1000 MCG tablet Take 1,000 mcg by mouth at bedtime.    No facility-administered encounter medications on file as of 08/03/2022.      [place] Clinical Pharmacist Assistant [phone]

## 2022-08-03 NOTE — Progress Notes (Unsigned)
Chronic Care Management Pharmacy Note  08/04/2022 Name:  Lucas Warren MRN:  [id number] DOB:  11-18-1945  Summary: A1c not at goal < 7%  CGM report improved from last week CGM report demonstrated elevated post prandial blood sugars  Recommendations/Changes made from today's visit: -Recommended increasing protein intake with meals (especially breakfast) to avoid blood sugar spikes -Recommended limiting multiple carbs with meals  Plan: Follow up after PCP appt on repeat A1c   Subjective: Lucas Warren is an 76 y.o. year old male who is a primary patient of Burchette, Alinda Sierras, MD.  The CCM team was consulted for assistance with disease management and care coordination needs.    Engaged with patient by telephone for follow up visit in response to provider referral for pharmacy case management and/or care coordination services.   Consent to Services:  The patient was given information about Chronic Care Management services, agreed to services, and gave verbal consent prior to initiation of services.  Please see initial visit note for detailed documentation.   Patient Care Team: [name], MD as PCP - General (Family Medicine) Jettie Booze, MD as PCP - Cardiology (Cardiology) Viona Gilmore, [facility] as Pharmacist (Pharmacist)  Recent office visits: 06/30/22 [name], MD - Patient presented for Poorly Controlled type 2 diabetes mellitus and other concerns. No medication changes.Stopped Glimepiride. Prescribed Glipizide   06/12/22 [name], MD: Patient presented for video visit due to COVID 19 infection. Prescribed molnupiravir.   04/30/22 Rolene Arbour, LPN: Patient presented for AWV.  03/16/22 Burchette, Alinda Sierras, MD - Patient presented for Poorly controlled type 2 diabetes and other concerns. Stopped Ipratropium Bromide.  Recent consult visits: 05/29/2022 Roney Marion MD (allergy) - Patient was seen for other allergic rhinitis. Started Xyzal  5 mg. Follow up with Dr. Nelva Bush in 4-6 weeks or sooner if you want to start allergy injections.  05/06/22 Kennith Gain, MD (Allergy) - Patient presented for Chronic rhinitis and other concerns. Prescribed Ipratropium Bromide 0.06 %.   03/04/22 Jettie Booze, MD (Cardiology) - Patient presented for CAD involving native coronary artery of native heart with other form of angina pectoris and other concerns. No medication changes.  Hospital visits: None in previous 6 months  Objective:  Lab Results  Component Value Date   CREATININE 1.61 (H) 11/26/2021   BUN 22 11/26/2021   GFR 42.05 (L) 11/26/2021   GFRNONAA 42 (L) 06/11/2020   GFRAA 48 (L) 06/11/2020   NA 139 11/26/2021   K 4.0 11/26/2021   CALCIUM 10.0 11/26/2021   CO2 22 11/26/2021   GLUCOSE 241 (H) 11/26/2021    Lab Results  Component Value Date/Time   HGBA1C 7.3 (A) 06/30/2022 10:46 AM   HGBA1C 7.8 (A) 03/16/2022 08:22 AM   HGBA1C 8.3 (H) 12/25/2015 08:39 AM   HGBA1C 8.4 10/14/2015 12:00 AM   HGBA1C 7.3 (H) 05/22/2015 09:53 AM   GFR 42.05 (L) 11/26/2021 11:14 AM   GFR 46.97 (L) 09/15/2021 08:57 AM   MICROALBUR 12.1 (H) 06/30/2022 11:24 AM   MICROALBUR 2.5 06/11/2020 08:23 AM    Last diabetic Eye exam:  Lab Results  Component Value Date/Time   HMDIABEYEEXA No Retinopathy 04/02/2020 12:00 AM    Last diabetic Foot exam:  Lab Results  Component Value Date/Time   HMDIABFOOTEX normal 03/25/2016 12:00 AM     Lab Results  Component Value Date   CHOL 109 09/15/2021   HDL 50.50 09/15/2021   LDLCALC 37 09/15/2021   TRIG 109.0  09/15/2021   CHOLHDL 2 09/15/2021       Latest Ref Rng & Units 11/26/2021   11:14 AM 09/15/2021    8:57 AM 06/11/2020    8:31 AM  Hepatic Function  Total Protein 6.0 - 8.3 g/dL 7.2  7.1  6.7   Albumin 3.5 - 5.2 g/dL 4.5  4.3    AST 0 - 37 U/L _0 ALT 0 - 53 U/L _1 Alk Phosphatase 39 - 117 U/L 44  39    Total Bilirubin 0.2 - 1.2 mg/dL 0.4  0.4  0.4    Bilirubin, Direct 0.0 - 0.3 mg/dL  0.1      Lab Results  Component Value Date/Time   TSH 0.71 11/26/2021 11:14 AM   TSH 0.86 07/24/2015 12:35 PM       Latest Ref Rng & Units 11/26/2021   11:14 AM 05/15/2020   10:45 AM 11/11/2018   12:56 PM  CBC  WBC 4.0 - 10.5 K/uL 7.4  8.3  9.1   Hemoglobin 13.0 - 17.0 g/dL 11.8  12.9  13.9   Hematocrit 39.0 - 52.0 % 35.7  38.6  42.0   Platelets 150.0 - 400.0 K/uL 218.0  227  222.0     No results found for: "VD25OH"  Clinical ASCVD: No  The ASCVD Risk score (Arnett DK, et al., 2019) failed to calculate for the following reasons:   The valid total cholesterol range is 130 to 320 mg/dL       04/30/2022    9:29 AM 11/26/2021   10:39 AM 06/13/2021    8:37 AM  Depression screen PHQ 2/9  Decreased Interest 0 2 0  Down, Depressed, Hopeless 0 0 0  PHQ - 2 Score 0 2 0  Altered sleeping  0   Tired, decreased energy  1   Change in appetite  1   Feeling bad or failure about yourself   0   Trouble concentrating  0   Moving slowly or fidgety/restless  0   Suicidal thoughts  0   PHQ-9 Score  4   Difficult doing work/chores  Not difficult at all       Social History   Tobacco Use  Smoking Status Former   Packs/day: 2.00   Years: 20.00   Total pack years: 40.00   Types: Cigarettes   Quit date: 08/31/1981   Years since quitting: 40.9  Smokeless Tobacco Never   BP Readings from Last 3 Encounters:  06/30/22 100/60  06/12/22 126/84  05/06/22 112/68   Pulse Readings from Last 3 Encounters:  06/30/22 66  06/12/22 74  05/06/22 74   Wt Readings from Last 3 Encounters:  06/30/22 195 lb 6.4 oz (88.6 kg)  06/12/22 195 lb (88.5 kg)  05/06/22 204 lb 12.8 oz (92.9 kg)   BMI Readings from Last 3 Encounters:  06/30/22 27.25 kg/m  06/12/22 27.20 kg/m  05/06/22 28.56 kg/m    Assessment/Interventions: Review of patient past medical history, allergies, medications, health status, including review of consultants reports, laboratory and other  test data, was performed as part of comprehensive evaluation and provision of chronic care management services.   SDOH:  (Social Determinants of Health) assessments and interventions performed: Yes  (last 05/26/22) SDOH Interventions    Flowsheet Row Chronic Care Management from 05/26/2022 in [place] at [place] from 04/30/2022 in [place] at [place] from 04/29/2021 in [place] at [place]  Chronic Care Management from 05/07/2020 in [place] at [place] from 04/26/2020 in [place] at [place] Management from 03/18/2020 in [place] at [employer] Interventions -- Intervention Not Indicated Intervention Not Indicated -- Intervention Not Indicated --  Housing Interventions -- Intervention Not Indicated Intervention Not Indicated -- Intervention Not Indicated --  Transportation Interventions -- Intervention Not Indicated Intervention Not Indicated Intervention Not Indicated Intervention Not Indicated --  Financial Strain Interventions _0  Other (Comment)  [Patient assistance forms mailed and faxed for Januvia and Jardiance]  Physical Activity Interventions -- Intervention Not Indicated Intervention Not Indicated -- Intervention Not Indicated --  Stress Interventions -- Intervention Not Indicated Intervention Not Indicated -- Intervention Not Indicated --  Social Connections Interventions -- Intervention Not Indicated Intervention Not Indicated -- Intervention Not Indicated --       CCM Care Plan  Allergies  Allergen Reactions   Lisinopril Cough   Erythromycin     REACTION: abd pain   Trulicity [Dulaglutide] Nausea Only    Medications Reviewed Today     Reviewed by [name], MD  (Physician) on 06/30/22 at [place] List Status: <None>   Medication Order Taking? Sig Documenting Provider Last Dose Status Informant  aspirin 81 MG tablet [id number] Yes Take 1 tablet by mouth at bedtime.  [provider] Taking Active Self  glipiZIDE (GLUCOTROL) 5 MG tablet [id number] Yes Take 5 mg by mouth daily before breakfast. [provider]  Active   ipratropium (ATROVENT) 0.06 % nasal spray [id number] Yes Place 2 sprays each nostril up to 4 times a day for symptoms relief Kennith Gain, MD Taking Active   JARDIANCE 10 MG TABS tablet [id number] Yes TAKE ONE TABLET BY MOUTH EVERY MORNING Burchette, Alinda Sierras, MD Taking Active   Lancets (ONETOUCH DELICA PLUS [id number]) [place] [id number] Yes USE ONE TO CHECK GLUCOSE TWICE DAILY Burchette, Alinda Sierras, MD Taking Active   levocetirizine (XYZAL) 5 MG tablet [id number] Yes Take 1 tablet (5 mg total) by mouth every evening. Roney Marion, MD Taking Active   lisinopril (ZESTRIL) 10 MG tablet [id number] Yes TAKE ONE TABLET BY MOUTH EVERY EVENING Burchette, Alinda Sierras, MD Taking Active   metFORMIN (GLUCOPHAGE) 1000 MG tablet [id number] Yes Take 1 tablet (1,000 mg total) by mouth 2 (two) times daily with a meal. Burchette, Alinda Sierras, MD Taking Active   nitroGLYCERIN (NITROSTAT) 0.4 MG SL tablet [id number] Yes Take one tablet sublingual as needed for chest pain up to three doses as needed. [name], MD Taking Active   [facility] ULTRA test strip [id number] Yes USE ONE STRIP TWICE DAILY TO CHECK BLOOD SUGARS [name], MD Taking Active   pioglitazone (ACTOS) 45 MG tablet [id number] Yes TAKE ONE TABLET BY MOUTH EVERY MORNING Burchette, Alinda Sierras, MD Taking Active   rosuvastatin (CRESTOR) 20 MG tablet [id number] Yes TAKE ONE TABLET BY MOUTH EVERYDAY AT BEDTIME Jettie Booze, MD Taking Active   Semaglutide, 2 MG/DOSE, 8 MG/3ML SOPN [id number] Yes Inject 2 mg as directed once a week. [name], MD Taking Active    tadalafil (CIALIS) 20 MG tablet [id number] Yes TAKE ONE TABLET BY MOUTH every other DAY AS NEEDED Burchette, Alinda Sierras, MD Taking Active   vitamin B-12 (CYANOCOBALAMIN) 1000 MCG tablet [id number] Yes Take 1,000 mcg by mouth at bedtime.  [provider] Taking Active Self            Patient Active Problem List   Diagnosis Date Noted   Erectile dysfunction 03/11/2020   CKD (chronic kidney disease) stage 3, GFR 30-59 ml/min (HCC) 11/06/2019   Laryngopharyngeal reflux (LPR) 06/12/2019   Perennial allergic rhinitis 06/12/2019   Chronic right shoulder pain 09/24/2016   Cough 08/19/2016   Hemoptysis 08/19/2016   Normocytic anemia 06/29/2016   Hypertension 12/25/2015   Obesity (BMI 30-39.9) 02/02/2014   Acute URI 10/10/2012   Overweight 04/05/2012   Venous insufficiency 04/02/2011   HEADACHE 02/25/2008   CHEST PAIN, ATYPICAL 02/25/2008   HYPERCHOLESTEROLEMIA 02/24/2008   Poorly controlled type 2 diabetes mellitus ([place]) 02/23/2008   ANXIETY 02/23/2008   GERD 02/23/2008   PSORIASIS 02/23/2008   DEGENERATIVE JOINT DISEASE 02/23/2008   COLONIC POLYPS, HX OF 02/23/2008    Immunization History  Administered Date(s) Administered   Fluad Quad(high Dose 65+) 05/09/2019, 06/11/2020, 06/30/2022   Influenza Split 10/05/2011   Influenza Whole 07/16/2009   Influenza, High Dose Seasonal PF 05/22/2015, 06/29/2016, 06/15/2017, 05/20/2018   Influenza,inj,Quad PF,6+ Mos 07/07/2013, 05/04/2014   PFIZER(Purple Top)SARS-COV-2 Vaccination 10/22/2019, 11/15/2019, 07/09/2020   Pneumococcal Conjugate-13 08/21/2014   Pneumococcal Polysaccharide-23 12/05/2012   Pneumococcal-Unspecified 10/11/2007   Td 03/09/2006   Tdap 03/25/2016   Zoster, Live 03/31/2016    CGM report:     11/28: two waffles with sugar free syrup, crackers at 11:45 and beef stick; no supper that night  11/29: 2 two eggs, 1 pm: hamburger steak and sauteed onions, french fries and 1/2 and 1/2 iced tea; supper: apple  turnover  11/30: apple turnover, 1pm: BBQ sandwich; supper: can of chicken noodle soup  12/1: apple turnover; potato soup for supper  12/2: 2 eggs, beef steak (like jerky), supper: prime rib, cream potatoes and green beans, shrimp and meat balls and pigs in a blanket and veggies with dip  12/3: apple turnover; lunch: collard greens, 2 biscuits (grand flaky); no supper (not sure); alarm went off at 1 am and ate M&Ms  12/4: 2 scrambled eggs, 4pm: small bowl of collards; no dinner  12/5: blueberry muffin  Patient needs to add in more protein to his meals and will work on doing this. Patient also needs to scale back on the carbs with his meals, as he does have multiple with some meals or only carbs with a meal. He will start with breakfast to add more protein and either half a muffin or no muffin at all. He loves a soup: hamburger, carrots, peas, corn and tomatoes.   Conditions to be addressed/monitored:  Hypertension, Hyperlipidemia, Diabetes, Chronic Kidney Disease and ED  Conditions addressed this visit: Diabetes, CKD  Care Plan : CCM Pharmacy Care Plan  Updates made by Viona Gilmore, [place] since 08/04/2022 12:00 AM     Problem: Problem: Hypertension, Hyperlipidemia, Diabetes, Chronic Kidney Disease and ED      Long-Range Goal: Patient-Specific Goal   Start Date: 11/20/2020  Expected End Date: 11/20/2021  Recent Progress: On track  Priority: High  Note:   Current Barriers:  Unable to independently monitor therapeutic efficacy Unable to achieve control of diabetes   Pharmacist Clinical Goal(s):  Patient will achieve adherence to monitoring guidelines and medication adherence to achieve therapeutic efficacy achieve control of diabetes as evidenced by A1c  through collaboration with PharmD and provider.   Interventions: 1:1 collaboration with [name], MD regarding development and update of comprehensive plan of care as evidenced by provider  attestation and  co-signature Inter-disciplinary care team collaboration (see longitudinal plan of care) Comprehensive medication review performed; medication list updated in electronic medical record  Hypertension (BP goal <140/90) -Controlled -Current treatment: Lisinopril 71m, 1 tablet once daily - Appropriate, Effective, Safe, Accessible -Medications previously tried: isosorbide  -Current home readings: not checking regularly -Current dietary habits: did not discuss  -Current exercise habits: did not discuss -Denies hypotensive/hypertensive symptoms -Educated on Exercise goal of 150 minutes per week; Importance of home blood pressure monitoring; -Counseled to monitor BP at home weekly, document, and provide log at future appointments -Counseled on diet and exercise extensively Recommended to continue current medication  Hyperlipidemia: (LDL goal < 55) -Controlled -Current treatment: Rosuvastatin 225m 1 tablet once daily - Appropriate, Effective, Safe, Accessible -Medications previously tried: rosuvastatin (elevated BGs), simvastatin (myalgias)  -Current dietary patterns: did not discuss -Current exercise habits: did not discuss -Educated on Cholesterol goals;  Importance of limiting foods high in cholesterol; Exercise goal of 150 minutes per week; -Recommended to continue current medication  Diabetes (A1c goal <7%) -Uncontrolled -Current medications: Glipizide 5 mg 1 tablet once daily with breakfast - Appropriate, Query effective, Safe, Accessible Empagliflozin (Jardiance) 1042m1 tablet once daily - Appropriate, Query effective, Safe, Accessible Metformin 500m68m tablets twice daily - Appropriate, Query effective, Safe, Accessible Pioglitazone (Actos) 45mg47mtablet once daily -  Appropriate, Query effective, Safe, Accessible Ozempic, inject 2 mg once weekly - Appropriate, Query effective, Safe, Accessible -Medications previously tried: Onglyza (cost), Januvia (switch to GLP1),  Trulicity (nausea)  -Current home glucose readings fasting glucose: refer to CGM report post prandial glucose: not checking -Denies hypoglycemic/hyperglycemic symptoms -Current meal patterns:  breakfast: jimmy dean sausage biscuits x 2, blueberry muffin lunch: sandwich, beans, franks  dinner: cheeseburger; apple pie; frozen stuff peppers; [place] mac n'cheese and apples snacks: ice cream sandwiches, klondike bar drinks: n/a -Current exercise: little walking; plans to do more with cooler weather -Educated on Exercise goal of 150 minutes per week; Benefits of routine self-monitoring of blood sugar; Carbohydrate counting and/or plate method -Counseled to check feet daily and get yearly eye exams -Counseled on diet and exercise extensively Recommended to continue current medication Recommended adding more protein to his meals.  CKD (Goal: improve kidney function) -Controlled -Current treatment  Jardiance 10 mg 1 tablet daily - Appropriate, Effective, Safe, Accessible -Medications previously tried: none  -Recommended to continue current medication  CAD (Goal: prevent heart attacks and strokes) -Controlled -Current treatment  Aspirin 81 mg 1 tablet daily - Appropriate, Effective, Safe, Accessible -Medications previously tried: none  -Counseled on monitoring for bleeding and bruising  ED (Goal: minimize symptoms) -Controlled -Current treatment  Cialis 5 mg 1 tablet as needed - Appropriate, Effective, Safe, Accessible -Medications previously tried: sildenafil (no longer effective)  -Recommended to continue current medication  Health Maintenance -Vaccine gaps: shingrix, COVID booster -Current therapy:  Vitamin B12 1000 mcg 1 tablet daily -Educated on Cost vs benefit of each product must be carefully weighed by individual consumer -Patient is satisfied with current therapy and denies issues -Recommended to continue current medication  Patient Goals/Self-Care  Activities Patient will:  - take medications as prescribed check glucose daily, document, and provide at future appointments check blood pressure weekly, document, and provide at future appointments target a minimum of 150 minutes of moderate intensity exercise weekly engage in dietary modifications by eating more non-starchy vegetables than carbs  Follow Up Plan: Telephone follow up appointment with care management team member scheduled for: 1 week  Medication Assistance:  Ozempic obtained through [employer] medication assistance program.  Enrollment ends 08/30/22    Compliance/Adherence/Medication fill history: Care Gaps: Shingrix, COVID booster, Prevnar 20, eye exam, colonoscopy BP- 100/60 06/30/22  A1c: 7.3% (06/30/22)  Star-Rating Drugs: Jardiance 10 mg - Last filed 06/22/22 30 DS at [facility] Rosuvastatin 20 mg - Last filled 06/22/22 30 DS at [facility] Lisinopril 10 mg- Last filled 06/22/22 30 DS at [facility] Pioglitazone 45 mg - Last filled 06/22/22 30 DS at [facility] Metformin 500 mg - Last filled 06/22/22 30 DS at [facility] Ozempic 2 mg - obtained through PAP  Patient's preferred pharmacy is:  [facility] - [place], [place] - [address] [place] [address] Phone: [phone] Fax: [place] [place] [address] [place] [address] Phone: [phone] Fax: [phone]   Uses pill box? No - adherence packaging Pt endorses 100% compliance  We discussed: Benefits of medication synchronization, packaging and delivery as well as enhanced pharmacist oversight with [facility]. Patient decided to: Utilize [facility] pharmacy for medication synchronization, packaging and delivery  Care Plan and Follow Up Patient Decision:  Patient agrees to Care Plan and Follow-up.  Plan: The care management team will reach out to the patient again over  the next 60 days.  [name], PharmD [facility] Clinical Pharmacist [place] at [place]

## 2022-08-04 ENCOUNTER — Ambulatory Visit (INDEPENDENT_AMBULATORY_CARE_PROVIDER_SITE_OTHER): Payer: Medicare Other | Admitting: Pharmacist

## 2022-08-04 DIAGNOSIS — E1165 Type 2 diabetes mellitus with hyperglycemia: Secondary | ICD-10-CM

## 2022-08-04 DIAGNOSIS — I10 Essential (primary) hypertension: Secondary | ICD-10-CM

## 2022-08-04 NOTE — Patient Instructions (Signed)
Hi Kreed,  It was great to catch up again!  Please reach out to me if you have any questions or need anything before our follow up!  Best, Maddie  [name], PharmD, [place] Pharmacist [place] at [place]

## 2022-08-12 ENCOUNTER — Telehealth: Payer: Self-pay | Admitting: Pharmacist

## 2022-08-12 NOTE — Chronic Care Management (AMB) (Signed)
Chronic Care Management Pharmacy Assistant   Name: Lucas Warren  MRN: [id number] DOB: 1946-08-09  Reason for Encounter: Medication Review/ Medication Coordination    Recent office visits:  None  Recent consult visits:  None  Hospital visits:  None in previous 6 months  Medications: Outpatient Encounter Medications as of 08/12/2022  Medication Sig   aspirin 81 MG tablet Take 1 tablet by mouth at bedtime.    glipiZIDE (GLUCOTROL) 5 MG tablet Take 1 tablet (5 mg total) by mouth daily before breakfast.   ipratropium (ATROVENT) 0.06 % nasal spray Place 2 sprays each nostril up to 4 times a day for symptoms relief   JARDIANCE 10 MG TABS tablet TAKE ONE TABLET BY MOUTH EVERY MORNING   Lancets (ONETOUCH DELICA PLUS [id number]) MISC USE ONE TO CHECK GLUCOSE TWICE DAILY   levocetirizine (XYZAL) 5 MG tablet Take 1 tablet (5 mg total) by mouth every evening.   lisinopril (ZESTRIL) 10 MG tablet TAKE ONE TABLET BY MOUTH EVERY EVENING   metFORMIN (GLUCOPHAGE) 1000 MG tablet Take 1 tablet (1,000 mg total) by mouth 2 (two) times daily with a meal.   nitroGLYCERIN (NITROSTAT) 0.4 MG SL tablet Take one tablet sublingual as needed for chest pain up to three doses as needed.   ONETOUCH ULTRA test strip USE ONE STRIP TWICE DAILY TO CHECK BLOOD SUGARS   pioglitazone (ACTOS) 45 MG tablet TAKE ONE TABLET BY MOUTH EVERY MORNING   rosuvastatin (CRESTOR) 20 MG tablet TAKE ONE TABLET BY MOUTH EVERYDAY AT BEDTIME   Semaglutide, 2 MG/DOSE, 8 MG/3ML SOPN Inject 2 mg as directed once a week.   tadalafil (CIALIS) 20 MG tablet TAKE ONE TABLET BY MOUTH every other DAY AS NEEDED   vitamin B-12 (CYANOCOBALAMIN) 1000 MCG tablet Take 1,000 mcg by mouth at bedtime.    No facility-administered encounter medications on file as of 08/12/2022.   Reviewed chart for medication changes ahead of medication coordination call.   BP Readings from Last 3 Encounters:  06/30/22 100/60  06/12/22 126/84  05/06/22 112/68     Lab Results  Component Value Date   HGBA1C 7.3 (A) 06/30/2022     Patient obtains medications through Adherence Packaging  30 Days   Last adherence delivery included:   Empagliflozin (Jardiance) 10 mg: one tablet at breakfast Rosuvastatin 20 mg: one tablet at bedtime Metformin (Glucophage) 500 mg: two tablets at breakfast and two at dinner Lisinopril (Zestril) 10 mg: one tablet at dinner Pioglitazone (Actos) 45 mg: one tablet at breakfast Levocetirizine 5 mg: one tab at dinner Glipizide 5 mg: one tab at breakfast (patient aware of switch from glimepiride)   Patient declined the following medications: Strips Lancets Patient reports he is ok as he has been using Dexcom the past month and still has some if he is needing.    Confirmed delivery date of 07/24/22, advised patient that pharmacy will contact them the morning of delivery.   Patient is due for next adherence delivery on: 08/25/22. Called patient and reviewed medications and coordinated delivery. Packs 30 DS  This delivery to include: Empagliflozin (Jardiance) 10 mg: one tablet at breakfast Rosuvastatin 20 mg: one tablet at bedtime Metformin (Glucophage) 1000 mg: one tablet at breakfast and one at dinner Lisinopril (Zestril) 10 mg: one tablet at dinner Pioglitazone (Actos) 45 mg: one tablet at breakfast Levocetirizine 5 mg: one tab at dinner Glipizide 5 mg: one tab at breakfast      Confirmed delivery date of 08/25/22, advised patient that pharmacy  will contact them the morning of delivery.   Care Gaps: COVID Booster - Overdue Eye Exam - Overdue Colonoscopy - Overdue Zoster vaccine - Postponed PNA Vaccine - Postponed CCM-  12/23 BP- 100/60 06/30/22 AWV- 04/30/22 Lab Results  Component Value Date   HGBA1C 7.3 (A) 06/30/2022    Star Rating Drugs: Jardiance 10 mg - Last filed 07/20/22 30 DS at [facility] Rosuvastatin 20 mg - Last filled 07/20/22 30 DS at [facility] Lisinopril 10 mg- Last filled 07/20/22 30 DS  at [facility] Pioglitazone 45 mg - Last filled 07/20/22 30 DS at [facility] Metformin 500 mg - Last filled 07/2022 30 DS at [facility] Ozempic 2 mg - obtained through [place] Pharmacist Assistant [phone]

## 2022-08-13 ENCOUNTER — Ambulatory Visit (AMBULATORY_SURGERY_CENTER): Payer: Self-pay

## 2022-08-13 VITALS — Ht 71.0 in | Wt 191.0 lb

## 2022-08-13 DIAGNOSIS — Z1211 Encounter for screening for malignant neoplasm of colon: Secondary | ICD-10-CM

## 2022-08-13 MED ORDER — NA SULFATE-K SULFATE-MG SULF 17.5-3.13-1.6 GM/177ML PO SOLN: 1.0000 | Freq: Once | ORAL | 0 refills | Status: AC

## 2022-08-13 NOTE — Progress Notes (Signed)
No egg or soy allergy known to patient  No issues known to pt with past sedation with any surgeries or procedures Patient denies ever being told they had issues or difficulty with intubation  No FH of Malignant Hyperthermia Pt is not on diet pills Pt is not on  home 02  Pt is not on blood thinners  Pt denies issues with constipation  No A fib or A flutter Have any cardiac testing pending--no Pt instructed to use Singlecare.[email or web address] or GoodRx for a price reduction on prep   

## 2022-08-28 DIAGNOSIS — R197 Diarrhea, unspecified: Secondary | ICD-10-CM | POA: Diagnosis not present

## 2022-08-28 DIAGNOSIS — R109 Unspecified abdominal pain: Secondary | ICD-10-CM | POA: Diagnosis not present

## 2022-08-28 LAB — COMPREHENSIVE METABOLIC PANEL
Albumin: 4.1 (ref 3.5–5.0)
Calcium: 9.1 (ref 8.7–10.7)
Globulin: 2.9
eGFR: 45

## 2022-08-28 LAB — HEPATIC FUNCTION PANEL
ALT: 7 U/L — AB (ref 10–40)
AST: 11 — AB (ref 14–40)

## 2022-08-28 LAB — BASIC METABOLIC PANEL
CO2: 24 — AB (ref 13–22)
Chloride: 103 (ref 99–108)
Creatinine: 1.6 — AB (ref 0.6–1.3)
Potassium: 3.7 mEq/L (ref 3.5–5.1)
Sodium: 138 (ref 137–147)

## 2022-08-30 DIAGNOSIS — E1165 Type 2 diabetes mellitus with hyperglycemia: Secondary | ICD-10-CM

## 2022-08-30 DIAGNOSIS — I10 Essential (primary) hypertension: Secondary | ICD-10-CM

## 2022-09-01 ENCOUNTER — Telehealth: Payer: Self-pay

## 2022-09-01 NOTE — Telephone Encounter (Signed)
---  Caller states he ate out on Christmas. He started having diarrhea that night. Everything he eats runs right through him. He is drinking. No vomiting or fever. It is completely watery  08/28/2022 10:10:36 AM See PCP within 24 Hours  Alveta Heimlich, RN, [facility] Paganini  Comments User: Debby Bud, RN Date/Time Eilene Ghazi Time): 08/28/2022 10:12:38 AM Upgraded due to age. Has had no diarrhea today but should be checked because had at least 7 times yesterday and the 2 days before, had much more than that.  Referrals REFERRED TO PCP OFFICE

## 2022-09-01 NOTE — Telephone Encounter (Signed)
I spoke with the patient and he reported that he is feeling better today. Patient reported that he has been experiencing abdominal pains and diarrhea since Christmas day. Patient stated that he has been increasing fluid intake and reported no fever at this time. Patient has been scheduled to see Dr. Sarajane Jews tomorrow for evaluation.

## 2022-09-02 ENCOUNTER — Encounter: Payer: Self-pay | Admitting: Family Medicine

## 2022-09-02 ENCOUNTER — Ambulatory Visit (INDEPENDENT_AMBULATORY_CARE_PROVIDER_SITE_OTHER): Payer: Medicare Other | Admitting: Family Medicine

## 2022-09-02 VITALS — BP 98/76 | HR 82 | Temp 97.5°F | Wt 182.0 lb

## 2022-09-02 DIAGNOSIS — K529 Noninfective gastroenteritis and colitis, unspecified: Secondary | ICD-10-CM

## 2022-09-02 MED ORDER — METRONIDAZOLE 500 MG PO TABS: 500.0000 mg | ORAL_TABLET | Freq: Three times a day (TID) | ORAL | 0 refills | Status: DC

## 2022-09-02 MED ORDER — DIPHENOXYLATE-ATROPINE 2.5-0.025 MG PO TABS: 2.0000 | ORAL_TABLET | Freq: Four times a day (QID) | ORAL | 0 refills | Status: DC | PRN

## 2022-09-02 MED ORDER — CIPROFLOXACIN HCL 500 MG PO TABS: 500.0000 mg | ORAL_TABLET | Freq: Two times a day (BID) | ORAL | 0 refills | Status: AC

## 2022-09-02 NOTE — Progress Notes (Signed)
   Subjective:    Patient ID: Lucas Warren, male    DOB: [birth year], 77 y.o.   MRN: [id number]  HPI Here for 10 days of abdominal cramps and watery diarrhea. No nausea or vomiting. No fever. This started a few hours after he had a home made dinner at a family member's house. Part of the dinner was deviled eggs. To his knowledge no one else has been sick. He has had no recent travel. He has taken Imodium with no relief. He is drinking lots of fluids including water and Gatorade. He was seen at an urgent care clinic on 08-28-22, but no diagnosis was found. He had lab work done that was unremarkable.    Review of Systems  Constitutional: Negative.   Respiratory: Negative.    Cardiovascular: Negative.   Gastrointestinal:  Positive for abdominal pain and diarrhea. Negative for abdominal distention, blood in stool, constipation, nausea, rectal pain and vomiting.  Genitourinary: Negative.        Objective:   Physical Exam Constitutional:      Appearance: Normal appearance. He is not ill-appearing.  Cardiovascular:     Rate and Rhythm: Normal rate and regular rhythm.     Pulses: Normal pulses.     Heart sounds: Normal heart sounds.  Pulmonary:     Effort: Pulmonary effort is normal.     Breath sounds: Normal breath sounds.  Abdominal:     General: Abdomen is flat. Bowel sounds are normal. There is no distension.     Palpations: Abdomen is soft. There is no mass.     Tenderness: There is no abdominal tenderness. There is no guarding or rebound.     Hernia: No hernia is present.  Neurological:     Mental Status: He is alert.           Assessment & Plan:  Enteritis, possibly a Salmonella infection given the history above. We will treat with 10 days of Cipro and Metronidazole. He can use Lomotil as needed.  Alysia Penna, MD

## 2022-09-07 ENCOUNTER — Encounter: Payer: Self-pay | Admitting: Family Medicine

## 2022-09-08 ENCOUNTER — Telehealth: Payer: Self-pay | Admitting: Pharmacist

## 2022-09-08 NOTE — Telephone Encounter (Signed)
Patient called me as he injected his last dose of Ozempic on Sunday 1/7 and does not have any more pens. Per [employer] patient assistance program, his next shipment has already shipped but unsure of what date or the arrival date per the automated line.  Informed patient of this and requested for him to call the office back on Friday this week to check to make sure we have not received this. Did set aside 2 sample pens of the starter dose (0.5 mg) but instructed him to give 2 mg with the pen (4 injections of the 0.5 mg back to back) if we do not receive his supply in by Friday 1/12. Patient verbalized his understanding.

## 2022-09-10 ENCOUNTER — Other Ambulatory Visit: Payer: Self-pay | Admitting: Family Medicine

## 2022-09-11 ENCOUNTER — Telehealth: Payer: Self-pay | Admitting: Pharmacist

## 2022-09-11 NOTE — Progress Notes (Signed)
Care Coordination Pharmacy Assistant   Patient ID: Lucas Warren, male   DOB: March 31, 1946, 77 y.o.   MRN: [id number]  Reason for Encounter: Medication Review Medication Coordination  Recent office visits:  09/02/22 Laurey Morale, MD - Patient presented for Enteritis. Prescribed Ciprofloxacin. Prescribed Lomotil . Prescribed Flagyl  Recent consult visits:  12/29//23 Imelda Pillow (Urgent Care) - Patient presented for stomach pain and diarrhea. No medication changes.   Hospital visits:  None in previous 6 months  Medications: Outpatient Encounter Medications as of 09/11/2022  Medication Sig   aspirin 81 MG tablet Take 1 tablet by mouth once a week.   ciprofloxacin (CIPRO) 500 MG tablet Take 1 tablet (500 mg total) by mouth 2 (two) times daily for 10 days.   diphenoxylate-atropine (LOMOTIL) 2.5-0.025 MG tablet Take 2 tablets by mouth 4 (four) times daily as needed for diarrhea or loose stools.   glipiZIDE (GLUCOTROL) 5 MG tablet Take 1 tablet (5 mg total) by mouth daily before breakfast.   ipratropium (ATROVENT) 0.06 % nasal spray Place 2 sprays each nostril up to 4 times a day for symptoms relief   JARDIANCE 10 MG TABS tablet TAKE ONE TABLET BY MOUTH EVERY MORNING   Lancets (ONETOUCH DELICA PLUS [id number]) MISC USE ONE TO CHECK GLUCOSE TWICE DAILY   levocetirizine (XYZAL) 5 MG tablet Take 1 tablet (5 mg total) by mouth every evening.   lisinopril (ZESTRIL) 10 MG tablet TAKE ONE TABLET BY MOUTH EVERY EVENING   metFORMIN (GLUCOPHAGE) 1000 MG tablet Take 1 tablet (1,000 mg total) by mouth 2 (two) times daily with a meal.   metroNIDAZOLE (FLAGYL) 500 MG tablet Take 1 tablet (500 mg total) by mouth 3 (three) times daily.   nitroGLYCERIN (NITROSTAT) 0.4 MG SL tablet Take one tablet sublingual as needed for chest pain up to three doses as needed.   ONETOUCH ULTRA test strip USE ONE STRIP TWICE DAILY TO CHECK BLOOD SUGARS   pioglitazone (ACTOS) 45 MG tablet TAKE ONE TABLET BY MOUTH EVERY  MORNING   rosuvastatin (CRESTOR) 20 MG tablet TAKE ONE TABLET BY MOUTH EVERYDAY AT BEDTIME   Semaglutide, 2 MG/DOSE, 8 MG/3ML SOPN Inject 2 mg as directed once a week.   tadalafil (CIALIS) 20 MG tablet TAKE ONE TABLET BY MOUTH every other DAY AS NEEDED   vitamin B-12 (CYANOCOBALAMIN) 1000 MCG tablet Take 1,000 mcg by mouth at bedtime.    No facility-administered encounter medications on file as of 09/11/2022.   Reviewed chart for medication changes ahead of medication coordination call.   BP Readings from Last 3 Encounters:  09/02/22 98/76  06/30/22 100/60  06/12/22 126/84    Lab Results  Component Value Date   HGBA1C 7.3 (A) 06/30/2022     Patient obtains medications through Adherence Packaging  30 Days   Last adherence delivery included: Empagliflozin (Jardiance) 10 mg: one tablet at breakfast Rosuvastatin 20 mg: one tablet at bedtime Metformin (Glucophage) 1000 mg: one tablet at breakfast and one at dinner Lisinopril (Zestril) 10 mg: one tablet at dinner Pioglitazone (Actos) 45 mg: one tablet at breakfast Levocetirizine 5 mg: one tab at dinner Glipizide 5 mg: one tab at breakfast     Patient is due for next adherence delivery on: 09/23/22. Called patient and reviewed medications and coordinated delivery. Packs 30 DS  This delivery to include: Empagliflozin (Jardiance) 10 mg: one tablet at breakfast Rosuvastatin 20 mg: one tablet at bedtime Metformin (Glucophage) 1000 mg: one tablet at breakfast and one at dinner Lisinopril (  Zestril) 10 mg: one tablet at dinner Pioglitazone (Actos) 45 mg: one tablet at breakfast Levocetirizine 5 mg: one tab at dinner Glipizide 5 mg: one tab at breakfast    Pt requested test strips/lancets sent over refill request so they may be added to this upcoming delivery.   Confirmed delivery date of 09/23/22, advised patient that pharmacy will contact them the morning of delivery.   Care Gaps: Zoster Vaccine -  Overdue COVID Booster -  Overdue Eye Exam - Overdue Colonoscopy - Overdue PNA Vaccine - Postponed BP-120/70 home 09/11/22 AWV- 04/30/22 Lab Results  Component Value Date   HGBA1C 7.3 (A) 06/30/2022    Star Rating Drugs: Jardiance 10 mg - Last filed 08/19/22 30 DS at [facility] Rosuvastatin 20 mg - Last filled 08/19/22 30 DS at [facility] Lisinopril 10 mg- Last filled 08/19/22 30 DS at [facility] Pioglitazone 45 mg - Last filled 08/19/22 30 DS at [facility] Metformin 500 mg - Last filled 07/2022 30 DS at [facility] Ozempic 2 mg - obtained through [place] Pharmacist Assistant [phone]

## 2022-09-14 ENCOUNTER — Encounter: Payer: Medicare Other | Admitting: Internal Medicine

## 2022-09-14 MED ORDER — ONETOUCH DELICA PLUS LANCET33G MISC: 3 refills | Status: DC

## 2022-09-14 NOTE — Telephone Encounter (Signed)
-----  Message from Ned Clines sent at 09/11/2022 10:46 AM EST ----- Regarding: refill on strips and lancets When you have a moment non urgent he needs refills of the following sent to [facility] please he is due for delivery on 1/24 so will need it sent prior to that  Lancets (ONETOUCH DELICA PLUS [id number]) [place] [[id number]] [facility] ULTRA test strip [[id number]]  Thanks   [place] Pharmacist Assistant [phone]

## 2022-09-21 ENCOUNTER — Other Ambulatory Visit: Payer: Self-pay | Admitting: Family Medicine

## 2022-09-21 NOTE — Telephone Encounter (Signed)
-----  Message from Ned Clines sent at 09/21/2022 10:21 AM EST ----- Regarding: Refills Good Morning! I missed this on last week so apologies for the urgency in the request, he is out of strips and due do have a delivery this Thursday if you could assist please and Thank You!  ONETOUCH ULTRA test strip [id number]

## 2022-09-29 ENCOUNTER — Ambulatory Visit (INDEPENDENT_AMBULATORY_CARE_PROVIDER_SITE_OTHER): Payer: Medicare Other | Admitting: Family Medicine

## 2022-09-29 ENCOUNTER — Encounter: Payer: Self-pay | Admitting: Family Medicine

## 2022-09-29 VITALS — BP 108/66 | HR 67 | Temp 97.5°F | Ht 71.0 in | Wt 186.0 lb

## 2022-09-29 DIAGNOSIS — I10 Essential (primary) hypertension: Secondary | ICD-10-CM | POA: Diagnosis not present

## 2022-09-29 DIAGNOSIS — E1165 Type 2 diabetes mellitus with hyperglycemia: Secondary | ICD-10-CM | POA: Diagnosis not present

## 2022-09-29 DIAGNOSIS — E7800 Pure hypercholesterolemia, unspecified: Secondary | ICD-10-CM

## 2022-09-29 LAB — LIPID PANEL
Cholesterol: 126 mg/dL (ref 0–200)
HDL: 51.8 mg/dL (ref >39.00)
LDL Cholesterol: 53 mg/dL (ref 0–99)
NonHDL: 73.87
Total CHOL/HDL Ratio: 2
Triglycerides: 106 mg/dL (ref 0.0–149.0)
VLDL: 21.2 mg/dL (ref 0.0–40.0)

## 2022-09-29 LAB — POCT GLYCOSYLATED HEMOGLOBIN (HGB A1C): Hemoglobin A1C: 7.2 % — AB (ref 4.0–5.6)

## 2022-09-29 NOTE — Progress Notes (Signed)
Established Patient Office Visit  Subjective   Patient ID: Lucas Warren, male    DOB: 1945/11/08  Age: 77 y.o. MRN: [id number]  Chief Complaint  Patient presents with   Medical Management of Chronic Issues    HPI   Lucas Warren is seen for medical follow-up.  Around Christmas he developed abdominal cramps and diarrhea for several days.  He described watery diarrhea up to 10 stools per day.  He went to urgent care on the 29th and had labs that were fairly unremarkable.  Was felt to have viral bug.  He was then subsequently seen here January 3 and treated with Cipro, metronidazole, and Lomotil and after few days his symptoms resolved.  Denied ever having bloody stools or any associated fever.  He is scheduled for repeat colonoscopy in February  He has struggled to gain his strength back somewhat since then but appetite is improving.  He has steadily lost weight since starting Ozempic.  His weight topped out at 235 pounds he has lost about 50 pounds thus far.  Blood sugars have been improving.  Other diabetic drugs include glipizide 5 mg daily, Jardiance 10 mg daily, metformin 1000 mg twice daily, and Actos 45 mg daily.  He has hyperlipidemia treated with Crestor 20 mg daily.  Due for follow-up lipids.  He had recent CMP when he was ill and those labs were reviewed and stable.  Occasional lightheadedness with standing but not consistently.  Past Medical History:  Diagnosis Date   Acute URI 10/10/2012   Allergy    seasonal   Anxiety    Cataract    Chest pain, atypical    Colon polyps    hyperplastic 2003   DJD (degenerative joint disease)    DM (diabetes mellitus) ([place])    GERD (gastroesophageal reflux disease)    History of headache    Hypercholesteremia    Psoriasis    Past Surgical History:  Procedure Laterality Date   CATARACT EXTRACTION, BILATERAL Bilateral    Dr. Tommy Rainwater;    [place] CATH AND CORONARY ANGIOGRAPHY N/A 05/20/2020    Procedure: LEFT HEART CATH AND CORONARY ANGIOGRAPHY;  Surgeon: Jettie Booze, MD;  Location: [facility];  Service: Cardiovascular;  Laterality: N/A;   NASAL SEPTUM SURGERY  1989   SINOSCOPY     TONSILLECTOMY      reports that he quit smoking about 41 years ago. His smoking use included cigarettes. He has a 40.00 pack-year smoking history. He has never used smokeless tobacco. He reports current alcohol use. He reports that he does not use drugs. family history includes Alzheimer's disease in his father; Heart disease in his father; Leukemia in his sister; Lung cancer in his mother. Allergies  Allergen Reactions   Lisinopril Cough   Erythromycin     REACTION: abd pain   Trulicity [Dulaglutide] Nausea Only    Review of Systems  Constitutional:  Negative for chills, fever and malaise/fatigue.  Eyes:  Negative for blurred vision.  Respiratory:  Negative for shortness of breath.   Cardiovascular:  Negative for chest pain.  Neurological:  Negative for dizziness, weakness and headaches.      Objective:     BP 108/66 (BP Location: Left Arm, Patient Position: Sitting, Cuff Size: Normal)   Pulse 67   Temp (!) 97.5 F (36.4 C) (Oral)   Ht '5\' 11"'$  (1.803 m)   Wt 186 lb (84.4 kg)  SpO2 97%   BMI 25.94 kg/m  BP Readings from Last 3 Encounters:  09/29/22 108/66  09/02/22 98/76  06/30/22 100/60   Wt Readings from Last 3 Encounters:  09/29/22 186 lb (84.4 kg)  09/02/22 182 lb (82.6 kg)  08/13/22 191 lb (86.6 kg)      Physical Exam Vitals reviewed.  Constitutional:      Appearance: He is well-developed.  Eyes:     Pupils: Pupils are equal, round, and reactive to light.  Neck:     Thyroid: No thyromegaly.  Cardiovascular:     Rate and Rhythm: Normal rate and regular rhythm.  Pulmonary:     Effort: Pulmonary effort is normal. No respiratory distress.     Breath sounds: Normal breath sounds. No wheezing or rales.  Musculoskeletal:     Cervical back: Neck  supple.  Neurological:     Mental Status: He is alert and oriented to person, place, and time.      Results for orders placed or performed in visit on 09/29/22  POC HgB A1c  Result Value Ref Range   Hemoglobin A1C 7.2 (A) 4.0 - 5.6 %   HbA1c POC (<> result, manual entry)     HbA1c, POC (prediabetic range)     HbA1c, POC (controlled diabetic range)      Last CBC Lab Results  Component Value Date   WBC 7.4 11/26/2021   HGB 11.8 (L) 11/26/2021   HCT 35.7 (L) 11/26/2021   MCV 86.4 11/26/2021   MCH 28.9 05/15/2020   RDW 15.1 11/26/2021   PLT 218.0 25/42/7062   Last metabolic panel Lab Results  Component Value Date   GLUCOSE 241 (H) 11/26/2021   NA 138 08/28/2022   K 3.7 08/28/2022   CL 103 08/28/2022   CO2 24 (A) 08/28/2022   BUN 22 11/26/2021   CREATININE 1.6 (A) 08/28/2022   EGFR 45 08/28/2022   CALCIUM 9.1 08/28/2022   PROT 7.2 11/26/2021   ALBUMIN 4.1 08/28/2022   BILITOT 0.4 11/26/2021   ALKPHOS 44 11/26/2021   AST 11 (A) 08/28/2022   ALT 7 (A) 08/28/2022   Last lipids Lab Results  Component Value Date   CHOL 109 09/15/2021   HDL 50.50 09/15/2021   LDLCALC 37 09/15/2021   TRIG 109.0 09/15/2021   CHOLHDL 2 09/15/2021   Last hemoglobin A1c Lab Results  Component Value Date   HGBA1C 7.2 (A) 09/29/2022   Last thyroid functions Lab Results  Component Value Date   TSH 0.71 11/26/2021      The ASCVD Risk score (Arnett DK, et al., 2019) failed to calculate for the following reasons:   The valid total cholesterol range is 130 to 320 mg/dL    Assessment & Plan:   #1 type 2 diabetes.  Continues to improve with A1c today 7.2%.  On multidrug regimen as above.  His weight has decreased almost 50 pounds.  We did discuss the fact that we might want to back off his Ozempic if he continues to lose.  Might also eventually consider tapering back his Actos or potentially discontinuing glipizide if control continues to improve  #2 hyperlipidemia treated with  Crestor.  Recheck fasting lipid panel today.  He had recent CMP which is unremarkable.  #3 hypertension stable on lisinopril 10 mg daily.  May have to back off lisinopril dose as his weight loss continues  [name], MD

## 2022-09-29 NOTE — Patient Instructions (Signed)
A1C has improved further to 7.2%.    If weight does not level off soon we might want to back the Ozempic to 1 mg [place] per week.

## 2022-10-02 ENCOUNTER — Encounter: Payer: Self-pay | Admitting: Internal Medicine

## 2022-10-12 ENCOUNTER — Telehealth: Payer: Self-pay

## 2022-10-12 NOTE — Progress Notes (Signed)
Patient ID: Lucas Warren, male   DOB: 12-22-1945, 77 y.o.   MRN: [id number]  Care Management & Coordination Services Pharmacy Team  Reason for Encounter: Medication coordination and delivery  Contacted patient to discuss medications and coordinate delivery from [facility]. Spoke with patient on 10/12/2022  Cycle dispensing form sent to [facility] for review.   Last adherence delivery date:08/25/22      Patient is due for next adherence delivery on: 10/22/22  This delivery to include: Adherence Packaging  30 Days  Empagliflozin (Jardiance) 10 mg: one tablet at breakfast Rosuvastatin 20 mg: one tablet at bedtime Metformin (Glucophage) 1000 mg: one tablet at breakfast and one at dinner Lisinopril (Zestril) 10 mg: one tablet at dinner Pioglitazone (Actos) 45 mg: one tablet at breakfast Levocetirizine 5 mg: one tab at dinner Glipizide 5 mg: one tab at breakfast     Any concerns about your medications? No  How often do you forget or accidentally miss a dose? Never  Do you use a pillbox? No  Is patient in packaging Yes     Chart review: Recent office visits:  09/29/22 [name], MD - Patient presented for Poorly controlled type 2 diabetes mellitus and other concerns. Stopped Lomotil. Stopped Flagyl.  Recent consult visits:  None  Hospital visits:  None in previous 6 months  Medications: Outpatient Encounter Medications as of 10/12/2022  Medication Sig   aspirin 81 MG tablet Take 1 tablet by mouth once a week.   glipiZIDE (GLUCOTROL) 5 MG tablet Take 1 tablet (5 mg total) by mouth daily before breakfast.   glucose blood (ONETOUCH ULTRA) test strip USE TO check blood glucose TWICE DAILY   ipratropium (ATROVENT) 0.06 % nasal spray Place 2 sprays each nostril up to 4 times a day for symptoms relief   JARDIANCE 10 MG TABS tablet TAKE ONE TABLET BY MOUTH EVERY MORNING   Lancets (ONETOUCH DELICA PLUS [id number]) MISC USE ONE TO CHECK GLUCOSE TWICE DAILY    levocetirizine (XYZAL) 5 MG tablet Take 1 tablet (5 mg total) by mouth every evening.   lisinopril (ZESTRIL) 10 MG tablet TAKE ONE TABLET BY MOUTH EVERY EVENING   metFORMIN (GLUCOPHAGE) 1000 MG tablet TAKE ONE TABLET BY MOUTH TWICE DAILY WITH A MEAL   nitroGLYCERIN (NITROSTAT) 0.4 MG SL tablet Take one tablet sublingual as needed for chest pain up to three doses as needed.   pioglitazone (ACTOS) 45 MG tablet TAKE ONE TABLET BY MOUTH EVERY MORNING   rosuvastatin (CRESTOR) 20 MG tablet TAKE ONE TABLET BY MOUTH EVERYDAY AT BEDTIME   Semaglutide, 2 MG/DOSE, 8 MG/3ML SOPN Inject 2 mg as directed once a week.   tadalafil (CIALIS) 20 MG tablet TAKE ONE TABLET BY MOUTH every other DAY AS NEEDED   vitamin B-12 (CYANOCOBALAMIN) 1000 MCG tablet Take 1,000 mcg by mouth at bedtime.    No facility-administered encounter medications on file as of 10/12/2022.   BP Readings from Last 3 Encounters:  09/29/22 108/66  09/02/22 98/76  06/30/22 100/60    Pulse Readings from Last 3 Encounters:  09/29/22 67  09/02/22 82  06/30/22 66    Lab Results  Component Value Date/Time   HGBA1C 7.2 (A) 09/29/2022 09:30 AM   HGBA1C 7.3 (A) 06/30/2022 10:46 AM   HGBA1C 8.3 (H) 12/25/2015 08:39 AM   HGBA1C 8.4 10/14/2015 12:00 AM   HGBA1C 7.3 (H) 05/22/2015 09:53 AM   Lab Results  Component Value Date   CREATININE 1.6 (A) 08/28/2022   BUN 22  11/26/2021   GFR 42.05 (L) 11/26/2021   GFRNONAA 42 (L) 06/11/2020   GFRAA 48 (L) 06/11/2020   NA 138 08/28/2022   K 3.7 08/28/2022   CALCIUM 9.1 08/28/2022   CO2 24 (A) 08/28/2022     Ned Clines [place] Clinical Pharmacist Assistant [phone]

## 2022-10-13 ENCOUNTER — Encounter: Payer: Self-pay | Admitting: Internal Medicine

## 2022-10-13 ENCOUNTER — Ambulatory Visit (AMBULATORY_SURGERY_CENTER): Payer: Medicare Other | Admitting: Internal Medicine

## 2022-10-13 VITALS — BP 130/61 | HR 59 | Temp 96.6°F | Resp 13 | Ht 71.0 in | Wt 191.0 lb

## 2022-10-13 DIAGNOSIS — Z1211 Encounter for screening for malignant neoplasm of colon: Secondary | ICD-10-CM

## 2022-10-13 DIAGNOSIS — D123 Benign neoplasm of transverse colon: Secondary | ICD-10-CM | POA: Diagnosis not present

## 2022-10-13 MED ORDER — SODIUM CHLORIDE 0.9 % IV SOLN: 500.0000 mL | Freq: Once | INTRAVENOUS | Status: DC

## 2022-10-13 NOTE — Progress Notes (Signed)
[facility] History and Physical   Primary Care Physician:  [name], MD   Reason for Procedure:   CRCA screening  Plan:    colonoscopy     HPI: Lucas Warren is a 77 y.o. male for screening exam  Colonoscopy 05/2012 negative   Past Medical History:  Diagnosis Date   Acute URI 10/10/2012   Allergy    seasonal   Anxiety    Cataract    Chest pain, atypical    Colon polyps    hyperplastic 2003   DJD (degenerative joint disease)    DM (diabetes mellitus) ([place])    GERD (gastroesophageal reflux disease)    History of headache    Hypercholesteremia    Psoriasis     Past Surgical History:  Procedure Laterality Date   CATARACT EXTRACTION, BILATERAL Bilateral    Dr. Tommy Rainwater;    [place] CATH AND CORONARY ANGIOGRAPHY N/A 05/20/2020   Procedure: LEFT HEART CATH AND CORONARY ANGIOGRAPHY;  Surgeon: Jettie Booze, MD;  Location: [facility];  Service: Cardiovascular;  Laterality: N/A;   NASAL SEPTUM SURGERY  1989   SINOSCOPY     TONSILLECTOMY      Prior to Admission medications   Medication Sig Start Date End Date Taking? Authorizing Provider  glipiZIDE (GLUCOTROL) 5 MG tablet Take 1 tablet (5 mg total) by mouth daily before breakfast. 07/14/22  Yes Burchette, Alinda Sierras, MD  glucose blood (ONETOUCH ULTRA) test strip USE TO check blood glucose TWICE DAILY 09/21/22  Yes Burchette, Alinda Sierras, MD  JARDIANCE 10 MG TABS tablet TAKE ONE TABLET BY MOUTH EVERY MORNING 09/11/22  Yes Burchette, Alinda Sierras, MD  Lancets (ONETOUCH DELICA PLUS 123XX123) MISC USE ONE TO CHECK GLUCOSE TWICE DAILY 09/14/22  Yes Burchette, Alinda Sierras, MD  lisinopril (ZESTRIL) 10 MG tablet TAKE ONE TABLET BY MOUTH EVERY EVENING 05/14/22  Yes Burchette, Alinda Sierras, MD  metFORMIN (GLUCOPHAGE) 1000 MG tablet TAKE ONE TABLET BY MOUTH TWICE DAILY WITH A MEAL 09/11/22  Yes Burchette, Alinda Sierras, MD  pioglitazone (ACTOS) 45 MG tablet TAKE ONE TABLET BY  MOUTH EVERY MORNING 05/14/22  Yes Burchette, Alinda Sierras, MD  rosuvastatin (CRESTOR) 20 MG tablet TAKE ONE TABLET BY MOUTH EVERYDAY AT BEDTIME 07/13/22  Yes Jettie Booze, MD  vitamin B-12 (CYANOCOBALAMIN) 1000 MCG tablet Take 1,000 mcg by mouth at bedtime.    Yes [provider]  aspirin 81 MG tablet Take 1 tablet by mouth once a week.    [provider]  ipratropium (ATROVENT) 0.06 % nasal spray Place 2 sprays each nostril up to 4 times a day for symptoms relief 05/06/22   Kennith Gain, MD  levocetirizine (XYZAL) 5 MG tablet Take 1 tablet (5 mg total) by mouth every evening. 05/29/22   Roney Marion, MD  nitroGLYCERIN (NITROSTAT) 0.4 MG SL tablet Take one tablet sublingual as needed for chest pain up to three doses as needed. Patient not taking: Reported on 10/13/2022 05/03/20   [name], MD  Semaglutide, 2 MG/DOSE, 8 MG/3ML SOPN Inject 2 mg as directed once a week. 12/15/21   Burchette, Alinda Sierras, MD  tadalafil (CIALIS) 20 MG tablet TAKE ONE TABLET BY MOUTH every other DAY AS NEEDED 05/19/21   Burchette, Alinda Sierras, MD    Current Outpatient Medications  Medication Sig Dispense Refill   glipiZIDE (GLUCOTROL) 5 MG tablet Take 1 tablet (5 mg total) by  mouth daily before breakfast. 90 tablet 3   glucose blood (ONETOUCH ULTRA) test strip USE TO check blood glucose TWICE DAILY 180 strip 0   JARDIANCE 10 MG TABS tablet TAKE ONE TABLET BY MOUTH EVERY MORNING 90 tablet 3   Lancets (ONETOUCH DELICA PLUS 123XX123) MISC USE ONE TO CHECK GLUCOSE TWICE DAILY 180 each 3   lisinopril (ZESTRIL) 10 MG tablet TAKE ONE TABLET BY MOUTH EVERY EVENING 90 tablet 3   metFORMIN (GLUCOPHAGE) 1000 MG tablet TAKE ONE TABLET BY MOUTH TWICE DAILY WITH A MEAL 180 tablet 3   pioglitazone (ACTOS) 45 MG tablet TAKE ONE TABLET BY MOUTH EVERY MORNING 90 tablet 3   rosuvastatin (CRESTOR) 20 MG tablet TAKE ONE TABLET BY MOUTH EVERYDAY AT BEDTIME 90 tablet 2   vitamin B-12 (CYANOCOBALAMIN) 1000  MCG tablet Take 1,000 mcg by mouth at bedtime.      aspirin 81 MG tablet Take 1 tablet by mouth once a week.     ipratropium (ATROVENT) 0.06 % nasal spray Place 2 sprays each nostril up to 4 times a day for symptoms relief 15 mL 5   levocetirizine (XYZAL) 5 MG tablet Take 1 tablet (5 mg total) by mouth every evening. 90 tablet 1   nitroGLYCERIN (NITROSTAT) 0.4 MG SL tablet Take one tablet sublingual as needed for chest pain up to three doses as needed. (Patient not taking: Reported on 10/13/2022) 20 tablet 0   Semaglutide, 2 MG/DOSE, 8 MG/3ML SOPN Inject 2 mg as directed once a week. 3 mL 11   tadalafil (CIALIS) 20 MG tablet TAKE ONE TABLET BY MOUTH every other DAY AS NEEDED 30 tablet 3   Current Facility-Administered Medications  Medication Dose Route Frequency Provider Last Rate Last Admin   0.9 %  sodium chloride infusion  500 mL Intravenous Once Gatha Mayer, MD        Allergies as of 10/13/2022 - Review Complete 10/13/2022  Allergen Reaction Noted   Lisinopril Cough 08/19/2016   Erythromycin     Trulicity [dulaglutide] Nausea Only 03/25/2016    Family History  Problem Relation Age of Onset   Lung cancer Mother    Alzheimer's disease Father    Heart disease Father    Leukemia Sister    Allergic rhinitis Neg Hx    Asthma Neg Hx    Eczema Neg Hx    Urticaria Neg Hx    Colon cancer Neg Hx    Colon polyps Neg Hx    Esophageal cancer Neg Hx    Rectal cancer Neg Hx    Stomach cancer Neg Hx     Social History   Socioeconomic History   Marital status: Divorced    Spouse name: carolyn   Number of children: Not on file   Years of education: Not on file   Highest education level: Not on file  Occupational History    Comment: Patent examiner  Tobacco Use   Smoking status: Former    Packs/day: 2.00    Years: 20.00    Total pack years: 40.00    Types: Cigarettes    Quit date: 08/31/1981    Years since quitting: 41.1   Smokeless tobacco: Never  Vaping Use   Vaping  Use: Never used  Substance and Sexual Activity   Alcohol use: Yes    Comment: rarely a beer   Drug use: Never   Sexual activity: Not Currently  Other Topics Concern   Not on file  Social History Narrative  Not on file   Social Determinants of Health   Financial Resource Strain: Low Risk  (05/26/2022)   Overall Financial Resource Strain (CARDIA)    Difficulty of Paying Living Expenses: Not very hard  Food Insecurity: No Food Insecurity (04/30/2022)   Hunger Vital Sign    Worried About Running Out of Food in the Last Year: Never true    Ran Out of Food in the Last Year: Never true  Transportation Needs: No Transportation Needs (04/30/2022)   PRAPARE - Hydrologist (Medical): No    Lack of Transportation (Non-Medical): No  Physical Activity: Inactive (04/30/2022)   Exercise Vital Sign    Days of Exercise per Week: 0 days    Minutes of Exercise per Session: 0 min  Stress: No Stress Concern Present (04/30/2022)   [place]    Feeling of Stress : Not at all  Social Connections: Moderately Integrated (04/30/2022)   Social Connection and Isolation Panel [NHANES]    Frequency of Communication with Friends and Family: More than three times a week    Frequency of Social Gatherings with Friends and Family: More than three times a week    Attends Religious Services: More than 4 times per year    Active Member of [employer] or Organizations: Yes    Attends Music therapist: More than 4 times per year    Marital Status: Divorced  Intimate Partner Violence: Not At Risk (04/30/2022)   Humiliation, Afraid, Rape, and Kick questionnaire    Fear of Current or Ex-Partner: No    Emotionally Abused: No    Physically Abused: No    Sexually Abused: No    Review of Systems:  All other review of systems negative except as mentioned in the HPI.  Physical Exam: Vital signs BP 124/81   Pulse 64    Temp (!) 96.6 F (35.9 C) (Skin)   Ht 5' 11"$  (1.803 m)   Wt 191 lb (86.6 kg)   SpO2 99%   BMI 26.64 kg/m   General:   Alert,  Well-developed, well-nourished, pleasant and cooperative in NAD Lungs:  Clear throughout to auscultation.   Heart:  Regular rate and rhythm; no murmurs, clicks, rubs,  or gallops. Abdomen:  Soft, nontender and nondistended. Normal bowel sounds.   Neuro/Psych:  Alert and cooperative. Normal mood and affect. A and O x 3   @Zilpha Mcandrew$  Simonne Maffucci, MD, [facility] [phone] (pager) 10/13/2022 12:00 PM@

## 2022-10-13 NOTE — Progress Notes (Signed)
Sedate, gd SR, tolerated procedure well, VSS, report to RN 

## 2022-10-13 NOTE — Op Note (Signed)
[place] Patient Name: Lucas Warren Procedure Date: 10/13/2022 11:58 AM MRN: [id number] Endoscopist: Gatha Mayer , MD, [phone] Age: 77 Referring MD:  Date of Birth: 03/06/1946 Gender: Male Account #: [id number] Procedure:                Colonoscopy Indications:              Screening for colorectal malignant neoplasm, Last                            colonoscopy: October 2013 Medicines:                Monitored Anesthesia Care Procedure:                Pre-Anesthesia Assessment:                           - Prior to the procedure, a History and Physical                            was performed, and patient medications and                            allergies were reviewed. The patient's tolerance of                            previous anesthesia was also reviewed. The risks                            and benefits of the procedure and the sedation                            options and risks were discussed with the patient.                            All questions were answered, and informed consent                            was obtained. Prior Anticoagulants: The patient has                            taken no anticoagulant or antiplatelet agents. ASA                            Grade Assessment: II - A patient with mild systemic                            disease. After reviewing the risks and benefits,                            the patient was deemed in satisfactory condition to                            undergo the procedure.  After obtaining informed consent, the colonoscope                            was passed under direct vision. Throughout the                            procedure, the patient's blood pressure, pulse, and                            oxygen saturations were monitored continuously. The                            Olympus SN [id number] was introduced through the anus                            and advanced to the the cecum,  identified by                            appendiceal orifice and ileocecal valve. The                            colonoscopy was performed without difficulty. The                            patient tolerated the procedure well. The quality                            of the bowel preparation was good. The ileocecal                            valve, appendiceal orifice, and rectum were                            photographed. The bowel preparation used was SUPREP                            via split dose instruction. Scope In: 12:10:38 PM Scope Out: 12:25:10 PM Scope Withdrawal Time: 0 hours 11 minutes 54 seconds  Total Procedure Duration: 0 hours 14 minutes 32 seconds  Findings:                 The perianal and digital rectal examinations were                            normal. Pertinent negatives include normal prostate                            (size, shape, and consistency).                           A diminutive polyp was found in the transverse                            colon. The polyp was sessile. The polyp was removed  with a cold snare. Resection and retrieval were                            complete. Verification of patient identification                            for the specimen was done. Estimated blood loss was                            minimal.                           Many diverticula were found in the sigmoid colon.                           The exam was otherwise without abnormality on                            direct and retroflexion views. Complications:            No immediate complications. Estimated Blood Loss:     Estimated blood loss was minimal. Impression:               - One diminutive polyp in the transverse colon,                            removed with a cold snare. Resected and retrieved.                           - Diverticulosis in the sigmoid colon.                           - The examination was otherwise normal on direct                             and retroflexion views. Recommendation:           - Patient has a contact number available for                            emergencies. The signs and symptoms of potential                            delayed complications were discussed with the                            patient. Return to normal activities tomorrow.                            Written discharge instructions were provided to the                            patient.                           - Resume previous diet.                           -  Continue present medications.                           - Await pathology results.                           - No recommendation at this time regarding repeat                            colonoscopy due to age. Gatha Mayer, MD 10/13/2022 12:33:56 PM This report has been signed electronically.

## 2022-10-13 NOTE — Patient Instructions (Addendum)
I found and removed one tiny polyp. You also have a condition called diverticulosis - common and not usually a problem. Please read the handout provided.  I will have the polyp analyzed and let you know the results. I do not think I will recommend another routine colonoscopy in your case (age).  I appreciate the opportunity to care for you. Gatha Mayer, MD, [facility]   Await pathology results.  Handouts on polyps and diverticulosis provided.  See MD's procedure report for recommendations.   YOU HAD AN ENDOSCOPIC PROCEDURE TODAY AT [facility]:   Refer to the procedure report that was given to you for any specific questions about what was found during the examination.  If the procedure report does not answer your questions, please call your gastroenterologist to clarify.  If you requested that your care partner not be given the details of your procedure findings, then the procedure report has been included in a sealed envelope for you to review at your convenience later.  YOU SHOULD EXPECT: Some feelings of bloating in the abdomen. Passage of more gas than usual.  Walking can help get rid of the air that was put into your GI tract during the procedure and reduce the bloating. If you had a lower endoscopy (such as a colonoscopy or flexible sigmoidoscopy) you may notice spotting of blood in your stool or on the toilet paper. If you underwent a bowel prep for your procedure, you may not have a normal bowel movement for a few days.  Please Note:  You might notice some irritation and congestion in your nose or some drainage.  This is from the oxygen used during your procedure.  There is no need for concern and it should clear up in a day or so.  SYMPTOMS TO REPORT IMMEDIATELY:  Following lower endoscopy (colonoscopy or flexible sigmoidoscopy):  Excessive amounts of blood in the stool  Significant tenderness or worsening of abdominal pains  Swelling of the abdomen that is new,  acute  Fever of 100F or higher   For urgent or emergent issues, a gastroenterologist can be reached at any hour by calling [phone]. Do not use MyChart messaging for urgent concerns.    DIET:  We do recommend a small meal at first, but then you may proceed to your regular diet.  Drink plenty of fluids but you should avoid alcoholic beverages for 24 hours.  ACTIVITY:  You should plan to take it easy for the rest of today and you should NOT DRIVE or use heavy machinery until tomorrow (because of the sedation medicines used during the test).    FOLLOW UP: Our staff will call the number listed on your records the next business day following your procedure.  We will call around 7:15- 8:00 am to check on you and address any questions or concerns that you may have regarding the information given to you following your procedure. If we do not reach you, we will leave a message.     If any biopsies were taken you will be contacted by phone or by letter within the next 1-3 weeks.  Please call us at [phone] if you have not heard about the biopsies in 3 weeks.    SIGNATURES/CONFIDENTIALITY: You and/or your care partner have signed paperwork which will be entered into your electronic medical record.  These signatures attest to the fact that that the information above on your After Visit Summary has been reviewed and is understood.  Full  responsibility of the confidentiality of this discharge information lies with you and/or your care-partner.

## 2022-10-13 NOTE — Progress Notes (Signed)
Pt's states no medical or surgical changes since previsit or office visit. 

## 2022-10-13 NOTE — Progress Notes (Signed)
Called to room to assist during endoscopic procedure.  Patient ID and intended procedure confirmed with present staff. Received instructions for my participation in the procedure from the performing physician.  

## 2022-10-14 ENCOUNTER — Telehealth: Payer: Self-pay | Admitting: *Deleted

## 2022-10-14 NOTE — Telephone Encounter (Signed)
  Follow up Call-     10/13/2022   11:14 AM  Call back number  Post procedure Call Back phone  # [phone]  Permission to leave phone message Yes     Patient questions:  Do you have a fever, pain , or abdominal swelling? No. Pain Score  0 *  Have you tolerated food without any problems? Yes.    Have you been able to return to your normal activities? Yes.    Do you have any questions about your discharge instructions: Diet   No. Medications  No. Follow up visit  No.  Do you have questions or concerns about your Care? No.  Actions: * If pain score is 4 or above: No action needed, pain <4.

## 2022-10-24 ENCOUNTER — Encounter: Payer: Self-pay | Admitting: Internal Medicine

## 2022-10-30 IMAGING — DX DG CHEST 2V
2 series · 2 of 2 positions shown · non-contrast
Comparison: Two-view chest x-ray 09/10/2020

CLINICAL DATA: Productive cough

EXAM:
CHEST - 2 VIEW

[chest pa]
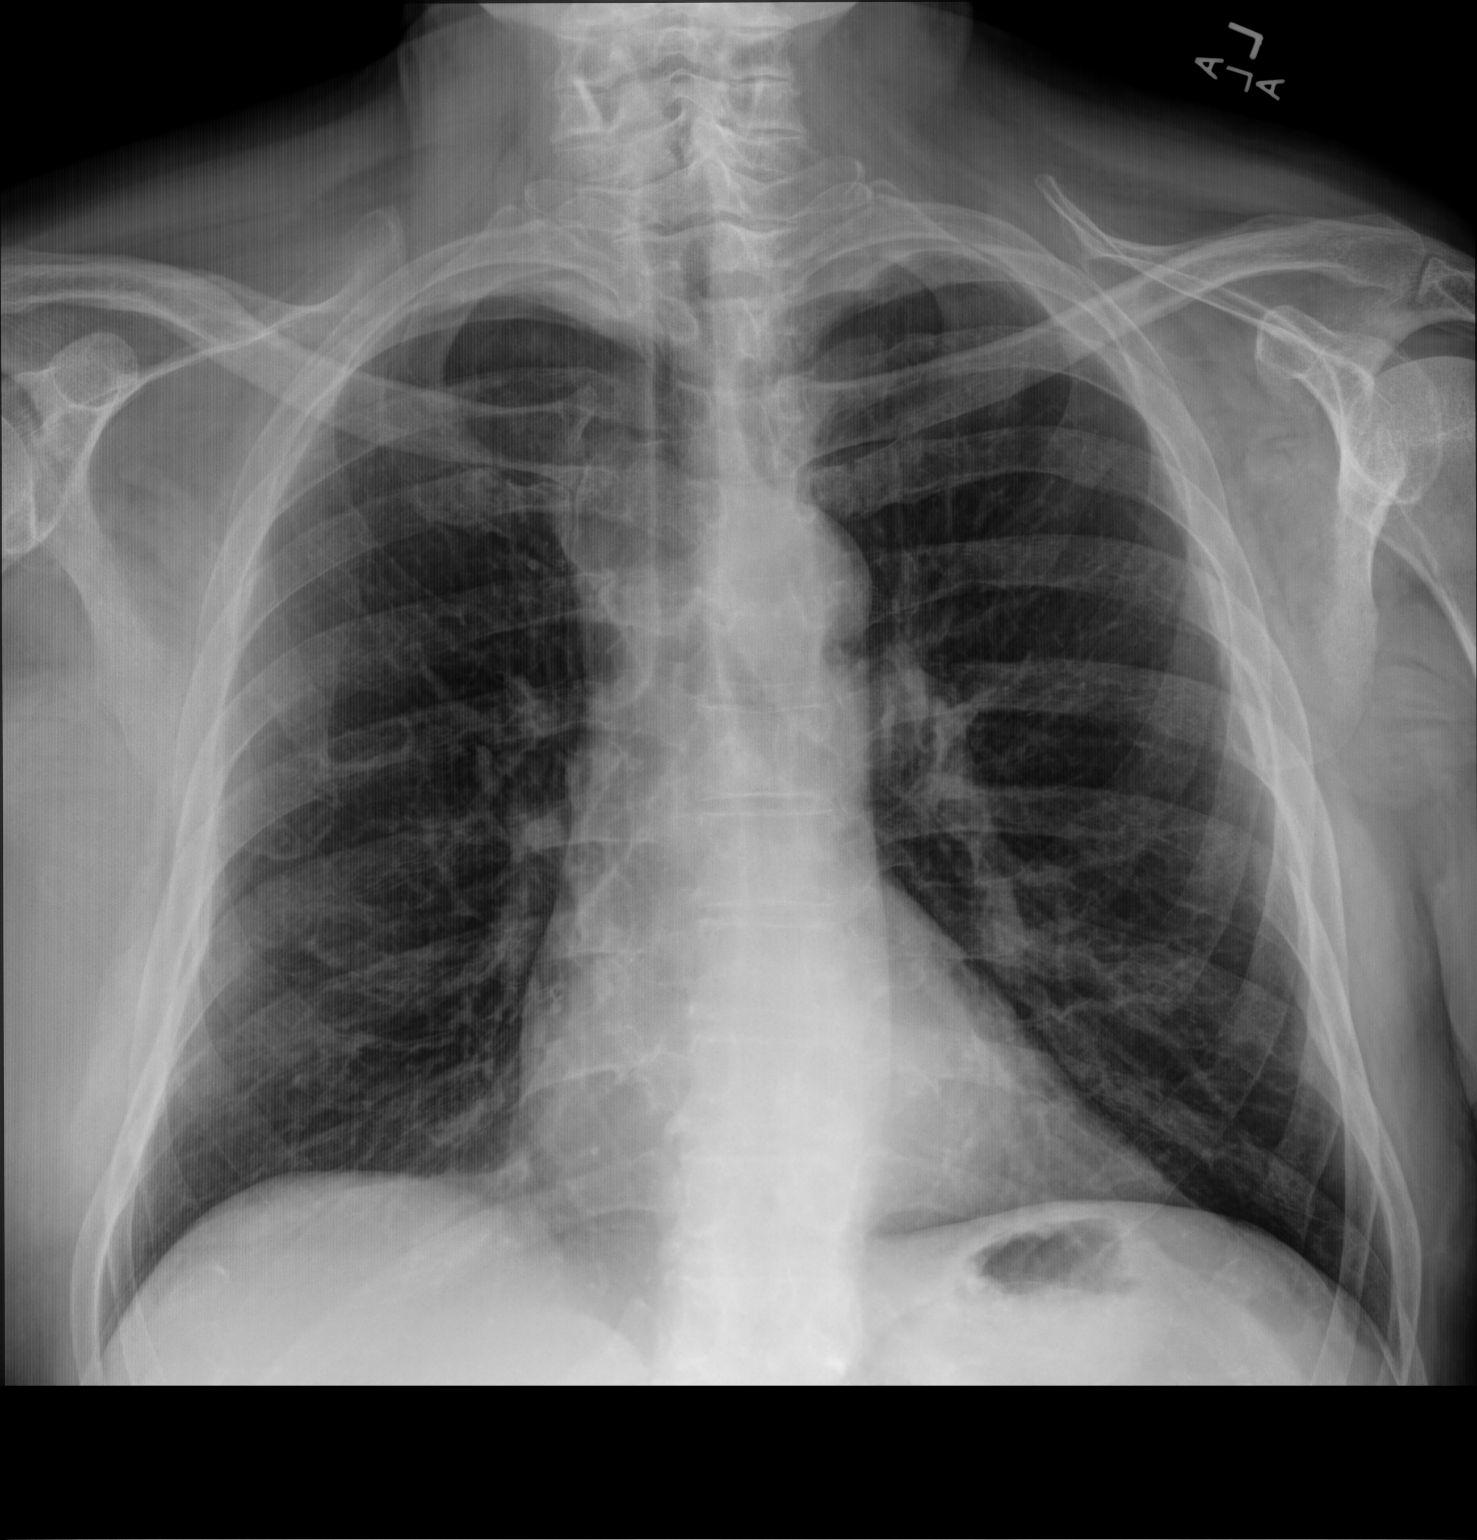

[chest lat]
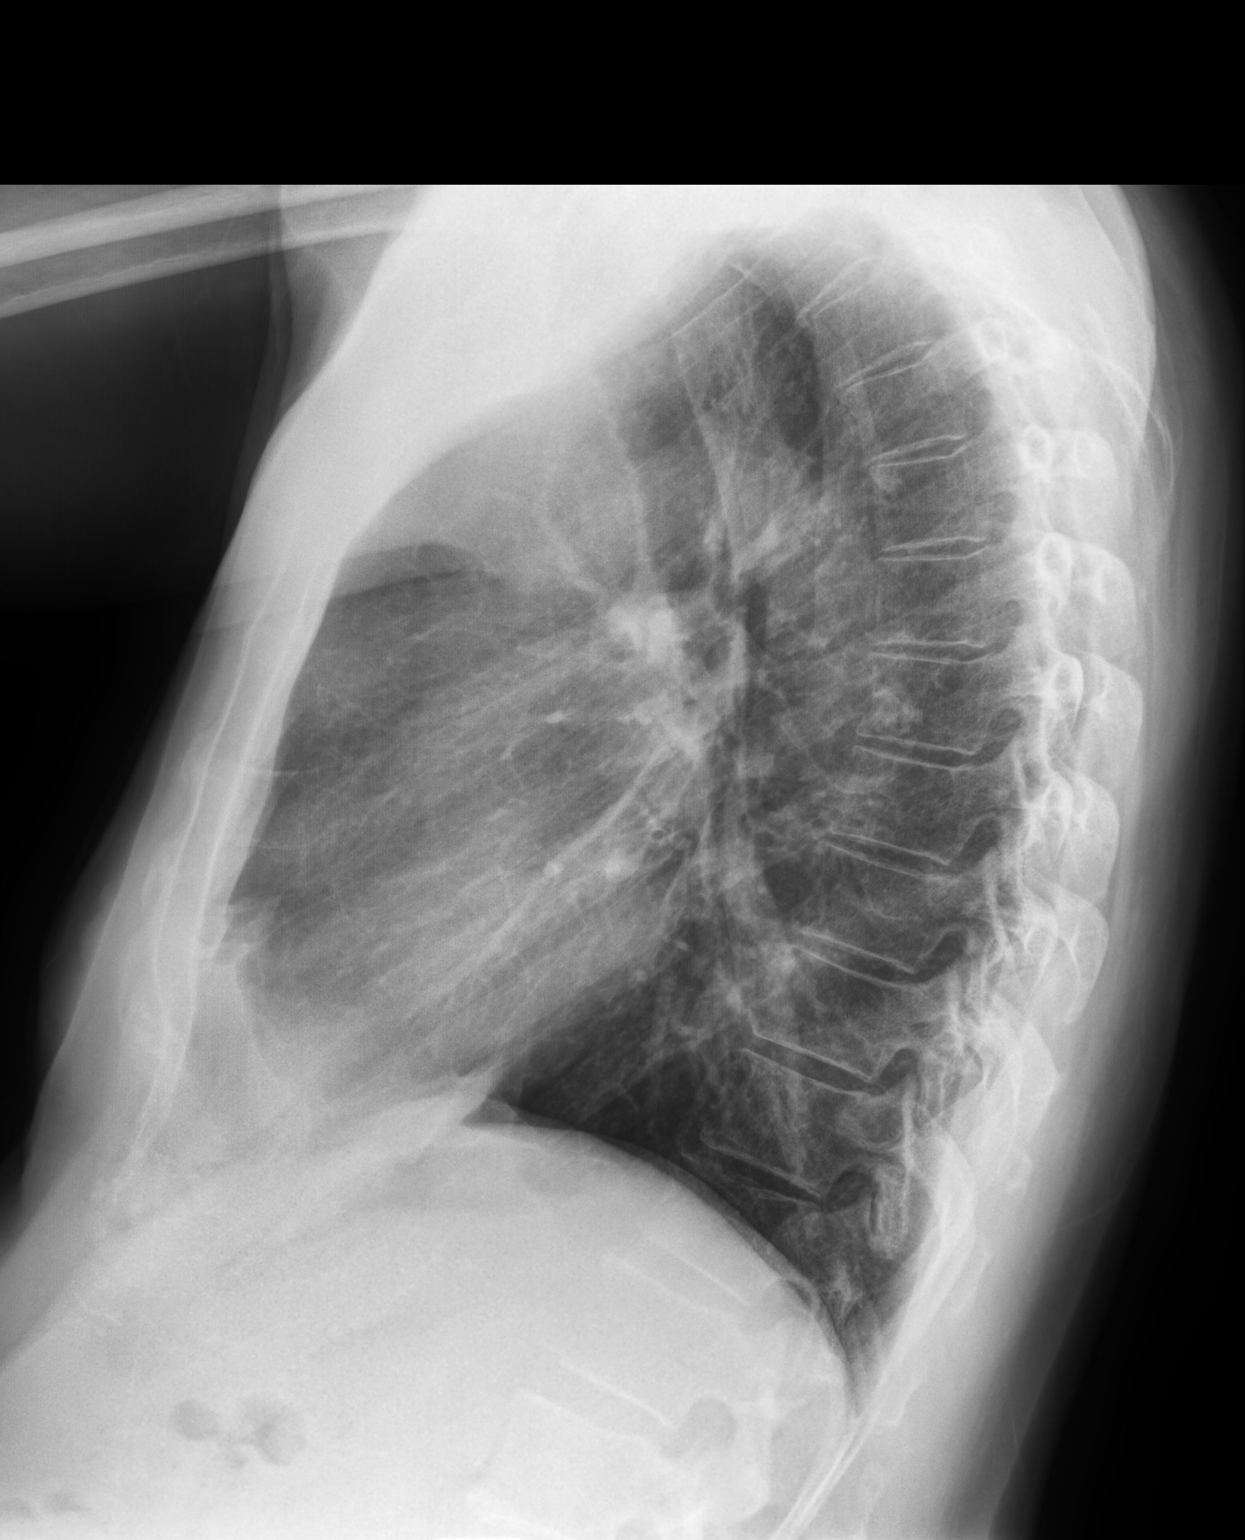

[2 of 2 positions shown; findings below may reference images not displayed]

FINDINGS: Heart size normal. Chronic interstitial coarsening is stable. No
edema or effusion is present. Visualized soft tissues and bony
thorax are unremarkable.
IMPRESSION: No acute cardiopulmonary disease or significant interval change.

## 2022-11-10 ENCOUNTER — Other Ambulatory Visit: Payer: Self-pay | Admitting: Family Medicine

## 2022-11-11 ENCOUNTER — Telehealth: Payer: Self-pay

## 2022-11-11 NOTE — Progress Notes (Signed)
Patient ID: Lucas Warren, male   DOB: [birth year], 77 y.o.   MRN: [id number]  Care Management & Coordination Services Pharmacy Team  Reason for Encounter: Medication coordination and delivery  Contacted patient to discuss medications and coordinate delivery from [facility]. Spoke with patient on 11/11/2022  Cycle dispensing form sent to [facility] for review.   Last adherence delivery date:10/22/22      Patient is due for next adherence delivery on: 11/23/22  This delivery to include: Adherence Packaging  30 Days  Empagliflozin (Jardiance) 10 mg: one tablet at breakfast  Glipizide 5 mg: one tab at breakfast  Rosuvastatin 20 mg: one tablet at bedtime Metformin (Glucophage) 1000 mg: one tablet at breakfast and one at dinner Lisinopril (Zestril) 10 mg: one tablet at dinner Pioglitazone (Actos) 45 mg: one tablet at breakfast Levocetirizine 5 mg: one tab at dinner   Patient declined the following medications this month: Test strips Lancets    Atrovent Nasal Spray Added to this order  Confirmed delivery date of 11/23/22, advised patient that pharmacy will contact them the morning of delivery.   Any concerns about your medications? No  How often do you forget or accidentally miss a dose? Never  Do you use a pillbox? No  Is patient in packaging Yes   Chart review: Recent office visits:  none  Recent consult visits:  none  Hospital visits:  None in previous 6 months  Medications: Outpatient Encounter Medications as of 11/11/2022  Medication Sig Note   aspirin 81 MG tablet Take 1 tablet by mouth once a week.    glipiZIDE (GLUCOTROL) 5 MG tablet Take 1 tablet (5 mg total) by mouth daily before breakfast.    glucose blood (ONETOUCH ULTRA) test strip USE TO check blood glucose twice daily AS DIRECTED    ipratropium (ATROVENT) 0.06 % nasal spray Place 2 sprays each nostril up to 4 times a day for symptoms relief    JARDIANCE 10 MG TABS tablet TAKE ONE TABLET BY MOUTH  EVERY MORNING    Lancets (ONETOUCH DELICA PLUS 123XX123) MISC USE ONE TO CHECK GLUCOSE TWICE DAILY    levocetirizine (XYZAL) 5 MG tablet Take 1 tablet (5 mg total) by mouth every evening.    lisinopril (ZESTRIL) 10 MG tablet TAKE ONE TABLET BY MOUTH EVERY EVENING    metFORMIN (GLUCOPHAGE) 1000 MG tablet TAKE ONE TABLET BY MOUTH TWICE DAILY WITH A MEAL    nitroGLYCERIN (NITROSTAT) 0.4 MG SL tablet Take one tablet sublingual as needed for chest pain up to three doses as needed. (Patient not taking: Reported on 10/13/2022) 10/13/2022: Has never taken   pioglitazone (ACTOS) 45 MG tablet TAKE ONE TABLET BY MOUTH EVERY MORNING    rosuvastatin (CRESTOR) 20 MG tablet TAKE ONE TABLET BY MOUTH EVERYDAY AT BEDTIME    Semaglutide, 2 MG/DOSE, 8 MG/3ML SOPN Inject 2 mg as directed once a week.    tadalafil (CIALIS) 20 MG tablet TAKE ONE TABLET BY MOUTH every other DAY AS NEEDED    vitamin B-12 (CYANOCOBALAMIN) 1000 MCG tablet Take 1,000 mcg by mouth at bedtime.     No facility-administered encounter medications on file as of 11/11/2022.   BP Readings from Last 3 Encounters:  10/13/22 130/61  09/29/22 108/66  09/02/22 98/76    Pulse Readings from Last 3 Encounters:  10/13/22 (!) 59  09/29/22 67  09/02/22 82    Lab Results  Component Value Date/Time   HGBA1C 7.2 (A) 09/29/2022 09:30 AM   HGBA1C 7.3 (A)  06/30/2022 10:46 AM   HGBA1C 8.3 (H) 12/25/2015 08:39 AM   HGBA1C 8.4 10/14/2015 12:00 AM   HGBA1C 7.3 (H) 05/22/2015 09:53 AM   Lab Results  Component Value Date   CREATININE 1.6 (A) 08/28/2022   BUN 22 11/26/2021   GFR 42.05 (L) 11/26/2021   GFRNONAA 42 (L) 06/11/2020   GFRAA 48 (L) 06/11/2020   NA 138 08/28/2022   K 3.7 08/28/2022   CALCIUM 9.1 08/28/2022   CO2 24 (A) 08/28/2022       [place] Clinical Pharmacist Assistant [phone]

## 2022-12-08 ENCOUNTER — Other Ambulatory Visit: Payer: Self-pay | Admitting: *Deleted

## 2022-12-08 MED ORDER — LEVOCETIRIZINE DIHYDROCHLORIDE 5 MG PO TABS: 5.0000 mg | ORAL_TABLET | Freq: Every evening | ORAL | 1 refills | Status: DC

## 2022-12-10 ENCOUNTER — Telehealth: Payer: Self-pay

## 2022-12-10 NOTE — Progress Notes (Signed)
Patient ID: Lucas Warren, male   DOB: April 11, 1946, 77 y.o.   MRN: [id number]  Care Management & Coordination Services Pharmacy Team  Reason for Encounter: Medication coordination and delivery  Contacted patient to discuss medications and coordinate delivery from [facility]. Spoke with patient on 12/10/2022  Cycle dispensing form sent to [facility] for review.   Last adherence delivery date:11/11/22      Patient is due for next adherence delivery on: 12/22/22  This delivery to include: Adherence Packaging  30 Days  Empagliflozin (Jardiance) 10 mg: one tablet at breakfast  Glipizide 5 mg: one tab at breakfast  Rosuvastatin 20 mg: one tablet at bedtime Metformin (Glucophage) 1000 mg: one tablet at breakfast and one at dinner Lisinopril (Zestril) 10 mg: one tablet at dinner Pioglitazone (Actos) 45 mg: one tablet at breakfast Levocetirizine 5 mg: one tab at dinner  Atrovent Nasal Spray   Patient declined the following medications this month: Strips Lancets (due to abundance)  No refill request needed.  Confirmed delivery date of 12/22/22, advised patient that pharmacy will contact them the morning of delivery.   Any concerns about your medications? No  How often do you forget or accidentally miss a dose? Never  Do you use a pillbox? No  Is patient in packaging Yes   Chart review: Recent office visits:  None  Recent consult visits:  None  Hospital visits:  None in previous 6 months  Medications: Outpatient Encounter Medications as of 12/10/2022  Medication Sig Note   aspirin 81 MG tablet Take 1 tablet by mouth once a week.    glipiZIDE (GLUCOTROL) 5 MG tablet Take 1 tablet (5 mg total) by mouth daily before breakfast.    glucose blood (ONETOUCH ULTRA) test strip USE TO check blood glucose twice daily AS DIRECTED    ipratropium (ATROVENT) 0.06 % nasal spray Place 2 sprays each nostril up to 4 times a day for symptoms relief    JARDIANCE 10 MG TABS tablet TAKE ONE  TABLET BY MOUTH EVERY MORNING    Lancets (ONETOUCH DELICA PLUS LANCET33G) MISC USE ONE TO CHECK GLUCOSE TWICE DAILY    levocetirizine (XYZAL) 5 MG tablet Take 1 tablet (5 mg total) by mouth every evening.    lisinopril (ZESTRIL) 10 MG tablet TAKE ONE TABLET BY MOUTH EVERY EVENING    metFORMIN (GLUCOPHAGE) 1000 MG tablet TAKE ONE TABLET BY MOUTH TWICE DAILY WITH A MEAL    nitroGLYCERIN (NITROSTAT) 0.4 MG SL tablet Take one tablet sublingual as needed for chest pain up to three doses as needed. (Patient not taking: Reported on 10/13/2022) 10/13/2022: Has never taken   pioglitazone (ACTOS) 45 MG tablet TAKE ONE TABLET BY MOUTH EVERY MORNING    rosuvastatin (CRESTOR) 20 MG tablet TAKE ONE TABLET BY MOUTH EVERYDAY AT BEDTIME    Semaglutide, 2 MG/DOSE, 8 MG/3ML SOPN Inject 2 mg as directed once a week.    tadalafil (CIALIS) 20 MG tablet TAKE ONE TABLET BY MOUTH every other DAY AS NEEDED    vitamin B-12 (CYANOCOBALAMIN) 1000 MCG tablet Take 1,000 mcg by mouth at bedtime.     No facility-administered encounter medications on file as of 12/10/2022.   BP Readings from Last 3 Encounters:  10/13/22 130/61  09/29/22 108/66  09/02/22 98/76    Pulse Readings from Last 3 Encounters:  10/13/22 (!) 59  09/29/22 67  09/02/22 82    Lab Results  Component Value Date/Time   HGBA1C 7.2 (A) 09/29/2022 09:30 AM   HGBA1C 7.3 (  A) 06/30/2022 10:46 AM   HGBA1C 8.3 (H) 12/25/2015 08:39 AM   HGBA1C 8.4 10/14/2015 12:00 AM   HGBA1C 7.3 (H) 05/22/2015 09:53 AM   Lab Results  Component Value Date   CREATININE 1.6 (A) 08/28/2022   BUN 22 11/26/2021   GFR 42.05 (L) 11/26/2021   GFRNONAA 42 (L) 06/11/2020   GFRAA 48 (L) 06/11/2020   NA 138 08/28/2022   K 3.7 08/28/2022   CALCIUM 9.1 08/28/2022   CO2 24 (A) 08/28/2022       Pamala Duffel CMA Clinical Pharmacist Assistant [phone]

## 2022-12-29 ENCOUNTER — Ambulatory Visit (INDEPENDENT_AMBULATORY_CARE_PROVIDER_SITE_OTHER): Payer: Medicare Other | Admitting: Family Medicine

## 2022-12-29 ENCOUNTER — Ambulatory Visit: Payer: Medicare Other | Admitting: Family Medicine

## 2022-12-29 ENCOUNTER — Encounter: Payer: Self-pay | Admitting: Family Medicine

## 2022-12-29 VITALS — BP 118/70 | HR 68 | Temp 97.5°F | Wt 188.0 lb

## 2022-12-29 DIAGNOSIS — E1165 Type 2 diabetes mellitus with hyperglycemia: Secondary | ICD-10-CM

## 2022-12-29 DIAGNOSIS — E119 Type 2 diabetes mellitus without complications: Secondary | ICD-10-CM

## 2022-12-29 DIAGNOSIS — E669 Obesity, unspecified: Secondary | ICD-10-CM | POA: Diagnosis not present

## 2022-12-29 DIAGNOSIS — E7800 Pure hypercholesterolemia, unspecified: Secondary | ICD-10-CM | POA: Diagnosis not present

## 2022-12-29 DIAGNOSIS — I10 Essential (primary) hypertension: Secondary | ICD-10-CM

## 2022-12-29 LAB — POCT GLYCOSYLATED HEMOGLOBIN (HGB A1C): Hemoglobin A1C: 7 % — AB (ref 4.0–5.6)

## 2022-12-29 MED ORDER — SEMAGLUTIDE (2 MG/DOSE) 8 MG/3ML ~~LOC~~ SOPN: 2.0000 mg | PEN_INJECTOR | SUBCUTANEOUS | 2 refills | Status: DC

## 2022-12-29 NOTE — Addendum Note (Signed)
Addended by: Carola Rhine on: 12/29/2022 04:48 PM   Modules accepted: Orders

## 2022-12-29 NOTE — Progress Notes (Signed)
   Subjective:    Patient ID: Lucas Warren, male    DOB: 1945/11/27, 77 y.o.   MRN: [id number]  HPI Here to follow up on diabetes and HTN. At his last visit with Dr. Caryl Never on 09-29-22 his A1c was 7.2%, his BP was stable, and his LDL was down to 53. He continues to take Jardiance, Glipizide, Pioglitazone, and Metformin. He is also on Semaglutide shots. His A1c today is 7.0%. He feels well.    Review of Systems  Constitutional: Negative.   Respiratory: Negative.    Cardiovascular: Negative.        Objective:   Physical Exam Constitutional:      Appearance: Normal appearance.  Cardiovascular:     Rate and Rhythm: Normal rate and regular rhythm.     Pulses: Normal pulses.     Heart sounds: Normal heart sounds.  Pulmonary:     Effort: Pulmonary effort is normal.     Breath sounds: Normal breath sounds.  Neurological:     Mental Status: He is alert.           Assessment & Plan:  His diabetes, HTN, and hyperlipidemia are all well controlled. He will stay on the current regimen.  Gershon Crane, MD

## 2023-01-08 ENCOUNTER — Telehealth: Payer: Self-pay

## 2023-01-08 NOTE — Progress Notes (Unsigned)
Patient ID: Lucas Warren, male   DOB: 05/25/1946, 77 y.o.   MRN: [id number]   Care Management & Coordination Services Pharmacy Team  Reason for Encounter: Medication coordination and delivery  Contacted patient to discuss medications and coordinate delivery from [facility]. {US HC Outreach:28874} Cycle dispensing form sent to *** for review.   Last adherence delivery date:12/22/22      Patient is due for next adherence delivery on: 01/21/23  This delivery to include: Adherence Packaging  30 Days  Empagliflozin (Jardiance) 10 mg: one tablet at breakfast  Glipizide 5 mg: one tab at breakfast  Rosuvastatin 20 mg: one tablet at bedtime Metformin (Glucophage) 1000 mg: one tablet at breakfast and one at dinner Lisinopril (Zestril) 10 mg: one tablet at dinner Pioglitazone (Actos) 45 mg: one tablet at breakfast Levocetirizine 5 mg: one tab at dinner  Atrovent Nasal Spray  Patient declined the following medications this month: ***  {refills needed:25320}  {Delivery QMVH:84696}   Any concerns about your medications? {yes/no:20286}  How often do you forget or accidentally miss a dose? {Missed doses:25554}  Do you use a pillbox? {yes/no:20286}  Is patient in packaging {yes/no:20286}  If yes  What is the date on your next pill pack?  Any concerns or issues with your packaging?   Recent blood pressure readings are as follows:***  Recent blood glucose readings are as follows:***   Chart review: Recent office visits:  12/29/22 [name], MD - Patient presented for Poorly controlled type 2 diabetes mellitus and other concerns. No medication changes.   Recent consult visits:  None  Hospital visits:  None in previous 6 months  Medications: Outpatient Encounter Medications as of 01/08/2023  Medication Sig Note   aspirin 81 MG tablet Take 1 tablet by mouth once a week.    glipiZIDE (GLUCOTROL) 5 MG tablet Take 1 tablet (5 mg total) by mouth daily before breakfast.     glucose blood (ONETOUCH ULTRA) test strip USE TO check blood glucose twice daily AS DIRECTED    ipratropium (ATROVENT) 0.06 % nasal spray Place 2 sprays each nostril up to 4 times a day for symptoms relief    JARDIANCE 10 MG TABS tablet TAKE ONE TABLET BY MOUTH EVERY MORNING    Lancets (ONETOUCH DELICA PLUS LANCET33G) MISC USE ONE TO CHECK GLUCOSE TWICE DAILY    levocetirizine (XYZAL) 5 MG tablet Take 1 tablet (5 mg total) by mouth every evening.    lisinopril (ZESTRIL) 10 MG tablet TAKE ONE TABLET BY MOUTH EVERY EVENING    metFORMIN (GLUCOPHAGE) 1000 MG tablet TAKE ONE TABLET BY MOUTH TWICE DAILY WITH A MEAL    nitroGLYCERIN (NITROSTAT) 0.4 MG SL tablet Take one tablet sublingual as needed for chest pain up to three doses as needed. 10/13/2022: Has never taken   pioglitazone (ACTOS) 45 MG tablet TAKE ONE TABLET BY MOUTH EVERY MORNING    rosuvastatin (CRESTOR) 20 MG tablet TAKE ONE TABLET BY MOUTH EVERYDAY AT BEDTIME    Semaglutide, 2 MG/DOSE, 8 MG/3ML SOPN Inject 2 mg as directed once a week.    tadalafil (CIALIS) 20 MG tablet TAKE ONE TABLET BY MOUTH every other DAY AS NEEDED    vitamin B-12 (CYANOCOBALAMIN) 1000 MCG tablet Take 1,000 mcg by mouth at bedtime.     No facility-administered encounter medications on file as of 01/08/2023.   BP Readings from Last 3 Encounters:  12/29/22 118/70  10/13/22 130/61  09/29/22 108/66    Pulse Readings from Last 3 Encounters:  12/29/22 68  10/13/22 (!) 59  09/29/22 67    Lab Results  Component Value Date/Time   HGBA1C 7.0 (A) 12/29/2022 04:48 PM   HGBA1C 7.2 (A) 09/29/2022 09:30 AM   HGBA1C 8.3 (H) 12/25/2015 08:39 AM   HGBA1C 8.4 10/14/2015 12:00 AM   HGBA1C 7.3 (H) 05/22/2015 09:53 AM   Lab Results  Component Value Date   CREATININE 1.6 (A) 08/28/2022   BUN 22 11/26/2021   GFR 42.05 (L) 11/26/2021   GFRNONAA 42 (L) 06/11/2020   GFRAA 48 (L) 06/11/2020   NA 138 08/28/2022   K 3.7 08/28/2022   CALCIUM 9.1 08/28/2022   CO2 24 (A)  08/28/2022       Pamala Duffel CMA Clinical Pharmacist Assistant [phone]

## 2023-02-08 ENCOUNTER — Telehealth: Payer: Self-pay

## 2023-02-08 NOTE — Progress Notes (Signed)
Care Management & Coordination Services Pharmacy Team  Reason for Encounter: Medication coordination and delivery  Contacted patient to discuss medications and coordinate delivery from [facility]. Spoke with patient on 02/08/2023  Cycle dispensing form sent to [facility] for review.   Last adherence delivery date:01/21/23      Patient is due for next adherence delivery on: 02/19/23  This delivery to include: Adherence Packaging  30 Days  Empagliflozin (Jardiance) 10 mg: one tablet at breakfast  Glipizide 5 mg: one tab at breakfast  Rosuvastatin 20 mg: one tablet at bedtime Metformin (Glucophage) 1000 mg: one tablet at breakfast and one at dinner Lisinopril (Zestril) 10 mg: one tablet at dinner Pioglitazone (Actos) 45 mg: one tablet at breakfast Levocetirizine 5 mg: one tab at dinner   Patient declined the following medications this month:  Atrovent Nasal Spray  Test strips and lancets added per pt.  Confirmed delivery date of 02/19/23, advised patient that pharmacy will contact them the morning of delivery.   Any concerns about your medications? No  How often do you forget or accidentally miss a dose? Never  Do you use a pillbox? No  Is patient in packaging Yes  If yes  What is the date on your next pill pack?  Any concerns or issues with your packaging?   Chart review: Recent office visits:  none  Recent consult visits:  none  Hospital visits:  None in previous 6 months  Medications: Outpatient Encounter Medications as of 02/08/2023  Medication Sig Note   aspirin 81 MG tablet Take 1 tablet by mouth once a week.    glipiZIDE (GLUCOTROL) 5 MG tablet Take 1 tablet (5 mg total) by mouth daily before breakfast.    glucose blood (ONETOUCH ULTRA) test strip USE TO check blood glucose twice daily AS DIRECTED    ipratropium (ATROVENT) 0.06 % nasal spray Place 2 sprays each nostril up to 4 times a day for symptoms relief    JARDIANCE 10 MG TABS tablet TAKE ONE  TABLET BY MOUTH EVERY MORNING    Lancets (ONETOUCH DELICA PLUS LANCET33G) MISC USE ONE TO CHECK GLUCOSE TWICE DAILY    levocetirizine (XYZAL) 5 MG tablet Take 1 tablet (5 mg total) by mouth every evening.    lisinopril (ZESTRIL) 10 MG tablet TAKE ONE TABLET BY MOUTH EVERY EVENING    metFORMIN (GLUCOPHAGE) 1000 MG tablet TAKE ONE TABLET BY MOUTH TWICE DAILY WITH A MEAL    nitroGLYCERIN (NITROSTAT) 0.4 MG SL tablet Take one tablet sublingual as needed for chest pain up to three doses as needed. 10/13/2022: Has never taken   pioglitazone (ACTOS) 45 MG tablet TAKE ONE TABLET BY MOUTH EVERY MORNING    rosuvastatin (CRESTOR) 20 MG tablet TAKE ONE TABLET BY MOUTH EVERYDAY AT BEDTIME    Semaglutide, 2 MG/DOSE, 8 MG/3ML SOPN Inject 2 mg as directed once a week.    tadalafil (CIALIS) 20 MG tablet TAKE ONE TABLET BY MOUTH every other DAY AS NEEDED    vitamin B-12 (CYANOCOBALAMIN) 1000 MCG tablet Take 1,000 mcg by mouth at bedtime.     No facility-administered encounter medications on file as of 02/08/2023.   BP Readings from Last 3 Encounters:  12/29/22 118/70  10/13/22 130/61  09/29/22 108/66    Pulse Readings from Last 3 Encounters:  12/29/22 68  10/13/22 (!) 59  09/29/22 67    Lab Results  Component Value Date/Time   HGBA1C 7.0 (A) 12/29/2022 04:48 PM   HGBA1C 7.2 (A) 09/29/2022 09:30 AM  HGBA1C 8.3 (H) 12/25/2015 08:39 AM   HGBA1C 8.4 10/14/2015 12:00 AM   HGBA1C 7.3 (H) 05/22/2015 09:53 AM   Lab Results  Component Value Date   CREATININE 1.6 (A) 08/28/2022   BUN 22 11/26/2021   GFR 42.05 (L) 11/26/2021   GFRNONAA 42 (L) 06/11/2020   GFRAA 48 (L) 06/11/2020   NA 138 08/28/2022   K 3.7 08/28/2022   CALCIUM 9.1 08/28/2022   CO2 24 (A) 08/28/2022     Star Rating Drugs:  Jardiance 10 mg - Last filled 01/19/23 30 DS at [facility] Glipizide 5 mg - Last filled 01/19/23 30 DS at [facility] Rosuvastatin 20 mg - Last filled 01/19/23 30 DS at [facility] Metformin 1000 mg - Last filled 01/19/23  30 DS at [facility] Pioglitazone 45 mg - Last filled 01/19/23 30 DS at [facility] Lisinopril 10 mg -  01/19/23 30 DS at [facility] - Patient assistance  Care Gaps: Zoster Vaccine - Overdue COVID Booster - Overdue Eye Exam - Overdue Foot Exam - Overdue PNA Vaccine - Postponed AWV - 05/01/23    Pamala Duffel CMA Clinical Pharmacist Assistant [phone]

## 2023-02-09 NOTE — Progress Notes (Unsigned)
Care Management & Coordination Services Pharmacy Note  02/09/2023 Name:  Lucas Warren MRN:  [id number] DOB:  Jan 09, 1946  Summary: ***  Recommendations/Changes made from today's visit: ***  Follow up plan: ***   Subjective: Lucas Warren is an 77 y.o. year old male who is a primary patient of Lucas Warren, Lucas Fortis, MD.  The care coordination team was consulted for assistance with disease management and care coordination needs.    {CCMTELEPHONEFACETOFACE:21091510} for follow up visit.  Recent office visits: 12/29/22 Lucas Salisbury, MD - Patient presented for Poorly controlled type 2 diabetes mellitus and other concerns. No medication changes.   09/29/22 Lucas Warren, Lucas Fortis, MD - Patient presented for Poorly controlled type 2 diabetes mellitus and other concerns. Stopped Lomotil. Stopped Flagyl.   09/02/22 Lucas Salisbury, MD - Patient presented for Enteritis. Prescribed Ciprofloxacin. Prescribed Lomotil . Prescribed Flagyl   Recent consult visits: 12/29//23 Lucas Warren (Urgent Care) - Patient presented for stomach pain and diarrhea. No medication changes.   Hospital visits: None in previous 6 months   Objective:  Lab Results  Component Value Date   CREATININE 1.6 (A) 08/28/2022   BUN 22 11/26/2021   GFR 42.05 (L) 11/26/2021   EGFR 45 08/28/2022   GFRNONAA 42 (L) 06/11/2020   GFRAA 48 (L) 06/11/2020   NA 138 08/28/2022   K 3.7 08/28/2022   CALCIUM 9.1 08/28/2022   CO2 24 (A) 08/28/2022   GLUCOSE 241 (H) 11/26/2021    Lab Results  Component Value Date/Time   HGBA1C 7.0 (A) 12/29/2022 04:48 PM   HGBA1C 7.2 (A) 09/29/2022 09:30 AM   HGBA1C 8.3 (H) 12/25/2015 08:39 AM   HGBA1C 8.4 10/14/2015 12:00 AM   HGBA1C 7.3 (H) 05/22/2015 09:53 AM   GFR 42.05 (L) 11/26/2021 11:14 AM   GFR 46.97 (L) 09/15/2021 08:57 AM   MICROALBUR 12.1 (H) 06/30/2022 11:24 AM   MICROALBUR 2.5 06/11/2020 08:23 AM    Last diabetic Eye exam:  Lab Results  Component Value Date/Time    HMDIABEYEEXA No Retinopathy 04/02/2020 12:00 AM    Last diabetic Foot exam:  Lab Results  Component Value Date/Time   HMDIABFOOTEX normal 03/25/2016 12:00 AM     Lab Results  Component Value Date   CHOL 126 09/29/2022   HDL 51.80 09/29/2022   LDLCALC 53 09/29/2022   TRIG 106.0 09/29/2022   CHOLHDL 2 09/29/2022       Latest Ref Rng & Units 08/28/2022   12:00 AM 11/26/2021   11:14 AM 09/15/2021    8:57 AM  Hepatic Function  Total Protein 6.0 - 8.3 g/dL  7.2  7.1   Albumin 3.5 - 5.0 4.1     4.5  4.3   AST 14 - 40 11     11  10    ALT 10 - 40 U/L 7     9  6    Alk Phosphatase 39 - 117 U/L  44  39   Total Bilirubin 0.2 - 1.2 mg/dL  0.4  0.4   Bilirubin, Direct 0.0 - 0.3 mg/dL   0.1      This result is from an external source.    Lab Results  Component Value Date/Time   TSH 0.71 11/26/2021 11:14 AM   TSH 0.86 07/24/2015 12:35 PM       Latest Ref Rng & Units 11/26/2021   11:14 AM 05/15/2020   10:45 AM 11/11/2018   12:56 PM  CBC  WBC 4.0 - 10.5 K/uL 7.4  8.3  9.1   Hemoglobin 13.0 - 17.0 g/dL 16.1  09.6  04.5   Hematocrit 39.0 - 52.0 % 35.7  38.6  42.0   Platelets 150.0 - 400.0 K/uL 218.0  227  222.0     Lab Results  Component Value Date/Time   [id number] 336 07/24/2015 12:35 PM    Clinical ASCVD: No  The ASCVD Risk score (Arnett DK, et al., 2019) failed to calculate for the following reasons:   The valid total cholesterol range is 130 to 320 mg/dL       04/28/5620   30:86 AM 09/02/2022    3:34 PM 04/30/2022    9:29 AM  Depression screen PHQ 2/9  Decreased Interest 0 2 0  Down, Depressed, Hopeless 0 0 0  PHQ - 2 Score 0 2 0  Altered sleeping 0 0   Tired, decreased energy 0 3   Change in appetite 0 3   Feeling bad or failure about yourself  0 0   Trouble concentrating 0 1   Moving slowly or fidgety/restless 0 0   Suicidal thoughts 0 0   PHQ-9 Score 0 9   Difficult doing work/chores Not difficult at all Somewhat difficult      Social History   Tobacco Use   Smoking Status Former   Packs/day: 2.00   Years: 20.00   Additional pack years: 0.00   Total pack years: 40.00   Types: Cigarettes   Quit date: 08/31/1981   Years since quitting: 41.4  Smokeless Tobacco Never   BP Readings from Last 3 Encounters:  12/29/22 118/70  10/13/22 130/61  09/29/22 108/66   Pulse Readings from Last 3 Encounters:  12/29/22 68  10/13/22 (!) 59  09/29/22 67   Wt Readings from Last 3 Encounters:  12/29/22 188 lb (85.3 kg)  10/13/22 191 lb (86.6 kg)  09/29/22 186 lb (84.4 kg)   BMI Readings from Last 3 Encounters:  12/29/22 26.22 kg/m  10/13/22 26.64 kg/m  09/29/22 25.94 kg/m    Allergies  Allergen Reactions   Lisinopril Cough   Erythromycin     REACTION: abd pain   Trulicity [Dulaglutide] Nausea Only    Medications Reviewed Today     Reviewed by Lucas Salisbury, MD (Physician) on 12/29/22 at [id number]  Med List Status: <None>   Medication Order Taking? Sig Documenting Provider Last Dose Status Informant  aspirin 81 MG tablet [id number] Yes Take 1 tablet by mouth once a week. [provider] Taking Active Self  glipiZIDE (GLUCOTROL) 5 MG tablet [id number] Yes Take 1 tablet (5 mg total) by mouth daily before breakfast. Lucas Covey, MD Taking Active   glucose blood (ONETOUCH ULTRA) test strip [id number] Yes USE TO check blood glucose twice daily AS DIRECTED Lucas Warren, Lucas Fortis, MD Taking Active   ipratropium (ATROVENT) 0.06 % nasal spray [id number] Yes Place 2 sprays each nostril up to 4 times a day for symptoms relief Lucas Bruins, MD Taking Active   JARDIANCE 10 MG TABS tablet [id number] Yes TAKE ONE TABLET BY MOUTH EVERY MORNING Lucas Warren, Lucas Fortis, MD Taking Active   Lancets [facility] DELICA PLUS [place]) MISC [id number] Yes USE ONE TO CHECK GLUCOSE TWICE DAILY Lucas Warren, Lucas Fortis, MD Taking Active   levocetirizine (XYZAL) 5 MG tablet [id number] Yes Take 1 tablet (5 mg total) by mouth every evening. Lucas Luz, MD Taking  Active   lisinopril (ZESTRIL) 10 MG tablet [id number] Yes TAKE ONE TABLET BY MOUTH EVERY EVENING Lucas Warren, Lucas Fortis, MD Taking  Active   metFORMIN (GLUCOPHAGE) 1000 MG tablet [id number] Yes TAKE ONE TABLET BY MOUTH TWICE DAILY WITH A MEAL Lucas Warren, Lucas Fortis, MD Taking Active   nitroGLYCERIN (NITROSTAT) 0.4 MG SL tablet [id number] Yes Take one tablet sublingual as needed for chest pain up to three doses as needed. Lucas Covey, MD Taking Active            Med Note Farrel Demark Oct 13, 2022 11:22 AM) Has never taken  pioglitazone (ACTOS) 45 MG tablet [id number] Yes TAKE ONE TABLET BY MOUTH EVERY MORNING Lucas Warren, Lucas Fortis, MD Taking Active   rosuvastatin (CRESTOR) 20 MG tablet [id number] Yes TAKE ONE TABLET BY MOUTH EVERYDAY AT BEDTIME Corky Crafts, MD Taking Active   Semaglutide, 2 MG/DOSE, 8 MG/3ML SOPN [id number] Yes Inject 2 mg as directed once a week. Lucas Covey, MD Taking Active   tadalafil (CIALIS) 20 MG tablet [id number] Yes TAKE ONE TABLET BY MOUTH every other DAY AS NEEDED Lucas Warren, Lucas Fortis, MD Taking Active   vitamin B-12 (CYANOCOBALAMIN) 1000 MCG tablet [id number] Yes Take 1,000 mcg by mouth at bedtime.  [provider] Taking Active Self            SDOH:  (Social Determinants of Health) assessments and interventions performed: Yes SDOH Interventions    Flowsheet Row Chronic Care Management from 05/26/2022 in [facility] [place] Clinical Support from 04/30/2022 in [facility] [place] Clinical Support from 04/29/2021 in [facility] [place] Chronic Care Management from 05/07/2020 in [facility] [place] Clinical Support from 04/26/2020 in [facility] [facility] [place] Chronic Care Management from 03/18/2020 in [facility] [place] [facility] [place]  SDOH Interventions        Food Insecurity Interventions -- Intervention Not Indicated  Intervention Not Indicated -- Intervention Not Indicated --  Housing Interventions -- Intervention Not Indicated Intervention Not Indicated -- Intervention Not Indicated --  Transportation Interventions -- Intervention Not Indicated Intervention Not Indicated Intervention Not Indicated Intervention Not Indicated --  Financial Strain Interventions Intervention Not Indicated Intervention Not Indicated Intervention Not Indicated Intervention Not Indicated Intervention Not Indicated Other (Comment)  [Patient assistance forms mailed and faxed for Januvia and Jardiance]  Physical Activity Interventions -- Intervention Not Indicated Intervention Not Indicated -- Intervention Not Indicated --  Stress Interventions -- Intervention Not Indicated Intervention Not Indicated -- Intervention Not Indicated --  Social Connections Interventions -- Intervention Not Indicated Intervention Not Indicated -- Intervention Not Indicated --       Medication Assistance: Ozempic obtained through PAP.  Medication Access: Name and location of current pharmacy:  [facility] - [place], [place] - [address] [place] [address] Phone: [phone] Fax: [phone]  [facility] - [place], [place] - [address] [place] [address] Phone: [phone] Fax: [phone]  Within the past 30 days, how often has patient missed a dose of medication? *** Is a pillbox or other method used to improve adherence? {YES/NO:21197} Factors that may affect medication adherence? {CHL DESC; BARRIERS:21522} Are meds synced by current pharmacy? Yes  Are meds delivered by current pharmacy? Yes  Does patient experience delays in picking up medications due to transportation concerns? No   Compliance/Adherence/Medication fill history: Care Gaps: Zoster Vaccine - Overdue COVID Booster - Overdue Eye Exam - Overdue Foot Exam -  Overdue PNA Vaccine - Postponed  AWV - 05/01/23  Star-Rating Drugs: Jardiance 10 mg - Last filled 01/19/23 30 DS at [facility] Glipizide 5 mg - Last filled 01/19/23 30 DS at [facility] Rosuvastatin 20 mg - Last filled 01/19/23 30 DS at [facility] Metformin 1000 mg - Last filled 01/19/23 30 DS at [facility] Pioglitazone 45 mg - Last filled 01/19/23 30 DS at [facility] Lisinopril 10 mg -  01/19/23 30 DS at [facility] - Patient assistance   Assessment/Plan Hypertension (BP goal <130/80) -Controlled -Current treatment: Lisinopril 10mg  1 qd -Medications previously tried: None  -Current home readings: *** -Current dietary habits: *** -Current exercise habits: *** -{ACTIONS;DENIES/REPORTS:21021675::"Denies"} hypotensive/hypertensive symptoms -Educated on {CCM BP Counseling:25124} -Counseled to monitor BP at home ***, document, and provide log at future appointments -{CCMPHARMDINTERVENTION:25122}  Diabetes (A1c goal <7%) -Controlled -Current medications: Glipizide 5mg  1 qd before breakfast Jardiance 10mg  1 qd Metformin 1000mg  1 BID Pioglitazone 45mg  1 qd Ozempic 2mg  once weekly -Medications previously tried: Trulicity, Glimepiride, Januvia, Onglyza -Current home glucose readings fasting glucose: *** post prandial glucose: *** -{ACTIONS;DENIES/REPORTS:21021675::"Denies"} hypoglycemic/hyperglycemic symptoms -Current meal patterns:  breakfast: ***  lunch: ***  dinner: *** snacks: *** drinks: *** -Current exercise: *** -Educated on {CCM DM COUNSELING:25123} -Counseled to check feet daily and get yearly eye exams -{CCMPHARMDINTERVENTION:25122}  Sherrill Raring Clinical Pharmacist [phone]

## 2023-02-22 ENCOUNTER — Encounter: Payer: Self-pay | Admitting: Pharmacist

## 2023-02-22 NOTE — Progress Notes (Signed)
Patient previously followed by [facility] pharmacist. Per clinical review, no pharmacist appointment needed at this time. Care guide directed to contact patient and cancel appointment and notify pharmacy team of any patient concerns.  

## 2023-03-29 ENCOUNTER — Ambulatory Visit: Payer: Medicare Other | Admitting: Family Medicine

## 2023-03-29 ENCOUNTER — Encounter: Payer: Self-pay | Admitting: Family Medicine

## 2023-03-29 VITALS — BP 100/60 | HR 73 | Temp 97.6°F | Ht 71.0 in | Wt 189.1 lb

## 2023-03-29 DIAGNOSIS — N183 Chronic kidney disease, stage 3 unspecified: Secondary | ICD-10-CM | POA: Diagnosis not present

## 2023-03-29 DIAGNOSIS — E1165 Type 2 diabetes mellitus with hyperglycemia: Secondary | ICD-10-CM

## 2023-03-29 DIAGNOSIS — Z7984 Long term (current) use of oral hypoglycemic drugs: Secondary | ICD-10-CM

## 2023-03-29 DIAGNOSIS — E7800 Pure hypercholesterolemia, unspecified: Secondary | ICD-10-CM

## 2023-03-29 DIAGNOSIS — I10 Essential (primary) hypertension: Secondary | ICD-10-CM

## 2023-03-29 LAB — POCT GLYCOSYLATED HEMOGLOBIN (HGB A1C): Hemoglobin A1C: 7 % — AB (ref 4.0–5.6)

## 2023-03-29 NOTE — Progress Notes (Signed)
Established Patient Office Visit  Subjective   Patient ID: Lucas Warren, male    DOB: Oct 15, 1945  Age: 77 y.o. MRN: [id number]  Chief Complaint  Patient presents with   Medical Management of Chronic Issues    HPI   Mr. Mandel is seen for medical follow-up.  His current problems include hypertension, type 2 diabetes, hyperlipidemia, chronic kidney disease.  He is doing reasonably well.  He had A1c of 7.0% in my absence back in April.  Medications reviewed include glipizide 5 mg daily, Jardiance 10 mg daily, lisinopril 10 mg daily, metformin 1000 mg twice daily, Actos 45 mg daily, rosuvastatin 20 mg daily, Ozempic 2 mg subcutaneous weekly.  No recent hypoglycemic symptoms.  Compliant with all medications.  Denies any side effects.  His weight went down significantly from 235 pounds to current weight of 189 pounds but seems to have plateaued and he is happy with his current weight.  No recent headaches.  Rare episodes of dizziness with rapid standing.  No chest pains.  He hopes to resume some golf this fall when weather more conducive.  Past Medical History:  Diagnosis Date   Acute URI 10/10/2012   Allergy    seasonal   Anxiety    Cataract    Chest pain, atypical    Colon polyps    hyperplastic 2003   DJD (degenerative joint disease)    DM (diabetes mellitus) (HCC)    GERD (gastroesophageal reflux disease)    History of headache    Hypercholesteremia    Psoriasis    Past Surgical History:  Procedure Laterality Date   CATARACT EXTRACTION, BILATERAL Bilateral    Dr. Darel Hong;    COLONOSCOPY     INGUINAL HERNIA REPAIR  1989   LEFT HEART CATH AND CORONARY ANGIOGRAPHY N/A 05/20/2020   Procedure: LEFT HEART CATH AND CORONARY ANGIOGRAPHY;  Surgeon: Corky Crafts, MD;  Location: [facility];  Service: Cardiovascular;  Laterality: N/A;   NASAL SEPTUM SURGERY  1989   SINOSCOPY     TONSILLECTOMY      reports that he quit smoking about 41 years ago. His smoking use  included cigarettes. He started smoking about 61 years ago. He has a 40 pack-year smoking history. He has never used smokeless tobacco. He reports current alcohol use. He reports that he does not use drugs. family history includes Alzheimer's disease in his father; Heart disease in his father; Leukemia in his sister; Lung cancer in his mother. Allergies  Allergen Reactions   Lisinopril Cough   Erythromycin     REACTION: abd pain   Trulicity [Dulaglutide] Nausea Only    Review of Systems  Constitutional:  Negative for malaise/fatigue.  Eyes:  Negative for blurred vision.  Respiratory:  Negative for shortness of breath.   Cardiovascular:  Negative for chest pain.  Neurological:  Negative for dizziness, weakness and headaches.      Objective:     BP 100/60 (BP Location: Left Arm, Patient Position: Sitting, Cuff Size: Normal)   Pulse 73   Temp 97.6 F (36.4 C) (Oral)   Ht 5\' 11"  (1.803 m)   Wt 189 lb 1.6 oz (85.8 kg)   SpO2 99%   BMI 26.37 kg/m    Physical Exam Vitals reviewed.  Constitutional:      Appearance: He is well-developed.  Eyes:     Pupils: Pupils are equal, round, and reactive to light.  Neck:     Thyroid: No thyromegaly.  Cardiovascular:  Rate and Rhythm: Normal rate and regular rhythm.  Pulmonary:     Effort: Pulmonary effort is normal. No respiratory distress.     Breath sounds: Normal breath sounds. No wheezing or rales.  Musculoskeletal:     Cervical back: Neck supple.     Right lower leg: No edema.     Left lower leg: No edema.  Neurological:     Mental Status: He is alert and oriented to person, place, and time.      Results for orders placed or performed in visit on 03/29/23  POC HgB A1c  Result Value Ref Range   Hemoglobin A1C 7.0 (A) 4.0 - 5.6 %   HbA1c POC (<> result, manual entry)     HbA1c, POC (prediabetic range)     HbA1c, POC (controlled diabetic range)        The ASCVD Risk score (Arnett DK, et al., 2019) failed to  calculate for the following reasons:   The valid total cholesterol range is 130 to 320 mg/dL    Assessment & Plan:   #1 type 2 diabetes controlled with A1c 7.0%.  Continue current regimen as above.  He is due for follow-up eye exam and is encouraged to schedule.  Foot exam today reveals no neuropathy no concerning lesions. -Set up 72-month follow-up  #2 hypertension stable and controlled with lisinopril 10 mg daily.  Continue current dosage.  Continue low-sodium diet.  #3 hyperlipidemia treated with Crestor 20 mg daily.  Recheck fasting lipids and hepatic panel at 64-month follow-up.  Continue low saturated fat diet     Evelena Peat, MD

## 2023-03-29 NOTE — Patient Instructions (Signed)
Set up diabetic eye exam  A1C today stable at 7.0.  Consider Shingrix vaccine at some point this year.

## 2023-04-07 ENCOUNTER — Other Ambulatory Visit: Payer: Self-pay | Admitting: Interventional Cardiology

## 2023-04-15 ENCOUNTER — Encounter (INDEPENDENT_AMBULATORY_CARE_PROVIDER_SITE_OTHER): Payer: Self-pay

## 2023-04-26 ENCOUNTER — Telehealth: Payer: Self-pay

## 2023-04-26 NOTE — Progress Notes (Signed)
   04/26/2023  Patient ID: Lucas Warren, male   DOB: 1946/06/17, 77 y.o.   MRN: [id number]  Returning patient's call to PCP office requesting to speak with clinical pharmacist.  -Patient receives Ozempic 2mg  through [employer] [facility], and he started on last pen yesterday- will run out in 3 more weeks -Contact [employer] [facility], and Ozempic 2mg  is in the process of shipping as of 8/19; so medication should be received by Dr. Lucie Leather office no later than the middle of next week.  Patient is also enrolled in automatic refill -Contacted patient to inform him -Coordinating with medication assistance team to add to PAP spreadsheet, so they can assist with program re-enrollment for 2025  Lenna Gilford, PharmD, DPLA

## 2023-04-29 DIAGNOSIS — H52223 Regular astigmatism, bilateral: Secondary | ICD-10-CM | POA: Diagnosis not present

## 2023-04-29 DIAGNOSIS — H5203 Hypermetropia, bilateral: Secondary | ICD-10-CM | POA: Diagnosis not present

## 2023-04-29 DIAGNOSIS — H524 Presbyopia: Secondary | ICD-10-CM | POA: Diagnosis not present

## 2023-04-29 DIAGNOSIS — E119 Type 2 diabetes mellitus without complications: Secondary | ICD-10-CM | POA: Diagnosis not present

## 2023-04-29 DIAGNOSIS — Z961 Presence of intraocular lens: Secondary | ICD-10-CM | POA: Diagnosis not present

## 2023-05-05 ENCOUNTER — Ambulatory Visit (INDEPENDENT_AMBULATORY_CARE_PROVIDER_SITE_OTHER): Payer: Medicare Other

## 2023-05-05 VITALS — Ht 71.0 in | Wt 185.0 lb

## 2023-05-05 DIAGNOSIS — Z0000 Encounter for general adult medical examination without abnormal findings: Secondary | ICD-10-CM | POA: Diagnosis not present

## 2023-05-05 NOTE — Progress Notes (Signed)
Subjective:   Lucas Warren is a 77 y.o. male who presents for Medicare Annual/Subsequent preventive examination.  Visit Complete: Virtual  I connected with  Lucas Warren on 05/05/23 by a audio enabled telemedicine application and verified that I am speaking with the correct person using two identifiers.  Patient Location: Home  Provider Location: Home Office  I discussed the limitations of evaluation and management by telemedicine. The patient expressed understanding and agreed to proceed.   Review of Systems    Vital Signs: Unable to obtain new vitals due to this being a telehealth visit.  Cardiac Risk Factors include: advanced age (>47men, >38 women);male gender;diabetes mellitus;hypertension     Objective:    Today's Vitals   05/05/23 1239  Weight: 185 lb (83.9 kg)  Height: 5\' 11"  (1.803 m)   Body mass index is 25.8 kg/m.     05/05/2023   12:46 PM 04/30/2022    9:33 AM 04/29/2021    9:10 AM 05/20/2020    7:06 AM 04/26/2020    9:08 AM 01/18/2018    9:31 AM 01/06/2017    8:35 AM  Advanced Directives  Does Patient Have a Medical Advance Directive? Yes Yes Yes Yes Yes Yes Yes  Type of Estate agent of [place];Living will Healthcare Power of [place];Living will Healthcare Power of [place];Living will Healthcare Power of [employer] of [place];Living will    Does patient want to make changes to medical advance directive?     No - Patient declined    Copy of Healthcare Power of Attorney in Chart? No - copy requested No - copy requested No - copy requested  No - copy requested      Current Medications (verified) Outpatient Encounter Medications as of 05/05/2023  Medication Sig   aspirin 81 MG tablet Take 1 tablet by mouth once a week.   glipiZIDE (GLUCOTROL) 5 MG tablet Take 1 tablet (5 mg total) by mouth daily before breakfast.   glucose blood (ONETOUCH ULTRA) test strip USE TO check blood glucose twice daily AS DIRECTED    ipratropium (ATROVENT) 0.06 % nasal spray Place 2 sprays each nostril up to 4 times a day for symptoms relief   JARDIANCE 10 MG TABS tablet TAKE ONE TABLET BY MOUTH EVERY MORNING   Lancets (ONETOUCH DELICA PLUS LANCET33G) MISC USE ONE TO CHECK GLUCOSE TWICE DAILY   levocetirizine (XYZAL) 5 MG tablet Take 1 tablet (5 mg total) by mouth every evening.   lisinopril (ZESTRIL) 10 MG tablet TAKE ONE TABLET BY MOUTH EVERY EVENING   metFORMIN (GLUCOPHAGE) 1000 MG tablet TAKE ONE TABLET BY MOUTH TWICE DAILY WITH A MEAL   nitroGLYCERIN (NITROSTAT) 0.4 MG SL tablet Take one tablet sublingual as needed for chest pain up to three doses as needed.   pioglitazone (ACTOS) 45 MG tablet TAKE ONE TABLET BY MOUTH EVERY MORNING   rosuvastatin (CRESTOR) 20 MG tablet TAKE ONE TABLET BY MOUTH EVERYDAY AT BEDTIME   Semaglutide, 2 MG/DOSE, 8 MG/3ML SOPN Inject 2 mg as directed once a week.   tadalafil (CIALIS) 20 MG tablet TAKE ONE TABLET BY MOUTH every other DAY AS NEEDED   vitamin B-12 (CYANOCOBALAMIN) 1000 MCG tablet Take 1,000 mcg by mouth at bedtime.    No facility-administered encounter medications on file as of 05/05/2023.    Allergies (verified) Lisinopril, Erythromycin, and Trulicity [dulaglutide]   History: Past Medical History:  Diagnosis Date   Acute URI 10/10/2012   Allergy    seasonal  Anxiety    Cataract    Chest pain, atypical    Colon polyps    hyperplastic 2003   DJD (degenerative joint disease)    DM (diabetes mellitus) (HCC)    GERD (gastroesophageal reflux disease)    History of headache    Hypercholesteremia    Psoriasis    Past Surgical History:  Procedure Laterality Date   CATARACT EXTRACTION, BILATERAL Bilateral    Dr. Darel Hong;    COLONOSCOPY     INGUINAL HERNIA REPAIR  1989   LEFT HEART CATH AND CORONARY ANGIOGRAPHY N/A 05/20/2020   Procedure: LEFT HEART CATH AND CORONARY ANGIOGRAPHY;  Surgeon: Corky Crafts, MD;  Location: [facility];  Service:  Cardiovascular;  Laterality: N/A;   NASAL SEPTUM SURGERY  1989   SINOSCOPY     TONSILLECTOMY     Family History  Problem Relation Age of Onset   Lung cancer Mother    Alzheimer's disease Father    Heart disease Father    Leukemia Sister    Allergic rhinitis Neg Hx    Asthma Neg Hx    Eczema Neg Hx    Urticaria Neg Hx    Colon cancer Neg Hx    Colon polyps Neg Hx    Esophageal cancer Neg Hx    Rectal cancer Neg Hx    Stomach cancer Neg Hx    Social History   Socioeconomic History   Marital status: Divorced    Spouse name: carolyn   Number of children: Not on file   Years of education: Not on file   Highest education level: GED or equivalent  Occupational History    Comment: Curator  Tobacco Use   Smoking status: Former    Current packs/day: 0.00    Average packs/day: 2.0 packs/day for 20.0 years (40.0 ttl pk-yrs)    Types: Cigarettes    Start date: 08/31/1961    Quit date: 08/31/1981    Years since quitting: 41.7   Smokeless tobacco: Never  Vaping Use   Vaping status: Never Used  Substance and Sexual Activity   Alcohol use: Yes    Comment: rarely a beer   Drug use: Never   Sexual activity: Not Currently  Other Topics Concern   Not on file  Social History Narrative   Not on file   Social Determinants of Health   Financial Resource Strain: Low Risk  (05/05/2023)   Overall Financial Resource Strain (CARDIA)    Difficulty of Paying Living Expenses: Not hard at all  Food Insecurity: No Food Insecurity (05/05/2023)   Hunger Vital Sign    Worried About Running Out of Food in the Last Year: Never true    Ran Out of Food in the Last Year: Never true  Transportation Needs: No Transportation Needs (05/05/2023)   PRAPARE - Administrator, Civil Service (Medical): No    Lack of Transportation (Non-Medical): No  Physical Activity: Sufficiently Active (05/05/2023)   Exercise Vital Sign    Days of Exercise per Week: 5 days    Minutes of Exercise per  Session: 30 min  Stress: No Stress Concern Present (05/05/2023)   [employer] of Occupational Health - Occupational Stress Questionnaire    Feeling of Stress : Not at all  Social Connections: Moderately Integrated (05/05/2023)   Social Connection and Isolation Panel [NHANES]    Frequency of Communication with Friends and Family: More than three times a week    Frequency of Social  Gatherings with Friends and Family: More than three times a week    Attends Religious Services: More than 4 times per year    Active Member of Clubs or Organizations: Yes    Attends Engineer, structural: More than 4 times per year    Marital Status: Divorced    Tobacco Counseling Counseling given: Not Answered   Clinical Intake:  Pre-visit preparation completed: Yes  Pain : No/denies pain     BMI - recorded: 25.8 Nutritional Status: BMI 25 -29 Overweight Nutritional Risks: None Diabetes: Yes CBG done?: Yes (CBG 110 Per patient) CBG resulted in Enter/ Edit results?: Yes Did pt. bring in CBG monitor from home?: No  How often do you need to have someone help you when you read instructions, pamphlets, or other written materials from your doctor or pharmacy?: 1 - Never  Interpreter Needed?: No  Information entered by :: Theresa Mulligan LPN   Activities of Daily Living    05/05/2023   12:45 PM  In your present state of health, do you have any difficulty performing the following activities:  Hearing? 0  Vision? 0  Difficulty concentrating or making decisions? 0  Walking or climbing stairs? 0  Dressing or bathing? 0  Doing errands, shopping? 0  Preparing Food and eating ? N  Using the Toilet? N  In the past six months, have you accidently leaked urine? N  Do you have problems with loss of bowel control? N  Managing your Medications? N  Managing your Finances? N  Housekeeping or managing your Housekeeping? N    Patient Care Team: Kristian Covey, MD as PCP - General (Family  Medicine) Corky Crafts, MD as PCP - Cardiology (Cardiology) Verner Chol, [facility] (Inactive) as Pharmacist (Pharmacist)  Indicate any recent Medical Services you may have received from other than [facility] providers in the past year (date may be approximate).     Assessment:   This is a routine wellness examination for [place].  Hearing/Vision screen Hearing Screening - Comments:: Denies hearing difficulties   Vision Screening - Comments:: Wears rx glasses - up to date with routine eye exams with  [facility]  Dietary issues and exercise activities discussed:     Goals Addressed               This Visit's Progress     Stay Healthy (pt-stated)        Stay active!       Depression Screen    05/05/2023   12:45 PM 12/29/2022   10:59 AM 09/02/2022    3:34 PM 04/30/2022    9:29 AM 11/26/2021   10:39 AM 06/13/2021    8:37 AM 04/29/2021    9:13 AM  PHQ 2/9 Scores  PHQ - 2 Score 0 0 2 0 2 0 0  PHQ- 9 Score  0 9  4      Fall Risk    05/05/2023   12:46 PM 12/29/2022   10:59 AM 12/28/2022   10:44 AM 09/02/2022    3:34 PM 04/30/2022    9:32 AM  Fall Risk   Falls in the past year? 0 0 0 0 0  Number falls in past yr: 0 0  0 0  Injury with Fall? 0 0  0 0  Risk for fall due to : No Fall Risks No Fall Risks  No Fall Risks No Fall Risks  Follow up Falls prevention discussed Falls evaluation completed  Falls evaluation completed Falls  prevention discussed    MEDICARE RISK AT HOME: Medicare Risk at Home Any stairs in or around the home?: No If so, are there any without handrails?: No Home free of loose throw rugs in walkways, pet beds, electrical cords, etc?: Yes Adequate lighting in your home to reduce risk of falls?: Yes Life alert?: No Use of a cane, walker or w/c?: No Grab bars in the bathroom?: Yes Shower chair or bench in shower?: No Elevated toilet seat or a handicapped toilet?: Yes  TIMED UP AND GO:  Was the test performed?  No    Cognitive Function:     01/18/2018    9:42 AM 01/06/2017    8:44 AM  MMSE - Mini Mental State Exam  Not completed: -- --        05/05/2023   12:47 PM 04/30/2022    9:33 AM  6CIT Screen  What Year? 0 points 0 points  What month? 0 points 0 points  What time? 0 points 0 points  Count back from 20 0 points 0 points  Months in reverse 0 points 0 points  Repeat phrase 0 points 0 points  Total Score 0 points 0 points    Immunizations Immunization History  Administered Date(s) Administered   Fluad Quad(high Dose 65+) 05/09/2019, 06/11/2020, 06/30/2022   Influenza Split 10/05/2011   Influenza Whole 07/16/2009   Influenza, High Dose Seasonal PF 05/22/2015, 06/29/2016, 06/15/2017, 05/20/2018   Influenza,inj,Quad PF,6+ Mos 07/07/2013, 05/04/2014   PFIZER(Purple Top)SARS-COV-2 Vaccination 10/22/2019, 11/15/2019, 07/09/2020   Pneumococcal Conjugate-13 08/21/2014   Pneumococcal Polysaccharide-23 12/05/2012   Pneumococcal-Unspecified 10/11/2007   Td 03/09/2006   Tdap 03/25/2016   Zoster, Live 03/31/2016    TDAP status: Up to date  Flu Vaccine status: Due, Education has been provided regarding the importance of this vaccine. Advised may receive this vaccine at local pharmacy or Health Dept. Aware to provide a copy of the vaccination record if obtained from local pharmacy or Health Dept. Verbalized acceptance and understanding.  Pneumococcal vaccine status: Due, Education has been provided regarding the importance of this vaccine. Advised may receive this vaccine at local pharmacy or Health Dept. Aware to provide a copy of the vaccination record if obtained from local pharmacy or Health Dept. Verbalized acceptance and understanding.  Covid-19 vaccine status: Declined, Education has been provided regarding the importance of this vaccine but patient still declined. Advised may receive this vaccine at local pharmacy or Health Dept.or vaccine clinic. Aware to provide a copy of the vaccination record if obtained from local  pharmacy or Health Dept. Verbalized acceptance and understanding.  Qualifies for Shingles Vaccine? Yes   Zostavax completed No   Shingrix Completed?: No.    Education has been provided regarding the importance of this vaccine. Patient has been advised to call insurance company to determine out of pocket expense if they have not yet received this vaccine. Advised may also receive vaccine at local pharmacy or Health Dept. Verbalized acceptance and understanding.  Screening Tests Health Maintenance  Topic Date Due   Zoster Vaccines- Shingrix (1 of 2) 01/11/1998   Pneumonia Vaccine 34+ Years old (3 of 3 - PPSV23 or PCV20) 08/22/2019   OPHTHALMOLOGY EXAM  05/14/2022   INFLUENZA VACCINE  04/01/2023   COVID-19 Vaccine (4 - 2023-24 season) 05/02/2023   Diabetic kidney evaluation - Urine ACR  07/01/2023   Diabetic kidney evaluation - eGFR measurement  08/29/2023   HEMOGLOBIN A1C  09/29/2023   FOOT EXAM  03/28/2024   Medicare Annual Wellness (AWV)  05/04/2024   DTaP/Tdap/Td (3 - Td or Tdap) 03/25/2026   Hepatitis C Screening  Completed   HPV VACCINES  Aged Out   Colonoscopy  Discontinued    Health Maintenance  Health Maintenance Due  Topic Date Due   Zoster Vaccines- Shingrix (1 of 2) 01/11/1998   Pneumonia Vaccine 80+ Years old (3 of 3 - PPSV23 or PCV20) 08/22/2019   OPHTHALMOLOGY EXAM  05/14/2022   INFLUENZA VACCINE  04/01/2023   COVID-19 Vaccine (4 - 2023-24 season) 05/02/2023      Lung Cancer Screening: (Low Dose CT Chest recommended if Age 71-80 years, 20 pack-year currently smoking OR have quit w/in 15years.) does not qualify.     Additional Screening:  Hepatitis C Screening: does qualify; Completed 03/25/16  Vision Screening: Recommended annual ophthalmology exams for early detection of glaucoma and other disorders of the eye. Is the patient up to date with their annual eye exam?  Yes  Who is the provider or what is the name of the office in which the patient attends  annual eye exams? [facility] If pt is not established with a provider, would they like to be referred to a provider to establish care? No .   Dental Screening: Recommended annual dental exams for proper oral hygiene  Diabetic Foot Exam: Diabetic Foot Exam: Completed 03/29/23  Community Resource Referral / Chronic Care Management:  CRR required this visit?  No   CCM required this visit?  No     Plan:     I have personally reviewed and noted the following in the patient's chart:   Medical and social history Use of alcohol, tobacco or illicit drugs  Current medications and supplements including opioid prescriptions. Patient is not currently taking opioid prescriptions. Functional ability and status Nutritional status Physical activity Advanced directives List of other physicians Hospitalizations, surgeries, and ER visits in previous 12 months Vitals Screenings to include cognitive, depression, and falls Referrals and appointments  In addition, I have reviewed and discussed with patient certain preventive protocols, quality metrics, and best practice recommendations. A written personalized care plan for preventive services as well as general preventive health recommendations were provided to patient.     Tillie Rung, LPN   01/31/1307   After Visit Summary: (MyChart) Due to this being a telephonic visit, the after visit summary with patients personalized plan was offered to patient via MyChart   Nurse Notes: None

## 2023-05-05 NOTE — Patient Instructions (Addendum)
Lucas Warren , Thank you for taking time to come for your Medicare Wellness Visit. I appreciate your ongoing commitment to your health goals. Please review the following plan we discussed and let me know if I can assist you in the future.   Referrals/Orders/Follow-Ups/Clinician Recommendations:   This is a list of the screening recommended for you and due dates:  Health Maintenance  Topic Date Due   Zoster (Shingles) Vaccine (1 of 2) 01/11/1998   Pneumonia Vaccine (3 of 3 - PPSV23 or PCV20) 08/22/2019   Eye exam for diabetics  05/14/2022   Flu Shot  04/01/2023   COVID-19 Vaccine (4 - 2023-24 season) 05/02/2023   Yearly kidney health urinalysis for diabetes  07/01/2023   Yearly kidney function blood test for diabetes  08/29/2023   Hemoglobin A1C  09/29/2023   Complete foot exam   03/28/2024   Medicare Annual Wellness Visit  05/04/2024   DTaP/Tdap/Td vaccine (3 - Td or Tdap) 03/25/2026   Hepatitis C Screening  Completed   HPV Vaccine  Aged Out   Colon Cancer Screening  Discontinued    Advanced directives: (Copy Requested) Please bring a copy of your health care power of attorney and living will to the office to be added to your chart at your convenience.  Next Medicare Annual Wellness Visit scheduled for next year: Yes

## 2023-05-10 ENCOUNTER — Other Ambulatory Visit: Payer: Self-pay | Admitting: Allergy

## 2023-05-12 ENCOUNTER — Other Ambulatory Visit: Payer: Self-pay | Admitting: Allergy

## 2023-05-17 ENCOUNTER — Other Ambulatory Visit: Payer: Self-pay | Admitting: Internal Medicine

## 2023-05-19 ENCOUNTER — Telehealth: Payer: Self-pay | Admitting: Family Medicine

## 2023-05-19 NOTE — Telephone Encounter (Signed)
Pt called to confirm that he has been switched from [facility] to [facility].  Pt is asking if he will need to call them to request his refills when they are due, or will [facility] automatically send them?

## 2023-05-19 NOTE — Telephone Encounter (Signed)
I spoke with the patient and informed him to contact his pharmacy when refills are due and we will refill his medications once request is sent by Pharmacy.

## 2023-05-20 DIAGNOSIS — Z85828 Personal history of other malignant neoplasm of skin: Secondary | ICD-10-CM | POA: Diagnosis not present

## 2023-05-20 DIAGNOSIS — D225 Melanocytic nevi of trunk: Secondary | ICD-10-CM | POA: Diagnosis not present

## 2023-05-20 DIAGNOSIS — L570 Actinic keratosis: Secondary | ICD-10-CM | POA: Diagnosis not present

## 2023-05-20 DIAGNOSIS — L814 Other melanin hyperpigmentation: Secondary | ICD-10-CM | POA: Diagnosis not present

## 2023-05-20 DIAGNOSIS — D1801 Hemangioma of skin and subcutaneous tissue: Secondary | ICD-10-CM | POA: Diagnosis not present

## 2023-05-20 DIAGNOSIS — L821 Other seborrheic keratosis: Secondary | ICD-10-CM | POA: Diagnosis not present

## 2023-05-20 DIAGNOSIS — D692 Other nonthrombocytopenic purpura: Secondary | ICD-10-CM | POA: Diagnosis not present

## 2023-05-24 ENCOUNTER — Telehealth: Payer: Self-pay | Admitting: Family Medicine

## 2023-05-24 ENCOUNTER — Telehealth: Payer: Self-pay

## 2023-05-24 MED ORDER — PIOGLITAZONE HCL 45 MG PO TABS: 45.0000 mg | ORAL_TABLET | Freq: Every morning | ORAL | 1 refills | Status: DC

## 2023-05-24 MED ORDER — EMPAGLIFLOZIN 10 MG PO TABS: 10.0000 mg | ORAL_TABLET | Freq: Every morning | ORAL | 1 refills | Status: DC

## 2023-05-24 MED ORDER — LISINOPRIL 10 MG PO TABS: 10.0000 mg | ORAL_TABLET | Freq: Every evening | ORAL | 1 refills | Status: DC

## 2023-05-24 MED ORDER — GLIPIZIDE 5 MG PO TABS: 5.0000 mg | ORAL_TABLET | Freq: Every day | ORAL | 1 refills | Status: DC

## 2023-05-24 MED ORDER — METFORMIN HCL 1000 MG PO TABS: 1000.0000 mg | ORAL_TABLET | Freq: Two times a day (BID) | ORAL | 1 refills | Status: DC

## 2023-05-24 NOTE — Telephone Encounter (Signed)
-----   Message from Sherrill Raring sent at 05/24/2023 12:49 PM EDT ----- Regarding: Med Refills- New Pharmacy Hello,  Patient requesting prescriptions for the following be sent to his new pharmacy:  Glipizide 5mg  1 tab daily before breakfast Jardiance 10mg  1 tab daily every morning Lisinopril 10mg  1 tab daily every evening Metformin 1000mg  1 tab twice daily with meals Pioglitazone 45mg  1 tab every morning Rosuvastatin 20mg  1 tab every evening at bedtime   Pharmacy Info: [facility] - [place], [place] - [address] P: [phone] F: [phone]  Patient was impacted by [facility] closing. [facility] did not receive the transfers. Is out of his medications.  Sherrill Raring, PharmD Clinical Pharmacist [phone]

## 2023-05-24 NOTE — Telephone Encounter (Signed)
Rx sent. Could not refill Crestor due to that being prescribed by Dr. Eldridge Dace

## 2023-05-24 NOTE — Progress Notes (Signed)
Patient called and states he does not want to use [facility] (the pharmacy [facility] has given their former prescriptions to for patients. Per patient, [facility] has not received the patient's prescriptions from [facility].  Patient requesting his prescriptions be sent to [facility] for packaging and delivery. Requesting from PCP.  Sherrill Raring, PharmD Clinical Pharmacist [phone]

## 2023-05-24 NOTE — Telephone Encounter (Signed)
Rx was sent per message from [facility] pharmacist

## 2023-05-24 NOTE — Telephone Encounter (Signed)
Prescription Request  05/24/2023  LOV: 03/29/2023  What is the name of the medication or equipment?  glipiZIDE (GLUCOTROL) 5 MG tablet  Have you contacted your pharmacy to request a refill? Yes   Which pharmacy would you like this sent to?   [facility] - [place], [place] - [address] [place] [address] Phone: [phone] Fax: [phone]      Patient notified that their request is being sent to the clinical staff for review and that they should receive a response within 2 business days.   Please advise at Mobile [phone] (mobile)

## 2023-06-08 NOTE — Progress Notes (Signed)
  Cardiology Office Note:   Date:  06/08/2023  ID:  Lucas Warren, DOB 12/25/1945, MRN [id number] PCP:  Kristian Covey, MD  [facility] Providers Cardiologist:  Alverda Skeans, MD Referring MD: Kristian Covey, MD  Chief Complaint/Reason for Referral: Cardiology follow-up ASSESSMENT:    PATIENT DID NOT APPEAR FOR APPOINTMENT   1. Coronary artery disease involving native coronary artery of native heart without angina pectoris   2. Type 2 diabetes mellitus with complication, without long-term current use of insulin (HCC)   3. Hypertension associated with diabetes (HCC)   4. Hyperlipidemia associated with type 2 diabetes mellitus (HCC)   5. Stage 3a chronic kidney disease (HCC)   6. BMI 27.0-27.9,adult     PLAN:   In order of problems listed above:  PATIENT DID NOT APPEAR FOR APPOINTMENT  Coronary artery disease: Continue aspirin, Crestor 20 mg, and as needed nitroglycerin. Type 2 diabetes: Continue aspirin, Jardiance, lisinopril, Crestor, and semaglutide. Hypertension: Hyperlipidemia: Check lipid panel, LFTs, LP(a) today. CKD stage III: Continue Jardiance and lisinopril for renal protection. Elevated BMI: Continue semaglutide.  PATIENT DID NOT APPEAR FOR APPOINTMENT         History of Present Illness:      FOCUSED PROBLEM LIST:   Coronary artery disease Mild on cardiac catheterization 2021 Type 2 diabetes not on insulin Hyperlipidemia Hypertension CKD stage III BMI 27 Left anterior fascicular block  October 2024: The patient returns for routine follow-up.  The patient was last seen about a year ago and was doing well without angina.  No changes were made to his medical regimen.          Current Medications: No outpatient medications have been marked as taking for the 06/09/23 encounter (Appointment) with Orbie Pyo, MD.     Review of Systems:   Please see the history of present illness.    All other systems reviewed and are negative.      EKGs/Labs/Other Test Reviewed:   EKG: EKG performed July 2023 that I reviewed demonstrates sinus rhythm with left anterior fascicular block  EKG Interpretation Date/Time:    Ventricular Rate:    PR Interval:    QRS Duration:    QT Interval:    QTC Calculation:   R Axis:      Text Interpretation:             Signed, Orbie Pyo, MD  06/08/2023 7:34 AM    [facility] [address] [place], [place], [place]  [address] Phone: [phone]; Fax: [phone]   Note:  This document was prepared using Dragon voice recognition software and may include unintentional dictation errors.This encounter was created in error - please disregard.

## 2023-06-09 ENCOUNTER — Ambulatory Visit: Payer: Medicare Other | Admitting: Internal Medicine

## 2023-06-11 ENCOUNTER — Encounter: Payer: Self-pay | Admitting: Family Medicine

## 2023-06-11 ENCOUNTER — Ambulatory Visit: Payer: Medicare Other | Admitting: Family Medicine

## 2023-06-11 VITALS — BP 116/62 | HR 65 | Ht 71.0 in | Wt 186.6 lb

## 2023-06-11 DIAGNOSIS — H8112 Benign paroxysmal vertigo, left ear: Secondary | ICD-10-CM

## 2023-06-11 DIAGNOSIS — Z23 Encounter for immunization: Secondary | ICD-10-CM | POA: Diagnosis not present

## 2023-06-11 DIAGNOSIS — E1165 Type 2 diabetes mellitus with hyperglycemia: Secondary | ICD-10-CM

## 2023-06-11 DIAGNOSIS — I10 Essential (primary) hypertension: Secondary | ICD-10-CM | POA: Diagnosis not present

## 2023-06-11 NOTE — Progress Notes (Signed)
Established Patient Office Visit  Subjective   Patient ID: Lucas Warren, male    DOB: 21-May-1946  Age: 77 y.o. MRN: [id number]  Chief Complaint  Patient presents with   Dizziness    Patient complains of dizziness, x4 days, Tried OTC motion sickness pill    HPI   Lucas Warren has history of hypertension, type 2 diabetes, GERD, chronic kidney disease stage III, hyperlipidemia.  Seen today with onset Sunday night or possibly Monday morning of vertigo symptoms.  No prior history of vertigo.  Woke up with spinning sensation.  He had some fairly severe nausea and 1 episode of vomiting but none since.  Symptoms are worse early in the morning.  Tend to get better some during the day.  Denies any headaches, hearing changes, sinus congestive symptoms, fever, or any focal weakness or speech changes.  No swallowing difficulties.  He took some over-the-counter meclizine which seemed to help slightly.  Type 2 diabetes with improving control.  Recent A1c 7.0%.  On multidrug regimen.  No recent hypoglycemic symptoms.  He has hypertension treated with lisinopril 10 mg daily and stable.  No recent orthostatic symptoms.  Past Medical History:  Diagnosis Date   Acute URI 10/10/2012   Allergy    seasonal   Anxiety    Cataract    Chest pain, atypical    Colon polyps    hyperplastic 2003   DJD (degenerative joint disease)    DM (diabetes mellitus) (HCC)    GERD (gastroesophageal reflux disease)    History of headache    Hypercholesteremia    Psoriasis    Past Surgical History:  Procedure Laterality Date   CATARACT EXTRACTION, BILATERAL Bilateral    Dr. Darel Hong;    COLONOSCOPY     INGUINAL HERNIA REPAIR  1989   LEFT HEART CATH AND CORONARY ANGIOGRAPHY N/A 05/20/2020   Procedure: LEFT HEART CATH AND CORONARY ANGIOGRAPHY;  Surgeon: Corky Crafts, MD;  Location: [facility];  Service: Cardiovascular;  Laterality: N/A;   NASAL SEPTUM SURGERY  1989   SINOSCOPY     TONSILLECTOMY       reports that he quit smoking about 41 years ago. His smoking use included cigarettes. He started smoking about 61 years ago. He has a 40 pack-year smoking history. He has never used smokeless tobacco. He reports current alcohol use. He reports that he does not use drugs. family history includes Alzheimer's disease in his father; Heart disease in his father; Leukemia in his sister; Lung cancer in his mother. Allergies  Allergen Reactions   Lisinopril Cough   Erythromycin     REACTION: abd pain   Trulicity [Dulaglutide] Nausea Only    Review of Systems  Constitutional:  Negative for chills, fever and malaise/fatigue.  HENT:  Negative for congestion.   Eyes:  Negative for blurred vision.  Respiratory:  Negative for shortness of breath.   Cardiovascular:  Negative for chest pain.  Neurological:  Positive for dizziness. Negative for speech change, focal weakness, seizures, loss of consciousness, weakness and headaches.      Objective:     BP 116/62 (BP Location: Left Arm, Patient Position: Sitting, Cuff Size: Normal)   Pulse 65   Ht 5\' 11"  (1.803 m)   Wt 186 lb 9.6 oz (84.6 kg)   SpO2 97%   BMI 26.03 kg/m  BP Readings from Last 3 Encounters:  06/11/23 116/62  03/29/23 100/60  12/29/22 118/70   Wt Readings from Last 3 Encounters:  06/11/23 186 lb 9.6 oz (84.6 kg)  05/05/23 185 lb (83.9 kg)  03/29/23 189 lb 1.6 oz (85.8 kg)      Physical Exam Vitals reviewed.  Constitutional:      Appearance: Normal appearance.  Cardiovascular:     Rate and Rhythm: Normal rate and regular rhythm.  Pulmonary:     Effort: Pulmonary effort is normal.     Breath sounds: Normal breath sounds. No wheezing or rales.  Neurological:     Mental Status: He is alert.     Cranial Nerves: No cranial nerve deficit.     Motor: No weakness.     Coordination: Coordination normal.     Comments: He has horizontal nystagmus when changing position from sitting to lying supine with head turned to the  left.  No vertical nystagmus.  Patient had fairly pronounced vertigo symptoms when going from supine to sitting with head turned 45 degrees to the left Normal finger-to-nose testing.  No focal weakness.      No results found for any visits on 06/11/23.  Last CBC Lab Results  Component Value Date   WBC 7.4 11/26/2021   HGB 11.8 (L) 11/26/2021   HCT 35.7 (L) 11/26/2021   MCV 86.4 11/26/2021   MCH 28.9 05/15/2020   RDW 15.1 11/26/2021   PLT 218.0 11/26/2021   Last metabolic panel Lab Results  Component Value Date   GLUCOSE 241 (H) 11/26/2021   NA 138 08/28/2022   K 3.7 08/28/2022   CL 103 08/28/2022   CO2 24 (A) 08/28/2022   BUN 22 11/26/2021   CREATININE 1.6 (A) 08/28/2022   EGFR 45 08/28/2022   CALCIUM 9.1 08/28/2022   PROT 7.2 11/26/2021   ALBUMIN 4.1 08/28/2022   BILITOT 0.4 11/26/2021   ALKPHOS 44 11/26/2021   AST 11 (A) 08/28/2022   ALT 7 (A) 08/28/2022   Last lipids Lab Results  Component Value Date   CHOL 126 09/29/2022   HDL 51.80 09/29/2022   LDLCALC 53 09/29/2022   TRIG 106.0 09/29/2022   CHOLHDL 2 09/29/2022   Last hemoglobin A1c Lab Results  Component Value Date   HGBA1C 7.0 (A) 03/29/2023      The ASCVD Risk score (Arnett DK, et al., 2019) failed to calculate for the following reasons:   The valid total cholesterol range is 130 to 320 mg/dL    Assessment & Plan:   #1 vertigo.  Onset earlier in the week.  Slowly improving.  Suspect benign peripheral positional vertigo to the left.  Symptoms reproducible on exam with visible horizontal nystagmus.  Symptoms resolved after a few minutes.  -Discussed Epley maneuvers.  He will try these at home and if no success over the next few days consider vestibular rehab referral -May continue over-the-counter meclizine as needed with caution for sedation -He knows not to drive or to do any climbing or other high risk activities until resolved  #2 type 2 diabetes fairly well-controlled with recent A1c 7.0%.   He has follow-up later this month and reassess at that time  #3 hypertension controlled on lisinopril 10 mg daily.  No recent orthostatic symptoms.  Continue current dosage.  Patient requesting flu vaccine this was given today   No follow-ups on file.    Evelena Peat, MD

## 2023-06-21 NOTE — Progress Notes (Deleted)
Cardiology Office Note:   Date:  06/21/2023  ID:  Lucas Warren, DOB Jul 20, 1946, MRN [id number] PCP:  Kristian Covey, MD  [facility] Providers Cardiologist:  Alverda Skeans, MD Referring MD: Kristian Covey, MD  Chief Complaint/Reason for Referral: Cardiology follow-up ASSESSMENT:   No diagnosis found.  PLAN:   In order of problems listed above: Coronary artery disease: Continue aspirin, Crestor 20 mg, and as needed nitroglycerin. Type 2 diabetes: Continue aspirin, Jardiance, lisinopril, Crestor, and semaglutide. Hypertension:*** Hyperlipidemia: Check lipid panel, LFTs, LP(a) today. CKD stage III: Continue Jardiance and lisinopril for renal protection. Elevated BMI: Continue semaglutide.        {Are you ordering a CV Procedure (e.g. stress test, cath, DCCV, TEE, etc)?   Press F2        :[id number]}   Dispo:  No follow-ups on file.      Medication Adjustments/Labs and Tests Ordered: Current medicines are reviewed at length with the patient today.  Concerns regarding medicines are outlined above.  The following changes have been made:  {PLAN; NO CHANGE:13088:s}   Labs/tests ordered: No orders of the defined types were placed in this encounter.   Medication Changes: No orders of the defined types were placed in this encounter.   Current medicines are reviewed at length with the patient today.  The patient {ACTIONS; HAS/DOES NOT HAVE:19233} concerns regarding medicines.  I spent *** minutes reviewing all clinical data during and prior to this visit including all relevant imaging studies, laboratories, clinical information from other health systems, and prior notes from both Cardiology and other specialties, interviewing the patient, and conducting a complete physical examination in order to formulate a comprehensive and personalized evaluation and treatment plan.  History of Present Illness:      FOCUSED PROBLEM LIST:   Coronary artery disease Mild on cardiac  catheterization 2021 Type 2 diabetes not on insulin Hyperlipidemia Hypertension CKD stage III BMI 27 Left anterior fascicular block  October 2024: The patient returns for routine follow-up.  The patient was last seen about a year ago and was doing well without angina.  No changes were made to his medical regimen.              Current Medications: No outpatient medications have been marked as taking for the 06/22/23 encounter (Appointment) with Orbie Pyo, MD.     Review of Systems:   Please see the history of present illness.    All other systems reviewed and are negative.     EKGs/Labs/Other Test Reviewed:   EKG:  EKG performed July 2023 that I reviewed demonstrates sinus rhythm with left anterior fascicular block   EKG Interpretation Date/Time:    Ventricular Rate:    PR Interval:    QRS Duration:    QT Interval:    QTC Calculation:   R Axis:      Text Interpretation:           Risk Assessment/Calculations:   {Does this patient have ATRIAL FIBRILLATION?:[phone]}      Physical Exam:   VS:  There were no vitals taken for this visit.   No BP recorded.  {Refresh Note OR Click here to enter BP  :1}***   Wt Readings from Last 3 Encounters:  06/11/23 186 lb 9.6 oz (84.6 kg)  05/05/23 185 lb (83.9 kg)  03/29/23 189 lb 1.6 oz (85.8 kg)      GENERAL:  No apparent distress, AOx3 HEENT:  No carotid bruits, +2 carotid impulses, no scleral  icterus CAR: RRR Irregular RR*** no murmurs***, gallops, rubs, or thrills RES:  Clear to auscultation bilaterally ABD:  Soft, nontender, nondistended, positive bowel sounds x 4 VASC:  +2 radial pulses, +2 carotid pulses NEURO:  CN 2-12 grossly intact; motor and sensory grossly intact PSYCH:  No active depression or anxiety EXT:  No edema, ecchymosis, or cyanosis  Signed, Orbie Pyo, MD  06/21/2023 2:11 PM    [facility] [address] [place], [place], [place]  [address] Phone: [phone];  Fax: [phone]   Note:  This document was prepared using Dragon voice recognition software and may include unintentional dictation errors.

## 2023-06-22 ENCOUNTER — Ambulatory Visit: Payer: Medicare Other | Admitting: Internal Medicine

## 2023-06-22 ENCOUNTER — Emergency Department (HOSPITAL_BASED_OUTPATIENT_CLINIC_OR_DEPARTMENT_OTHER): Payer: Medicare Other

## 2023-06-22 ENCOUNTER — Other Ambulatory Visit: Payer: Self-pay

## 2023-06-22 ENCOUNTER — Emergency Department (HOSPITAL_BASED_OUTPATIENT_CLINIC_OR_DEPARTMENT_OTHER)
Admission: EM | Admit: 2023-06-22 | Discharge: 2023-06-22 | Disposition: A | Discharge status: Home / Self Care | Payer: Medicare Other | Attending: Emergency Medicine | Admitting: Emergency Medicine

## 2023-06-22 ENCOUNTER — Encounter (HOSPITAL_BASED_OUTPATIENT_CLINIC_OR_DEPARTMENT_OTHER): Payer: Self-pay | Admitting: *Deleted

## 2023-06-22 DIAGNOSIS — R110 Nausea: Secondary | ICD-10-CM | POA: Diagnosis not present

## 2023-06-22 DIAGNOSIS — E119 Type 2 diabetes mellitus without complications: Secondary | ICD-10-CM | POA: Insufficient documentation

## 2023-06-22 DIAGNOSIS — Z7984 Long term (current) use of oral hypoglycemic drugs: Secondary | ICD-10-CM | POA: Diagnosis not present

## 2023-06-22 DIAGNOSIS — R42 Dizziness and giddiness: Secondary | ICD-10-CM

## 2023-06-22 HISTORY — DX: Dizziness and giddiness: R42

## 2023-06-22 LAB — BASIC METABOLIC PANEL
Anion gap: 14 (ref 5–15)
BUN: 17 mg/dL (ref 8–23)
CO2: 22 mmol/L (ref 22–32)
Calcium: 10.1 mg/dL (ref 8.9–10.3)
Chloride: 107 mmol/L (ref 98–111)
Creatinine, Ser: 1.61 mg/dL — ABNORMAL HIGH (ref 0.61–1.24)
GFR, Estimated: 44 mL/min — ABNORMAL LOW (ref >60)
Glucose, Bld: 220 mg/dL — ABNORMAL HIGH (ref 70–99)
Potassium: 3.8 mmol/L (ref 3.5–5.1)
Sodium: 143 mmol/L (ref 135–145)

## 2023-06-22 LAB — CBC WITH DIFFERENTIAL/PLATELET
Abs Immature Granulocytes: 0.07 10*3/uL (ref 0.00–0.07)
Basophils Absolute: 0.1 10*3/uL (ref 0.0–0.1)
Basophils Relative: 1 %
Eosinophils Absolute: 0.3 10*3/uL (ref 0.0–0.5)
Eosinophils Relative: 3 %
HCT: 38.5 % — ABNORMAL LOW (ref 39.0–52.0)
Hemoglobin: 12.7 g/dL — ABNORMAL LOW (ref 13.0–17.0)
Immature Granulocytes: 1 %
Lymphocytes Relative: 14 %
Lymphs Abs: 1.4 10*3/uL (ref 0.7–4.0)
MCH: 28.8 pg (ref 26.0–34.0)
MCHC: 33 g/dL (ref 30.0–36.0)
MCV: 87.3 fL (ref 80.0–100.0)
Monocytes Absolute: 0.7 10*3/uL (ref 0.1–1.0)
Monocytes Relative: 7 %
Neutro Abs: 7.7 10*3/uL (ref 1.7–7.7)
Neutrophils Relative %: 74 %
Platelets: 141 10*3/uL — ABNORMAL LOW (ref 150–400)
RBC: 4.41 MIL/uL (ref 4.22–5.81)
RDW: 14.4 % (ref 11.5–15.5)
WBC: 10.3 10*3/uL (ref 4.0–10.5)
nRBC: 0 % (ref 0.0–0.2)

## 2023-06-22 MED ORDER — ONDANSETRON 8 MG PO TBDP: ORAL_TABLET | ORAL | 0 refills | Status: DC

## 2023-06-22 MED ORDER — SODIUM CHLORIDE 0.9 % IV BOLUS
1000.0000 mL | Freq: Once | INTRAVENOUS | Status: AC
  Administered 2023-06-22: 1000 mL via INTRAVENOUS

## 2023-06-22 MED ORDER — MECLIZINE HCL 25 MG PO TABS: 25.0000 mg | ORAL_TABLET | Freq: Three times a day (TID) | ORAL | 0 refills | Status: DC | PRN

## 2023-06-22 MED ORDER — MECLIZINE HCL 25 MG PO TABS
25.0000 mg | ORAL_TABLET | Freq: Once | ORAL | Status: AC
  Administered 2023-06-22: 25 mg via ORAL
  Filled 2023-06-22: qty 1

## 2023-06-22 MED ORDER — ONDANSETRON HCL 4 MG/2ML IJ SOLN
4.0000 mg | Freq: Once | INTRAMUSCULAR | Status: AC
Start: 2023-06-22 — End: 2023-06-22
  Administered 2023-06-22: 4 mg via INTRAVENOUS
  Filled 2023-06-22: qty 2

## 2023-06-22 NOTE — ED Provider Notes (Signed)
[place] EMERGENCY DEPARTMENT AT [facility] Provider Note   CSN: [id number] Arrival date & time: 06/22/23  0049     History  Chief Complaint  Patient presents with   Dizziness    Lucas Warren is a 77 y.o. male.  Patient is a 77 year old male with past medical history of type 2 diabetes, hyperlipidemia.  Patient presenting today for evaluation of dizziness.  He reports a 3-week history of intermittent spinning sensation.  This seems to occur when he wakes up in the morning and when he changes position.  He describes a room spinning sensation that is associated with nausea.  He denies any injury or trauma.  He denies any visual disturbances, weakness, or numbness.  He was seen by his primary doctor who told him he probably had vertigo.  He has been taking Dramamine and performing the Epley maneuver.  Initially this seemed to help, but symptoms worsened this evening.  The history is provided by the patient.       Home Medications Prior to Admission medications   Medication Sig Start Date End Date Taking? Authorizing Provider  aspirin 81 MG tablet Take 1 tablet by mouth once a week.    [provider]  empagliflozin (JARDIANCE) 10 MG TABS tablet Take 1 tablet (10 mg total) by mouth every morning. 05/24/23   Burchette, Elberta Fortis, MD  glipiZIDE (GLUCOTROL) 5 MG tablet Take 1 tablet (5 mg total) by mouth daily before breakfast. 05/24/23   Burchette, Elberta Fortis, MD  glucose blood (ONETOUCH ULTRA) test strip USE TO check blood glucose twice daily AS DIRECTED 11/11/22   Burchette, Elberta Fortis, MD  ipratropium (ATROVENT) 0.06 % nasal spray Place 2 sprays each nostril up to 4 times a day for symptoms relief 05/06/22   Marcelyn Bruins, MD  Lancets [facility] DELICA PLUS [place]) MISC USE ONE TO CHECK GLUCOSE TWICE DAILY 09/14/22   Burchette, Elberta Fortis, MD  levocetirizine (XYZAL) 5 MG tablet TAKE 1 TABLET BY MOUTH EACH EVENING 05/17/23   Ferol Luz, MD  lisinopril  (ZESTRIL) 10 MG tablet Take 1 tablet (10 mg total) by mouth every evening. 05/24/23   Burchette, Elberta Fortis, MD  metFORMIN (GLUCOPHAGE) 1000 MG tablet Take 1 tablet (1,000 mg total) by mouth 2 (two) times daily with a meal. 05/24/23   Burchette, Elberta Fortis, MD  nitroGLYCERIN (NITROSTAT) 0.4 MG SL tablet Take one tablet sublingual as needed for chest pain up to three doses as needed. 05/03/20   Burchette, Elberta Fortis, MD  pioglitazone (ACTOS) 45 MG tablet Take 1 tablet (45 mg total) by mouth every morning. 05/24/23   Burchette, Elberta Fortis, MD  rosuvastatin (CRESTOR) 20 MG tablet TAKE ONE TABLET BY MOUTH EVERYDAY AT BEDTIME 04/07/23   Corky Crafts, MD  Semaglutide, 2 MG/DOSE, 8 MG/3ML SOPN Inject 2 mg as directed once a week. 12/29/22   [name], MD  tadalafil (CIALIS) 20 MG tablet TAKE ONE TABLET BY MOUTH every other DAY AS NEEDED 05/19/21   Burchette, Elberta Fortis, MD  vitamin B-12 (CYANOCOBALAMIN) 1000 MCG tablet Take 1,000 mcg by mouth at bedtime.     [provider]      Allergies    Lisinopril, Erythromycin, and Trulicity [dulaglutide]    Review of Systems   Review of Systems  All other systems reviewed and are negative.   Physical Exam Updated Vital Signs BP (!) 159/76 (BP Location: Right Arm)   Pulse 72   Temp 98.5 F (36.9 C) (Oral)  Resp 16   SpO2 100%  Physical Exam Vitals and nursing note reviewed.  Constitutional:      General: He is not in acute distress.    Appearance: He is well-developed. He is not diaphoretic.  HENT:     Head: Normocephalic and atraumatic.  Eyes:     Extraocular Movements: Extraocular movements intact.     Pupils: Pupils are equal, round, and reactive to light.  Cardiovascular:     Rate and Rhythm: Normal rate and regular rhythm.     Heart sounds: No murmur heard.    No friction rub.  Pulmonary:     Effort: Pulmonary effort is normal. No respiratory distress.     Breath sounds: Normal breath sounds. No wheezing or rales.  Abdominal:      General: Bowel sounds are normal. There is no distension.     Palpations: Abdomen is soft.     Tenderness: There is no abdominal tenderness.  Musculoskeletal:        General: Normal range of motion.     Cervical back: Normal range of motion and neck supple.  Skin:    General: Skin is warm and dry.  Neurological:     General: No focal deficit present.     Mental Status: He is alert and oriented to person, place, and time.     Cranial Nerves: No cranial nerve deficit.     Motor: No weakness.     Coordination: Coordination normal.     Gait: Gait normal.     ED Results / Procedures / Treatments   Labs (all labs ordered are listed, but only abnormal results are displayed) Labs Reviewed - No data to display  EKG None  Radiology No results found.  Procedures Procedures    Medications Ordered in ED Medications  sodium chloride 0.9 % bolus 1,000 mL (has no administration in time range)  meclizine (ANTIVERT) tablet 25 mg (has no administration in time range)    ED Course/ Medical Decision Making/ A&P  Patient is a 77 year old male presenting with dizziness as described in the HPI.  Patient arrives with stable vital signs and is afebrile.  Physical examination basically unremarkable.  Workup initiated including CBC and basic metabolic panel, both of which are unremarkable.  CT scan of the head shows no evidence for acute stroke or other abnormality.  Patient has received IV fluids along with meclizine and Zofran and symptoms seem to be improving.  I highly suspect a peripheral vertigo, not central.  His symptoms are worse when he moves around and changes position and relieved with rest.  I will discharge him with meclizine and ODT Zofran and have him follow-up with ENT if not improving in the next few days.  Final Clinical Impression(s) / ED Diagnoses Final diagnoses:  None    Rx / DC Orders ED Discharge Orders     None         Geoffery Lyons, MD 06/22/23 [phone]

## 2023-06-22 NOTE — ED Notes (Signed)
Patient transported to CT 

## 2023-06-22 NOTE — Discharge Instructions (Addendum)
Begin taking meclizine as needed for dizziness.  Begin taking Zofran as prescribed as needed for nausea.  Follow-up with ENT if not improving in the next few days.  The contact information for [facility] has been provided in this discharge summary for you to call and make these arrangements.

## 2023-06-22 NOTE — ED Triage Notes (Signed)
Pt is here for vertigo which he has had for 3 weeks.  He reports that he woke up 3 weeks ago and the room was spinning, he has been taking OTC motion sickness medication and then saw his PCP and he was evaluated and dx with vertigo.  No focal weakness.

## 2023-06-28 ENCOUNTER — Ambulatory Visit: Payer: Medicare Other | Admitting: Family Medicine

## 2023-07-01 ENCOUNTER — Ambulatory Visit: Payer: Medicare Other | Admitting: Interventional Cardiology

## 2023-07-01 ENCOUNTER — Telehealth: Payer: Self-pay | Admitting: Family Medicine

## 2023-07-01 DIAGNOSIS — R42 Dizziness and giddiness: Secondary | ICD-10-CM

## 2023-07-01 NOTE — Telephone Encounter (Signed)
Pt is calling and went to ER and was told to call [facility] ([place] [facility]) [phone] and they need a referral from PCP for vertigo

## 2023-07-02 NOTE — Telephone Encounter (Signed)
Patient aware and referral placed.

## 2023-07-05 ENCOUNTER — Telehealth: Payer: Self-pay | Admitting: Family Medicine

## 2023-07-05 ENCOUNTER — Encounter (INDEPENDENT_AMBULATORY_CARE_PROVIDER_SITE_OTHER): Payer: Self-pay | Admitting: Otolaryngology

## 2023-07-05 DIAGNOSIS — R42 Dizziness and giddiness: Secondary | ICD-10-CM

## 2023-07-05 NOTE — Telephone Encounter (Signed)
Pt did not take the appointment with dr patel ENT 08-03-2023 and would like to go to PT for vertigo

## 2023-07-06 NOTE — Telephone Encounter (Signed)
Need PT referral

## 2023-07-07 NOTE — Telephone Encounter (Signed)
Referral placed and patient aware 

## 2023-07-12 NOTE — Therapy (Signed)
OUTPATIENT PHYSICAL THERAPY VESTIBULAR EVALUATION     Patient Name: Lucas Warren MRN: [id number] DOB:1946/02/03, 77 y.o., male Today's Date: 07/13/2023  END OF SESSION:  PT End of Session - 07/13/23 0921     Visit Number 1    Number of Visits 1    Date for PT Re-Evaluation 07/13/23    Authorization Type [employer] Medicare    PT Start Time 0848    PT Stop Time 0918    PT Time Calculation (min) 30 min    Equipment Utilized During Treatment Gait belt    Activity Tolerance Patient tolerated treatment well    Behavior During Therapy WFL for tasks assessed/performed             Past Medical History:  Diagnosis Date   Acute URI 10/10/2012   Allergy    seasonal   Anxiety    Cataract    Chest pain, atypical    Colon polyps    hyperplastic 2003   DJD (degenerative joint disease)    DM (diabetes mellitus) (HCC)    GERD (gastroesophageal reflux disease)    History of headache    Hypercholesteremia    Psoriasis    Vertigo    Past Surgical History:  Procedure Laterality Date   CATARACT EXTRACTION, BILATERAL Bilateral    Dr. Darel Hong;    COLONOSCOPY     INGUINAL HERNIA REPAIR  1989   LEFT HEART CATH AND CORONARY ANGIOGRAPHY N/A 05/20/2020   Procedure: LEFT HEART CATH AND CORONARY ANGIOGRAPHY;  Surgeon: Corky Crafts, MD;  Location: [facility];  Service: Cardiovascular;  Laterality: N/A;   NASAL SEPTUM SURGERY  1989   SINOSCOPY     TONSILLECTOMY     Patient Active Problem List   Diagnosis Date Noted   Erectile dysfunction 03/11/2020   CKD (chronic kidney disease) stage 3, GFR 30-59 ml/min (HCC) 11/06/2019   Laryngopharyngeal reflux (LPR) 06/12/2019   Perennial allergic rhinitis 06/12/2019   Chronic right shoulder pain 09/24/2016   Cough 08/19/2016   Hemoptysis 08/19/2016   Normocytic anemia 06/29/2016   Hypertension 12/25/2015   Obesity (BMI 30-39.9) 02/02/2014   Acute URI 10/10/2012   Overweight 04/05/2012   Venous insufficiency 04/02/2011    HEADACHE 02/25/2008   CHEST PAIN, ATYPICAL 02/25/2008   HYPERCHOLESTEROLEMIA 02/24/2008   Poorly controlled type 2 diabetes mellitus (HCC) 02/23/2008   ANXIETY 02/23/2008   GERD 02/23/2008   PSORIASIS 02/23/2008   DEGENERATIVE JOINT DISEASE 02/23/2008   History of colonic polyps 02/23/2008    PCP: Kristian Covey, MD REFERRING PROVIDER: Kristian Covey, MD  REFERRING DIAG: R42 (ICD-10-CM) - Vertigo  THERAPY DIAG:  Dizziness and giddiness  ONSET DATE: 6 weeks ago   Rationale for Evaluation and Treatment: Rehabilitation  SUBJECTIVE:   SUBJECTIVE STATEMENT: Patient reports dizziness started 6 weeks ago. First began when he woke up and felt like everything was spinning when he turned his head to the L. Tried some exercises but feels that he was over-doing it as he was doing them for an hour a day. Was given a picture of exercises given to him by a friend and the next day it went away. Nausea improved with Zofran. Denies head trauma, infection/illness, vision changes/double vision, hearing loss, migraines, changes in balance. Reports baseline tinnitus.  Pt accompanied by: self  PERTINENT HISTORY: Anxiety, DM, HA, HLD, vertigo   PAIN:  Are you having pain? No  PRECAUTIONS: None  RED FLAGS: None   WEIGHT BEARING RESTRICTIONS: No  FALLS:  Has patient fallen in last 6 months? No  LIVING ENVIRONMENT: Lives with: lives alone Lives in: House/apartment Stairs:  1-2 step to enter, 1 story home Has following equipment at home: Single point cane  PLOF: Independent; retired   PATIENT GOALS: get checked out   OBJECTIVE:  Note: Objective measures were completed at Evaluation unless otherwise noted.  DIAGNOSTIC FINDINGS: 06/22/23 head CT: Normal head CT.  COGNITION: Overall cognitive status: Within functional limits for tasks assessed   SENSATION: Pt reports baseline mild N/T in B feet  GAIT: Gait pattern: WFL Assistive device utilized: None Level of assistance:  Complete Independence   FUNCTIONAL TESTS:   [facility] PT Assessment - 07/13/23 0001       Standardized Balance Assessment   Standardized Balance Assessment Dynamic Gait Index      Dynamic Gait Index   Level Surface Normal    Change in Gait Speed Normal    Gait with Horizontal Head Turns Mild Impairment    Gait with Vertical Head Turns Normal    Gait and Pivot Turn Normal    Step Over Obstacle Mild Impairment    Step Around Obstacles Normal    Steps Mild Impairment    Total Score 21             PATIENT SURVEYS:  FOTO NT- pt denied dizziness today  VESTIBULAR ASSESSMENT:  GENERAL OBSERVATION: pt wears bifocals for reading only   OCULOMOTOR EXAM:  Ocular Alignment: normal  Ocular ROM: No Limitations  Spontaneous Nystagmus: absent  Gaze-Induced Nystagmus: absent  Smooth Pursuits: intact  Saccades:  1 saccade vertically (normal for age)   VESTIBULAR - OCULAR REFLEX:   Slow VOR: Normal  VOR Cancellation: Normal  Head-Impulse Test: HIT Right: negative HIT Left: covert positive on 1st trial only     POSITIONAL TESTING:  Right Roll Test: negative Left Roll Test: negative  Right Dix-Hallpike: a few beats R upbeating torsional nystagmus but asymptomatic  Left Dix-Hallpike: negative    VESTIBULAR TREATMENT:                                                                                                   DATE: 07/13/23   PATIENT EDUCATION: Education details: POC, edu on positional vertigo and typical treatment course Person educated: Patient Education method: Explanation Education comprehension: verbalized understanding  HOME EXERCISE PROGRAM:  GOALS: Goals reviewed with patient? Yes  SHORT TERM GOALS=LTGs: Target date:  07/13/23  Patient to report understanding of exam findings and BPPV education. Baseline: - Goal status: MET    ASSESSMENT:  CLINICAL IMPRESSION:   Patient is a 77 y/o M presenting to OPPT with c/o dizziness for the past 6 weeks, which  recently resolved after doing an exercise that was given to him by a friend. Upon questioning, sounds like patient had L posterior BPPV, now asymptomatic with exam. Balance testing revealed decreased risk of falls. Patient only required 1 time visit today.   OBJECTIVE IMPAIRMENTS: decreased balance.   ACTIVITY LIMITATIONS:  none  PARTICIPATION LIMITATIONS:  none  PERSONAL FACTORS: Age, Time since onset of injury/illness/exacerbation, and 3+ comorbidities:  Anxiety, DM, HA, HLD, vertigo   are also affecting patient's functional outcome.   REHAB POTENTIAL: Good  CLINICAL DECISION MAKING: Evolving/moderate complexity  EVALUATION COMPLEXITY: Moderate   PLAN:  PT FREQUENCY: one time visit  PT DURATION: -  PLANNED INTERVENTIONS: 97110-Therapeutic exercises, 97530- Therapeutic activity, 97112- Neuromuscular re-education, 97535- Self Care, and 78295- Manual therapy  PLAN FOR NEXT SESSION: DC at this time    PHYSICAL THERAPY DISCHARGE SUMMARY  Visits from Start of Care: 1  Current functional level related to goals / functional outcomes: See above clinical impression   Remaining deficits: none   Education / Equipment: Pt education   Plan: Patient agrees to discharge.  Patient goals were  met. Patient is being discharged due to meeting the stated rehab goals.       Anette Guarneri, PT, DPT 07/13/23 9:26 AM  [facility] at [facility] [address] [place], [address] [place], [place] [address] Phone # [phone] Fax # [phone]

## 2023-07-13 ENCOUNTER — Other Ambulatory Visit: Payer: Self-pay

## 2023-07-13 ENCOUNTER — Ambulatory Visit: Payer: Medicare Other | Attending: Family Medicine | Admitting: Physical Therapy

## 2023-07-13 DIAGNOSIS — R42 Dizziness and giddiness: Secondary | ICD-10-CM | POA: Diagnosis not present

## 2023-07-14 ENCOUNTER — Other Ambulatory Visit: Payer: Self-pay | Admitting: Interventional Cardiology

## 2023-07-27 ENCOUNTER — Telehealth: Payer: Self-pay | Admitting: Family Medicine

## 2023-07-27 NOTE — Telephone Encounter (Signed)
Requesting TOC from [place] to [place].

## 2023-08-10 ENCOUNTER — Telehealth: Payer: Self-pay

## 2023-08-10 NOTE — Telephone Encounter (Signed)
Okay to schedule TOC visit, thanks!

## 2023-08-10 NOTE — Progress Notes (Unsigned)
   08/10/2023  Patient ID: Lucas Warren, male   DOB: [birth year], 77 y.o.   MRN: [id number]  Contacted patient to coordinate 2025 renewal of his Ozempic PAP and he voiced concerns on ozempic and frustration that he has not gotten any follow up on his [facility] request from Dr. Caryl Never to Dr. [place].  Is experiencing upset stomach and hoarseness and wants to be seen by a new PCP at [facility] and has self-stopped his ozempic out of concern this is the cause.  Per chart, TOC request approved by Dr. Caryl Never. Routing to Dr. [place] and team for follow up.  Sherrill Raring, PharmD Clinical Pharmacist [phone]

## 2023-08-12 ENCOUNTER — Other Ambulatory Visit (HOSPITAL_BASED_OUTPATIENT_CLINIC_OR_DEPARTMENT_OTHER): Payer: Self-pay

## 2023-08-12 ENCOUNTER — Other Ambulatory Visit: Payer: Self-pay | Admitting: Interventional Cardiology

## 2023-08-12 MED ORDER — ROSUVASTATIN CALCIUM 20 MG PO TABS: 20.0000 mg | ORAL_TABLET | Freq: Every day | ORAL | 0 refills | Status: DC

## 2023-08-12 NOTE — Telephone Encounter (Signed)
Pt has been sch for Monday 08-16-2023

## 2023-08-16 ENCOUNTER — Ambulatory Visit (INDEPENDENT_AMBULATORY_CARE_PROVIDER_SITE_OTHER): Payer: Medicare Other | Admitting: Family Medicine

## 2023-08-16 ENCOUNTER — Encounter: Payer: Self-pay | Admitting: Family Medicine

## 2023-08-16 VITALS — BP 120/70 | HR 75 | Temp 97.6°F | Resp 16 | Ht 71.0 in | Wt 183.2 lb

## 2023-08-16 DIAGNOSIS — E1122 Type 2 diabetes mellitus with diabetic chronic kidney disease: Secondary | ICD-10-CM

## 2023-08-16 DIAGNOSIS — N1831 Chronic kidney disease, stage 3a: Secondary | ICD-10-CM

## 2023-08-16 DIAGNOSIS — K5900 Constipation, unspecified: Secondary | ICD-10-CM | POA: Diagnosis not present

## 2023-08-16 DIAGNOSIS — H8112 Benign paroxysmal vertigo, left ear: Secondary | ICD-10-CM | POA: Diagnosis not present

## 2023-08-16 DIAGNOSIS — Z7985 Long-term (current) use of injectable non-insulin antidiabetic drugs: Secondary | ICD-10-CM

## 2023-08-16 DIAGNOSIS — J3089 Other allergic rhinitis: Secondary | ICD-10-CM | POA: Diagnosis not present

## 2023-08-16 LAB — POCT GLYCOSYLATED HEMOGLOBIN (HGB A1C): Hemoglobin A1C: 7.5 % — AB (ref 4.0–5.6)

## 2023-08-16 MED ORDER — SEMAGLUTIDE (1 MG/DOSE) 4 MG/3ML ~~LOC~~ SOPN: 1.0000 mg | PEN_INJECTOR | SUBCUTANEOUS | 2 refills | Status: DC

## 2023-08-16 MED ORDER — EMPAGLIFLOZIN 25 MG PO TABS: 25.0000 mg | ORAL_TABLET | Freq: Every day | ORAL | 1 refills | Status: DC

## 2023-08-16 NOTE — Assessment & Plan Note (Signed)
Problem is not adequately controlled, hemoglobin A1c today 7.5 (7.7 in 03/2023). He is concerned about Ozempic effects on appetite and taste, so decrease dose from 2 mg to 1 mg weekly. Jardiance increased from 10 mg to 25 mg daily. No changes in glipizide, metformin, or Actos for now. Regular exercise, as tolerated and healthful diet with avoidance of added sugar food intake is an important part of treatment and recommended.  Appointment with nutritionist will be arranged. Annual eye exam, periodic dental and foot care to continue. F/U in 3-4 months.

## 2023-08-16 NOTE — Progress Notes (Unsigned)
HPI: [name] is a 77 y.o. male with a PMHx significant for HTN, venous insufficiency, GERD, DM II, degenerative joint disease, CKD III, anxiety, chronic right shoulder pain, hypercholesterolemia, and normocytic anemia, among some, who is here today to establish care.  Former PCP: Lucas Peat, MD Last preventive routine visit: 06/11/2023  Chronic medical problems:   Diabetes Mellitus II:  - Checking BG at home: He checks his blood sugar every morning, and says his readings have been good.  - Medications: Currently on Ozempic 2 mg weekly, glipizide 5 mg daily, Actos 45 mg daily, Jardiance 10 mg daily, and metformin 1000 mg bid.  - He mentions he has been having a lack of taste and decreased appetite for several months while on Ozempic. He has been on it for about 2 years.   - Diet: He believes he is eating enough protein. He has a raisin or oatmeal cookie once a day. He has not seen a nutritionist for several years.  - eye exam: He had an eye exam this month. He mentions he has had cataract surgery on both eyes in the past.  - He mentions he was having slight numbness in his feet, but realized his socks were too tight. Once he changed to a different kind of socks, it went away.  - Negative for symptoms of hypoglycemia, polyuria, polydipsia, foot ulcers/trauma  Lab Results  Component Value Date   HGBA1C 7.0 (A) 03/29/2023   Lab Results  Component Value Date   MICROALBUR 12.1 (H) 06/30/2022   He is on lisinopril 10 mg daily, denies any history of hypertension and states that this medication is for renal protection. Hypertension is listed on his problem list. Negative for unusual headache, visual changes, CP, dyspnea, palpitations, focal neurologic deficit, or edema. He follows with cardiology regularly.  CKD III: He has no gross hematuria, foam in urine, or decreased urine output. He is taking lisinopril 10 mg daily for his kidney disease.  Lab Results  Component  Value Date   NA 143 06/22/2023   CL 107 06/22/2023   K 3.8 06/22/2023   CO2 22 06/22/2023   BUN 17 06/22/2023   CREATININE 1.61 (H) 06/22/2023   GFRNONAA 44 (L) 06/22/2023   CALCIUM 10.1 06/22/2023   ALBUMIN 4.1 08/28/2022   GLUCOSE 220 (H) 06/22/2023    Hyperlipidemia: Currently on rosuvastatin 20 mg daily.   Side effects from medication: none Lab Results  Component Value Date   CHOL 126 09/29/2022   HDL 51.80 09/29/2022   LDLCALC 53 09/29/2022   TRIG 106.0 09/29/2022   CHOLHDL 2 09/29/2022   Concerns today:   Rhinorrhea:  Patient complains of rhinorrhea in the mornings or after he eats for several months. He denies associated nasal congestion, but says he has a lot of postnasal drainage and phlegm in his throat. He endorses occasional difficulty swallowing.  He has tried Zyrtec, Flonase, Atrovent, and Saline irrigations, and none have helped.  No hx of allergies.  He has an appointment with ENT later this week.  He has had 2 nasal surgeries in the past.   Vertigo:  He also complains of recent episodes of vertigo for five and a half weeks. He would wake up and feel a spinning sensation. He endorses associated nausea and vomiting.  He was seen here but it didn't resolve, so he went to the ER, and was given meclizine and Zofran, which seemed to resolve the issue.   He has also been using the  Epley maneuver.   He further complains of occasional abdominal pains before having bowel movements and alleviated by defecation. He says he only has bowel movements every 3-4 days, which he attributes to decreased oral intake. He has Miralax to take if needed.  He believes this may be related to his lack of appetite.   Review of Systems  Constitutional:  Positive for appetite change and fatigue. Negative for activity change, chills and fever.  HENT:  Negative for nosebleeds, sore throat and trouble swallowing.   Respiratory:  Negative for cough and wheezing.   Gastrointestinal:   Negative for abdominal pain, nausea and vomiting.  Endocrine: Negative for cold intolerance and heat intolerance.  Genitourinary:  Negative for decreased urine volume, dysuria and hematuria.  Skin:  Negative for rash.  Allergic/Immunologic: Positive for environmental allergies.  Neurological:  Negative for syncope and facial asymmetry.  Psychiatric/Behavioral:  Negative for confusion and hallucinations.   See other pertinent positives and negatives in HPI.  Current Outpatient Medications on File Prior to Visit  Medication Sig Dispense Refill   aspirin 81 MG tablet Take 1 tablet by mouth once a week.     glipiZIDE (GLUCOTROL) 5 MG tablet Take 1 tablet (5 mg total) by mouth daily before breakfast. 90 tablet 1   glucose blood (ONETOUCH ULTRA) test strip USE TO check blood glucose twice daily AS DIRECTED 180 strip 3   ipratropium (ATROVENT) 0.06 % nasal spray Place 2 sprays each nostril up to 4 times a day for symptoms relief 15 mL 5   Lancets (ONETOUCH DELICA PLUS LANCET33G) MISC USE ONE TO CHECK GLUCOSE TWICE DAILY 180 each 3   levocetirizine (XYZAL) 5 MG tablet TAKE 1 TABLET BY MOUTH EACH EVENING 30 tablet 10   lisinopril (ZESTRIL) 10 MG tablet Take 1 tablet (10 mg total) by mouth every evening. 90 tablet 1   meclizine (ANTIVERT) 25 MG tablet Take 1 tablet (25 mg total) by mouth 3 (three) times daily as needed for dizziness. 20 tablet 0   metFORMIN (GLUCOPHAGE) 1000 MG tablet Take 1 tablet (1,000 mg total) by mouth 2 (two) times daily with a meal. 180 tablet 1   nitroGLYCERIN (NITROSTAT) 0.4 MG SL tablet Take one tablet sublingual as needed for chest pain up to three doses as needed. 20 tablet 0   ondansetron (ZOFRAN-ODT) 8 MG disintegrating tablet 8mg  ODT q4 hours prn nausea 12 tablet 0   pioglitazone (ACTOS) 45 MG tablet Take 1 tablet (45 mg total) by mouth every morning. 90 tablet 1   rosuvastatin (CRESTOR) 20 MG tablet Take 1 tablet (20 mg total) by mouth at bedtime. PLEASE KEEP JANUARY  VISIT FOR FURTHER REFILLS. 90 tablet 0   tadalafil (CIALIS) 20 MG tablet TAKE ONE TABLET BY MOUTH every other DAY AS NEEDED 30 tablet 3   vitamin B-12 (CYANOCOBALAMIN) 1000 MCG tablet Take 1,000 mcg by mouth at bedtime.      No current facility-administered medications on file prior to visit.    Past Medical History:  Diagnosis Date   Acute URI 10/10/2012   Allergy    seasonal   Anxiety    Cataract    Chest pain, atypical    Colon polyps    hyperplastic 2003   DJD (degenerative joint disease)    DM (diabetes mellitus) (HCC)    GERD (gastroesophageal reflux disease)    History of headache    Hypercholesteremia    Psoriasis    Vertigo    Allergies  Allergen Reactions  Lisinopril Cough   Erythromycin     REACTION: abd pain   Trulicity [Dulaglutide] Nausea Only    Family History  Problem Relation Age of Onset   Lung cancer Mother    Alzheimer's disease Father    Heart disease Father    Leukemia Sister    Allergic rhinitis Neg Hx    Asthma Neg Hx    Eczema Neg Hx    Urticaria Neg Hx    Colon cancer Neg Hx    Colon polyps Neg Hx    Esophageal cancer Neg Hx    Rectal cancer Neg Hx    Stomach cancer Neg Hx     Social History   Socioeconomic History   Marital status: Divorced    Spouse name: carolyn   Number of children: Not on file   Years of education: Not on file   Highest education level: GED or equivalent  Occupational History    Comment: Curator  Tobacco Use   Smoking status: Former    Current packs/day: 0.00    Average packs/day: 2.0 packs/day for 20.0 years (40.0 ttl pk-yrs)    Types: Cigarettes    Start date: 08/31/1961    Quit date: 08/31/1981    Years since quitting: 41.9   Smokeless tobacco: Never  Vaping Use   Vaping status: Never Used  Substance and Sexual Activity   Alcohol use: Yes    Comment: rarely a beer   Drug use: Never   Sexual activity: Not Currently  Other Topics Concern   Not on file  Social History Narrative    Not on file   Social Drivers of Health   Financial Resource Strain: Low Risk  (05/05/2023)   Overall Financial Resource Strain (CARDIA)    Difficulty of Paying Living Expenses: Not hard at all  Food Insecurity: No Food Insecurity (05/05/2023)   Hunger Vital Sign    Worried About Running Out of Food in the Last Year: Never true    Ran Out of Food in the Last Year: Never true  Transportation Needs: No Transportation Needs (05/05/2023)   PRAPARE - Administrator, Civil Service (Medical): No    Lack of Transportation (Non-Medical): No  Physical Activity: Sufficiently Active (05/05/2023)   Exercise Vital Sign    Days of Exercise per Week: 5 days    Minutes of Exercise per Session: 30 min  Stress: No Stress Concern Present (05/05/2023)   [employer] of Occupational Health - Occupational Stress Questionnaire    Feeling of Stress : Not at all  Social Connections: Moderately Integrated (05/05/2023)   Social Connection and Isolation Panel [NHANES]    Frequency of Communication with Friends and Family: More than three times a week    Frequency of Social Gatherings with Friends and Family: More than three times a week    Attends Religious Services: More than 4 times per year    Active Member of [employer] or Organizations: Yes    Attends Banker Meetings: More than 4 times per year    Marital Status: Divorced    Vitals:   08/16/23 1510  BP: 120/70  Pulse: 75  Temp: 97.6 F (36.4 C)  SpO2: 99%    Body mass index is 25.56 kg/m.  Physical Exam Vitals and nursing note reviewed.  Constitutional:      General: He is not in acute distress.    Appearance: He is well-developed.  HENT:     Head: Normocephalic and atraumatic.  Mouth/Throat:     Mouth: Mucous membranes are moist.     Dentition: Has dentures.     Pharynx: Uvula midline. Postnasal drip present.  Eyes:     Conjunctiva/sclera: Conjunctivae normal.  Cardiovascular:     Rate and Rhythm: Normal rate and  regular rhythm.     Pulses:          Dorsalis pedis pulses are 2+ on the right side and 2+ on the left side.     Heart sounds: No murmur heard. Pulmonary:     Effort: Pulmonary effort is normal. No respiratory distress.     Breath sounds: Normal breath sounds.  Abdominal:     Palpations: Abdomen is soft. There is no hepatomegaly or mass.     Tenderness: There is no abdominal tenderness.  Musculoskeletal:     Right lower leg: No edema.     Left lower leg: No edema.  Lymphadenopathy:     Cervical: No cervical adenopathy.  Skin:    General: Skin is warm.     Findings: No erythema or rash.  Neurological:     Mental Status: He is alert and oriented to person, place, and time.     Cranial Nerves: No cranial nerve deficit.     Gait: Gait normal.  Psychiatric:        Mood and Affect: Affect normal. Mood is anxious.   ASSESSMENT AND PLAN:  Mr. Hemsworth was seen today for transfer of care.   Type 2 diabetes mellitus with stage 3a chronic kidney disease, without long-term current use of insulin (HCC) Assessment & Plan: Problem is not adequately controlled, hemoglobin A1c today 7.5 (7.7 in 03/2023). He is concerned about Ozempic effects on appetite and taste, so decrease dose from 2 mg to 1 mg weekly. Jardiance increased from 10 mg to 25 mg daily. No changes in glipizide, metformin, or Actos for now. Regular exercise, as tolerated and healthful diet with avoidance of added sugar food intake is an important part of treatment and recommended.  Appointment with nutritionist will be arranged. Annual eye exam, periodic dental and foot care to continue. F/U in 3-4 months.  Orders: -     POCT glycosylated hemoglobin (Hb A1C) -     Basic metabolic panel; Future -     Microalbumin / creatinine urine ratio; Future -     Empagliflozin; Take 1 tablet (25 mg total) by mouth daily.  Dispense: 90 tablet; Refill: 1 -     Semaglutide (1 MG/DOSE); Inject 1 mg as directed once a week.  Dispense: 3 mL;  Refill: 2 -     Amb Referral to Nutrition and Diabetic Education  Stage 3a chronic kidney disease (HCC) Assessment & Plan: Problem has been stable, creatinine.6 and e GFR mid 40's low 50's. For now no changes in some of his medications. Continue adequate hydration, low-salt diet, and avoidance of NSAIDs.   Constipation, unspecified constipation type -     TSH; Future  Benign paroxysmal positional vertigo of left ear  Perennial allergic rhinitis Assessment & Plan: We discussed differential diagnosis. Problem is not well-controlled, he has tried several medications unsuccessfully. He has an appointment with ENT in a few weeks.   I spent a total of 46 minutes in both face to face and non face to face activities for this visit on the date of this encounter. During this time history was obtained and documented, examination was performed, prior labs/imaging reviewed***, and assessment/plan discussed.  Return in about 3 months (around  11/17/2023) for chronic problems.  I, Rolla Etienne Wierda, acting as a scribe for Deshia Vanderhoof [place], MD., have documented all relevant documentation on the behalf of Janazia Schreier [place], MD, as directed by  Laveda Demedeiros [place], MD while in the presence of Lorenzo Pereyra [place], MD.   I, Dema Timmons [place], MD, have reviewed all documentation for this visit. The documentation on 08/16/23 for the exam, diagnosis, procedures, and orders are all accurate and complete.  Derec Mozingo G. [place], MD  [facility]. [place] office.

## 2023-08-16 NOTE — Assessment & Plan Note (Signed)
Problem has been stable, creatinine.6 and e GFR mid 40's low 50's. For now no changes in some of his medications. Continue adequate hydration, low-salt diet, and avoidance of NSAIDs.

## 2023-08-16 NOTE — Patient Instructions (Addendum)
A few things to remember from today's visit:  Stage 3a chronic kidney disease (HCC)  Type 2 diabetes mellitus with stage 3a chronic kidney disease, without long-term current use of insulin (HCC) - Plan: POC HgB A1c, Basic metabolic panel, Microalbumin / creatinine urine ratio, empagliflozin (JARDIANCE) 25 MG TABS tablet, Semaglutide, 1 MG/DOSE, 4 MG/3ML SOPN  Constipation, unspecified constipation type - Plan: TSH  Benign paroxysmal positional vertigo of left ear  Rhinorrhea Increase Jardiance to 25 mg and Ozempic to 1 mg weekly. Rest unchanged.  If you need refills for medications you take chronically, please call your pharmacy. Do not use My Chart to request refills or for acute issues that need immediate attention. If you send a my chart message, it may take a few days to be addressed, specially if I am not in the office.  Please be sure medication list is accurate. If a new problem present, please set up appointment sooner than planned today.

## 2023-08-16 NOTE — Assessment & Plan Note (Signed)
We discussed differential diagnosis. Problem is not well-controlled, he has tried several medications unsuccessfully. He has an appointment with ENT in a few weeks.

## 2023-08-17 LAB — BASIC METABOLIC PANEL
BUN: 25 mg/dL — ABNORMAL HIGH (ref 6–23)
CO2: 25 meq/L (ref 19–32)
Calcium: 9.7 mg/dL (ref 8.4–10.5)
Chloride: 107 meq/L (ref 96–112)
Creatinine, Ser: 1.53 mg/dL — ABNORMAL HIGH (ref 0.40–1.50)
GFR: 44.17 mL/min — ABNORMAL LOW (ref >60.00)
Glucose, Bld: 149 mg/dL — ABNORMAL HIGH (ref 70–99)
Potassium: 4.4 meq/L (ref 3.5–5.1)
Sodium: 143 meq/L (ref 135–145)

## 2023-08-17 LAB — TSH: TSH: 0.63 u[IU]/mL (ref 0.35–5.50)

## 2023-08-17 LAB — MICROALBUMIN / CREATININE URINE RATIO
Creatinine,U: 64.2 mg/dL
Microalb Creat Ratio: 11.2 mg/g (ref 0.0–30.0)
Microalb, Ur: 7.2 mg/dL — ABNORMAL HIGH (ref 0.0–1.9)

## 2023-08-19 ENCOUNTER — Ambulatory Visit (INDEPENDENT_AMBULATORY_CARE_PROVIDER_SITE_OTHER): Payer: Medicare Other | Admitting: Otolaryngology

## 2023-08-19 ENCOUNTER — Encounter (INDEPENDENT_AMBULATORY_CARE_PROVIDER_SITE_OTHER): Payer: Self-pay

## 2023-08-19 VITALS — Ht 72.0 in | Wt 183.0 lb

## 2023-08-19 DIAGNOSIS — J383 Other diseases of vocal cords: Secondary | ICD-10-CM

## 2023-08-19 DIAGNOSIS — R490 Dysphonia: Secondary | ICD-10-CM | POA: Diagnosis not present

## 2023-08-19 DIAGNOSIS — J300 Vasomotor rhinitis: Secondary | ICD-10-CM | POA: Diagnosis not present

## 2023-08-19 DIAGNOSIS — R0982 Postnasal drip: Secondary | ICD-10-CM | POA: Diagnosis not present

## 2023-08-19 DIAGNOSIS — Z9889 Other specified postprocedural states: Secondary | ICD-10-CM

## 2023-08-19 MED ORDER — IPRATROPIUM BROMIDE 0.06 % NA SOLN: NASAL | 5 refills | Status: DC

## 2023-08-19 MED ORDER — AZELASTINE HCL 0.1 % NA SOLN: 2.0000 | Freq: Two times a day (BID) | NASAL | 12 refills | Status: DC

## 2023-08-19 NOTE — Progress Notes (Signed)
Dear Dr. Swaziland, Here is my assessment for our mutual patient, Lucas Warren. Thank you for allowing me the opportunity to care for your patient. Please do not hesitate to contact me should you have any other questions. Sincerely, Dr. Jovita Kussmaul  Otolaryngology Clinic Note Referring provider: Dr. [place]and HPI:  Lucas Warren is a 77 y.o. male kindly referred by Dr. Swaziland for evaluation of sinonasal issues.  He reports that he primarily has a nasal dripping problem. He reports that he has bilateral mucoid rhinorrhea which is persistent, significantly worsening with eating (any kind of food, maybe worse with spicy food but has not specifically noticed). He reports that it has been doing this for several years. He saw Dr. Jearld Fenton in 2020, but did not help with nasacort or allegra. He reports that it is embarrassing. He has tried Xyzal, Astelin, Nasacort, Flonase, Nasal saline spray, and atrovent 0.06%. He has not tried everything at once. Nothing really works.  No headaches, not induced by head tilt or valsalva  Had vertigo but recovered. No hearing loss or fullness, or drainage. Suspected BPPV, did vestibular Rx  He also reports some hoarseness which has been ongoing for a few weeks. Did have URI few weeks ago. Denies obvious GERD Sx. Worse at end of day. No cough Patient otherwise denies: - dysphagia, odynophagia, aspiration episodes or PNA, need for Heimlich, unintentional weight loss - changes in voice, shortness of breath, hemoptysis - ear pain, neck masses  He does not report frequent sinus infections or CRS symptoms including pain/pressure, discolored drainage, congestion, discolored drainage.  He has had prior allergy testing and CT.  PMHx: T2DM, GERD, CKD, HLD  H&N Surgery: Sinus surgery for deviated septum (done several years ago in [place]) Personal or FHx of bleeding dz or anesthesia difficulty: no  AP/AC: ASA 81 once per week GLP-1: on ozempic for DM  Tobacco: no. Lives  in [place], [place]  Independent Review of Additional Tests or Records:  Dr. Caryl Never (06/10/2022): vertigo - woke up with spinning, some nausea and 1 episode of vomiting; no headaches, hearing change, URI sx, no other sx. Meclizine helped. Suspected BPPV, noted horizontal nystagmus; Discussed epley, Meclizine PRN ED Notes Dr. Judd Lien (06/22/2023): Vertigo for 3 weeks, mostly when waking up and changing position; no other sx; CT done, Rx meclizine Vestibular rehab 07/13/2023: VOR noted normal, Left HIT covert positive; R right hallpike few beats R upbeating; Dx likely L posterior BPPV; now Asx, discharged Dr. Swaz[place]and 08/16/2023: vertigo hsa resolved. Using epley; now just complains of rhinorrhea in mornings or after he eats; no congestion just PND; tried zyrtec, flonase, atrovent, irrigations, nthing helped. Reports negative allergy test. Two prior nasal surgery [facility] 06/22/2023: reviewed independently, prior b/l max and loks like at least anterior ethmoidectomy; no significant opacification; mastoids and ME well aerated; given cut thickness can't assess superior SCC well but no noted otic capsule abnormality Dr. Jearld Fenton (06/2019): post nasal drip for a while, tried OTC meds and when he eats. Rx: allegra, nasacort, suspected vasomotor rhinitis. Dr. Delorse Lek (Allergy 05/06/2022) - for rhinitis, tried atrovent 0.06% but using twice per; tested for allergies, did not work significantly. Allergy testing 05/29/2022: positive for grass pollen, tree pollen, dust mite  PMH/Meds/All/SocHx/FamHx/ROS:   Past Medical History:  Diagnosis Date   Acute URI 10/10/2012   Allergy    seasonal   Anxiety    Cataract    Chest pain, atypical    Colon polyps    hyperplastic 2003   DJD (degenerative joint disease)  DM (diabetes mellitus) (HCC)    GERD (gastroesophageal reflux disease)    History of headache    Hypercholesteremia    Psoriasis    Vertigo      Past Surgical History:  Procedure Laterality Date   CATARACT  EXTRACTION, BILATERAL Bilateral    Dr. Darel Hong;    COLONOSCOPY     INGUINAL HERNIA REPAIR  1989   LEFT HEART CATH AND CORONARY ANGIOGRAPHY N/A 05/20/2020   Procedure: LEFT HEART CATH AND CORONARY ANGIOGRAPHY;  Surgeon: Corky Crafts, MD;  Location: [facility] INVASIVE CV LAB;  Service: Cardiovascular;  Laterality: N/A;   NASAL SEPTUM SURGERY  1989   SINOSCOPY     TONSILLECTOMY      Family History  Problem Relation Age of Onset   Lung cancer Mother    Alzheimer's disease Father    Heart disease Father    Leukemia Sister    Allergic rhinitis Neg Hx    Asthma Neg Hx    Eczema Neg Hx    Urticaria Neg Hx    Colon cancer Neg Hx    Colon polyps Neg Hx    Esophageal cancer Neg Hx    Rectal cancer Neg Hx    Stomach cancer Neg Hx      Social Connections: Moderately Integrated (05/05/2023)   Social Connection and Isolation Panel [NHANES]    Frequency of Communication with Friends and Family: More than three times a week    Frequency of Social Gatherings with Friends and Family: More than three times a week    Attends Religious Services: More than 4 times per year    Active Member of [employer] or Organizations: Yes    Attends Engineer, structural: More than 4 times per year    Marital Status: Divorced      Current Outpatient Medications:    aspirin 81 MG tablet, Take 1 tablet by mouth once a week., Disp: , Rfl:    azelastine (ASTELIN) 0.1 % nasal spray, Place 2 sprays into both nostrils 2 (two) times daily. Use in each nostril as directed, Disp: 30 mL, Rfl: 12   empagliflozin (JARDIANCE) 25 MG TABS tablet, Take 1 tablet (25 mg total) by mouth daily., Disp: 90 tablet, Rfl: 1   glipiZIDE (GLUCOTROL) 5 MG tablet, Take 1 tablet (5 mg total) by mouth daily before breakfast., Disp: 90 tablet, Rfl: 1   glucose blood (ONETOUCH ULTRA) test strip, USE TO check blood glucose twice daily AS DIRECTED, Disp: 180 strip, Rfl: 3   Lancets (ONETOUCH DELICA PLUS LANCET33G) MISC, USE ONE TO CHECK  GLUCOSE TWICE DAILY, Disp: 180 each, Rfl: 3   levocetirizine (XYZAL) 5 MG tablet, TAKE 1 TABLET BY MOUTH EACH EVENING, Disp: 30 tablet, Rfl: 10   lisinopril (ZESTRIL) 10 MG tablet, Take 1 tablet (10 mg total) by mouth every evening., Disp: 90 tablet, Rfl: 1   meclizine (ANTIVERT) 25 MG tablet, Take 1 tablet (25 mg total) by mouth 3 (three) times daily as needed for dizziness., Disp: 20 tablet, Rfl: 0   metFORMIN (GLUCOPHAGE) 1000 MG tablet, Take 1 tablet (1,000 mg total) by mouth 2 (two) times daily with a meal., Disp: 180 tablet, Rfl: 1   nitroGLYCERIN (NITROSTAT) 0.4 MG SL tablet, Take one tablet sublingual as needed for chest pain up to three doses as needed., Disp: 20 tablet, Rfl: 0   ondansetron (ZOFRAN-ODT) 8 MG disintegrating tablet, 8mg  ODT q4 hours prn nausea, Disp: 12 tablet, Rfl: 0   pioglitazone (ACTOS) 45 MG tablet,  Take 1 tablet (45 mg total) by mouth every morning., Disp: 90 tablet, Rfl: 1   rosuvastatin (CRESTOR) 20 MG tablet, Take 1 tablet (20 mg total) by mouth at bedtime. PLEASE KEEP JANUARY VISIT FOR FURTHER REFILLS., Disp: 90 tablet, Rfl: 0   Semaglutide, 1 MG/DOSE, 4 MG/3ML SOPN, Inject 1 mg as directed once a week., Disp: 3 mL, Rfl: 2   tadalafil (CIALIS) 20 MG tablet, TAKE ONE TABLET BY MOUTH every other DAY AS NEEDED, Disp: 30 tablet, Rfl: 3   vitamin B-12 (CYANOCOBALAMIN) 1000 MCG tablet, Take 1,000 mcg by mouth at bedtime. , Disp: , Rfl:    ipratropium (ATROVENT) 0.06 % nasal spray, Place 2 sprays each nostril up to 4 times a day for symptoms relief, Disp: 15 mL, Rfl: 5   Physical Exam:   Ht 6' (1.829 m)   Wt 183 lb (83 kg)   BMI 24.82 kg/m   Salient findings:  CN II-XII intact  Bilateral EAC clear and TM intact with well pneumatized middle ear spaces Anterior rhinoscopy: Septum midline; bilateral inferior turbinates with postsurgical changes and reduced; Nasal endoscopy was indicated to better evaluate the nose and paranasal sinuses, given the patient's history and  exam findings and post-surgical changes, and is detailed below. Head tilt negative No lesions of oral cavity/oropharynx No obviously palpable neck masses/lymphadenopathy/thyromegaly No respiratory distress or stridor; voice quality class 2 - given history and complaints, TFL was indicated  Seprately Identifiable Procedures:  PROCEDURE: Bilateral Diagnostic Rigid Nasal Endoscopy Pre-procedure diagnosis: Rhinitis, concern for structural lesion or CSF leak Post-procedure diagnosis: same Indication: See pre-procedure diagnosis and physical exam above Complications: None apparent EBL: 0 mL Anesthesia: Lidocaine 4% and topical decongestant was topically sprayed in each nasal cavity  Description of Procedure:  Patient was identified. A rigid 30 degree endoscope was utilized to evaluate the sinonasal cavities, mucosa, sinus ostia and turbinates and septum.  Overall, signs of mucosal inflammation are not noted.  Also noted are post-surgical changes - bilateral turbinates resected partially; max and ethmoids patent b/l no crusting; right partial middle turbinectomy.  No mucopurulence, polyps, or masses noted.  No evidence of active CSF leak on valsalva Right Middle meatus: clear Right SE Recess: clear Left MM: clear Left SE Recess: clear Photodocumentation was obtained.  CPT CODE -- 60454 - Mod 25  Procedure Note Pre-procedure diagnosis:  Dysphonia Post-procedure diagnosis: Same Procedure: Transnasal Fiberoptic Laryngoscopy, CPT 31575 - Mod 25 Indication: dysphonia Complications: None apparent EBL: 0 mL  The procedure was undertaken to further evaluate the patient's complaint of dysphonia, with mirror exam inadequate for appropriate examination due to gag reflex and poor patient tolerance  Procedure:  Patient was identified as correct patient. Verbal consent was obtained. The nose was sprayed with oxymetazoline and 4% lidocaine. The The flexible laryngoscope was passed through the nose to  view the nasal cavity, pharynx (oropharynx, hypopharynx) and larynx.  The larynx was examined at rest and during multiple phonatory tasks. Documentation was obtained and reviewed with patient. The scope was removed. The patient tolerated the procedure well.  Findings: The nasal cavity and nasopharynx did not reveal any masses or lesions, mucosa appeared to be without obvious lesions. The tongue base, pharyngeal walls, piriform sinuses, vallecula, epiglottis and postcricoid region are normal in appearance. The visualized portion of the subglottis and proximal trachea is widely patent. The vocal folds are mobile bilaterally. There are no lesions on the free edge of the vocal folds nor elsewhere in the larynx worrisome for malignancy. No significant  retained secretions; R > L VF age appropriate atrophy  Electronically signed by: Read Drivers, MD 08/19/2023 12:46 PM   Impression & Plans:  Oluwatoni Melendrez is a 77 y.o. male with:  1. Vasomotor rhinitis   2. Dysphonia   3. S/P FESS (functional endoscopic sinus surgery)   4. Vocal fold atrophy    Primary complaint is really rhinitis with eating, though it is occurring at all the time. Based on symptoms and exam, do not think this is CSF as he has no other symptoms. Ddx most likely includes vasomotor rhinitis v/s NARES or other rhinitis. We discussed options and although he has tried various sprays, nothing all at once so we will maximally medically manage him. I also explained possible clarifix but will see how he responds. He is understandably frustrated - Start flonase BID - Start Astelin BID - Start atrovent 0.06% BID - consider B2 testing if not resolved  Dysphonia: query LPR with contribution from atrophy; he also had a URI so could be post-viral - discussed mgmt including voice Rx, reflux meds and gourmet but will just observe for now - some age-related vocal fold atrophy  8 weeks (f/u)  See below regarding exact medications prescribed this  encounter including dosages and route: Meds ordered this encounter  Medications   azelastine (ASTELIN) 0.1 % nasal spray    Sig: Place 2 sprays into both nostrils 2 (two) times daily. Use in each nostril as directed    Dispense:  30 mL    Refill:  12   ipratropium (ATROVENT) 0.06 % nasal spray    Sig: Place 2 sprays each nostril up to 4 times a day for symptoms relief    Dispense:  15 mL    Refill:  5      Thank you for allowing me the opportunity to care for your patient. Please do not hesitate to contact me should you have any other questions.  Sincerely, Jovita Kussmaul, MD Otolarynoglogist (ENT), [facility] Phone: [phone] Fax: [phone]  08/19/2023, 12:46 PM   MDM:  Level 4 Complexity/Problems addressed: multiple chronic problems - one with exacerbation Data complexity: mod - independent review of CT, labs, prior notes and results - Morbidity: mod  - Prescription Drug prescribed or managed: yes

## 2023-08-19 NOTE — Patient Instructions (Signed)
Use flonase two sprays each nostril twice per day Use atrovent spray two sprays each nostril two or three times per day Use astelin spray two sprays each nostril two times per day

## 2023-08-20 ENCOUNTER — Telehealth: Payer: Self-pay

## 2023-08-20 NOTE — Telephone Encounter (Signed)
Copied from CRM [id number]. Topic: Clinical - Prescription Issue >> Aug 20, 2023  1:17 PM Tiffany H wrote: Reason for CRM: Patient called to speak with Marylene Land about his Ozempic prescription. Please assist.

## 2023-08-20 NOTE — Telephone Encounter (Signed)
Contacted patient. Instructed patient on how to dial to 1mg  dose of Ozempic on the 2mg  pens. Patient expressed understanding. Patient is due for 2025 renewal on his Ozempic assistance. Will prepare renewal paperwork and patient plans to come by to sign next week.   Sherrill Raring, PharmD Clinical Pharmacist [phone]

## 2023-09-02 ENCOUNTER — Other Ambulatory Visit (HOSPITAL_BASED_OUTPATIENT_CLINIC_OR_DEPARTMENT_OTHER): Payer: Self-pay | Admitting: Interventional Cardiology

## 2023-09-13 ENCOUNTER — Other Ambulatory Visit: Payer: Self-pay | Admitting: Family Medicine

## 2023-09-15 ENCOUNTER — Other Ambulatory Visit: Payer: Self-pay | Admitting: Family Medicine

## 2023-09-23 ENCOUNTER — Telehealth: Payer: Self-pay | Admitting: *Deleted

## 2023-09-23 NOTE — Telephone Encounter (Signed)
Samples of Ozemic, from patient's assistance,  is available and the patient is aware.

## 2023-09-28 ENCOUNTER — Other Ambulatory Visit: Payer: Self-pay | Admitting: Family Medicine

## 2023-09-28 NOTE — Telephone Encounter (Unsigned)
Copied from CRM [id number]. Topic: Clinical - Medication Refill >> Sep 28, 2023  3:31 PM Leavy Cella D wrote: Most Recent Primary Care Visit:  Provider: [place], BETTY G  Department: [facility]  Visit Type: TRANSFER OF CARE  Date: 08/16/2023  Medication: meclizine (ANTIVERT) 25 MG tablet and ondansetron (ZOFRAN-ODT) 8 MG disintegrating tablet  Has the patient contacted their pharmacy? Yes (Agent: If no, request that the patient contact the pharmacy for the refill. If patient does not wish to contact the pharmacy document the reason why and proceed with request.) (Agent: If yes, when and what did the pharmacy advise?)  Is this the correct pharmacy for this prescription? Yes If no, delete pharmacy and type the correct one.  This is the patient's preferred pharmacy:  [facility] - [place], [place] - [address] [place] [address] Phone: [phone] Fax: [phone]     Has the prescription been filled recently? No  Is the patient out of the medication? Yes  Has the patient been seen for an appointment in the last year OR does the patient have an upcoming appointment? Yes  Can we respond through MyChart? No  Agent: Please be advised that Rx refills may take up to 3 business days. We ask that you follow-up with your pharmacy.

## 2023-09-30 ENCOUNTER — Ambulatory Visit: Payer: Medicare Other | Admitting: Physician Assistant

## 2023-10-13 ENCOUNTER — Telehealth (INDEPENDENT_AMBULATORY_CARE_PROVIDER_SITE_OTHER): Payer: Self-pay | Admitting: Otolaryngology

## 2023-10-13 NOTE — Telephone Encounter (Signed)
Confirmed appt and address with patient for 10/14/2023.

## 2023-10-14 ENCOUNTER — Encounter: Payer: Medicare Other | Attending: Family Medicine | Admitting: Dietician

## 2023-10-14 ENCOUNTER — Ambulatory Visit (INDEPENDENT_AMBULATORY_CARE_PROVIDER_SITE_OTHER): Payer: Medicare Other | Admitting: Otolaryngology

## 2023-10-14 ENCOUNTER — Encounter: Payer: Self-pay | Admitting: Dietician

## 2023-10-14 VITALS — Ht 72.0 in | Wt 186.0 lb

## 2023-10-14 VITALS — BP 129/64 | HR 70

## 2023-10-14 DIAGNOSIS — J383 Other diseases of vocal cords: Secondary | ICD-10-CM

## 2023-10-14 DIAGNOSIS — E1122 Type 2 diabetes mellitus with diabetic chronic kidney disease: Secondary | ICD-10-CM | POA: Diagnosis not present

## 2023-10-14 DIAGNOSIS — J300 Vasomotor rhinitis: Secondary | ICD-10-CM | POA: Diagnosis not present

## 2023-10-14 DIAGNOSIS — N1831 Chronic kidney disease, stage 3a: Secondary | ICD-10-CM | POA: Diagnosis not present

## 2023-10-14 DIAGNOSIS — R490 Dysphonia: Secondary | ICD-10-CM | POA: Diagnosis not present

## 2023-10-14 DIAGNOSIS — Z9889 Other specified postprocedural states: Secondary | ICD-10-CM

## 2023-10-14 DIAGNOSIS — R0982 Postnasal drip: Secondary | ICD-10-CM | POA: Diagnosis not present

## 2023-10-14 MED ORDER — IPRATROPIUM BROMIDE 0.06 % NA SOLN: NASAL | 5 refills | Status: DC

## 2023-10-14 MED ORDER — AZELASTINE HCL 0.1 % NA SOLN: 2.0000 | Freq: Two times a day (BID) | NASAL | 12 refills | Status: AC

## 2023-10-14 NOTE — Progress Notes (Signed)
 Dear Dr. Swaziland, Here is my assessment for our mutual patient, Lucas Warren. Thank you for allowing me the opportunity to care for your patient. Please do not hesitate to contact me should you have any other questions. Sincerely, Dr. Jovita Kussmaul  Otolaryngology Clinic Note Referring provider: Dr. Swaziland HPI:  Lucas Warren is a 77 y.o. male kindly referred by Dr. Swaziland for evaluation of sinonasal issues.  Initial visit (08/2023): He reports that he primarily has a nasal dripping problem. He reports that he has bilateral mucoid rhinorrhea which is persistent, significantly worsening with eating (any kind of food, maybe worse with spicy food but has not specifically noticed). He reports that it has been doing this for several years. He saw Dr. Jearld Fenton in 2020, but did not help with nasacort or allegra. He reports that it is embarrassing. He has tried Xyzal, Astelin, Nasacort, Flonase, Nasal saline spray, and atrovent 0.06%. He has not tried everything at once. Nothing really works.  No headaches, not induced by head tilt or valsalva  Had vertigo but recovered. No hearing loss or fullness, or drainage. Suspected BPPV, did vestibular Rx and now resolved.  He also reports some hoarseness which has been ongoing for a few weeks. Did have URI few weeks ago. Denies obvious GERD Sx. Worse at end of day. No cough Patient otherwise denies: - dysphagia, odynophagia, aspiration episodes or PNA, need for Heimlich, unintentional weight loss - changes in voice, shortness of breath, hemoptysis - ear pain, neck masses  He does not report frequent sinus infections or CRS symptoms including pain/pressure, discolored drainage, congestion, discolored drainage.  He has had prior allergy testing and CT. --------------------------------------------------------- 10/14/2023 Doing better on sprays initially, esp with atrovent. Dripping was much better, but then had some blood tinged drainage so stopped. Dripping  re-started afterwards. This continues to bother him significantly. Voice ipmroved   PMHx: T2DM, GERD, CKD, HLD  H&N Surgery: Sinus surgery for deviated septum (done several years ago in [place]) Personal or FHx of bleeding dz or anesthesia difficulty: no  AP/AC: ASA 81 once per week GLP-1: on ozempic for DM  Tobacco: no. Lives in [place], [place]  Independent Review of Additional Tests or Records:  Dr. Caryl Never (06/10/2022): vertigo - woke up with spinning, some nausea and 1 episode of vomiting; no headaches, hearing change, URI sx, no other sx. Meclizine helped. Suspected BPPV, noted horizontal nystagmus; Discussed epley, Meclizine PRN ED Notes Dr. Judd Lien (06/22/2023): Vertigo for 3 weeks, mostly when waking up and changing position; no other sx; CT done, Rx meclizine Vestibular rehab 07/13/2023: VOR noted normal, Left HIT covert positive; R right hallpike few beats R upbeating; Dx likely L posterior BPPV; now Asx, discharged Dr. Swaziland 08/16/2023: vertigo hsa resolved. Using epley; now just complains of rhinorrhea in mornings or after he eats; no congestion just PND; tried zyrtec, flonase, atrovent, irrigations, nthing helped. Reports negative allergy test. Two prior nasal surgery [facility] 06/22/2023: reviewed independently, prior b/l max and loks like at least anterior ethmoidectomy; no significant opacification; mastoids and ME well aerated; given cut thickness can't assess superior SCC well but no noted otic capsule abnormality Dr. Jearld Fenton (06/2019): post nasal drip for a while, tried OTC meds and when he eats. Rx: allegra, nasacort, suspected vasomotor rhinitis. Dr. Delorse Lek (Allergy 05/06/2022) - for rhinitis, tried atrovent 0.06% but using twice per; tested for allergies, did not work significantly. Allergy testing 05/29/2022: positive for grass pollen, tree pollen, dust mite  PMH/Meds/All/SocHx/FamHx/ROS:   Past Medical History:  Diagnosis Date  Acute URI 10/10/2012   Allergy    seasonal    Anxiety    Cataract    Chest pain, atypical    Chronic kidney disease    Colon polyps    hyperplastic 2003   DJD (degenerative joint disease)    DM (diabetes mellitus) (HCC)    GERD (gastroesophageal reflux disease)    History of headache    Hypercholesteremia    Psoriasis    Vertigo      Past Surgical History:  Procedure Laterality Date   CATARACT EXTRACTION, BILATERAL Bilateral    Dr. Darel Hong;    COLONOSCOPY     INGUINAL HERNIA REPAIR  1989   LEFT HEART CATH AND CORONARY ANGIOGRAPHY N/A 05/20/2020   Procedure: LEFT HEART CATH AND CORONARY ANGIOGRAPHY;  Surgeon: Corky Crafts, MD;  Location: [facility];  Service: Cardiovascular;  Laterality: N/A;   NASAL SEPTUM SURGERY  1989   SINOSCOPY     TONSILLECTOMY      Family History  Problem Relation Age of Onset   Lung cancer Mother    Alzheimer's disease Father    Heart disease Father    Leukemia Sister    Allergic rhinitis Neg Hx    Asthma Neg Hx    Eczema Neg Hx    Urticaria Neg Hx    Colon cancer Neg Hx    Colon polyps Neg Hx    Esophageal cancer Neg Hx    Rectal cancer Neg Hx    Stomach cancer Neg Hx      Social Connections: Moderately Integrated (05/05/2023)   Social Connection and Isolation Panel [NHANES]    Frequency of Communication with Friends and Family: More than three times a week    Frequency of Social Gatherings with Friends and Family: More than three times a week    Attends Religious Services: More than 4 times per year    Active Member of [employer] or Organizations: Yes    Attends Engineer, structural: More than 4 times per year    Marital Status: Divorced      Current Outpatient Medications:    aspirin 81 MG tablet, Take 1 tablet by mouth once a week., Disp: , Rfl:    empagliflozin (JARDIANCE) 25 MG TABS tablet, Take 1 tablet (25 mg total) by mouth daily., Disp: 90 tablet, Rfl: 1   glipiZIDE (GLUCOTROL) 5 MG tablet, Take 1 tablet (5 mg total) by mouth daily before breakfast.,  Disp: 90 tablet, Rfl: 1   glucose blood (ONETOUCH ULTRA) test strip, USE TO check blood glucose twice daily AS DIRECTED, Disp: 180 strip, Rfl: 3   Lancets (ONETOUCH DELICA PLUS LANCET33G) MISC, USE TO test blood sugar TWICE DAILY, Disp: 420 each, Rfl: 2   levocetirizine (XYZAL) 5 MG tablet, TAKE 1 TABLET BY MOUTH EACH EVENING, Disp: 30 tablet, Rfl: 10   lisinopril (ZESTRIL) 10 MG tablet, Take 1 tablet (10 mg total) by mouth every evening., Disp: 90 tablet, Rfl: 1   meclizine (ANTIVERT) 25 MG tablet, Take 1 tablet (25 mg total) by mouth at bedtime as needed for dizziness., Disp: 20 tablet, Rfl: 0   metFORMIN (GLUCOPHAGE) 1000 MG tablet, Take 1 tablet (1,000 mg total) by mouth 2 (two) times daily with a meal., Disp: 180 tablet, Rfl: 1   nitroGLYCERIN (NITROSTAT) 0.4 MG SL tablet, Take one tablet sublingual as needed for chest pain up to three doses as needed., Disp: 20 tablet, Rfl: 0   ondansetron (ZOFRAN-ODT) 8 MG disintegrating tablet, PLACE  ONE TABLET ON TONGUE AND ALLOW TO DISSOLVE EVERY FOUR HOURS AS NEEDED FOR nausea, Disp: 12 tablet, Rfl: 0   pioglitazone (ACTOS) 45 MG tablet, Take 1 tablet (45 mg total) by mouth every morning., Disp: 90 tablet, Rfl: 1   rosuvastatin (CRESTOR) 20 MG tablet, Take 1 tablet (20 mg total) by mouth at bedtime. PLEASE KEEP JANUARY VISIT FOR FURTHER REFILLS., Disp: 90 tablet, Rfl: 0   Semaglutide, 1 MG/DOSE, 4 MG/3ML SOPN, Inject 1 mg as directed once a week., Disp: 3 mL, Rfl: 2   tadalafil (CIALIS) 20 MG tablet, TAKE ONE TABLET BY MOUTH every other DAY AS NEEDED, Disp: 30 tablet, Rfl: 3   vitamin B-12 (CYANOCOBALAMIN) 1000 MCG tablet, Take 1,000 mcg by mouth at bedtime. , Disp: , Rfl:    azelastine (ASTELIN) 0.1 % nasal spray, Place 2 sprays into both nostrils 2 (two) times daily. Use in each nostril as directed, Disp: 30 mL, Rfl: 12   ipratropium (ATROVENT) 0.06 % nasal spray, Place 2 sprays each nostril up to 4 times a day for symptoms relief, Disp: 15 mL, Rfl: 5    Physical Exam:   BP 129/64 (BP Location: Left Arm, Patient Position: Sitting, Cuff Size: Normal)   Pulse 70   SpO2 99%   Salient findings:  CN II-XII intact  Bilateral EAC clear and TM intact with well pneumatized middle ear spaces Anterior rhinoscopy: Septum midline; bilateral inferior turbinates with postsurgical changes and reduced;  Head tilt negative No lesions of oral cavity/oropharynx No obviously palpable neck masses/lymphadenopathy/thyromegaly No respiratory distress or stridor; voice quality class 2   Seprately Identifiable Procedures:  PROCEDURE: Bilateral Diagnostic Rigid Nasal Endoscopy (Prior not today) Description of Procedure:  Patient was identified. A rigid 30 degree endoscope was utilized to evaluate the sinonasal cavities, mucosa, sinus ostia and turbinates and septum.  Overall, signs of mucosal inflammation are not noted.  Also noted are post-surgical changes - bilateral turbinates resected partially; max and ethmoids patent b/l no crusting; right partial middle turbinectomy.  No mucopurulence, polyps, or masses noted.  No evidence of active CSF leak on valsalva Right Middle meatus: clear Right SE Recess: clear Left MM: clear Left SE Recess: clear Photodocumentation was obtained.  CPT CODE -- 31231 - Mod 25  Procedure Note (prior not today) Pre-procedure diagnosis:  Dysphonia Post-procedure diagnosis: Same Procedure: Transnasal Fiberoptic Laryngoscopy, CPT 31575 - Mod 25 Indication: dysphonia Complications: None apparent EBL: 0 mL  The procedure was undertaken to further evaluate the patient's complaint of dysphonia, with mirror exam inadequate for appropriate examination due to gag reflex and poor patient tolerance  Procedure:  Patient was identified as correct patient. Verbal consent was obtained. The nose was sprayed with oxymetazoline and 4% lidocaine. The The flexible laryngoscope was passed through the nose to view the nasal cavity, pharynx  (oropharynx, hypopharynx) and larynx.  The larynx was examined at rest and during multiple phonatory tasks. Documentation was obtained and reviewed with patient. The scope was removed. The patient tolerated the procedure well.  Findings: The nasal cavity and nasopharynx did not reveal any masses or lesions, mucosa appeared to be without obvious lesions. The tongue base, pharyngeal walls, piriform sinuses, vallecula, epiglottis and postcricoid region are normal in appearance. The visualized portion of the subglottis and proximal trachea is widely patent. The vocal folds are mobile bilaterally. There are no lesions on the free edge of the vocal folds nor elsewhere in the larynx worrisome for malignancy. No significant retained secretions; R > L VF age  appropriate atrophy  Electronically signed by: Read Drivers, MD 10/24/2023 3:12 PM   Impression & Plans:  Lucas Warren is a 78 y.o. male with:  1. Vasomotor rhinitis   2. Dysphonia   3. S/P FESS (functional endoscopic sinus surgery)   4. Post-nasal drip   5. Vocal fold atrophy    Primary complaint is really rhinitis with eating, though it is occurring at all the time. Based on symptoms and exam, do not think this is CSF as he has no other symptoms. Ddx most likely includes vasomotor rhinitis v/s NARES or other rhinitis. He did improve on sprays but had some bloody drainage so stopped.  As such, we discussed repeat sprays with corrected technique v/s clarifix. Will try sprays again, if not improved enough, will try clarifix esp since this is quite bothersome and he did get some benefit with atrovent - Continue flonase BID - Continue Astelin BID - Continue atrovent 0.06% BID - can increase to up to 4 times per day if he wishes - do not think CSF given improv on atrovent and rhinorrhea is mucoid/assoc with eating  Dysphonia: query LPR with contribution from atrophy; - improved, will observe  F/u 6-8 weeks  See below regarding exact  medications prescribed this encounter including dosages and route: Meds ordered this encounter  Medications   ipratropium (ATROVENT) 0.06 % nasal spray    Sig: Place 2 sprays each nostril up to 4 times a day for symptoms relief    Dispense:  15 mL    Refill:  5   azelastine (ASTELIN) 0.1 % nasal spray    Sig: Place 2 sprays into both nostrils 2 (two) times daily. Use in each nostril as directed    Dispense:  30 mL    Refill:  12      Thank you for allowing me the opportunity to care for your patient. Please do not hesitate to contact me should you have any other questions.  Sincerely, Jovita Kussmaul, MD Otolaryngologist (ENT), [facility] Phone: [phone] Fax: [phone]  10/24/2023, 3:12 PM   MDM:  Level 4 - 99214 Complexity/Problems addressed: mod - multiple chronic problems Data complexity: low - Morbidity: mod  - Prescription Drug prescribed or managed: yes

## 2023-10-14 NOTE — Patient Instructions (Addendum)
Continue to increase exercise   Try to avoid dark sodas and drink more water or half and half tea instead

## 2023-10-14 NOTE — Patient Instructions (Signed)
Atrovent 3 times per day and other two sprays two times per day

## 2023-10-14 NOTE — Progress Notes (Signed)
Diabetes Self-Management Education  Visit Type: First/Initial  Appt. Start Time: 10:45am Appt. End Time: 11:45am  10/14/2023  Mr. Lucas Warren, identified by name and date of birth, is a 78 y.o. male with a diagnosis of Diabetes: Type 2.   ASSESSMENT   Patient is here today 10/14/23.    Referral:  Type 2 Diabetes with Stage 3 CKD  History:  Type 2 DM , GERD , Hyperlipidemia , CKD  Medications/Supplements include:  Ozempic , jardiance, glipizide, metformin , actos, meclinzine, aspirin , zofran , rosuvastatin, lisinopril, Vitamin B12  Labs noted to include:  A1C - 7.5 as of 08/16/23 increased from 7.0%  (03/29/23) GFR - 44.17 , Creatine -1.53 , BUN - 25 as of 08/15/23  Height : 6'0 Weight : 186 lbs as of 10/14/23 Lost 50#s on ozempic in the last year and a half Recently had vertigo (twice)  started in December  Patient lives alone. Eats out frequently , does know how to cook . 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 Body mass index is 25.23 kg/m.   Diabetes Self-Management Education - 10/14/23 1111       Visit Information   Visit Type First/Initial      Initial Visit   Diabetes Type Type 2    Date Diagnosed In the 1980s    Are you currently following a meal plan? No    Are you taking your medications as prescribed? Yes      Health Coping   How would you rate your overall health? Good;Fair      Psychosocial Assessment   Patient Belief/Attitude about Diabetes Motivated to manage diabetes    What is the hardest part about your diabetes right now, causing you the most concern, or is the most worrisome to you about your diabetes?    Making healty food and beverage choices    Self-care barriers None    Self-management support Doctor's office    Other persons present Patient    Patient Concerns Nutrition/Meal planning;Healthy Lifestyle    Special Needs None    Preferred Learning Style No preference indicated    Learning Readiness Contemplating    How often do you need to have someone help you when you read instructions, pamphlets, or other written materials from your doctor or pharmacy? 1 - Never    What is the last grade level you completed in school? GED      Pre-Education Assessment   Patient understands the diabetes disease and treatment process. Needs Review    Patient understands incorporating nutritional management into lifestyle. Needs Review    Patient undertands incorporating physical activity into lifestyle. Needs Review    Patient understands using medications safely. Needs Review    Patient understands monitoring blood glucose, interpreting and using results Needs Review    Patient understands prevention, detection, and treatment of acute complications. Needs Review    Patient understands prevention, detection, and treatment of chronic complications. Needs Review    Patient understands how to develop strategies to address psychosocial issues. Needs Review    Patient understands how to develop strategies to  promote health/change behavior. Needs Review      Complications   Last HgB A1C per patient/outside source 7.5 %    How often do you check your blood sugar? 1-2 times/day    Fasting Blood glucose range (mg/dL) 70-623    Postprandial Blood glucose range (mg/dL) 76-283;151-761    Number of hypoglycemic episodes per month 0   rarely   Have you had a dilated eye exam in the past 12 months? Yes    Have you had a dental exam in the past 12 months? Yes    Are you checking your feet? Yes    How many days per week are you checking your feet? 7      Dietary Intake   Breakfast sausage egg bacon biscuit or  blueberry or cinnamon muffin or home cooked  eggs and bacon or  jimmy dean sausage maple syrup pancake breakfast sandwich   From [employer]   Lunch Sandwich - pomento cheese with bologna with white wheat bread. plus a half a Dr. Reino Kent , or occasionally spam sandwich   between 12 and 2pm   Snack (afternoon) Peaches , pineapple , Apple ,  pickled beets , pickled okra , sweet corn, black olives    Customer service manager at [employer] lodge or [employer] 2 piece dark meat slaw gravy and mashed potatoes half a biscuit , or at home pinto beans onions or bowl of   peas and carrots  or frozen meals with salsbury steak and mac and cheese    Snack (evening) similar to afternoon    Beverage(s) Dr. Reino Kent , water , mellow yellow, ginger ale , light beer ( 1 every 2-4 weeks ) , cocktail ( once every 6 months), half and half tea, coffee   1-2 bottles of dr. Reino Kent a day     Activity / Exercise   Activity / Exercise Type ADL's      Patient Education   Previous Diabetes Education Yes (please comment)   when he was diagnosed in the 27s   Healthy Eating Meal options for control of blood glucose level and chronic complications.    Being Active Role of exercise on diabetes management, blood pressure control and cardiac health.    Medications Reviewed patients medication for diabetes, action, purpose, timing of dose and side effects.    Monitoring Identified appropriate SMBG and/or A1C goals.    Acute complications Taught prevention, symptoms, and  treatment of hypoglycemia - the 15 rule.    Chronic complications Assessed and discussed foot care and prevention of foot problems;Relationship between chronic complications and blood glucose control;Identified and discussed with patient  current chronic complications    Diabetes Stress and Support Identified and addressed patients feelings and concerns about diabetes;Worked with patient to identify barriers to care and solutions      Post-Education Assessment   Patient  understands the diabetes disease and treatment process. Comprehends key points    Patient understands incorporating nutritional management into lifestyle. Needs Review    Patient undertands incorporating physical activity into lifestyle. Comprehends key points    Patient understands using medications safely. Comphrehends key points    Patient understands monitoring blood glucose, interpreting and using results Comprehends key points    Patient understands prevention, detection, and treatment of acute complications. Comprehends key points    Patient understands prevention, detection, and treatment of chronic complications. Comprehends key points    Patient understands how to develop strategies to address psychosocial issues. Comprehends key points  Patient understands how to develop strategies to promote health/change behavior. Needs Review      Outcomes   Expected Outcomes Demonstrated interest in learning. Expect positive outcomes    Future DMSE PRN    Program Status Completed             Individualized Plan for Diabetes Self-Management Training:   Learning Objective:  Patient will have a greater understanding of diabetes self-management. Patient education plan is to attend individual and/or group sessions per assessed needs and concerns.   Plan:   Patient Instructions  Continue to increase exercise   Try to avoid dark sodas and drink more water or half and half tea instead     Expected Outcomes:  Demonstrated interest in learning. Expect positive outcomes  Education material provided: ADA - How to Thrive: A Guide for Your Journey with Diabetes,NKD national kidney diet - Dish up a Kidney-Friendly Meal for Patients with Chronic Kidney Disease (not on dialysis)   If problems or questions, patient to contact team via:  Phone  Future DSME appointment: PRN

## 2023-10-24 ENCOUNTER — Encounter (INDEPENDENT_AMBULATORY_CARE_PROVIDER_SITE_OTHER): Payer: Self-pay

## 2023-10-27 NOTE — Progress Notes (Signed)
 Cardiology Office Note:   Date:  11/01/2023  ID:  Lucas Warren, DOB [birth year], MRN [id number] PCP:  [place], Betty G, MD  [facility] Providers Cardiologist:  Alverda Skeans, MD Referring MD: Kristian Covey, MD  Chief Complaint/Reason for Referral: Follow-up for coronary artery disease ASSESSMENT:    1. Coronary artery disease involving native coronary artery of native heart with other form of angina pectoris (HCC)   2. Type 2 diabetes mellitus with other circulatory complication, without long-term current use of insulin (HCC)   3. Hyperlipidemia associated with type 2 diabetes mellitus (HCC)   4. Hypertension associated with diabetes (HCC)   5. Stage 3b chronic kidney disease (HCC)   6. BMI 27.0-27.9,adult   7. Shortness of breath     PLAN:   In order of problems listed above: Coronary artery disease: Continue aspirin 81 mg, Crestor 20 mg, and as needed nitroglycerin. Type 2 diabetes: Continue aspirin 81 mg, lisinopril 10 mg, Crestor 20 mg, Jardiance 25 mg, and semaglutide 1 mg q. weekly Hyperlipidemia: Check lipid panel, LFTs, LP(a) today. Hypertension: Blood pressure well-controlled today, continue lisinopril 10 mg. CKD stage III: Continue Jardiance 25 mg and lisinopril 10 mg for renal protection. Elevated BMI: Continue semaglutide 1 mg q. weekly            Dispo:  Return in about 1 year (around 10/31/2024).      Medication Adjustments/Labs and Tests Ordered: Current medicines are reviewed at length with the patient today.  Concerns regarding medicines are outlined above.  The following changes have been made:  no change   Labs/tests ordered: Orders Placed This Encounter  Procedures   Lipid panel   Lipoprotein A (LPA)   Hepatic function panel   EKG 12-Lead    Medication Changes: No orders of the defined types were placed in this encounter.   Current medicines are reviewed at length with the patient today.  The patient does not have concerns regarding  medicines.  I spent 35 minutes reviewing all clinical data during and prior to this visit including all relevant imaging studies, laboratories, clinical information from other health systems and prior notes from both Cardiology and other specialties, interviewing the patient, conducting a complete physical examination, and coordinating care in order to formulate a comprehensive and personalized evaluation and treatment plan.   History of Present Illness:       FOCUSED PROBLEM LIST:   Coronary artery disease Mild on cardiac catheterization 2021 Type 2 diabetes  Not on insulin Hyperlipidemia Hypertension CKD stage III BMI 27 Left anterior fascicular block  October 2024: Patient consents to use of AI scribe.  The patient returns for routine follow-up.  The patient was last seen about a year ago and was doing well without angina.  No changes were made to his medical regimen.  He occasionally experiences a 'little bit of a twinge' of chest pain lasting about 10 to 15 seconds while sitting, not associated with physical activity. No significant chest pain or palpitations. He has coronary artery disease with mild blockages noted on heart catheterization in 2021.  He has experienced shortness of breath, which he attributes to deconditioning following a prolonged episode of vertigo. The vertigo initially lasted five and a half weeks, during which he was less active. He plans to increase his physical activity as the weather improves, including walking and playing golf. He previously walked about three miles three times a week and felt good at that time.  He experienced vertigo for the first  time, waking up with spinning sensations. He was initially treated with meclizine in the emergency room, which resolved the symptoms within a day or so. He has had a second, shorter episode of vertigo, which he managed with the Epley maneuver, as advised by a friend. No blackouts.  No swelling in his legs but  notes a little tightness in his legs when showering at night.  He reports itching on his back, which he does not believe is related to his cardiac issues.             Current Medications: Current Meds  Medication Sig   aspirin 81 MG tablet Take 1 tablet by mouth once a week.   azelastine (ASTELIN) 0.1 % nasal spray Place 2 sprays into both nostrils 2 (two) times daily. Use in each nostril as directed   empagliflozin (JARDIANCE) 25 MG TABS tablet Take 1 tablet (25 mg total) by mouth daily.   glipiZIDE (GLUCOTROL) 5 MG tablet Take 1 tablet (5 mg total) by mouth daily before breakfast.   glucose blood (ONETOUCH ULTRA) test strip USE TO check blood glucose twice daily AS DIRECTED   ipratropium (ATROVENT) 0.06 % nasal spray Place 2 sprays each nostril up to 4 times a day for symptoms relief   Lancets (ONETOUCH DELICA PLUS LANCET33G) MISC USE TO test blood sugar TWICE DAILY   levocetirizine (XYZAL) 5 MG tablet TAKE 1 TABLET BY MOUTH EACH EVENING   lisinopril (ZESTRIL) 10 MG tablet Take 1 tablet (10 mg total) by mouth every evening.   meclizine (ANTIVERT) 25 MG tablet Take 1 tablet (25 mg total) by mouth at bedtime as needed for dizziness.   metFORMIN (GLUCOPHAGE) 1000 MG tablet Take 1 tablet (1,000 mg total) by mouth 2 (two) times daily with a meal.   nitroGLYCERIN (NITROSTAT) 0.4 MG SL tablet Take one tablet sublingual as needed for chest pain up to three doses as needed.   ondansetron (ZOFRAN-ODT) 8 MG disintegrating tablet PLACE ONE TABLET ON TONGUE AND ALLOW TO DISSOLVE EVERY FOUR HOURS AS NEEDED FOR nausea   pioglitazone (ACTOS) 45 MG tablet Take 1 tablet (45 mg total) by mouth every morning.   rosuvastatin (CRESTOR) 20 MG tablet Take 1 tablet (20 mg total) by mouth at bedtime. PLEASE KEEP JANUARY VISIT FOR FURTHER REFILLS.   Semaglutide, 1 MG/DOSE, 4 MG/3ML SOPN Inject 1 mg as directed once a week.   tadalafil (CIALIS) 20 MG tablet TAKE ONE TABLET BY MOUTH every other DAY AS NEEDED    vitamin B-12 (CYANOCOBALAMIN) 1000 MCG tablet Take 1,000 mcg by mouth at bedtime.      Review of Systems:   Please see the history of present illness.    All other systems reviewed and are negative.     EKGs/Labs/Other Test Reviewed:   EKG: EKG from July 2023 demonstrates sinus rhythm with left anterior fascicular block  EKG Interpretation Date/Time:  Monday November 01 2023 08:22:20 EST Ventricular Rate:  69 PR Interval:  182 QRS Duration:  90 QT Interval:  434 QTC Calculation: 465 R Axis:   -65  Text Interpretation: Sinus rhythm with Premature supraventricular complexes Pulmonary disease pattern Left anterior fascicular block No previous ECGs available Confirmed by Alverda Skeans ([id number]) on 11/01/2023 8:27:54 AM         Risk Assessment/Calculations:          Physical Exam:   VS:  BP (!) 110/50   Pulse 67   Ht 6' (1.829 m)   Wt 186 lb 6.4 oz (  84.6 kg)   SpO2 97%   BMI 25.28 kg/m        Wt Readings from Last 3 Encounters:  11/01/23 186 lb 6.4 oz (84.6 kg)  10/14/23 186 lb (84.4 kg)  08/19/23 183 lb (83 kg)      GENERAL:  No apparent distress, AOx3 HEENT:  No carotid bruits, +2 carotid impulses, no scleral icterus CAR: RRR no murmurs, gallops, rubs, or thrills RES:  Clear to auscultation bilaterally ABD:  Soft, nontender, nondistended, positive bowel sounds x 4 VASC:  +2 radial pulses, +2 carotid pulses NEURO:  CN 2-12 grossly intact; motor and sensory grossly intact PSYCH:  No active depression or anxiety EXT:  No edema, ecchymosis, or cyanosis  Signed, Orbie Pyo, MD  11/01/2023 8:40 AM    [facility] [address] [place], [place], [place]  [address] Phone: [phone]; Fax: [phone]   Note:  This document was prepared using Dragon voice recognition software and may include unintentional dictation errors.

## 2023-11-01 ENCOUNTER — Ambulatory Visit: Payer: Medicare Other | Attending: Internal Medicine | Admitting: Internal Medicine

## 2023-11-01 ENCOUNTER — Ambulatory Visit: Payer: Medicare Other | Admitting: Internal Medicine

## 2023-11-01 ENCOUNTER — Encounter: Payer: Self-pay | Admitting: Internal Medicine

## 2023-11-01 VITALS — BP 110/50 | HR 67 | Ht 72.0 in | Wt 186.4 lb

## 2023-11-01 DIAGNOSIS — I152 Hypertension secondary to endocrine disorders: Secondary | ICD-10-CM | POA: Diagnosis not present

## 2023-11-01 DIAGNOSIS — N1832 Chronic kidney disease, stage 3b: Secondary | ICD-10-CM

## 2023-11-01 DIAGNOSIS — E785 Hyperlipidemia, unspecified: Secondary | ICD-10-CM | POA: Diagnosis not present

## 2023-11-01 DIAGNOSIS — E1159 Type 2 diabetes mellitus with other circulatory complications: Secondary | ICD-10-CM | POA: Diagnosis not present

## 2023-11-01 DIAGNOSIS — Z6827 Body mass index (BMI) 27.0-27.9, adult: Secondary | ICD-10-CM

## 2023-11-01 DIAGNOSIS — E1169 Type 2 diabetes mellitus with other specified complication: Secondary | ICD-10-CM | POA: Diagnosis not present

## 2023-11-01 DIAGNOSIS — I25118 Atherosclerotic heart disease of native coronary artery with other forms of angina pectoris: Secondary | ICD-10-CM | POA: Diagnosis not present

## 2023-11-01 DIAGNOSIS — R0602 Shortness of breath: Secondary | ICD-10-CM

## 2023-11-01 NOTE — Patient Instructions (Signed)
 Medication Instructions:  Your physician recommends that you continue on your current medications as directed. Please refer to the Current Medication list given to you today.  *If you need a refill on your cardiac medications before your next appointment, please call your pharmacy*  Lab Work: TODAY: Lipid panel, LFTs, LP(a) If you have labs (blood work) drawn today and your tests are completely normal, you will receive your results only by: MyChart Message (if you have MyChart) OR A paper copy in the mail If you have any lab test that is abnormal or we need to change your treatment, we will call you to review the results.  Follow-Up: At [facility], you and your health needs are our priority.  As part of our continuing mission to provide you with exceptional heart care, we have created designated Provider Care Teams.  These Care Teams include your primary Cardiologist (physician) and Advanced Practice Providers (APPs -  Physician Assistants and Nurse Practitioners) who all work together to provide you with the care you need, when you need it.  Your next appointment:   1 year(s)  The format for your next appointment:   In Person  Provider:   Orbie Pyo, MD {  Other Instructions   1st Floor: - Lobby - Registration  - Pharmacy  - Lab - Cafe  2nd Floor: - PV Lab - Diagnostic Testing (echo, CT, nuclear med)  3rd Floor: - Vacant  4th Floor: - TCTS (cardiothoracic surgery) - AFib Clinic - Structural Heart Clinic - Vascular Surgery  - Vascular Ultrasound  5th Floor: - HeartCare Cardiology (general and EP) - Clinical Pharmacy for coumadin, hypertension, lipid, weight-loss medications, and med management appointments    Valet parking services will be available as well.

## 2023-11-02 ENCOUNTER — Encounter: Payer: Self-pay | Admitting: Internal Medicine

## 2023-11-02 LAB — HEPATIC FUNCTION PANEL
ALT: 7 IU/L (ref 0–44)
AST: 11 IU/L (ref 0–40)
Albumin: 4 g/dL (ref 3.8–4.8)
Alkaline Phosphatase: 42 IU/L — ABNORMAL LOW (ref 44–121)
Bilirubin Total: 0.4 mg/dL (ref 0.0–1.2)
Bilirubin, Direct: 0.17 mg/dL (ref 0.00–0.40)
Total Protein: 5.9 g/dL — ABNORMAL LOW (ref 6.0–8.5)

## 2023-11-02 LAB — LIPID PANEL
Chol/HDL Ratio: 1.9 ratio (ref 0.0–5.0)
Cholesterol, Total: 127 mg/dL (ref 100–199)
HDL: 66 mg/dL (ref >39)
LDL Chol Calc (NIH): 44 mg/dL (ref 0–99)
Triglycerides: 89 mg/dL (ref 0–149)
VLDL Cholesterol Cal: 17 mg/dL (ref 5–40)

## 2023-11-02 LAB — LIPOPROTEIN A (LPA)

## 2023-11-15 ENCOUNTER — Ambulatory Visit (INDEPENDENT_AMBULATORY_CARE_PROVIDER_SITE_OTHER): Payer: Medicare Other | Admitting: Family Medicine

## 2023-11-15 ENCOUNTER — Encounter: Payer: Self-pay | Admitting: Family Medicine

## 2023-11-15 VITALS — BP 118/70 | HR 79 | Temp 97.8°F | Resp 16 | Ht 72.0 in | Wt 192.4 lb

## 2023-11-15 DIAGNOSIS — K5900 Constipation, unspecified: Secondary | ICD-10-CM | POA: Diagnosis not present

## 2023-11-15 DIAGNOSIS — Z7985 Long-term (current) use of injectable non-insulin antidiabetic drugs: Secondary | ICD-10-CM

## 2023-11-15 DIAGNOSIS — Z7984 Long term (current) use of oral hypoglycemic drugs: Secondary | ICD-10-CM | POA: Diagnosis not present

## 2023-11-15 DIAGNOSIS — N1831 Chronic kidney disease, stage 3a: Secondary | ICD-10-CM | POA: Diagnosis not present

## 2023-11-15 DIAGNOSIS — I10 Essential (primary) hypertension: Secondary | ICD-10-CM | POA: Diagnosis not present

## 2023-11-15 DIAGNOSIS — E1122 Type 2 diabetes mellitus with diabetic chronic kidney disease: Secondary | ICD-10-CM

## 2023-11-15 LAB — POCT GLYCOSYLATED HEMOGLOBIN (HGB A1C): HbA1c, POC (controlled diabetic range): 6.7 % (ref 0.0–7.0)

## 2023-11-15 LAB — URINALYSIS, ROUTINE W REFLEX MICROSCOPIC
Bilirubin Urine: NEGATIVE
Hgb urine dipstick: NEGATIVE
Ketones, ur: NEGATIVE
Leukocytes,Ua: NEGATIVE
Nitrite: NEGATIVE
RBC / HPF: NONE SEEN (ref 0–?)
Specific Gravity, Urine: 1.015 (ref 1.000–1.030)
Total Protein, Urine: 30 — AB
Urine Glucose: >=1000 — AB
Urobilinogen, UA: 1 (ref 0.0–1.0)
pH: 6 (ref 5.0–8.0)

## 2023-11-15 NOTE — Progress Notes (Signed)
 HPI: [name] is a 78 y.o. male with a PMHx significant for HTN, venous insufficiency, GERD, DM II, degenerative joint disease, CKD III, anxiety, HLD, and normocytic anemia, among some, who is here today for chronic disease management.  Last seen on 08/16/2023  Diabetes Mellitus II: Dx'ed 10+ years ago. - Checking BG at home: He checks his BG regularly at home and says it stays in the low 100s. Denies any low blood sugars.  - Medications: Currently on Jardiance 25 mg daily, Glipizide 5 mg daily, Metformin 1000 mg twice daily, Actos 45 mg daily, and Ozempic 1 mg weekly.  - Diet: He has seen a nutritionist since his last visit. He says he has switched from drinking soft drinks to drinking 1-2 glasses of apple juice most days. He says this has helped with him having more consistent bowel movements, but has also made him urinate more often.  - Exercise: He has not been exercise but is planning to now that the weather is improving.  - eye exam: UTD on routine vision care. His last appointment was in 08/2023. He needs a new provider because his old one retired.  - foot exam: 03/2023 - Negative for symptoms of hypoglycemia, polyuria, polydipsia, foot ulcers/trauma  Lab Results  Component Value Date   HGBA1C 7.5 (A) 08/16/2023   Lab Results  Component Value Date   MICROALBUR 7.2 (H) 08/16/2023   Hypertension:  Medications: Currently on lisinopril 10 mg daily. He saw his cardiologist on 3/3.  Checks his BP regularly at home and says it is normally 120/70.  CKD III: Negative for gross hematuria, decreased urine output, or foam in urine.  Lab Results  Component Value Date   NA 143 08/16/2023   CL 107 08/16/2023   K 4.4 08/16/2023   CO2 25 08/16/2023   BUN 25 (H) 08/16/2023   CREATININE 1.53 (H) 08/16/2023   GFR 44.17 (L) 08/16/2023   CALCIUM 9.7 08/16/2023   ALBUMIN 4.0 11/01/2023   GLUCOSE 149 (H) 08/16/2023   Hyperlipidemia: Currently on rosuvastatin 20 mg daily.  Lab  Results  Component Value Date   CHOL 127 11/01/2023   HDL 66 11/01/2023   LDLCALC 44 11/01/2023   TRIG 89 11/01/2023   CHOLHDL 1.9 11/01/2023   Concerns today:   Patient complains of rhinorrhea while eating. He has an appointment with ENT scheduled.  Hx of vertigo, has not had symptoms since his last visit.  Review of Systems  Constitutional:  Negative for activity change, appetite change and fever.  HENT:  Negative for mouth sores and sore throat.   Respiratory:  Negative for cough and wheezing.   Gastrointestinal:  Negative for abdominal pain, nausea and vomiting.  Endocrine: Negative for cold intolerance and heat intolerance.  Genitourinary:  Negative for dysuria.  Skin:  Negative for rash.  Neurological:  Negative for syncope and facial asymmetry.  Psychiatric/Behavioral:  Negative for confusion and hallucinations.   See other pertinent positives and negatives in HPI.  Current Outpatient Medications on File Prior to Visit  Medication Sig Dispense Refill   aspirin 81 MG tablet Take 1 tablet by mouth once a week.     azelastine (ASTELIN) 0.1 % nasal spray Place 2 sprays into both nostrils 2 (two) times daily. Use in each nostril as directed 30 mL 12   empagliflozin (JARDIANCE) 25 MG TABS tablet Take 1 tablet (25 mg total) by mouth daily. 90 tablet 1   glipiZIDE (GLUCOTROL) 5 MG tablet Take 1 tablet (5  mg total) by mouth daily before breakfast. 90 tablet 1   glucose blood (ONETOUCH ULTRA) test strip USE TO check blood glucose twice daily AS DIRECTED 180 strip 3   ipratropium (ATROVENT) 0.06 % nasal spray Place 2 sprays each nostril up to 4 times a day for symptoms relief 15 mL 5   Lancets (ONETOUCH DELICA PLUS LANCET33G) MISC USE TO test blood sugar TWICE DAILY 420 each 2   levocetirizine (XYZAL) 5 MG tablet TAKE 1 TABLET BY MOUTH EACH EVENING 30 tablet 10   lisinopril (ZESTRIL) 10 MG tablet Take 1 tablet (10 mg total) by mouth every evening. 90 tablet 1   meclizine (ANTIVERT) 25  MG tablet Take 1 tablet (25 mg total) by mouth at bedtime as needed for dizziness. 20 tablet 0   metFORMIN (GLUCOPHAGE) 1000 MG tablet Take 1 tablet (1,000 mg total) by mouth 2 (two) times daily with a meal. 180 tablet 1   nitroGLYCERIN (NITROSTAT) 0.4 MG SL tablet Take one tablet sublingual as needed for chest pain up to three doses as needed. 20 tablet 0   ondansetron (ZOFRAN-ODT) 8 MG disintegrating tablet PLACE ONE TABLET ON TONGUE AND ALLOW TO DISSOLVE EVERY FOUR HOURS AS NEEDED FOR nausea 12 tablet 0   pioglitazone (ACTOS) 45 MG tablet Take 1 tablet (45 mg total) by mouth every morning. 90 tablet 1   rosuvastatin (CRESTOR) 20 MG tablet Take 1 tablet (20 mg total) by mouth at bedtime. PLEASE KEEP JANUARY VISIT FOR FURTHER REFILLS. 90 tablet 0   Semaglutide, 1 MG/DOSE, 4 MG/3ML SOPN Inject 1 mg as directed once a week. 3 mL 2   tadalafil (CIALIS) 20 MG tablet TAKE ONE TABLET BY MOUTH every other DAY AS NEEDED 30 tablet 3   vitamin B-12 (CYANOCOBALAMIN) 1000 MCG tablet Take 1,000 mcg by mouth at bedtime.      No current facility-administered medications on file prior to visit.    Past Medical History:  Diagnosis Date   Acute URI 10/10/2012   Allergy    seasonal   Anxiety    Cataract    Chest pain, atypical    Chronic kidney disease    Colon polyps    hyperplastic 2003   DJD (degenerative joint disease)    DM (diabetes mellitus) (HCC)    GERD (gastroesophageal reflux disease)    History of headache    Hypercholesteremia    Psoriasis    Vertigo    Allergies  Allergen Reactions   Lisinopril Cough   Erythromycin     REACTION: abd pain   Trulicity [Dulaglutide] Nausea Only    Social History   Socioeconomic History   Marital status: Divorced    Spouse name: carolyn   Number of children: Not on file   Years of education: Not on file   Highest education level: GED or equivalent  Occupational History    Comment: Curator  Tobacco Use   Smoking status: Former     Current packs/day: 0.00    Average packs/day: 2.0 packs/day for 20.0 years (40.0 ttl pk-yrs)    Types: Cigarettes    Start date: 08/31/1961    Quit date: 08/31/1981    Years since quitting: 42.2   Smokeless tobacco: Never  Vaping Use   Vaping status: Never Used  Substance and Sexual Activity   Alcohol use: Yes    Comment: rarely a beer   Drug use: Never   Sexual activity: Not Currently  Other Topics Concern   Not on  file  Social History Narrative   Not on file   Social Drivers of Health   Financial Resource Strain: Low Risk  (05/05/2023)   Overall Financial Resource Strain (CARDIA)    Difficulty of Paying Living Expenses: Not hard at all  Food Insecurity: No Food Insecurity (05/05/2023)   Hunger Vital Sign    Worried About Running Out of Food in the Last Year: Never true    Ran Out of Food in the Last Year: Never true  Transportation Needs: No Transportation Needs (05/05/2023)   PRAPARE - Administrator, Civil Service (Medical): No    Lack of Transportation (Non-Medical): No  Physical Activity: Sufficiently Active (05/05/2023)   Exercise Vital Sign    Days of Exercise per Week: 5 days    Minutes of Exercise per Session: 30 min  Stress: No Stress Concern Present (05/05/2023)   [employer] of Occupational Health - Occupational Stress Questionnaire    Feeling of Stress : Not at all  Social Connections: Moderately Integrated (05/05/2023)   Social Connection and Isolation Panel [NHANES]    Frequency of Communication with Friends and Family: More than three times a week    Frequency of Social Gatherings with Friends and Family: More than three times a week    Attends Religious Services: More than 4 times per year    Active Member of [employer] or Organizations: Yes    Attends Banker Meetings: More than 4 times per year    Marital Status: Divorced    Vitals:   11/15/23 0945  BP: 118/70  Pulse: 79  Resp: 16  Temp: 97.8 F (36.6 C)  SpO2: 97%   Body mass  index is 26.09 kg/m.  Physical Exam Vitals and nursing note reviewed.  Constitutional:      General: He is not in acute distress.    Appearance: He is well-developed.  HENT:     Head: Normocephalic and atraumatic.     Mouth/Throat:     Mouth: Mucous membranes are moist.     Pharynx: Oropharynx is clear. Uvula midline.  Eyes:     Conjunctiva/sclera: Conjunctivae normal.  Cardiovascular:     Rate and Rhythm: Normal rate and regular rhythm.     Pulses:          Dorsalis pedis pulses are 2+ on the right side and 2+ on the left side.     Heart sounds: No murmur heard.    Comments: Trace bilateral pitting edema Pulmonary:     Effort: Pulmonary effort is normal. No respiratory distress.     Breath sounds: Normal breath sounds.  Abdominal:     Palpations: Abdomen is soft. There is no hepatomegaly or mass.     Tenderness: There is no abdominal tenderness.  Musculoskeletal:     Right lower leg: Pitting Edema present.     Left lower leg: Pitting Edema present.  Skin:    General: Skin is warm.     Findings: No erythema or rash.  Neurological:     Mental Status: He is alert and oriented to person, place, and time.     Cranial Nerves: No cranial nerve deficit.     Gait: Gait normal.  Psychiatric:        Mood and Affect: Mood and affect normal.   ASSESSMENT AND PLAN:  Mr. Prewitt was seen today for chronic disease management.   Orders Placed This Encounter  Procedures   Urinalysis, Routine w reflex microscopic   POC HgB  A1c   Lab Results  Component Value Date   HGBA1C 6.7 11/15/2023   Type 2 diabetes mellitus with stage 3a chronic kidney disease, without long-term current use of insulin (HCC) Assessment & Plan: HgA1C has improved and now at goal. Continue Jardiance 25 mg daily, Glipizide 5 mg daily, Metformin 1000 mg twice daily, Actos 45 mg daily, and Ozempic 1 mg weekly.  Next visit if hemoglobin A1c continues improving, we could consider decreasing Actos and/or stopping  glipizide. Regular exercise and healthy diet with avoidance of added sugar food intake is an important part of treatment and recommended. Annual eye exam, periodic dental and foot care recommended. F/U in 5-6 months.  Orders: -     POCT glycosylated hemoglobin (Hb A1C)  Constipation, unspecified constipation type Assessment & Plan: Instead drinking juice, recommend increasing water intake and adding OTC Benefiber 1 teaspoon twice daily. MiraLAX every other day recommended for constipation.   Stage 3a chronic kidney disease (HCC) Assessment & Plan: Problem has been stable, creatinine.6 and e GFR mid 40's low 50's. Encouraged to increase water intake. Continue low-salt diet and avoidance of NSAIDs. Currently on Jardiance and lisinopril.  Orders: -     Urinalysis, Routine w reflex microscopic; Future  Primary hypertension Assessment & Plan: Reporting BPs at home 120s/70s. Continue lisinopril 10 mg daily and low-salt diet. Continue monitoring BP at home.   Return in about 6 months (around 05/17/2024) for chronic problems.  I, Rolla Etienne Wierda, acting as a scribe for Brittanni Cariker [place], MD., have documented all relevant documentation on the behalf of Lucas Springfield [place], MD, as directed by  Bodin Gorka [place], MD while in the presence of Dawana Asper [place], MD.   I, Vannessa Godown [place], MD, have reviewed all documentation for this visit. The documentation on 11/15/23 for the exam, diagnosis, procedures, and orders are all accurate and complete.  Herrick Hartog G. [place], MD  [facility]. [place] office.

## 2023-11-15 NOTE — Assessment & Plan Note (Signed)
 HgA1C has improved and now at goal. Continue Jardiance 25 mg daily, Glipizide 5 mg daily, Metformin 1000 mg twice daily, Actos 45 mg daily, and Ozempic 1 mg weekly.  Next visit if hemoglobin A1c continues improving, we could consider decreasing Actos and/or stopping glipizide. Regular exercise and healthy diet with avoidance of added sugar food intake is an important part of treatment and recommended. Annual eye exam, periodic dental and foot care recommended. F/U in 5-6 months.

## 2023-11-15 NOTE — Assessment & Plan Note (Signed)
 Instead drinking juice, recommend increasing water intake and adding OTC Benefiber 1 teaspoon twice daily. MiraLAX every other day recommended for constipation.

## 2023-11-15 NOTE — Patient Instructions (Addendum)
 A few things to remember from today's visit:  Type 2 diabetes mellitus with stage 3a chronic kidney disease, without long-term current use of insulin (HCC) - Plan: POC HgB A1c  Stage 3a chronic kidney disease (HCC) - Plan: Urinalysis, Routine w reflex microscopic  Primary hypertension No changes today. Try to stop juice intake and drink water instead.  Miralax every other day for constipation.  If you need refills for medications you take chronically, please call your pharmacy. Do not use My Chart to request refills or for acute issues that need immediate attention. If you send a my chart message, it may take a few days to be addressed, specially if I am not in the office.  Please be sure medication list is accurate. If a new problem present, please set up appointment sooner than planned today.

## 2023-11-15 NOTE — Assessment & Plan Note (Signed)
 Reporting BPs at home 120s/70s. Continue lisinopril 10 mg daily and low-salt diet. Continue monitoring BP at home.

## 2023-11-15 NOTE — Assessment & Plan Note (Signed)
 Problem has been stable, creatinine.6 and e GFR mid 40's low 50's. Encouraged to increase water intake. Continue low-salt diet and avoidance of NSAIDs. Currently on Jardiance and lisinopril.

## 2023-11-18 ENCOUNTER — Telehealth (INDEPENDENT_AMBULATORY_CARE_PROVIDER_SITE_OTHER): Payer: Self-pay | Admitting: Otolaryngology

## 2023-11-18 NOTE — Telephone Encounter (Signed)
 Confirmed appt & location [id number] [id number]

## 2023-11-19 ENCOUNTER — Encounter (INDEPENDENT_AMBULATORY_CARE_PROVIDER_SITE_OTHER): Payer: Self-pay

## 2023-11-19 ENCOUNTER — Ambulatory Visit (INDEPENDENT_AMBULATORY_CARE_PROVIDER_SITE_OTHER): Payer: Medicare Other | Admitting: Otolaryngology

## 2023-11-19 VITALS — BP 119/65 | HR 75 | Ht 72.0 in | Wt 185.0 lb

## 2023-11-19 DIAGNOSIS — J300 Vasomotor rhinitis: Secondary | ICD-10-CM | POA: Diagnosis not present

## 2023-11-19 DIAGNOSIS — R0982 Postnasal drip: Secondary | ICD-10-CM | POA: Diagnosis not present

## 2023-11-19 DIAGNOSIS — R490 Dysphonia: Secondary | ICD-10-CM | POA: Diagnosis not present

## 2023-11-19 NOTE — Progress Notes (Signed)
 Dear Dr. Swaziland, Here is my assessment for our mutual patient, Lucas Warren. Thank you for allowing me the opportunity to care for your patient. Please do not hesitate to contact me should you have any other questions. Sincerely, Dr. Jovita Kussmaul  Otolaryngology Clinic Note Referring provider: Dr. Swaziland HPI:  Lucas Warren is a 78 y.o. male kindly referred by Dr. Swaziland for evaluation of sinonasal issues.  Initial visit (08/2023): He reports that he primarily has a nasal dripping problem. He reports that he has bilateral mucoid rhinorrhea which is persistent, significantly worsening with eating (any kind of food, maybe worse with spicy food but has not specifically noticed). He reports that it has been doing this for several years. He saw Dr. Jearld Fenton in 2020, but did not help with nasacort or allegra. He reports that it is embarrassing. He has tried Xyzal, Astelin, Nasacort, Flonase, Nasal saline spray, and atrovent 0.06%. He has not tried everything at once. Nothing really works.  No headaches, not induced by head tilt or valsalva  Had vertigo but recovered. No hearing loss or fullness, or drainage. Suspected BPPV, did vestibular Rx and now resolved.  He also reports some hoarseness which has been ongoing for a few weeks. Did have URI few weeks ago. Denies obvious GERD Sx. Worse at end of day. No cough Patient otherwise denies: - dysphagia, odynophagia, aspiration episodes or PNA, need for Heimlich, unintentional weight loss - changes in voice, shortness of breath, hemoptysis - ear pain, neck masses  He does not report frequent sinus infections or CRS symptoms including pain/pressure, discolored drainage, congestion, discolored drainage.  He has had prior allergy testing and CT. --------------------------------------------------------- 10/14/2023 Doing better on sprays initially, esp with atrovent. Dripping was much better, but then had some blood tinged drainage so stopped. Dripping  re-started afterwards. This continues to bother him significantly. Voice improved  --------------------------------------------------------- 11/19/2023 Doing better on sprays but still very bothersome. We discussed options and he is quite frustrated and he does wish to proceed with Clarifix. ------------------------------------------------------------  PMHx: T2DM, GERD, CKD, HLD  H&N Surgery: Sinus surgery for deviated septum (done several years ago in [place]) Personal or FHx of bleeding dz or anesthesia difficulty: no  AP/AC: ASA 81 once per week GLP-1: on ozempic for DM  Tobacco: no. Lives in [place], [place]  Independent Review of Additional Tests or Records:  Dr. Caryl Never (06/10/2022): vertigo - woke up with spinning, some nausea and 1 episode of vomiting; no headaches, hearing change, URI sx, no other sx. Meclizine helped. Suspected BPPV, noted horizontal nystagmus; Discussed epley, Meclizine PRN ED Notes Dr. Judd Lien (06/22/2023): Vertigo for 3 weeks, mostly when waking up and changing position; no other sx; CT done, Rx meclizine Vestibular rehab 07/13/2023: VOR noted normal, Left HIT covert positive; R right hallpike few beats R upbeating; Dx likely L posterior BPPV; now Asx, discharged Dr. Swaziland 08/16/2023: vertigo hsa resolved. Using epley; now just complains of rhinorrhea in mornings or after he eats; no congestion just PND; tried zyrtec, flonase, atrovent, irrigations, nthing helped. Reports negative allergy test. Two prior nasal surgery [facility] 06/22/2023: reviewed independently, prior b/l max and loks like at least anterior ethmoidectomy; no significant opacification; mastoids and ME well aerated; given cut thickness can't assess superior SCC well but no noted otic capsule abnormality Dr. Jearld Fenton (06/2019): post nasal drip for a while, tried OTC meds and when he eats. Rx: allegra, nasacort, suspected vasomotor rhinitis. Dr. Delorse Lek (Allergy 05/06/2022) - for rhinitis, tried atrovent 0.06%  but using twice per; tested  for allergies, did not work significantly. Allergy testing 05/29/2022: positive for grass pollen, tree pollen, dust mite  PMH/Meds/All/SocHx/FamHx/ROS:   Past Medical History:  Diagnosis Date   Acute URI 10/10/2012   Allergy    seasonal   Anxiety    Cataract    Chest pain, atypical    Chronic kidney disease    Colon polyps    hyperplastic 2003   DJD (degenerative joint disease)    DM (diabetes mellitus) (HCC)    GERD (gastroesophageal reflux disease)    History of headache    Hypercholesteremia    Psoriasis    Vertigo      Past Surgical History:  Procedure Laterality Date   CATARACT EXTRACTION, BILATERAL Bilateral    Dr. Darel Hong;    COLONOSCOPY     INGUINAL HERNIA REPAIR  1989   LEFT HEART CATH AND CORONARY ANGIOGRAPHY N/A 05/20/2020   Procedure: LEFT HEART CATH AND CORONARY ANGIOGRAPHY;  Surgeon: Corky Crafts, MD;  Location: [facility];  Service: Cardiovascular;  Laterality: N/A;   NASAL SEPTUM SURGERY  1989   SINOSCOPY     TONSILLECTOMY      Family History  Problem Relation Age of Onset   Lung cancer Mother    Alzheimer's disease Father    Heart disease Father    Leukemia Sister    Allergic rhinitis Neg Hx    Asthma Neg Hx    Eczema Neg Hx    Urticaria Neg Hx    Colon cancer Neg Hx    Colon polyps Neg Hx    Esophageal cancer Neg Hx    Rectal cancer Neg Hx    Stomach cancer Neg Hx      Social Connections: Moderately Integrated (05/05/2023)   Social Connection and Isolation Panel [NHANES]    Frequency of Communication with Friends and Family: More than three times a week    Frequency of Social Gatherings with Friends and Family: More than three times a week    Attends Religious Services: More than 4 times per year    Active Member of [employer] or Organizations: Yes    Attends Engineer, structural: More than 4 times per year    Marital Status: Divorced      Current Outpatient Medications:    aspirin 81 MG  tablet, Take 1 tablet by mouth once a week., Disp: , Rfl:    azelastine (ASTELIN) 0.1 % nasal spray, Place 2 sprays into both nostrils 2 (two) times daily. Use in each nostril as directed, Disp: 30 mL, Rfl: 12   empagliflozin (JARDIANCE) 25 MG TABS tablet, Take 1 tablet (25 mg total) by mouth daily., Disp: 90 tablet, Rfl: 1   glipiZIDE (GLUCOTROL) 5 MG tablet, Take 1 tablet (5 mg total) by mouth daily before breakfast., Disp: 90 tablet, Rfl: 1   glucose blood (ONETOUCH ULTRA) test strip, USE TO check blood glucose twice daily AS DIRECTED, Disp: 180 strip, Rfl: 3   ipratropium (ATROVENT) 0.06 % nasal spray, Place 2 sprays each nostril up to 4 times a day for symptoms relief, Disp: 15 mL, Rfl: 5   Lancets (ONETOUCH DELICA PLUS LANCET33G) MISC, USE TO test blood sugar TWICE DAILY, Disp: 420 each, Rfl: 2   levocetirizine (XYZAL) 5 MG tablet, TAKE 1 TABLET BY MOUTH EACH EVENING, Disp: 30 tablet, Rfl: 10   lisinopril (ZESTRIL) 10 MG tablet, Take 1 tablet (10 mg total) by mouth every evening., Disp: 90 tablet, Rfl: 1   meclizine (ANTIVERT) 25 MG  tablet, Take 1 tablet (25 mg total) by mouth at bedtime as needed for dizziness., Disp: 20 tablet, Rfl: 0   metFORMIN (GLUCOPHAGE) 1000 MG tablet, Take 1 tablet (1,000 mg total) by mouth 2 (two) times daily with a meal., Disp: 180 tablet, Rfl: 1   nitroGLYCERIN (NITROSTAT) 0.4 MG SL tablet, Take one tablet sublingual as needed for chest pain up to three doses as needed., Disp: 20 tablet, Rfl: 0   ondansetron (ZOFRAN-ODT) 8 MG disintegrating tablet, PLACE ONE TABLET ON TONGUE AND ALLOW TO DISSOLVE EVERY FOUR HOURS AS NEEDED FOR nausea, Disp: 12 tablet, Rfl: 0   pioglitazone (ACTOS) 45 MG tablet, Take 1 tablet (45 mg total) by mouth every morning., Disp: 90 tablet, Rfl: 1   rosuvastatin (CRESTOR) 20 MG tablet, Take 1 tablet (20 mg total) by mouth at bedtime. PLEASE KEEP JANUARY VISIT FOR FURTHER REFILLS., Disp: 90 tablet, Rfl: 0   Semaglutide, 1 MG/DOSE, 4 MG/3ML SOPN,  Inject 1 mg as directed once a week., Disp: 3 mL, Rfl: 2   tadalafil (CIALIS) 20 MG tablet, TAKE ONE TABLET BY MOUTH every other DAY AS NEEDED, Disp: 30 tablet, Rfl: 3   vitamin B-12 (CYANOCOBALAMIN) 1000 MCG tablet, Take 1,000 mcg by mouth at bedtime. , Disp: , Rfl:    Physical Exam:   BP 119/65 (BP Location: Left Arm, Patient Position: Sitting, Cuff Size: Large)   Pulse 75   Ht 6' (1.829 m)   Wt 185 lb (83.9 kg)   SpO2 96%   BMI 25.09 kg/m   Salient findings:  CN II-XII intact  Bilateral EAC clear and TM intact with well pneumatized middle ear spaces Anterior rhinoscopy: Septum midline; bilateral inferior turbinates with postsurgical changes and reduced;  Head tilt negative No lesions of oral cavity/oropharynx No obviously palpable neck masses/lymphadenopathy/thyromegaly No respiratory distress or stridor; voice quality class 2   Seprately Identifiable Procedures:  PROCEDURE: Bilateral Diagnostic Rigid Nasal Endoscopy (Prior not today) Description of Procedure:  Patient was identified. A rigid 30 degree endoscope was utilized to evaluate the sinonasal cavities, mucosa, sinus ostia and turbinates and septum.  Overall, signs of mucosal inflammation are not noted.  Also noted are post-surgical changes - bilateral turbinates resected partially; max and ethmoids patent b/l no crusting; right partial middle turbinectomy.  No mucopurulence, polyps, or masses noted.  No evidence of active CSF leak on valsalva Right Middle meatus: clear Right SE Recess: clear Left MM: clear Left SE Recess: clear Photodocumentation was obtained.  CPT CODE -- 31231 - Mod 25  Procedure Note (prior not today) Pre-procedure diagnosis:  Dysphonia Post-procedure diagnosis: Same Procedure: Transnasal Fiberoptic Laryngoscopy, CPT 31575 - Mod 25 Indication: dysphonia Complications: None apparent EBL: 0 mL  The procedure was undertaken to further evaluate the patient's complaint of dysphonia, with mirror exam  inadequate for appropriate examination due to gag reflex and poor patient tolerance  Procedure:  Patient was identified as correct patient. Verbal consent was obtained. The nose was sprayed with oxymetazoline and 4% lidocaine. The The flexible laryngoscope was passed through the nose to view the nasal cavity, pharynx (oropharynx, hypopharynx) and larynx.  The larynx was examined at rest and during multiple phonatory tasks. Documentation was obtained and reviewed with patient. The scope was removed. The patient tolerated the procedure well.  Findings: The nasal cavity and nasopharynx did not reveal any masses or lesions, mucosa appeared to be without obvious lesions. The tongue base, pharyngeal walls, piriform sinuses, vallecula, epiglottis and postcricoid region are normal in appearance. The  visualized portion of the subglottis and proximal trachea is widely patent. The vocal folds are mobile bilaterally. There are no lesions on the free edge of the vocal folds nor elsewhere in the larynx worrisome for malignancy. No significant retained secretions; R > L VF age appropriate atrophy  Electronically signed by: Read Drivers, MD 11/19/2023 1:16 PM   Impression & Plans:  Lucas Warren is a 78 y.o. male with:  1. Vasomotor rhinitis   2. Post-nasal drip   3. Dysphonia    Primary complaint is really rhinitis with eating, though it is occurring at all the time. Based on symptoms and exam, do not think this is CSF as he has no other symptoms. Ddx most likely includes vasomotor rhinitis v/s NARES or other rhinitis. He did improve on sprays.  As such, we discussed continued sprays v/s clarifix.   - He is quite frustrated and wishes to try posterior nasal nerve ablation. We will schedule bilaterally - Continue flonase BID - Continue Astelin BID - Continue atrovent 0.06% BID - can increase to up to 4 times per day if he wishes - do not think CSF given improv on atrovent and rhinorrhea is mucoid/assoc  with eating - Will need to stop Ozempic 1 week prior to surgery and stop ASA 81 1 week prior to surgery.  Dysphonia: query LPR with contribution from atrophy; - improved, will observe  - f/u 2 weeks post op.   See below regarding exact medications prescribed this encounter including dosages and route: No orders of the defined types were placed in this encounter.     Thank you for allowing me the opportunity to care for your patient. Please do not hesitate to contact me should you have any other questions.  Sincerely, Jovita Kussmaul, MD Otolaryngologist (ENT), [facility] Phone: [phone] Fax: [phone]  11/19/2023, 1:16 PM   MDM:  Level 4 - 99214 Complexity/Problems addressed: mod - multiple chronic problems Data complexity: low - Morbidity: mod  - decision for surgery - Prescription Drug prescribed or managed: no

## 2023-12-09 ENCOUNTER — Other Ambulatory Visit: Payer: Self-pay | Admitting: Family Medicine

## 2023-12-09 NOTE — Telephone Encounter (Signed)
 Copied from CRM [id number]. Topic: Clinical - Prescription Issue >> Dec 09, 2023  1:27 PM Efraim Kaufmann C wrote: Reason for CRM: Dominique from [facility] faxed over request for glipiZIDE (GLUCOTROL) 5 MG tablet and has not heard anything back yet. Please advise at [phone]

## 2023-12-13 ENCOUNTER — Other Ambulatory Visit: Payer: Self-pay | Admitting: Family Medicine

## 2023-12-13 MED ORDER — GLIPIZIDE 5 MG PO TABS: 5.0000 mg | ORAL_TABLET | Freq: Every day | ORAL | 1 refills | Status: DC

## 2023-12-13 NOTE — Telephone Encounter (Signed)
 Copied from CRM [id number]. Topic: Clinical - Medication Refill >> Dec 13, 2023  3:37 PM Orien Bird wrote: Most Recent Primary Care Visit:  Provider: Sw[place]il[place], BETTY G  Department: [facility]  Visit Type: OFFICE VISIT  Date: 11/15/2023  Medication: metFORMIN (GLUCOPHAGE) 1000 MG tablet  Has the patient contacted their pharmacy? Yes (Agent: If no, request that the patient contact the pharmacy for the refill. If patient does not wish to contact the pharmacy document the reason why and proceed with request.) (Agent: If yes, when and what did the pharmacy advise?)  Is this the correct pharmacy for this prescription? Yes If no, delete pharmacy and type the correct one.  This is the patient's preferred pharmacy:  [facility] - [place], [place] - [address] [place] [address] Phone: [phone] Fax: [phone]  [facility] [place]  - [place], [place] - [address] [place] [address] Phone: [phone] Fax: [phone]  [facility] - [address], [place] - [address] [place] [address] Phone: [phone] Fax: [phone]   Has the prescription been filled recently? No  Is the patient out of the medication? Yes  Has the patient been seen for an appointment in the last year OR does the patient have an upcoming appointment? Yes  Can we respond through MyChart? No  Agent: Please be advised that Rx refills may take up to 3 business days. We ask that you follow-up with your pharmacy.

## 2023-12-14 ENCOUNTER — Other Ambulatory Visit: Payer: Self-pay | Admitting: Family Medicine

## 2023-12-14 NOTE — Telephone Encounter (Signed)
 Copied from CRM [id number]. Topic: Clinical - Medication Refill >> Dec 14, 2023  3:12 PM Earnestine Goes B wrote: Most Recent Primary Care Visit:  Provider: [place], BETTY G  Department: [facility]  Visit Type: OFFICE VISIT  Date: 11/15/2023  Medication: metFORMIN (GLUCOPHAGE) 1000 MG tablet  Has the patient contacted their pharmacy? Yes (Agent: If no, request that the patient contact the pharmacy for the refill. If patient does not wish to contact the pharmacy document the reason why and proceed with request.) (Agent: If yes, when and what did the pharmacy advise?)  Is this the correct pharmacy for this prescription? Yes If no, delete pharmacy and type the correct one.  This is the patient's preferred pharmacy:  [facility] - [place], [place] - [address] [place] [address] Phone: [phone] Fax: [phone]     Has the prescription been filled recently? Yes  Is the patient out of the medication? Yes  Has the patient been seen for an appointment in the last year OR does the patient have an upcoming appointment? Yes  Can we respond through MyChart? Yes  Agent: Please be advised that Rx refills may take up to 3 business days. We ask that you follow-up with your pharmacy.

## 2023-12-15 ENCOUNTER — Other Ambulatory Visit (HOSPITAL_BASED_OUTPATIENT_CLINIC_OR_DEPARTMENT_OTHER): Payer: Self-pay | Admitting: Physician Assistant

## 2023-12-15 MED ORDER — METFORMIN HCL 1000 MG PO TABS: 1000.0000 mg | ORAL_TABLET | Freq: Two times a day (BID) | ORAL | 1 refills | Status: DC

## 2023-12-28 ENCOUNTER — Telehealth: Payer: Self-pay

## 2023-12-28 NOTE — Telephone Encounter (Signed)
 Patient's PAP came in, Ozempic  placed in fridge in [place] pod. Patient is aware it is ready for pick up.

## 2024-01-07 ENCOUNTER — Other Ambulatory Visit: Payer: Self-pay | Admitting: Family Medicine

## 2024-01-10 ENCOUNTER — Telehealth (INDEPENDENT_AMBULATORY_CARE_PROVIDER_SITE_OTHER): Payer: Self-pay | Admitting: Otolaryngology

## 2024-01-10 DIAGNOSIS — R0981 Nasal congestion: Secondary | ICD-10-CM | POA: Diagnosis not present

## 2024-01-10 DIAGNOSIS — J3489 Other specified disorders of nose and nasal sinuses: Secondary | ICD-10-CM | POA: Diagnosis not present

## 2024-01-10 DIAGNOSIS — J310 Chronic rhinitis: Secondary | ICD-10-CM | POA: Diagnosis not present

## 2024-01-10 MED ORDER — ACETAMINOPHEN 500 MG PO TABS: 1000.0000 mg | ORAL_TABLET | Freq: Four times a day (QID) | ORAL | 0 refills | Status: AC | PRN

## 2024-01-10 MED ORDER — IBUPROFEN 200 MG PO TABS: 400.0000 mg | ORAL_TABLET | Freq: Four times a day (QID) | ORAL | 0 refills | Status: AC | PRN

## 2024-01-10 NOTE — Telephone Encounter (Signed)
 Bilateral clarifix performed 01/10/2024 at [facility]. Will hold ASA 1 week Tylenol  and ibuprofen for pain Resume sprays Will request follow up in 4 weeks

## 2024-01-14 ENCOUNTER — Telehealth (INDEPENDENT_AMBULATORY_CARE_PROVIDER_SITE_OTHER): Payer: Self-pay | Admitting: Otolaryngology

## 2024-01-14 NOTE — Telephone Encounter (Signed)
 LVM to reschedule post-op appt per Dr. Lydia Sams (around 06/13)

## 2024-01-27 ENCOUNTER — Ambulatory Visit (INDEPENDENT_AMBULATORY_CARE_PROVIDER_SITE_OTHER): Admitting: Otolaryngology

## 2024-02-03 ENCOUNTER — Other Ambulatory Visit: Payer: Self-pay | Admitting: Family Medicine

## 2024-02-03 DIAGNOSIS — E1122 Type 2 diabetes mellitus with diabetic chronic kidney disease: Secondary | ICD-10-CM

## 2024-02-14 ENCOUNTER — Ambulatory Visit (INDEPENDENT_AMBULATORY_CARE_PROVIDER_SITE_OTHER): Admitting: Otolaryngology

## 2024-02-14 ENCOUNTER — Encounter (INDEPENDENT_AMBULATORY_CARE_PROVIDER_SITE_OTHER): Payer: Self-pay | Admitting: Otolaryngology

## 2024-02-14 VITALS — BP 150/70 | HR 65

## 2024-02-14 DIAGNOSIS — J300 Vasomotor rhinitis: Secondary | ICD-10-CM | POA: Diagnosis not present

## 2024-02-14 DIAGNOSIS — R0982 Postnasal drip: Secondary | ICD-10-CM

## 2024-02-14 DIAGNOSIS — R490 Dysphonia: Secondary | ICD-10-CM

## 2024-02-14 DIAGNOSIS — Z9889 Other specified postprocedural states: Secondary | ICD-10-CM | POA: Diagnosis not present

## 2024-02-14 DIAGNOSIS — J383 Other diseases of vocal cords: Secondary | ICD-10-CM | POA: Diagnosis not present

## 2024-02-14 NOTE — Progress Notes (Signed)
 Dear Dr. Swaziland, Here is my assessment for our mutual patient, Lucas Warren. Thank you for allowing me the opportunity to care for your patient. Please do not hesitate to contact me should you have any other questions. Sincerely, Dr. Eldora Blanch  Otolaryngology Clinic Note Referring provider: Dr. Swaziland HPI:  Lucas Warren is a 78 y.o. male kindly referred by Dr. Swaziland for evaluation of sinonasal issues.  Initial visit (08/2023): He reports that he primarily has a nasal dripping problem. He reports that he has bilateral mucoid rhinorrhea which is persistent, significantly worsening with eating (any kind of food, maybe worse with spicy food but has not specifically noticed). He reports that it has been doing this for several years. He saw Dr. Roark in 2020, but did not help with nasacort or allegra. He reports that it is embarrassing. He has tried Xyzal , Astelin , Nasacort, Flonase , Nasal saline spray, and atrovent  0.06%. He has not tried everything at once. Nothing really works.  No headaches, not induced by head tilt or valsalva  Had vertigo but recovered. No hearing loss or fullness, or drainage. Suspected BPPV, did vestibular Rx and now resolved.  He also reports some hoarseness which has been ongoing for a few weeks. Did have URI few weeks ago. Denies obvious GERD Sx. Worse at end of day. No cough Patient otherwise denies: - dysphagia, odynophagia, aspiration episodes or PNA, need for Heimlich, unintentional weight loss - changes in voice, shortness of breath, hemoptysis - ear pain, neck masses  He does not report frequent sinus infections or CRS symptoms including pain/pressure, discolored drainage, congestion, discolored drainage.  He has had prior allergy  testing and CT. --------------------------------------------------------- 10/14/2023 Doing better on sprays initially, esp with atrovent . Dripping was much better, but then had some blood tinged drainage so stopped. Dripping  re-started afterwards. This continues to bother him significantly. Voice improved  --------------------------------------------------------- 11/19/2023 Doing better on sprays but still very bothersome. We discussed options and he is quite frustrated and he does wish to proceed with Clarifix. --------------------------------------------------------- 02/14/2024 Doing significantly better after Clarifix. He has stopped using sprays, and despite that, he reports at least 50% improvement on his dripping. No nasal pain, drainage, facial pressure.  He reports his recovery went well. Voice improved.   PMHx: T2DM, GERD, CKD, HLD  H&N Surgery: Sinus surgery for deviated septum (done several years ago in [place]) Personal or FHx of bleeding dz or anesthesia difficulty: no  AP/AC: ASA 81 once per week GLP-1: on ozempic  for DM  Tobacco: no. Lives in [place], [place]  Independent Review of Additional Tests or Records:  Dr. Micheal (06/10/2022): vertigo - woke up with spinning, some nausea and 1 episode of vomiting; no headaches, hearing change, URI sx, no other sx. Meclizine  helped. Suspected BPPV, noted horizontal nystagmus; Discussed epley, Meclizine  PRN ED Notes Dr. Geroldine (06/22/2023): Vertigo for 3 weeks, mostly when waking up and changing position; no other sx; CT done, Rx meclizine  Vestibular rehab 07/13/2023: VOR noted normal, Left HIT covert positive; R right hallpike few beats R upbeating; Dx likely L posterior BPPV; now Asx, discharged Dr. Swaziland 08/16/2023: vertigo hsa resolved. Using epley; now just complains of rhinorrhea in mornings or after he eats; no congestion just PND; tried zyrtec, flonase , atrovent , irrigations, nthing helped. Reports negative allergy  test. Two prior nasal surgery [facility] 06/22/2023: reviewed independently, prior b/l max and loks like at least anterior ethmoidectomy; no significant opacification; mastoids and ME well aerated; given cut thickness can't assess superior SCC  well but no noted otic  capsule abnormality Dr. Roark (06/2019): post nasal drip for a while, tried OTC meds and when he eats. Rx: allegra, nasacort, suspected vasomotor rhinitis. Dr. Jeneal (Allergy  05/06/2022) - for rhinitis, tried atrovent  0.06% but using twice per; tested for allergies, did not work significantly. Allergy  testing 05/29/2022: positive for grass pollen, tree pollen, dust mite  PMH/Meds/All/SocHx/FamHx/ROS:   Past Medical History:  Diagnosis Date   Acute URI 10/10/2012   Allergy     seasonal   Anxiety    Cataract    Chest pain, atypical    Chronic kidney disease    Colon polyps    hyperplastic 2003   DJD (degenerative joint disease)    DM (diabetes mellitus) (HCC)    GERD (gastroesophageal reflux disease)    History of headache    Hypercholesteremia    Psoriasis    Vertigo      Past Surgical History:  Procedure Laterality Date   CATARACT EXTRACTION, BILATERAL Bilateral    Dr. Milan;    COLONOSCOPY     INGUINAL HERNIA REPAIR  1989   LEFT HEART CATH AND CORONARY ANGIOGRAPHY N/A 05/20/2020   Procedure: LEFT HEART CATH AND CORONARY ANGIOGRAPHY;  Surgeon: Dann Candyce RAMAN, MD;  Location: [facility];  Service: Cardiovascular;  Laterality: N/A;   NASAL SEPTUM SURGERY  1989   SINOSCOPY     TONSILLECTOMY      Family History  Problem Relation Age of Onset   Lung cancer Mother    Alzheimer's disease Father    Heart disease Father    Leukemia Sister    Allergic rhinitis Neg Hx    Asthma Neg Hx    Eczema Neg Hx    Urticaria Neg Hx    Colon cancer Neg Hx    Colon polyps Neg Hx    Esophageal cancer Neg Hx    Rectal cancer Neg Hx    Stomach cancer Neg Hx      Social Connections: Moderately Integrated (05/05/2023)   Social Connection and Isolation Panel    Frequency of Communication with Friends and Family: More than three times a week    Frequency of Social Gatherings with Friends and Family: More than three times a week    Attends Religious  Services: More than 4 times per year    Active Member of [employer] or Organizations: Yes    Attends Engineer, structural: More than 4 times per year    Marital Status: Divorced      Current Outpatient Medications:    acetaminophen  (TYLENOL ) 500 MG tablet, Take 2 tablets (1,000 mg total) by mouth every 6 (six) hours as needed., Disp: 30 tablet, Rfl: 0   aspirin  81 MG tablet, Take 1 tablet by mouth once a week., Disp: , Rfl:    azelastine  (ASTELIN ) 0.1 % nasal spray, Place 2 sprays into both nostrils 2 (two) times daily. Use in each nostril as directed, Disp: 30 mL, Rfl: 12   empagliflozin  (JARDIANCE ) 25 MG TABS tablet, Take 1 tablet (25 mg total) by mouth daily., Disp: 90 tablet, Rfl: 3   glipiZIDE  (GLUCOTROL ) 5 MG tablet, Take 1 tablet (5 mg total) by mouth daily before breakfast., Disp: 90 tablet, Rfl: 1   glucose blood (ONETOUCH ULTRA TEST) test strip, USE ti test blood sugar TWICE DAILY AS DIRECTED, Disp: 200 strip, Rfl: 3   ibuprofen  (ADVIL ) 200 MG tablet, Take 2 tablets (400 mg total) by mouth every 6 (six) hours as needed., Disp: 30 tablet, Rfl: 0   ipratropium (  ATROVENT ) 0.06 % nasal spray, Place 2 sprays each nostril up to 4 times a day for symptoms relief, Disp: 15 mL, Rfl: 5   Lancets (ONETOUCH DELICA PLUS LANCET33G) MISC, USE TO test blood sugar TWICE DAILY, Disp: 420 each, Rfl: 2   levocetirizine (XYZAL ) 5 MG tablet, TAKE 1 TABLET BY MOUTH EACH EVENING, Disp: 30 tablet, Rfl: 10   lisinopril  (ZESTRIL ) 10 MG tablet, Take 1 tablet (10 mg total) by mouth every evening., Disp: 90 tablet, Rfl: 1   meclizine  (ANTIVERT ) 25 MG tablet, Take 1 tablet (25 mg total) by mouth at bedtime as needed for dizziness., Disp: 20 tablet, Rfl: 0   metFORMIN  (GLUCOPHAGE ) 1000 MG tablet, Take 1 tablet (1,000 mg total) by mouth 2 (two) times daily with a meal., Disp: 180 tablet, Rfl: 1   nitroGLYCERIN  (NITROSTAT ) 0.4 MG SL tablet, Take one tablet sublingual as needed for chest pain up to three doses as  needed., Disp: 20 tablet, Rfl: 0   ondansetron  (ZOFRAN -ODT) 8 MG disintegrating tablet, PLACE ONE TABLET ON TONGUE AND ALLOW TO DISSOLVE EVERY FOUR HOURS AS NEEDED FOR nausea, Disp: 12 tablet, Rfl: 0   pioglitazone  (ACTOS ) 45 MG tablet, Take 1 tablet (45 mg total) by mouth every morning., Disp: 90 tablet, Rfl: 1   rosuvastatin  (CRESTOR ) 20 MG tablet, Take 1 tablet (20 mg total) by mouth daily., Disp: 90 tablet, Rfl: 2   Semaglutide , 1 MG/DOSE, 4 MG/3ML SOPN, Inject 1 mg as directed once a week., Disp: 3 mL, Rfl: 2   tadalafil  (CIALIS ) 20 MG tablet, TAKE ONE TABLET BY MOUTH every other DAY AS NEEDED, Disp: 30 tablet, Rfl: 3   vitamin B-12 (CYANOCOBALAMIN ) 1000 MCG tablet, Take 1,000 mcg by mouth at bedtime. , Disp: , Rfl:    Physical Exam:   BP (!) 150/70   Pulse 65   SpO2 96%   Salient findings:  CN II-XII intact  Bilateral EAC clear and TM intact with well pneumatized middle ear spaces Anterior rhinoscopy: Septum midline; bilateral inferior turbinates with postsurgical changes and reduced; Nasal endoscopy was indicated to better evaluate the nose and paranasal sinuses, given the patient's history and exam findings, and is detailed below. Head tilt negative No lesions of oral cavity/oropharynx No obviously palpable neck masses/lymphadenopathy/thyromegaly No respiratory distress or stridor; voice quality class 2   Seprately Identifiable Procedures:  Prior to proceeding, R/B/A for procedure were discussed and verbal consent was obtained PROCEDURE: Bilateral Diagnostic Rigid Nasal Endoscopy with Debridement Pre-procedure diagnosis: Vasomotor rhinitis, status post posterior nasal nerve ablation Post-procedure diagnosis: same Indication: See pre-procedure diagnosis and physical exam above Complications: None apparent EBL: 0 mL Anesthesia: Lidocaine  4% and topical decongestant was topically sprayed in each nasal cavity  Description of Procedure:    Patient was identified. A rigid 30 degree  endoscope was utilized to evaluate the sinonasal cavities, mucosa, sinus ostia and turbinates and septum.  Overall, signs of mucosal inflammation are noted, mild.  Also noted are post-surgical changes - bilateral turbinates resected partially; max and ethmoids patent bilaterally with crusting lateral nasal sidewall around middle turbinate stumps with right partial middle turbinectomy. These were debrided with 8 fr suction, with improvement in patency.  No mucopurulence, polyps, or masses noted.  No evidence of active CSF leak on valsalva Right Middle meatus: clear Right SE Recess: clear Left MM: clear Left SE Recess: clear  CPT CODE -- 31237 - Mod 25, 50  Impression & Plans:  Guido Comp is a 78 y.o. male with:  1. Vasomotor  rhinitis   2. S/P FESS (functional endoscopic sinus surgery)   3. Post-nasal drip   4. Dysphonia   5. Vocal fold atrophy    Primary complaint is really rhinitis with eating, though it is occurring at all the time. Improved on sprays but not enough benefit so underwent b/l Clarifix 01/10/2024. He is doing much better afterwards. Still some dripping but certainly singificantly improved.  - Would rec restarting flonase  BID and atrovent  0.06% BID - f/u 6 months.  Dysphonia: query LPR with contribution from atrophy - improved, will observe  - f/u 6 months  See below regarding exact medications prescribed this encounter including dosages and route: see above - restart atrovent  and flonase    Thank you for allowing me the opportunity to care for your patient. Please do not hesitate to contact me should you have any other questions.  Sincerely, Eldora Blanch, MD Otolaryngologist (ENT), [facility] Phone: [phone] Fax: [phone]  02/20/2024, 9:48 AM   MDM:  Level 3: 99213 Complexity/Problems addressed: low Data complexity: low - Morbidity: mod  - Prescription Drug prescribed or managed: y

## 2024-03-06 ENCOUNTER — Other Ambulatory Visit: Payer: Self-pay | Admitting: Family Medicine

## 2024-03-29 DIAGNOSIS — E119 Type 2 diabetes mellitus without complications: Secondary | ICD-10-CM | POA: Diagnosis not present

## 2024-04-04 ENCOUNTER — Other Ambulatory Visit: Payer: Self-pay | Admitting: Internal Medicine

## 2024-04-11 ENCOUNTER — Telehealth: Payer: Self-pay

## 2024-04-11 NOTE — Telephone Encounter (Signed)
 I left pt a voicemail letting him know his Ozempic came in and is ready for pick up.

## 2024-05-03 LAB — MICROALBUMIN / CREATININE URINE RATIO: Albumin/Creatinine Ratio, Urine, POC: 59

## 2024-05-08 ENCOUNTER — Ambulatory Visit (INDEPENDENT_AMBULATORY_CARE_PROVIDER_SITE_OTHER): Payer: Medicare Other

## 2024-05-08 VITALS — Ht 72.0 in | Wt 195.0 lb

## 2024-05-08 DIAGNOSIS — Z0000 Encounter for general adult medical examination without abnormal findings: Secondary | ICD-10-CM | POA: Diagnosis not present

## 2024-05-08 NOTE — Patient Instructions (Addendum)
 Mr. Lucas Warren,  Thank you for taking the time for your Medicare Wellness Visit. I appreciate your continued commitment to your health goals. Please review the care plan we discussed, and feel free to reach out if I can assist you further.  Medicare recommends these wellness visits once per year to help you and your care team stay ahead of potential health issues. These visits are designed to focus on prevention, allowing your provider to concentrate on managing your acute and chronic conditions during your regular appointments.  Please note that Annual Wellness Visits do not include a physical exam. Some assessments may be limited, especially if the visit was conducted virtually. If needed, we may recommend a separate in-person follow-up with your provider.  Ongoing Care Seeing your primary care provider every 3 to 6 months helps us  monitor your health and provide consistent, personalized care.   Referrals If a referral was made during today's visit and you haven't received any updates within two weeks, please contact the referred provider directly to check on the status.  Recommended Screenings:  Health Maintenance  Topic Date Due   Zoster (Shingles) Vaccine (1 of 2) 01/11/1998   Pneumococcal Vaccine for age over 82 (3 of 3 - PCV20 or PCV21) 12/05/2017   Yearly kidney health urinalysis for diabetes  06/11/2021   Complete foot exam   03/28/2024   Flu Shot  03/31/2024   COVID-19 Vaccine (4 - 2025-26 season) 05/01/2024   Hemoglobin A1C  05/17/2024   Yearly kidney function blood test for diabetes  08/15/2024   Eye exam for diabetics  04/07/2025   Medicare Annual Wellness Visit  05/08/2025   DTaP/Tdap/Td vaccine (3 - Td or Tdap) 03/25/2026   Hepatitis C Screening  Completed   HPV Vaccine  Aged Out   Meningitis B Vaccine  Aged Out   Colon Cancer Screening  Discontinued       05/08/2024    1:14 PM  Advanced Directives  Does Patient Have a Medical Advance Directive? Yes  Type of Sports coach of [place];Living will  Copy of Healthcare Power of Attorney in Chart? No - copy requested   Advance Care Planning is important because it: Ensures you receive medical care that aligns with your values, goals, and preferences. Provides guidance to your family and loved ones, reducing the emotional burden of decision-making during critical moments.  Vision: Annual vision screenings are recommended for early detection of glaucoma, cataracts, and diabetic retinopathy. These exams can also reveal signs of chronic conditions such as diabetes and high blood pressure.  Dental: Annual dental screenings help detect early signs of oral cancer, gum disease, and other conditions linked to overall health, including heart disease and diabetes.  Please see the attached documents for additional preventive care recommendations.

## 2024-05-08 NOTE — Progress Notes (Addendum)
 Subjective:   Lucas Warren is a 78 y.o. who presents for a Medicare Wellness preventive visit.  As a reminder, Annual Wellness Visits don't include a physical exam, and some assessments may be limited, especially if this visit is performed virtually. We may recommend an in-person follow-up visit with your provider if needed.  Visit Complete: Virtual I connected with  Lucas Warren on 05/08/24 by a audio enabled telemedicine application and verified that I am speaking with the correct person using two identifiers.  Patient Location: Home  Provider Location: Home Office  I discussed the limitations of evaluation and management by telemedicine. The patient expressed understanding and agreed to proceed.  Vital Signs: Because this visit was a virtual/telehealth visit, some criteria may be missing or patient reported. Any vitals not documented were not able to be obtained and vitals that have been documented are patient reported.   Persons Participating in Visit: Patient.  AWV Questionnaire: Yes: Patient Medicare AWV questionnaire was completed by the patient on 05/08/24; I have confirmed that all information answered by patient is correct and no changes since this date.  Cardiac Risk Factors include: advanced age (>42men, >39 women);diabetes mellitus;hypertension     Objective:    Today's Vitals   05/08/24 1304  Weight: 195 lb (88.5 kg)  Height: 6' (1.829 m)   Body mass index is 26.45 kg/m.     05/08/2024    1:14 PM 10/14/2023   11:03 AM 07/13/2023    8:50 AM 06/22/2023    1:02 AM 05/05/2023   12:46 PM 04/30/2022    9:33 AM 04/29/2021    9:10 AM  Advanced Directives  Does Patient Have a Medical Advance Directive? Yes Yes Yes No Yes Yes Yes  Type of Estate agent of [place];Living will    Healthcare Power of [place];Living will Healthcare Power of [place];Living will Healthcare Power of [place];Living will  Does patient want to make changes to medical  advance directive?   No - Patient declined      Copy of Healthcare Power of Attorney in Chart? No - copy requested    No - copy requested No - copy requested No - copy requested    Current Medications (verified) Outpatient Encounter Medications as of 05/08/2024  Medication Sig   acetaminophen  (TYLENOL ) 500 MG tablet Take 2 tablets (1,000 mg total) by mouth every 6 (six) hours as needed.   aspirin  81 MG tablet Take 1 tablet by mouth once a week.   azelastine  (ASTELIN ) 0.1 % nasal spray Place 2 sprays into both nostrils 2 (two) times daily. Use in each nostril as directed   empagliflozin  (JARDIANCE ) 25 MG TABS tablet Take 1 tablet (25 mg total) by mouth daily.   glipiZIDE  (GLUCOTROL ) 5 MG tablet Take 1 tablet (5 mg total) by mouth daily before breakfast.   glucose blood (ONETOUCH ULTRA TEST) test strip USE ti test blood sugar TWICE DAILY AS DIRECTED   ibuprofen  (ADVIL ) 200 MG tablet Take 2 tablets (400 mg total) by mouth every 6 (six) hours as needed.   ipratropium (ATROVENT ) 0.06 % nasal spray Place 2 sprays each nostril up to 4 times a day for symptoms relief   Lancets (ONETOUCH DELICA PLUS LANCET33G) MISC USE TO test blood sugar TWICE DAILY   levocetirizine (XYZAL ) 5 MG tablet TAKE 1 TABLET BY MOUTH EACH EVENING   lisinopril  (ZESTRIL ) 10 MG tablet TAKE ONE TABLET (10mg  total) BY MOUTH EVERY IN THE EVENING   meclizine  (ANTIVERT ) 25 MG tablet  Take 1 tablet (25 mg total) by mouth at bedtime as needed for dizziness.   metFORMIN  (GLUCOPHAGE ) 1000 MG tablet Take 1 tablet (1,000 mg total) by mouth 2 (two) times daily with a meal.   nitroGLYCERIN  (NITROSTAT ) 0.4 MG SL tablet Take one tablet sublingual as needed for chest pain up to three doses as needed.   ondansetron  (ZOFRAN -ODT) 8 MG disintegrating tablet PLACE ONE TABLET ON TONGUE AND ALLOW TO DISSOLVE EVERY FOUR HOURS AS NEEDED FOR nausea   pioglitazone  (ACTOS ) 45 MG tablet TAKE ONE TABLET BY MOUTH EVERY MORNING   rosuvastatin  (CRESTOR ) 20 MG  tablet Take 1 tablet (20 mg total) by mouth daily.   Semaglutide , 1 MG/DOSE, 4 MG/3ML SOPN Inject 1 mg as directed once a week.   tadalafil  (CIALIS ) 20 MG tablet TAKE ONE TABLET BY MOUTH every other DAY AS NEEDED   vitamin B-12 (CYANOCOBALAMIN ) 1000 MCG tablet Take 1,000 mcg by mouth at bedtime.    No facility-administered encounter medications on file as of 05/08/2024.    Allergies (verified) Lisinopril , Erythromycin, and Trulicity  [dulaglutide ]   History: Past Medical History:  Diagnosis Date   Acute URI 10/10/2012   Allergy     seasonal   Anxiety    Cataract    Chest pain, atypical    Chronic kidney disease    Colon polyps    hyperplastic 2003   DJD (degenerative joint disease)    DM (diabetes mellitus) (HCC)    GERD (gastroesophageal reflux disease)    History of headache    Hypercholesteremia    Psoriasis    Vertigo    Past Surgical History:  Procedure Laterality Date   CATARACT EXTRACTION, BILATERAL Bilateral    Dr. Milan;    COLONOSCOPY     INGUINAL HERNIA REPAIR  1989   LEFT HEART CATH AND CORONARY ANGIOGRAPHY N/A 05/20/2020   Procedure: LEFT HEART CATH AND CORONARY ANGIOGRAPHY;  Surgeon: Dann Candyce RAMAN, MD;  Location: [facility];  Service: Cardiovascular;  Laterality: N/A;   NASAL SEPTUM SURGERY  1989   SINOSCOPY     TONSILLECTOMY     Family History  Problem Relation Age of Onset   Lung cancer Mother    Alzheimer's disease Father    Heart disease Father    Leukemia Sister    Allergic rhinitis Neg Hx    Asthma Neg Hx    Eczema Neg Hx    Urticaria Neg Hx    Colon cancer Neg Hx    Colon polyps Neg Hx    Esophageal cancer Neg Hx    Rectal cancer Neg Hx    Stomach cancer Neg Hx    Social History   Socioeconomic History   Marital status: Divorced    Spouse name: carolyn   Number of children: Not on file   Years of education: Not on file   Highest education level: GED or equivalent  Occupational History    Comment: Therapist, occupational  Tobacco Use   Smoking status: Former    Current packs/day: 0.00    Average packs/day: 2.0 packs/day for 20.0 years (40.0 ttl pk-yrs)    Types: Cigarettes    Start date: 08/31/1961    Quit date: 08/31/1981    Years since quitting: 42.7   Smokeless tobacco: Never  Vaping Use   Vaping status: Never Used  Substance and Sexual Activity   Alcohol use: Yes    Comment: rarely a beer   Drug use: Never   Sexual activity: Not  Currently  Other Topics Concern   Not on file  Social History Narrative   Not on file   Social Drivers of Health   Financial Resource Strain: Low Risk  (05/08/2024)   Overall Financial Resource Strain (CARDIA)    Difficulty of Paying Living Expenses: Not very hard  Food Insecurity: No Food Insecurity (05/08/2024)   Hunger Vital Sign    Worried About Running Out of Food in the Last Year: Never true    Ran Out of Food in the Last Year: Never true  Transportation Needs: No Transportation Needs (05/08/2024)   PRAPARE - Administrator, Civil Service (Medical): No    Lack of Transportation (Non-Medical): No  Physical Activity: Insufficiently Active (05/08/2024)   Exercise Vital Sign    Days of Exercise per Week: 3 days    Minutes of Exercise per Session: 30 min  Stress: No Stress Concern Present (05/08/2024)   [employer] of Occupational Health - Occupational Stress Questionnaire    Feeling of Stress: Only a little  Social Connections: Moderately Integrated (05/08/2024)   Social Connection and Isolation Panel    Frequency of Communication with Friends and Family: More than three times a week    Frequency of Social Gatherings with Friends and Family: More than three times a week    Attends Religious Services: 1 to 4 times per year    Active Member of [employer] or Organizations: Yes    Attends Engineer, structural: More than 4 times per year    Marital Status: Divorced    Tobacco Counseling Counseling given: Not Answered    Clinical  Intake:  Pre-visit preparation completed: Yes  Pain : No/denies pain     BMI - recorded: 26.45 Nutritional Status: BMI 25 -29 Overweight Nutritional Risks: None Diabetes: Yes CBG done?: Yes (CGB 89 Per patient) CBG resulted in Enter/ Edit results?: Yes Did pt. bring in CBG monitor from home?: No  Lab Results  Component Value Date   HGBA1C 6.7 11/15/2023   HGBA1C 7.5 (A) 08/16/2023   HGBA1C 7.0 (A) 03/29/2023     How often do you need to have someone help you when you read instructions, pamphlets, or other written materials from your doctor or pharmacy?: 1 - Never  Interpreter Needed?: No  Information entered by :: Rojelio Blush LPN   Activities of Daily Living     05/08/2024    1:10 PM 05/08/2024    1:09 PM  In your present state of health, do you have any difficulty performing the following activities:  Hearing? 0 0  Vision? 0 0  Difficulty concentrating or making decisions? 0 0  Walking or climbing stairs? 0 0  Dressing or bathing? 0 0  Doing errands, shopping? 0 0  Preparing Food and eating ? N N  Using the Toilet? N N  In the past six months, have you accidently leaked urine? N N  Do you have problems with loss of bowel control? N N  Managing your Medications? N N  Managing your Finances? N N  Housekeeping or managing your Housekeeping? N N    Patient Care Team: [place], Betty G, MD as PCP - General (Family Medicine) Thukkani, Arun K, MD as PCP - Cardiology (Cardiology) Liane Sharyne MATSU, [facility] (Inactive) as Pharmacist (Pharmacist)  I have updated your Care Teams any recent Medical Services you may have received from other providers in the past year.     Assessment:   This is a routine wellness examination  for Lucas Warren.  Hearing/Vision screen Hearing Screening - Comments:: Denies hearing difficulties   Vision Screening - Comments:: Wears rx glasses - up to date with routine eye exams with Dr Newt    Goals Addressed               This Visit's  Progress     Increase physical activity (pt-stated)        Remain active       Depression Screen     05/08/2024    1:16 PM 10/14/2023   11:02 AM 05/05/2023   12:45 PM 12/29/2022   10:59 AM 09/02/2022    3:34 PM 04/30/2022    9:29 AM 11/26/2021   10:39 AM  PHQ 2/9 Scores  PHQ - 2 Score 0 1 0 0 2 0 2  PHQ- 9 Score    0 9  4    Fall Risk     05/08/2024    1:11 PM 05/07/2024    2:59 PM 10/14/2023   11:02 AM 05/05/2023   12:46 PM 12/29/2022   10:59 AM  Fall Risk   Falls in the past year? 0 1 0 0 0  Number falls in past yr: 0 0  0 0  Injury with Fall? 0 0  0 0  Risk for fall due to : No Fall Risks   No Fall Risks No Fall Risks  Follow up Falls evaluation completed   Falls prevention discussed Falls evaluation completed    MEDICARE RISK AT HOME:  Medicare Risk at Home Any stairs in or around the home?: No If so, are there any without handrails?: No Home free of loose throw rugs in walkways, pet beds, electrical cords, etc?: Yes Adequate lighting in your home to reduce risk of falls?: Yes Life alert?: No Use of a cane, walker or w/c?: No Grab bars in the bathroom?: Yes Shower chair or bench in shower?: No Elevated toilet seat or a handicapped toilet?: Yes  TIMED UP AND GO:  Was the test performed?  No  Cognitive Function: 6CIT completed    01/18/2018    9:42 AM 01/06/2017    8:44 AM  MMSE - Mini Mental State Exam  Not completed: -- --        05/08/2024    1:14 PM 05/05/2023   12:47 PM 04/30/2022    9:33 AM  6CIT Screen  What Year? 0 points 0 points 0 points  What month? 0 points 0 points 0 points  What time? 0 points 0 points 0 points  Count back from 20 0 points 0 points 0 points  Months in reverse 0 points 0 points 0 points  Repeat phrase 0 points 0 points 0 points  Total Score 0 points 0 points 0 points    Immunizations Immunization History  Administered Date(s) Administered   Fluad Quad(high Dose 65+) 05/09/2019, 06/11/2020, 06/30/2022   Fluad Trivalent(High Dose  65+) 06/11/2023   INFLUENZA, HIGH DOSE SEASONAL PF 05/22/2015, 06/29/2016, 06/15/2017, 05/20/2018   Influenza Split 10/05/2011   Influenza Whole 07/16/2009   Influenza,inj,Quad PF,6+ Mos 07/07/2013, 05/04/2014   PFIZER(Purple Top)SARS-COV-2 Vaccination 10/22/2019, 11/15/2019, 07/09/2020   Pneumococcal Conjugate-13 08/21/2014   Pneumococcal Polysaccharide-23 12/05/2012   Pneumococcal-Unspecified 10/11/2007   Td 03/09/2006   Tdap 03/25/2016   Zoster, Live 03/31/2016    Screening Tests Health Maintenance  Topic Date Due   Zoster Vaccines- Shingrix (1 of 2) 01/11/1998   Pneumococcal Vaccine: 50+ Years (3 of 3 - PCV20 or PCV21) 12/05/2017  Diabetic kidney evaluation - Urine ACR  06/11/2021   FOOT EXAM  03/28/2024   Influenza Vaccine  03/31/2024   COVID-19 Vaccine (4 - 2025-26 season) 05/01/2024   HEMOGLOBIN A1C  05/17/2024   Diabetic kidney evaluation - eGFR measurement  08/15/2024   OPHTHALMOLOGY EXAM  04/07/2025   Medicare Annual Wellness (AWV)  05/08/2025   DTaP/Tdap/Td (3 - Td or Tdap) 03/25/2026   Hepatitis C Screening  Completed   HPV VACCINES  Aged Out   Meningococcal B Vaccine  Aged Out   Colonoscopy  Discontinued    Health Maintenance Items Addressed:   Additional Screening:  Vision Screening: Recommended annual ophthalmology exams for early detection of glaucoma and other disorders of the eye. Is the patient up to date with their annual eye exam?  Yes Who is the provider or what is the name of the office in which the patient attends annual eye exams? Dr Newt  Dental Screening: Recommended annual dental exams for proper oral hygiene  Community Resource Referral / Chronic Care Management: CRR required this visit?  No   CCM required this visit?  No   Plan:    I have personally reviewed and noted the following in the patient's chart:   Medical and social history Use of alcohol, tobacco or illicit drugs  Current medications and supplements including  opioid prescriptions. Patient is not currently taking opioid prescriptions. Functional ability and status Nutritional status Physical activity Advanced directives List of other physicians Hospitalizations, surgeries, and ER visits in previous 12 months Vitals Screenings to include cognitive, depression, and falls Referrals and appointments  In addition, I have reviewed and discussed with patient certain preventive protocols, quality metrics, and best practice recommendations. A written personalized care plan for preventive services as well as general preventive health recommendations were provided to patient.   Rojelio LELON Blush, LPN   [id number]/08/7972   After Visit Summary: (MyChart) Due to this being a telephonic visit, the after visit summary with patients personalized plan was offered to patient via MyChart   Notes: Nothing significant to report at this time.

## 2024-05-19 ENCOUNTER — Encounter: Payer: Self-pay | Admitting: Family Medicine

## 2024-05-19 ENCOUNTER — Ambulatory Visit: Admitting: Family Medicine

## 2024-05-19 VITALS — BP 104/52 | HR 63 | Temp 98.0°F | Resp 16 | Ht 72.0 in | Wt 200.0 lb

## 2024-05-19 DIAGNOSIS — E1122 Type 2 diabetes mellitus with diabetic chronic kidney disease: Secondary | ICD-10-CM

## 2024-05-19 DIAGNOSIS — N1831 Chronic kidney disease, stage 3a: Secondary | ICD-10-CM

## 2024-05-19 DIAGNOSIS — I10 Essential (primary) hypertension: Secondary | ICD-10-CM

## 2024-05-19 DIAGNOSIS — Z23 Encounter for immunization: Secondary | ICD-10-CM

## 2024-05-19 DIAGNOSIS — Z7985 Long-term (current) use of injectable non-insulin antidiabetic drugs: Secondary | ICD-10-CM | POA: Diagnosis not present

## 2024-05-19 DIAGNOSIS — R42 Dizziness and giddiness: Secondary | ICD-10-CM | POA: Diagnosis not present

## 2024-05-19 DIAGNOSIS — R233 Spontaneous ecchymoses: Secondary | ICD-10-CM

## 2024-05-19 DIAGNOSIS — Z7984 Long term (current) use of oral hypoglycemic drugs: Secondary | ICD-10-CM

## 2024-05-19 LAB — BASIC METABOLIC PANEL WITH GFR
BUN: 21 mg/dL (ref 6–23)
CO2: 27 meq/L (ref 19–32)
Calcium: 9.8 mg/dL (ref 8.4–10.5)
Chloride: 107 meq/L (ref 96–112)
Creatinine, Ser: 1.61 mg/dL — ABNORMAL HIGH (ref 0.40–1.50)
GFR: 41.33 mL/min — ABNORMAL LOW (ref >60.00)
Glucose, Bld: 165 mg/dL — ABNORMAL HIGH (ref 70–99)
Potassium: 4.2 meq/L (ref 3.5–5.1)
Sodium: 141 meq/L (ref 135–145)

## 2024-05-19 LAB — POCT GLYCOSYLATED HEMOGLOBIN (HGB A1C): Hemoglobin A1C: 6.2 % — AB (ref 4.0–5.6)

## 2024-05-19 LAB — MICROALBUMIN / CREATININE URINE RATIO
Creatinine,U: 89.9 mg/dL
Microalb Creat Ratio: 108.5 mg/g — ABNORMAL HIGH (ref 0.0–30.0)
Microalb, Ur: 9.7 mg/dL — ABNORMAL HIGH (ref 0.0–1.9)

## 2024-05-19 NOTE — Assessment & Plan Note (Signed)
 Problem has been stable, creatinine 1.5 and eGFR 44 in 08/2023. Stressed importance of adequate hydration. Continue low-salt diet, avoidance of NSAIDs, and adequate BP and glucose control.

## 2024-05-19 NOTE — Assessment & Plan Note (Signed)
 Problem is well-controlled, hemoglobin A1c went from 6.7 to 6.2 today. For now no changes in current management, continue Jardiance  25 mg daily, Glipizide  5 mg daily, Metformin  1000 mg twice daily, Actos  45 mg daily, and Ozempic  1 mg weekly.  Annual eye exam, periodic dental and foot care to continue. F/U in 5-6 months.

## 2024-05-19 NOTE — Assessment & Plan Note (Signed)
 BP on the lower normal range today. He is not monitoring BP at home, recommend doing so. Continue lisinopril  10 mg daily and low-salt diet.

## 2024-05-19 NOTE — Assessment & Plan Note (Signed)
 We discussed diagnosis and prognosis. Try to avoid trauma. Avoid any direct UV exposure on affected areas to decrease risk of postinflammatory pigmentation changes.

## 2024-05-19 NOTE — Progress Notes (Signed)
 Chief Complaint  Patient presents with   Medical Management of Chronic Issues   Discussed the use of AI scribe software for clinical note transcription with the patient, who gave verbal consent to proceed.  History of Present Illness Lucas Warren is a 78 year old male with a PMHx significant for HTN, venous insufficiency, GERD, DM II, degenerative joint disease, CKD III, anxiety, HLD, and normocytic anemia, who presents for a follow-up visit. He was last seen on 11/15/23.  Since his last visit he has undergone nasal surgery with an ENT, but it did not alleviate his symptoms of vasomotor rhinitis. He reports persistent runny nose, especially when eating, and plans to follow up with the ENT in December/2025.  He experiences vertigo, which he describes as horrible, it is like movement/ spinning sensation and takes meclizine  as needed. He has not had any since his last visit.  -Hypertension:He is on lisinopril  10 mg He has not been checking his blood pressure at home but has a cuff to do so. Negative for unusual or severe headache, visual changes, exertional chest pain, dyspnea,  focal weakness, or worsening edema.  CKD III: He reports increased urinary frequency but no pain. He drinks fluids throughout the day, including water, Dr. Nunzio, and occasionally apple juice.  LE edema, worse at the end of the day. LE elevation helps. He elevates his legs while watching TV and has not tried compression stockings.  Lab Results  Component Value Date   NA 143 08/16/2023   CL 107 08/16/2023   K 4.4 08/16/2023   CO2 25 08/16/2023   BUN 25 (H) 08/16/2023   CREATININE 1.53 (H) 08/16/2023   GFR 44.17 (L) 08/16/2023   CALCIUM  9.7 08/16/2023   ALBUMIN 4.0 11/01/2023   GLUCOSE 149 (H) 08/16/2023   -Diabetes Mellitus II: Dx'ed 10+ years ago.  Checking BG at home: 80's-low 110's. Currently on Jardiance  25 mg daily, Glipizide  5 mg daily, Metformin  1000 mg twice daily, Actos  45 mg  daily, and Ozempic  1 mg weekly.   Negative for symptoms of hypoglycemia, polydipsia, numbness extremities, foot ulcers/trauma.  Reports that [employer] conducted a home checkup and found his urine sample to be 'a little high' in some areas, though he is unsure of the specifics.   Lab Results  Component Value Date   HGBA1C 6.7 11/15/2023   Lab Results  Component Value Date   MICROALBUR 2.5 06/11/2020   He is socially active, attending the [facility] and [employer] multiple times a month. He participates in dinners, breakfasts, and fundraisers.   Easy bruising on forearms, intermittent for years. No blood in stool or melena.  Review of Systems  Constitutional:  Negative for activity change, appetite change and fever.  HENT:  Negative for mouth sores and sore throat.   Respiratory:  Negative for cough and wheezing.   Gastrointestinal:  Negative for abdominal pain, nausea and vomiting.  Genitourinary:  Negative for decreased urine volume, dysuria and hematuria.  Skin:  Negative for rash.  Neurological:  Negative for syncope and facial asymmetry.  Psychiatric/Behavioral:  Negative for confusion and hallucinations.   See other pertinent positives and negatives in HPI.  Current Outpatient Medications on File Prior to Visit  Medication Sig Dispense Refill   aspirin  81 MG tablet Take 1 tablet by mouth once a week.     azelastine  (ASTELIN ) 0.1 % nasal spray Place 2 sprays into both nostrils 2 (two) times daily. Use in each nostril as directed 30  mL 12   empagliflozin  (JARDIANCE ) 25 MG TABS tablet Take 1 tablet (25 mg total) by mouth daily. 90 tablet 3   glipiZIDE  (GLUCOTROL ) 5 MG tablet Take 1 tablet (5 mg total) by mouth daily before breakfast. 90 tablet 1   glucose blood (ONETOUCH ULTRA TEST) test strip USE ti test blood sugar TWICE DAILY AS DIRECTED 200 strip 3   Lancets (ONETOUCH DELICA PLUS LANCET33G) MISC USE TO test blood sugar TWICE DAILY 420 each 2   lisinopril  (ZESTRIL )  10 MG tablet TAKE ONE TABLET (10mg  total) BY MOUTH EVERY IN THE EVENING 90 tablet 1   metFORMIN  (GLUCOPHAGE ) 1000 MG tablet Take 1 tablet (1,000 mg total) by mouth 2 (two) times daily with a meal. 180 tablet 1   pioglitazone  (ACTOS ) 45 MG tablet TAKE ONE TABLET BY MOUTH EVERY MORNING 90 tablet 1   rosuvastatin  (CRESTOR ) 20 MG tablet Take 1 tablet (20 mg total) by mouth daily. 90 tablet 2   Semaglutide , 1 MG/DOSE, 4 MG/3ML SOPN Inject 1 mg as directed once a week. 3 mL 2   vitamin B-12 (CYANOCOBALAMIN ) 1000 MCG tablet Take 1,000 mcg by mouth at bedtime.      acetaminophen  (TYLENOL ) 500 MG tablet Take 2 tablets (1,000 mg total) by mouth every 6 (six) hours as needed. (Patient not taking: Reported on 05/19/2024) 30 tablet 0   ibuprofen  (ADVIL ) 200 MG tablet Take 2 tablets (400 mg total) by mouth every 6 (six) hours as needed. (Patient not taking: Reported on 05/19/2024) 30 tablet 0   ipratropium (ATROVENT ) 0.06 % nasal spray Place 2 sprays each nostril up to 4 times a day for symptoms relief 15 mL 5   levocetirizine (XYZAL ) 5 MG tablet TAKE 1 TABLET BY MOUTH EACH EVENING (Patient not taking: Reported on 05/19/2024) 30 tablet 10   meclizine  (ANTIVERT ) 25 MG tablet Take 1 tablet (25 mg total) by mouth at bedtime as needed for dizziness. (Patient not taking: Reported on 05/19/2024) 20 tablet 0   nitroGLYCERIN  (NITROSTAT ) 0.4 MG SL tablet Take one tablet sublingual as needed for chest pain up to three doses as needed. (Patient not taking: Reported on 05/19/2024) 20 tablet 0   ondansetron  (ZOFRAN -ODT) 8 MG disintegrating tablet PLACE ONE TABLET ON TONGUE AND ALLOW TO DISSOLVE EVERY FOUR HOURS AS NEEDED FOR nausea (Patient not taking: Reported on 05/19/2024) 12 tablet 0   tadalafil  (CIALIS ) 20 MG tablet TAKE ONE TABLET BY MOUTH every other DAY AS NEEDED (Patient not taking: Reported on 05/19/2024) 30 tablet 3   No current facility-administered medications on file prior to visit.    Past Medical History:   Diagnosis Date   Acute URI 10/10/2012   Allergy     seasonal   Anxiety    Cataract    Chest pain, atypical    Chronic kidney disease    Colon polyps    hyperplastic 2003   DJD (degenerative joint disease)    DM (diabetes mellitus) (HCC)    GERD (gastroesophageal reflux disease)    History of headache    Hypercholesteremia    Psoriasis    Vertigo     Allergies  Allergen Reactions   Lisinopril  Cough   Erythromycin     REACTION: abd pain   Trulicity  [Dulaglutide ] Nausea Only    Social History   Socioeconomic History   Marital status: Divorced    Spouse name: carolyn   Number of children: Not on file   Years of education: Not on file   Highest education level:  GED or equivalent  Occupational History    Comment: Curator  Tobacco Use   Smoking status: Former    Current packs/day: 0.00    Average packs/day: 2.0 packs/day for 20.0 years (40.0 ttl pk-yrs)    Types: Cigarettes    Start date: 08/31/1961    Quit date: 08/31/1981    Years since quitting: 42.7   Smokeless tobacco: Never  Vaping Use   Vaping status: Never Used  Substance and Sexual Activity   Alcohol use: Yes    Comment: rarely a beer   Drug use: Never   Sexual activity: Not Currently  Other Topics Concern   Not on file  Social History Narrative   Not on file   Social Drivers of Health   Financial Resource Strain: Low Risk  (05/08/2024)   Overall Financial Resource Strain (CARDIA)    Difficulty of Paying Living Expenses: Not very hard  Food Insecurity: No Food Insecurity (05/08/2024)   Hunger Vital Sign    Worried About Running Out of Food in the Last Year: Never true    Ran Out of Food in the Last Year: Never true  Transportation Needs: No Transportation Needs (05/08/2024)   PRAPARE - Administrator, Civil Service (Medical): No    Lack of Transportation (Non-Medical): No  Physical Activity: Insufficiently Active (05/08/2024)   Exercise Vital Sign    Days of Exercise per Week:  3 days    Minutes of Exercise per Session: 30 min  Stress: No Stress Concern Present (05/08/2024)   [employer] of Occupational Health - Occupational Stress Questionnaire    Feeling of Stress: Only a little  Social Connections: Moderately Integrated (05/08/2024)   Social Connection and Isolation Panel    Frequency of Communication with Friends and Family: More than three times a week    Frequency of Social Gatherings with Friends and Family: More than three times a week    Attends Religious Services: 1 to 4 times per year    Active Member of [employer] or Organizations: Yes    Attends Engineer, structural: More than 4 times per year    Marital Status: Divorced    Today's Vitals   05/19/24 1009  BP: (!) 104/52  Pulse: 63  Resp: 16  Temp: 98 F (36.7 C)  TempSrc: Oral  SpO2: 97%  Weight: 200 lb (90.7 kg)  Height: 6' (1.829 m)   Body mass index is 27.12 kg/m.  Physical Exam Vitals and nursing note reviewed.  Constitutional:      General: He is not in acute distress.    Appearance: He is well-developed.  HENT:     Head: Normocephalic and atraumatic.     Mouth/Throat:     Mouth: Mucous membranes are moist.     Pharynx: Oropharynx is clear. Uvula midline.  Eyes:     Conjunctiva/sclera: Conjunctivae normal.  Cardiovascular:     Rate and Rhythm: Normal rate and regular rhythm.     Pulses:          Dorsalis pedis pulses are 2+ on the right side and 2+ on the left side.     Heart sounds: No murmur heard.    Comments: Varicose veins LE's. Pulmonary:     Effort: Pulmonary effort is normal. No respiratory distress.     Breath sounds: Normal breath sounds.  Abdominal:     Palpations: Abdomen is soft. There is no hepatomegaly or mass.     Tenderness: There is no  abdominal tenderness.  Musculoskeletal:     Right lower leg: No edema.     Left lower leg: No edema.  Skin:    General: Skin is warm.     Findings: Ecchymosis (forearms) present. No erythema or rash.   Neurological:     Mental Status: He is alert and oriented to person, place, and time.     Cranial Nerves: No cranial nerve deficit.     Gait: Gait normal.  Psychiatric:        Mood and Affect: Mood and affect normal.   ASSESSMENT AND PLAN:  Mr. Elic Vencill was seen today for medical management of chronic issues.  Diagnoses and all orders for this visit:  Orders Placed This Encounter  Procedures   Microalbumin / creatinine urine ratio   Basic metabolic panel with GFR   POC HgB A1c   Lab Results  Component Value Date   HGBA1C 6.2 (A) 05/19/2024   Lab Results  Component Value Date   NA 141 05/19/2024   CL 107 05/19/2024   K 4.2 05/19/2024   CO2 27 05/19/2024   BUN 21 05/19/2024   CREATININE 1.61 (H) 05/19/2024   GFR 41.33 (L) 05/19/2024   CALCIUM  9.8 05/19/2024   ALBUMIN 4.0 11/01/2023   GLUCOSE 165 (H) 05/19/2024   Lab Results  Component Value Date   MICROALBUR 9.7 (H) 05/19/2024   MICROALBUR 2.5 06/11/2020   Type 2 diabetes mellitus with stage 3a chronic kidney disease, without long-term current use of insulin (HCC) Assessment & Plan: Problem is well-controlled, hemoglobin A1c went from 6.7 to 6.2 today. For now no changes in current management, continue Jardiance  25 mg daily, Glipizide  5 mg daily, Metformin  1000 mg twice daily, Actos  45 mg daily, and Ozempic  1 mg weekly.  Annual eye exam, periodic dental and foot care to continue. F/U in 5-6 months.  Orders: -     POCT glycosylated hemoglobin (Hb A1C) -     Microalbumin / creatinine urine ratio; Future  Primary hypertension Assessment & Plan: BP on the lower normal range today. He is not monitoring BP at home, recommend doing so. Continue lisinopril  10 mg daily and low-salt diet.  Orders: -     Basic metabolic panel with GFR; Future  Stage 3a chronic kidney disease (HCC) Assessment & Plan: Problem has been stable, creatinine 1.5 and eGFR 44 in 08/2023. Stressed importance of adequate  hydration. Continue low-salt diet, avoidance of NSAIDs, and adequate BP and glucose control.   Senile ecchymosis Assessment & Plan: We discussed diagnosis and prognosis. Try to avoid trauma. Avoid any direct UV exposure on affected areas to decrease risk of postinflammatory pigmentation changes.   Vertigo Assessment & Plan: In regard to this problem, which has been intermittent for some time, he has not had episodes since his last visit in 10/2023. He has meclizine  on hand in case he develops episodes, we discussed some side effects. Fall precautions to continue.   Influenza vaccine needed -     Flu vaccine HIGH DOSE PF(Fluzone Trivalent)   Return in about 6 months (around 11/16/2024) for chronic problems.  Orlando Devereux [place], MD [facility]. [place] office.

## 2024-05-19 NOTE — Patient Instructions (Addendum)
 A few things to remember from today's visit:  Type 2 diabetes mellitus with stage 3a chronic kidney disease, without long-term current use of insulin (HCC) - Plan: POC HgB A1c, Microalbumin / creatinine urine ratio  Primary hypertension - Plan: Basic metabolic panel with GFR  Stage 3a chronic kidney disease (HCC)  Senile ecchymosis  No changes today. Monitor blood pressure.  If you need refills for medications you take chronically, please call your pharmacy. Do not use My Chart to request refills or for acute issues that need immediate attention. If you send a my chart message, it may take a few days to be addressed, specially if I am not in the office.  Please be sure medication list is accurate. If a new problem present, please set up appointment sooner than planned today.

## 2024-05-20 ENCOUNTER — Ambulatory Visit: Payer: Self-pay | Admitting: Family Medicine

## 2024-05-20 MED ORDER — SEMAGLUTIDE (1 MG/DOSE) 4 MG/3ML ~~LOC~~ SOPN: 1.0000 mg | PEN_INJECTOR | SUBCUTANEOUS | 2 refills | Status: DC

## 2024-05-20 NOTE — Assessment & Plan Note (Signed)
 In regard to this problem, which has been intermittent for some time, he has not had episodes since his last visit in 10/2023. He has meclizine  on hand in case he develops episodes, we discussed some side effects. Fall precautions to continue.

## 2024-05-23 DIAGNOSIS — D2271 Melanocytic nevi of right lower limb, including hip: Secondary | ICD-10-CM | POA: Diagnosis not present

## 2024-05-23 DIAGNOSIS — D2261 Melanocytic nevi of right upper limb, including shoulder: Secondary | ICD-10-CM | POA: Diagnosis not present

## 2024-05-23 DIAGNOSIS — C44311 Basal cell carcinoma of skin of nose: Secondary | ICD-10-CM | POA: Diagnosis not present

## 2024-05-23 DIAGNOSIS — L570 Actinic keratosis: Secondary | ICD-10-CM | POA: Diagnosis not present

## 2024-05-23 DIAGNOSIS — L814 Other melanin hyperpigmentation: Secondary | ICD-10-CM | POA: Diagnosis not present

## 2024-05-23 DIAGNOSIS — D1801 Hemangioma of skin and subcutaneous tissue: Secondary | ICD-10-CM | POA: Diagnosis not present

## 2024-05-23 DIAGNOSIS — D692 Other nonthrombocytopenic purpura: Secondary | ICD-10-CM | POA: Diagnosis not present

## 2024-05-23 DIAGNOSIS — L821 Other seborrheic keratosis: Secondary | ICD-10-CM | POA: Diagnosis not present

## 2024-05-23 DIAGNOSIS — C44319 Basal cell carcinoma of skin of other parts of face: Secondary | ICD-10-CM | POA: Diagnosis not present

## 2024-05-23 DIAGNOSIS — Z85828 Personal history of other malignant neoplasm of skin: Secondary | ICD-10-CM | POA: Diagnosis not present

## 2024-05-24 ENCOUNTER — Encounter: Payer: Self-pay | Admitting: Family Medicine

## 2024-06-08 DIAGNOSIS — M270 Developmental disorders of jaws: Secondary | ICD-10-CM | POA: Diagnosis not present

## 2024-06-20 ENCOUNTER — Telehealth: Payer: Self-pay

## 2024-06-20 NOTE — Progress Notes (Signed)
   06/20/2024  Patient ID: Lucas Warren, male   DOB: 1945-10-17, 78 y.o.   MRN: [id number]  Contacted patient to notify that Ozempic  PAP program ending for medicare patients. Discussed that he will have to get through insurance from the pharmacy to continue on med in 2026.  Scheduled f/u for 09/11/24 to assess ability to continue on medication.  [id number]

## 2024-06-29 DIAGNOSIS — C44311 Basal cell carcinoma of skin of nose: Secondary | ICD-10-CM | POA: Diagnosis not present

## 2024-06-29 DIAGNOSIS — C44319 Basal cell carcinoma of skin of other parts of face: Secondary | ICD-10-CM | POA: Diagnosis not present

## 2024-08-17 ENCOUNTER — Ambulatory Visit (INDEPENDENT_AMBULATORY_CARE_PROVIDER_SITE_OTHER): Admitting: Otolaryngology

## 2024-08-17 VITALS — BP 128/70 | HR 76 | Ht 72.0 in | Wt 190.0 lb

## 2024-08-17 DIAGNOSIS — J383 Other diseases of vocal cords: Secondary | ICD-10-CM

## 2024-08-17 DIAGNOSIS — R0982 Postnasal drip: Secondary | ICD-10-CM | POA: Diagnosis not present

## 2024-08-17 DIAGNOSIS — R490 Dysphonia: Secondary | ICD-10-CM

## 2024-08-17 DIAGNOSIS — J300 Vasomotor rhinitis: Secondary | ICD-10-CM | POA: Diagnosis not present

## 2024-08-17 DIAGNOSIS — Z9889 Other specified postprocedural states: Secondary | ICD-10-CM

## 2024-08-17 MED ORDER — IPRATROPIUM BROMIDE 0.06 % NA SOLN: NASAL | 5 refills | Status: AC

## 2024-08-17 NOTE — Patient Instructions (Addendum)
°  Use this spray multiple times per dya for 6 weeks. Otherwise use atrovent  spray as needed up to 4 times per day.   Can use in combination with the atrovent  spray up to 4 times per day

## 2024-08-17 NOTE — Progress Notes (Signed)
 Dear Dr. Jordan, Here is my assessment for our mutual patient, Lucas Warren. Thank you for allowing me the opportunity to care for your patient. Please do not hesitate to contact me should you have any other questions. Sincerely, Dr. Eldora Blanch  Otolaryngology Clinic Note Referring provider: Dr. Jordan HPI:  Lucas Warren is a 78 y.o. male kindly referred by Dr. Jordan for evaluation of sinonasal issues.  Initial visit (08/2023): Lucas Warren reports that Lucas Warren primarily has a nasal dripping problem. Lucas Warren reports that Lucas Warren has bilateral mucoid rhinorrhea which is persistent, significantly worsening with eating (any kind of food, maybe worse with spicy food but has not specifically noticed). Lucas Warren reports that it has been doing this for several years. Lucas Warren saw Dr. Roark in 2020, but did not help with nasacort or allegra. Lucas Warren reports that it is embarrassing. Lucas Warren has tried Xyzal , Astelin , Nasacort, Flonase , Nasal saline spray, and atrovent  0.06%. Lucas Warren has not tried everything at once. Nothing really works.  No headaches, not induced by head tilt or valsalva  Had vertigo but recovered. No hearing loss or fullness, or drainage. Suspected BPPV, did vestibular Rx and now resolved.  Lucas Warren also reports some hoarseness which has been ongoing for a few weeks. Did have URI few weeks ago. Denies obvious GERD Sx. Worse at end of day. No cough Patient otherwise denies: - dysphagia, odynophagia, aspiration episodes or PNA, need for Heimlich, unintentional weight loss - changes in voice, shortness of breath, hemoptysis - ear pain, neck masses  Lucas Warren does not report frequent sinus infections or CRS symptoms including pain/pressure, discolored drainage, congestion, discolored drainage.  Lucas Warren has had prior allergy  testing and CT. --------------------------------------------------------- 10/14/2023 Doing better on sprays initially, esp with atrovent . Dripping was much better, but then had some blood tinged drainage so stopped. Dripping  re-started afterwards. This continues to bother him significantly. Voice improved  --------------------------------------------------------- 11/19/2023 Doing better on sprays but still very bothersome. We discussed options and Lucas Warren is quite frustrated and Lucas Warren does wish to proceed with Clarifix. --------------------------------------------------------- 02/14/2024 Doing significantly better after Clarifix. Lucas Warren has stopped using sprays, and despite that, Lucas Warren reports at least 50% improvement on his dripping. No nasal pain, drainage, facial pressure.  Lucas Warren reports his recovery went well. Voice improved. --------------------------------------------------------- 08/17/2024 Lucas Warren is doing some better compared to before but still having some issues. No pain or drainage or pressure. Using atrovent .   PMHx: T2DM, GERD, CKD, HLD  H&N Surgery: Sinus surgery for deviated septum (done several years ago in [place]) Personal or FHx of bleeding dz or anesthesia difficulty: no  AP/AC: ASA 81 once per week GLP-1: on ozempic  for DM  Tobacco: no. Lives in [place], [place]  Independent Review of Additional Tests or Records:  Dr. Micheal (06/10/2022): vertigo - woke up with spinning, some nausea and 1 episode of vomiting; no headaches, hearing change, URI sx, no other sx. Meclizine  helped. Suspected BPPV, noted horizontal nystagmus; Discussed epley, Meclizine  PRN ED Notes Dr. Geroldine (06/22/2023): Vertigo for 3 weeks, mostly when waking up and changing position; no other sx; CT done, Rx meclizine  Vestibular rehab 07/13/2023: VOR noted normal, Left HIT covert positive; R right hallpike few beats R upbeating; Dx likely L posterior BPPV; now Asx, discharged Dr. Jordan 08/16/2023: vertigo hsa resolved. Using epley; now just complains of rhinorrhea in mornings or after Lucas Warren eats; no congestion just PND; tried zyrtec, flonase , atrovent , irrigations, nthing helped. Reports negative allergy  test. Two prior nasal surgery [facility]  06/22/2023: reviewed independently, prior b/l max and loks like at  least anterior ethmoidectomy; no significant opacification; mastoids and ME well aerated; given cut thickness can't assess superior SCC well but no noted otic capsule abnormality Dr. Roark (06/2019): post nasal drip for a while, tried OTC meds and when Lucas Warren eats. Rx: allegra, nasacort, suspected vasomotor rhinitis. Dr. Jeneal (Allergy  05/06/2022) - for rhinitis, tried atrovent  0.06% but using twice per; tested for allergies, did not work significantly. Allergy  testing 05/29/2022: positive for grass pollen, tree pollen, dust mite  PMH/Meds/All/SocHx/FamHx/ROS:   Past Medical History:  Diagnosis Date   Acute URI 10/10/2012   Allergy     seasonal   Anxiety    Cataract    Chest pain, atypical    Chronic kidney disease    Colon polyps    hyperplastic 2003   DJD (degenerative joint disease)    DM (diabetes mellitus) (HCC)    GERD (gastroesophageal reflux disease)    History of headache    Hypercholesteremia    Psoriasis    Vertigo      Past Surgical History:  Procedure Laterality Date   CATARACT EXTRACTION, BILATERAL Bilateral    Dr. Milan;    COLONOSCOPY     INGUINAL HERNIA REPAIR  1989   LEFT HEART CATH AND CORONARY ANGIOGRAPHY N/A 05/20/2020   Procedure: LEFT HEART CATH AND CORONARY ANGIOGRAPHY;  Surgeon: Dann Candyce RAMAN, MD;  Location: [facility];  Service: Cardiovascular;  Laterality: N/A;   NASAL SEPTUM SURGERY  1989   SINOSCOPY     TONSILLECTOMY      Family History  Problem Relation Age of Onset   Lung cancer Mother    Alzheimer's disease Father    Heart disease Father    Leukemia Sister    Allergic rhinitis Neg Hx    Asthma Neg Hx    Eczema Neg Hx    Urticaria Neg Hx    Colon cancer Neg Hx    Colon polyps Neg Hx    Esophageal cancer Neg Hx    Rectal cancer Neg Hx    Stomach cancer Neg Hx      Social Connections: Moderately Integrated (05/08/2024)   Social Connection and Isolation  Panel    Frequency of Communication with Friends and Family: More than three times a week    Frequency of Social Gatherings with Friends and Family: More than three times a week    Attends Religious Services: 1 to 4 times per year    Active Member of [employer] or Organizations: Yes    Attends Engineer, Structural: More than 4 times per year    Marital Status: Divorced      Current Outpatient Medications:    amoxicillin -clavulanate (AUGMENTIN ) 875-125 MG tablet, Oral, Disp: , Rfl:    aspirin  81 MG tablet, Take 1 tablet by mouth once a week., Disp: , Rfl:    azelastine  (ASTELIN ) 0.1 % nasal spray, Place 2 sprays into both nostrils 2 (two) times daily. Use in each nostril as directed, Disp: 30 mL, Rfl: 12   cephALEXin (KEFLEX) 500 MG capsule, Oral, Disp: , Rfl:    empagliflozin  (JARDIANCE ) 25 MG TABS tablet, Take 1 tablet (25 mg total) by mouth daily., Disp: 90 tablet, Rfl: 3   glimepiride  (AMARYL ) 4 MG tablet, Oral, Disp: , Rfl:    glipiZIDE  (GLUCOTROL ) 5 MG tablet, Take 1 tablet (5 mg total) by mouth daily before breakfast., Disp: 90 tablet, Rfl: 1   glucose blood (ONETOUCH ULTRA TEST) test strip, USE ti test blood sugar TWICE DAILY AS DIRECTED, Disp:  200 strip, Rfl: 3   Lancets (ONETOUCH DELICA PLUS LANCET33G) MISC, USE TO test blood sugar TWICE DAILY, Disp: 420 each, Rfl: 2   lisinopril  (ZESTRIL ) 10 MG tablet, TAKE ONE TABLET (10mg  total) BY MOUTH EVERY IN THE EVENING, Disp: 90 tablet, Rfl: 1   metFORMIN  (GLUCOPHAGE ) 1000 MG tablet, Take 1 tablet (1,000 mg total) by mouth 2 (two) times daily with a meal., Disp: 180 tablet, Rfl: 1   pioglitazone  (ACTOS ) 45 MG tablet, TAKE ONE TABLET BY MOUTH EVERY MORNING, Disp: 90 tablet, Rfl: 1   pravastatin  (PRAVACHOL ) 20 MG tablet, Oral, Disp: , Rfl:    rosuvastatin  (CRESTOR ) 20 MG tablet, Take 1 tablet (20 mg total) by mouth daily., Disp: 90 tablet, Rfl: 2   Semaglutide , 1 MG/DOSE, 4 MG/3ML SOPN, Inject 1 mg as directed once a week., Disp: 3 mL,  Rfl: 2   sitaGLIPtin  (JANUVIA ) 100 MG tablet, Oral, Disp: , Rfl:    vitamin B-12 (CYANOCOBALAMIN ) 1000 MCG tablet, Take 1,000 mcg by mouth at bedtime. , Disp: , Rfl:    acetaminophen  (TYLENOL ) 500 MG tablet, Take 2 tablets (1,000 mg total) by mouth every 6 (six) hours as needed. (Patient not taking: Reported on 08/17/2024), Disp: 30 tablet, Rfl: 0   ibuprofen  (ADVIL ) 200 MG tablet, Take 2 tablets (400 mg total) by mouth every 6 (six) hours as needed. (Patient not taking: Reported on 08/17/2024), Disp: 30 tablet, Rfl: 0   ipratropium (ATROVENT ) 0.06 % nasal spray, Place 2 sprays each nostril up to 4 times a day for symptoms relief, Disp: 15 mL, Rfl: 5   levocetirizine (XYZAL ) 5 MG tablet, TAKE 1 TABLET BY MOUTH EACH EVENING (Patient not taking: Reported on 08/17/2024), Disp: 30 tablet, Rfl: 10   meclizine  (ANTIVERT ) 25 MG tablet, Take 1 tablet (25 mg total) by mouth at bedtime as needed for dizziness. (Patient not taking: Reported on 08/17/2024), Disp: 20 tablet, Rfl: 0   nitroGLYCERIN  (NITROSTAT ) 0.4 MG SL tablet, Take one tablet sublingual as needed for chest pain up to three doses as needed. (Patient not taking: Reported on 08/17/2024), Disp: 20 tablet, Rfl: 0   ondansetron  (ZOFRAN -ODT) 8 MG disintegrating tablet, PLACE ONE TABLET ON TONGUE AND ALLOW TO DISSOLVE EVERY FOUR HOURS AS NEEDED FOR nausea (Patient not taking: Reported on 08/17/2024), Disp: 12 tablet, Rfl: 0   tadalafil  (CIALIS ) 20 MG tablet, TAKE ONE TABLET BY MOUTH every other DAY AS NEEDED (Patient not taking: Reported on 08/17/2024), Disp: 30 tablet, Rfl: 3   Physical Exam:   BP 128/70 (BP Location: Left Arm, Patient Position: Sitting, Cuff Size: Large)   Pulse 76   Ht 6' (1.829 m)   Wt 190 lb (86.2 kg)   BMI 25.77 kg/m   Salient findings:  CN II-XII intact Anterior rhinoscopy: Septum midline; bilateral inferior turbinates with postsurgical changes and reduced Head tilt negative No respiratory distress or stridor; voice  quality class 2   Seprately Identifiable Procedures:  Prior to proceeding, R/B/A for procedure were discussed and verbal consent was obtained PROCEDURE (Prior, not today): Bilateral Diagnostic Rigid Nasal Endoscopy with Debridement Pre-procedure diagnosis: Vasomotor rhinitis, status post posterior nasal nerve ablation Post-procedure diagnosis: same Indication: See pre-procedure diagnosis and physical exam above Complications: None apparent EBL: 0 mL Anesthesia: Lidocaine  4% and topical decongestant was topically sprayed in each nasal cavity  Description of Procedure:    Patient was identified. A rigid 30 degree endoscope was utilized to evaluate the sinonasal cavities, mucosa, sinus ostia and turbinates and septum.  Overall, signs  of mucosal inflammation are noted, mild.  Also noted are post-surgical changes - bilateral turbinates resected partially; max and ethmoids patent bilaterally with crusting lateral nasal sidewall around middle turbinate stumps with right partial middle turbinectomy. These were debrided with 8 fr suction, with improvement in patency.  No mucopurulence, polyps, or masses noted.  No evidence of active CSF leak on valsalva Right Middle meatus: clear Right SE Recess: clear Left MM: clear Left SE Recess: clear  CPT CODE -- 31237 - Mod 25, 50  Impression & Plans:  Guage Efferson is a 78 y.o. male with:  1. Vasomotor rhinitis   2. S/P FESS (functional endoscopic sinus surgery)   3. Post-nasal drip   4. Dysphonia   5. Vocal fold atrophy    Primary complaint is really rhinitis with eating, though it is occurring at all the time. Improved on sprays but not enough benefit so underwent b/l Clarifix 01/10/2024. Lucas Warren is doing much better afterwards but now has recurred. Atrovent  helps. We discussed repeat clarifix since Lucas Warren had benefit prior but Lucas Warren opted for observation. Will also trial on capsaicin spray QID  - Would rec continuing atrovent  0.06% and increase to QID -  Capsaicin spray QID  Dysphonia: query LPR with contribution from atrophy - improved, will observe   Thank you for allowing me the opportunity to care for your patient. Please do not hesitate to contact me should you have any other questions.  Sincerely, Eldora Blanch, MD Otolaryngologist (ENT), [facility] Phone: [phone] Fax: [phone]  08/17/2024, 1:25 PM   MDM:  Level 3: 99214 Complexity/Problems addressed: mod - chronic problem with exacerbation Data complexity: low - Morbidity: mod  - Prescription Drug prescribed or managed: y

## 2024-08-29 ENCOUNTER — Other Ambulatory Visit: Payer: Self-pay | Admitting: Family Medicine

## 2024-09-11 ENCOUNTER — Other Ambulatory Visit

## 2024-09-11 DIAGNOSIS — E1122 Type 2 diabetes mellitus with diabetic chronic kidney disease: Secondary | ICD-10-CM

## 2024-09-11 DIAGNOSIS — N1831 Type 2 diabetes mellitus with diabetic chronic kidney disease: Secondary | ICD-10-CM

## 2024-09-11 MED ORDER — SEMAGLUTIDE (1 MG/DOSE) 4 MG/3ML ~~LOC~~ SOPN: 1.0000 mg | PEN_INJECTOR | SUBCUTANEOUS | 2 refills | Status: AC

## 2024-09-11 NOTE — Progress Notes (Signed)
" ° °  09/11/2024  Patient ID: Lucas Warren, male   DOB: [birth year], 79 y.o.   MRN: [id number]  Contacted patient via telephone to follow up on Ozempic  access for 2026.  Patient is aware Ozempic  PAP program with [facility] ended as of 08/31/24 and in order to stay on medication, would need to get from pharmacy.  Confirmed he has [employer] Gold Plus plan for 2026, reviewed projected cost of Ozempic . Patient reports this should be affordable and would like an rx sent to pharmacy, will coordinate with PCP.  Confirmed BG still looking well controlled at home.  Jon VEAR Lindau, PharmD Clinical Pharmacist [phone]  "

## 2024-09-13 ENCOUNTER — Other Ambulatory Visit (HOSPITAL_COMMUNITY): Payer: Self-pay

## 2024-09-13 ENCOUNTER — Telehealth: Payer: Self-pay

## 2024-09-13 NOTE — Telephone Encounter (Signed)
 Pharmacy Patient Advocate Encounter  Received notification from [employer] that Prior Authorization for Ozempic  4 has been APPROVED from 08/31/24 to 08/30/25. Ran test claim, Copay is $297.00. This test claim was processed through [facility]- copay amounts may vary at other pharmacies due to pharmacy/plan contracts, or as the patient moves through the different stages of their insurance plan.   PA #/Case ID/Reference #: # [id number]

## 2024-09-13 NOTE — Telephone Encounter (Signed)
 Pharmacy Patient Advocate Encounter   Received notification from [facility] that prior authorization for Ozempic  4 is required/requested.   Insurance verification completed.   The patient is insured through [place].   Per test claim: PA required; PA submitted to above mentioned insurance via Latent Key/confirmation #/EOC [id number] Status is pending

## 2024-11-17 ENCOUNTER — Ambulatory Visit: Admitting: Family Medicine

## 2024-12-11 ENCOUNTER — Other Ambulatory Visit
